# Patient Record
Sex: Female | Born: 1965 | Race: White | Hispanic: No | Marital: Married | Smoking: Former smoker
Health system: Southern US, Community
[De-identification: ages and names within clinical notes are randomized; demographics above are authoritative.]

## PROBLEM LIST (undated history)

## (undated) DIAGNOSIS — I499 Cardiac arrhythmia, unspecified: Secondary | ICD-10-CM

## (undated) DIAGNOSIS — Z87442 Personal history of urinary calculi: Secondary | ICD-10-CM

## (undated) DIAGNOSIS — K449 Diaphragmatic hernia without obstruction or gangrene: Secondary | ICD-10-CM

## (undated) DIAGNOSIS — F3289 Other specified depressive episodes: Secondary | ICD-10-CM

## (undated) DIAGNOSIS — F329 Major depressive disorder, single episode, unspecified: Secondary | ICD-10-CM

## (undated) DIAGNOSIS — K209 Esophagitis, unspecified: Secondary | ICD-10-CM

## (undated) DIAGNOSIS — I10 Essential (primary) hypertension: Secondary | ICD-10-CM

## (undated) DIAGNOSIS — E7800 Pure hypercholesterolemia, unspecified: Secondary | ICD-10-CM

## (undated) DIAGNOSIS — IMO0002 Reserved for concepts with insufficient information to code with codable children: Secondary | ICD-10-CM

## (undated) DIAGNOSIS — K5730 Diverticulosis of large intestine without perforation or abscess without bleeding: Secondary | ICD-10-CM

## (undated) DIAGNOSIS — K219 Gastro-esophageal reflux disease without esophagitis: Secondary | ICD-10-CM

## (undated) DIAGNOSIS — C50919 Malignant neoplasm of unspecified site of unspecified female breast: Secondary | ICD-10-CM

## (undated) DIAGNOSIS — L659 Nonscarring hair loss, unspecified: Secondary | ICD-10-CM

## (undated) DIAGNOSIS — D649 Anemia, unspecified: Secondary | ICD-10-CM

## (undated) DIAGNOSIS — F1111 Opioid abuse, in remission: Secondary | ICD-10-CM

## (undated) DIAGNOSIS — F411 Generalized anxiety disorder: Secondary | ICD-10-CM

## (undated) DIAGNOSIS — G43909 Migraine, unspecified, not intractable, without status migrainosus: Secondary | ICD-10-CM

## (undated) HISTORY — DX: Nonscarring hair loss, unspecified: L65.9

## (undated) HISTORY — DX: Diverticulosis of large intestine without perforation or abscess without bleeding: K57.30

## (undated) HISTORY — DX: Esophagitis, unspecified: K20.9

## (undated) HISTORY — DX: Anemia, unspecified: D64.9

## (undated) HISTORY — PX: TONSILLECTOMY: SUR1361

## (undated) HISTORY — DX: Diaphragmatic hernia without obstruction or gangrene: K44.9

## (undated) HISTORY — DX: Major depressive disorder, single episode, unspecified: F32.9

## (undated) HISTORY — DX: Generalized anxiety disorder: F41.1

## (undated) HISTORY — DX: Other specified depressive episodes: F32.89

## (undated) HISTORY — DX: Gastro-esophageal reflux disease without esophagitis: K21.9

## (undated) HISTORY — PX: KNEE SURGERY: SHX244

## (undated) HISTORY — DX: Opioid abuse, in remission: F11.11

## (undated) HISTORY — PX: ABLATION: SHX5711

## (undated) SURGERY — Surgical Case
Anesthesia: *Unknown

---
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
## 1996-04-10 DIAGNOSIS — K449 Diaphragmatic hernia without obstruction or gangrene: Secondary | ICD-10-CM

## 1996-04-10 DIAGNOSIS — K209 Esophagitis, unspecified without bleeding: Secondary | ICD-10-CM

## 1996-04-10 HISTORY — DX: Diaphragmatic hernia without obstruction or gangrene: K44.9

## 1996-04-10 HISTORY — DX: Esophagitis, unspecified without bleeding: K20.90

## 1999-07-04 ENCOUNTER — Encounter (INDEPENDENT_AMBULATORY_CARE_PROVIDER_SITE_OTHER): Payer: Self-pay

## 1999-07-04 ENCOUNTER — Other Ambulatory Visit: Admission: RE | Admit: 1999-07-04 | Discharge: 1999-07-04 | Payer: Self-pay | Admitting: Gastroenterology

## 2007-10-11 ENCOUNTER — Emergency Department (HOSPITAL_COMMUNITY): Admission: EM | Admit: 2007-10-11 | Discharge: 2007-10-11 | Payer: Self-pay | Admitting: Emergency Medicine

## 2008-12-07 ENCOUNTER — Ambulatory Visit (HOSPITAL_COMMUNITY): Admission: RE | Admit: 2008-12-07 | Discharge: 2008-12-07 | Payer: Self-pay | Admitting: Internal Medicine

## 2009-04-10 HISTORY — PX: CARDIAC CATHETERIZATION: SHX172

## 2009-05-13 ENCOUNTER — Encounter (INDEPENDENT_AMBULATORY_CARE_PROVIDER_SITE_OTHER): Payer: Self-pay | Admitting: *Deleted

## 2009-06-15 ENCOUNTER — Encounter (INDEPENDENT_AMBULATORY_CARE_PROVIDER_SITE_OTHER): Payer: Self-pay | Admitting: *Deleted

## 2009-06-15 ENCOUNTER — Ambulatory Visit: Payer: Self-pay | Admitting: Gastroenterology

## 2009-06-15 DIAGNOSIS — K589 Irritable bowel syndrome without diarrhea: Secondary | ICD-10-CM | POA: Insufficient documentation

## 2009-06-15 DIAGNOSIS — K219 Gastro-esophageal reflux disease without esophagitis: Secondary | ICD-10-CM | POA: Insufficient documentation

## 2009-06-15 DIAGNOSIS — F329 Major depressive disorder, single episode, unspecified: Secondary | ICD-10-CM | POA: Insufficient documentation

## 2009-06-15 DIAGNOSIS — K625 Hemorrhage of anus and rectum: Secondary | ICD-10-CM | POA: Insufficient documentation

## 2009-06-15 LAB — CONVERTED CEMR LAB: Tissue Transglutaminase Ab, IgA: 0.2 units (ref <7)

## 2009-06-16 LAB — CONVERTED CEMR LAB
ALT: 16 units/L (ref 0–35)
AST: 23 units/L (ref 0–37)
Albumin: 4.5 g/dL (ref 3.5–5.2)
Alkaline Phosphatase: 55 units/L (ref 39–117)
Basophils Absolute: 0 10*3/uL (ref 0.0–0.1)
Basophils Relative: 0 % (ref 0.0–3.0)
Bilirubin, Direct: 0.1 mg/dL (ref 0.0–0.3)
Eosinophils Absolute: 0.1 10*3/uL (ref 0.0–0.7)
Eosinophils Relative: 0.8 % (ref 0.0–5.0)
Ferritin: 21.1 ng/mL (ref 10.0–291.0)
Folate: 19.5 ng/mL
HCT: 43.6 % (ref 36.0–46.0)
Hemoglobin: 14.7 g/dL (ref 12.0–15.0)
IgA: 200 mg/dL (ref 68–378)
Iron: 55 ug/dL (ref 42–145)
Lymphocytes Relative: 22.4 % (ref 12.0–46.0)
Lymphs Abs: 2.6 10*3/uL (ref 0.7–4.0)
MCHC: 33.6 g/dL (ref 30.0–36.0)
MCV: 95.1 fL (ref 78.0–100.0)
Monocytes Absolute: 0.5 10*3/uL (ref 0.1–1.0)
Monocytes Relative: 4.7 % (ref 3.0–12.0)
Neutro Abs: 8.4 10*3/uL — ABNORMAL HIGH (ref 1.4–7.7)
Neutrophils Relative %: 72.1 % (ref 43.0–77.0)
Platelets: 260 10*3/uL (ref 150.0–400.0)
RBC: 4.59 M/uL (ref 3.87–5.11)
RDW: 12.3 % (ref 11.5–14.6)
Saturation Ratios: 12.2 % — ABNORMAL LOW (ref 20.0–50.0)
TSH: 1.6 microintl units/mL (ref 0.35–5.50)
Total Bilirubin: 0.4 mg/dL (ref 0.3–1.2)
Total Protein: 8 g/dL (ref 6.0–8.3)
Transferrin: 322.8 mg/dL (ref 212.0–360.0)
Vitamin B-12: 706 pg/mL (ref 211–911)
WBC: 11.6 10*3/uL — ABNORMAL HIGH (ref 4.5–10.5)

## 2009-06-24 ENCOUNTER — Telehealth: Payer: Self-pay | Admitting: Gastroenterology

## 2009-07-02 ENCOUNTER — Telehealth: Payer: Self-pay | Admitting: Gastroenterology

## 2009-07-02 ENCOUNTER — Ambulatory Visit: Payer: Self-pay | Admitting: Gastroenterology

## 2009-07-06 ENCOUNTER — Encounter: Payer: Self-pay | Admitting: Gastroenterology

## 2009-07-21 ENCOUNTER — Telehealth: Payer: Self-pay | Admitting: Gastroenterology

## 2009-08-02 ENCOUNTER — Encounter: Admission: RE | Admit: 2009-08-02 | Discharge: 2009-08-02 | Payer: Self-pay | Admitting: Obstetrics and Gynecology

## 2009-09-14 ENCOUNTER — Telehealth: Payer: Self-pay | Admitting: Gastroenterology

## 2009-09-22 ENCOUNTER — Emergency Department (HOSPITAL_COMMUNITY): Admission: EM | Admit: 2009-09-22 | Discharge: 2009-09-22 | Payer: Self-pay | Admitting: Emergency Medicine

## 2009-10-18 ENCOUNTER — Telehealth: Payer: Self-pay | Admitting: Gastroenterology

## 2009-10-20 DIAGNOSIS — D509 Iron deficiency anemia, unspecified: Secondary | ICD-10-CM | POA: Insufficient documentation

## 2009-10-22 ENCOUNTER — Encounter: Payer: Self-pay | Admitting: Gastroenterology

## 2009-10-22 ENCOUNTER — Ambulatory Visit (HOSPITAL_COMMUNITY): Admission: RE | Admit: 2009-10-22 | Discharge: 2009-10-22 | Payer: Self-pay | Admitting: Family Medicine

## 2009-10-29 ENCOUNTER — Telehealth: Payer: Self-pay | Admitting: Gastroenterology

## 2009-11-30 ENCOUNTER — Telehealth: Payer: Self-pay | Admitting: Gastroenterology

## 2009-12-01 ENCOUNTER — Telehealth: Payer: Self-pay | Admitting: Gastroenterology

## 2010-03-17 ENCOUNTER — Observation Stay (HOSPITAL_COMMUNITY): Admission: EM | Admit: 2010-03-17 | Discharge: 2010-02-17 | Payer: Self-pay | Admitting: Cardiovascular Disease

## 2010-04-07 ENCOUNTER — Telehealth: Payer: Self-pay | Admitting: Gastroenterology

## 2010-05-01 ENCOUNTER — Encounter: Payer: Self-pay | Admitting: Obstetrics and Gynecology

## 2010-05-10 NOTE — Procedures (Signed)
Summary: Colonoscopy  Patient: [name] Note: All result statuses are Final unless otherwise noted.  Tests: (1) Colonoscopy (COL)   COL Colonoscopy           DONE     [facility]     [address]     [place], [place]  [address]           COLONOSCOPY PROCEDURE REPORT           PATIENT:  Jasmine Allen, Jasmine Allen  MR#:  [id number]     BIRTHDATE:  April 10, 1966, 43 yrs. old  GENDER:  female     ENDOSCOPIST:  Vania Rea. Jarold Motto, MD, [facility]     REF. BY:     PROCEDURE DATE:  07/02/2009     PROCEDURE:  Colonoscopy with biopsy     ASA CLASS:  Class II     INDICATIONS:  unexplained diarrhea     MEDICATIONS:   Fentanyl 100 mcg IV, Versed 13 mg IV, Benadryl 25     mg IV           DESCRIPTION OF PROCEDURE:   After the risks benefits and     alternatives of the procedure were thoroughly explained, informed     consent was obtained.  Digital rectal exam was performed and     revealed no abnormalities.   The LB [id number] endoscope     was introduced through the anus and advanced to the terminal ileum     which was intubated for a short distance, IN FUTURE WOULD NEED     PROPOFOL FOR IV SEDATION.  The quality of the prep was excellent,     using MoviPrep.  The instrument was then slowly withdrawn as the     colon was fully examined.     <<PROCEDUREIMAGES>>           FINDINGS:  No polyps or cancers were seen.  This was otherwise a     normal examination of the colon. random biopsies done.     Retroflexed views in the rectum revealed no abnormalities.    The     scope was then withdrawn from the patient and the procedure     completed.           COMPLICATIONS:  None     ENDOSCOPIC IMPRESSION:     1) No polyps or cancers     2) Otherwise normal examination     PROBABLE IBS.     RECOMMENDATIONS:     1) Await biopsy results     LIBRAX 1 TID,AC.#100 REFILL X 6.     REPEAT EXAM:  No           ______________________________     Vania Rea. Jarold Motto, MD, [facility] Graham           CC:  Patrica Duel, MD           n.     Rosalie Doctor:   Vania Rea. Patterson at 07/02/2009 03:19 PM           Anitra Lauth, [id number]  Note: An exclamation mark (!) indicates a result that was not dispersed into the flowsheet. Document Creation Date: 07/02/2009 3:20 PM _______________________________________________________________________  (1) Order result status: Final Collection or observation date-time: 07/02/2009 15:12 Requested date-time:  Receipt date-time:  Reported date-time:  Referring Physician:   Ordering Physician: Sheryn Bison [phone]) Specimen Source:  Source: Launa Grill Order Number: [id number] Lab site:

## 2010-05-10 NOTE — Progress Notes (Signed)
Summary: ? re labs  Phone Note Call from Patient Call back at [phone] cell   Caller: Patient Call For: Jarold Motto Reason for Call: Talk to Nurse Summary of Call: Patient has questions on when she should have labs done Initial call taken by: Tawni Levy,  July 21, 2009 2:02 PM  Follow-up for Phone Call        Pt on tandem for 3 mo.  Started on 06/15/09.  See lab report.  Asking when she should repeat lab? Follow-up by: Ashok Cordia RN,  July 21, 2009 3:04 PM  Additional Follow-up for Phone Call Additional follow up Details #1::        CONTINUE 3 MORE MOS AND THEN REPEAT CBC AND IRON LEVELS... Additional Follow-up by: Mardella Layman MD FACG,  July 21, 2009 3:11 PM    Additional Follow-up for Phone Call Additional follow up Details #2::    LM for pt to call.  Lupita Leash Surface RN  July 21, 2009 3:14 PM  Pt notified.  Did not start iron until end of March.  will come in July for repeat level. APpt in IDX. Follow-up by: Ashok Cordia RN,  July 21, 2009 3:33 PM

## 2010-05-10 NOTE — Miscellaneous (Signed)
Summary: librax rx.  Clinical Lists Changes  Medications: Added new medication of LIBRAX 2.5-5 MG  CAPS (CLIDINIUM-CHLORDIAZEPOXIDE) take one tab three tmes a day, ac. - Signed Rx of LIBRAX 2.5-5 MG  CAPS (CLIDINIUM-CHLORDIAZEPOXIDE) take one tab three tmes a day, ac.;  #100 x 6;  Signed;  Entered by: Darlyn Read RN;  Authorized by: Mardella Layman MD [facility];  Method used: Electronically to [facility] [address], [place], [place], [place]  [address], Ph: [phone], Fax: [phone]    Prescriptions: LIBRAX 2.5-5 MG  CAPS (CLIDINIUM-CHLORDIAZEPOXIDE) take one tab three tmes a day, ac.  #100 x 6   Entered by:   Darlyn Read RN   Authorized by:   Mardella Layman MD [facility]   Signed by:   Darlyn Read RN on 07/02/2009   Method used:   Electronically to        [facility] [address] (retail)       [address]       [place], [place]  [address]       Ph: [phone]       Fax: [phone]   RxID:   [id number]

## 2010-05-10 NOTE — Progress Notes (Signed)
Summary: results request  Phone Note Call from Patient Call back at [phone]   Caller: Patient Call For: Dr. Jarold Motto Reason for Call: Talk to Nurse Summary of Call: would like labwork results regarding iron levels Initial call taken by: Vallarie Mare,  October 29, 2009 9:48 AM  Follow-up for Phone Call        Pt asking if we have recieved her lab results yet.  Labs were drawn last week at [facility] in [place]. Follow-up by: Ashok Cordia RN,  October 29, 2009 2:28 PM  Additional Follow-up for Phone Call Additional follow up Details #1::        See labs.  Pt asking if she needs to cont Iron? Additional Follow-up by: Ashok Cordia RN,  October 29, 2009 4:18 PM    Additional Follow-up for Phone Call Additional follow up Details #2::    STOP Follow-up by: Mardella Layman MD [facility] Graham,  October 29, 2009 4:20 PM  Additional Follow-up for Phone Call Additional follow up Details #3:: Details for Additional Follow-up Action Taken: Pt notified.   Additional Follow-up by: Ashok Cordia RN,  October 29, 2009 4:54 PM

## 2010-05-10 NOTE — Progress Notes (Signed)
Summary: ? re prep  Phone Note Call from Patient Call back at [phone]--- [phone]   Caller: Patient Call For: Jarold Motto Reason for Call: Talk to Nurse Summary of Call: Patient has questions regarding her prep before procedure Initial call taken by: Tawni Levy,  June 24, 2009 8:09 AM  Follow-up for Phone Call        Answered pt's questions re prep. Follow-up by: Ashok Cordia RN,  June 24, 2009 9:04 AM

## 2010-05-10 NOTE — Progress Notes (Signed)
Summary: speak to nurse  Phone Note Call from Patient Call back at Home Phone [phone] Call back at cell [phone]   Caller: Patient Call For: Jasmine Allen Reason for Call: Talk to Nurse Summary of Call: Patient wants to speak to nurse regarding esophageal spasms that she's having. Initial call taken by: Tawni Levy,  September 14, 2009 8:12 AM  Follow-up for Phone Call        Pt currently taking Doxycycline for cervical inflamation.  Since starting this she is having esophageal spasms, painful swallowing.  Once before pt had similiar symptoms while taking antiobiotics ans Dr. Jarold Allen gave Levsin which was helpful.  Pt asking for Rx for levsin or what ever Dr. Jarold Allen feels would be best.  She is to take Doxy for 10 days total. Follow-up by: Ashok Cordia RN,  September 14, 2009 8:31 AM  Additional Follow-up for Phone Call Additional follow up Details #1::        Pt notified. Additional Follow-up by: Ashok Cordia RN,  September 14, 2009 3:36 PM    Additional Follow-up for Phone Call Additional follow up Details #2::    levsin ok and Dukes mmw tid Follow-up by: Mardella Layman MD [facility],  September 14, 2009 1:13 PM  New/Updated Medications: LEVSIN/SL 0.125 MG  SUBL (HYOSCYAMINE SULFATE) 1 sl q 4-6 hrs as needed FIRST-BXN MOUTHWASH  SUSP (DIPHENHYD-LIDOCAINE-NYSTATIN) 1 tsp swish and swallow qid Prescriptions: FIRST-BXN MOUTHWASH  SUSP (DIPHENHYD-LIDOCAINE-NYSTATIN) 1 tsp swish and swallow qid  #14 oz x 1   Entered by:   Ashok Cordia RN   Authorized by:   Mardella Layman MD [facility]   Signed by:   Ashok Cordia RN on 09/14/2009   Method used:   Electronically to        [facility] [address] (retail)       [address]       [place], [place]  [address]       Ph: [phone]       Fax: [phone]   RxID:   [id number] LEVSIN/SL 0.125 MG  SUBL (HYOSCYAMINE SULFATE) 1 sl q 4-6 hrs as needed  #60 x 1   Entered by:   Ashok Cordia RN   Authorized by:    Mardella Layman MD [facility]   Signed by:   Ashok Cordia RN on 09/14/2009   Method used:   Electronically to        [facility] [address] (retail)       [address]       [place], [place]  [address]       Ph: [phone]       Fax: [phone]   RxID:   [id number]

## 2010-05-10 NOTE — Letter (Signed)
Summary: Patient Notice- Colon Biospy Results  [facility]  [address] [place], [place] [address]   Phone: [phone]  Fax: [phone]        July 06, 2009 MRN: [id number]    [facility] [address] [place], [place]  [address]    Dear Ms. BREUER,  I am pleased to inform you that the biopsies taken during your recent colonoscopy did not show any evidence of cancer upon pathologic examination.  Additional information/recommendations:  __No further action is needed at this time.  Please follow-up with      your primary care physician for your other healthcare needs.  __Please call [phone] to schedule a return visit to review      your condition.  xx__Continue with the treatment plan as outlined on the day of your      exam.  __You should have a repeat colonoscopy examination for this problem           in _ years.  Please call us if you are having persistent problems or have questions about your condition that have not been fully answered at this time.  Sincerely,  Mardella Layman MD [facility]   This letter has been electronically signed by your physician.  Appended Document: Patient Notice- Colon Biospy Results letter mailed 4.1.11

## 2010-05-10 NOTE — Letter (Signed)
Summary: [facility] Instructions  [facility]  [address] [place], [place] [address]   Phone: [phone]  Fax: [phone]       CLOVIS WARWICK    1966/01/08    MRN: [id number]        Procedure Day /Date: Friday, 07/02/09     Arrival Time: 2:00      Procedure Time: 3:00     Location of Procedure:                    [facility] (4th Floor)                          PREPARATION FOR COLONOSCOPY WITH MOVIPREP   Starting 5 days prior to your procedure 06/27/09 do not eat nuts, seeds, popcorn, corn, beans, peas,  salads, or any raw vegetables.  Do not take any fiber supplements (e.g. Metamucil, Citrucel, and Benefiber).  THE DAY BEFORE YOUR PROCEDURE         DATE: 07/01/09   DAY: Thursday  1.  Drink clear liquids the entire day-NO SOLID FOOD  2.  Do not drink anything colored red or purple.  Avoid juices with pulp.  No orange juice.  3.  Drink at least 64 oz. (8 glasses) of fluid/clear liquids during the day to prevent dehydration and help the prep work efficiently.  CLEAR LIQUIDS INCLUDE: Water Jello Ice Popsicles Tea (sugar ok, no milk/cream) Powdered fruit flavored drinks Coffee (sugar ok, no milk/cream) Gatorade Juice: apple, white grape, white cranberry  Lemonade Clear bullion, consomm, broth Carbonated beverages (any kind) Strained chicken noodle soup Hard Candy                             4.  In the morning, mix first dose of MoviPrep solution:    Empty 1 Pouch A and 1 Pouch B into the disposable container    Add lukewarm drinking water to the top line of the container. Mix to dissolve    Refrigerate (mixed solution should be used within 24 hrs)  5.  Begin drinking the prep at 5:00 p.m. The MoviPrep container is divided by 4 marks.   Every 15 minutes drink the solution down to the next mark (approximately 8 oz) until the full liter is complete.   6.  Follow completed prep with 16 oz of clear liquid of your choice (Nothing  red or purple).  Continue to drink clear liquids until bedtime.  7.  Before going to bed, mix second dose of MoviPrep solution:    Empty 1 Pouch A and 1 Pouch B into the disposable container    Add lukewarm drinking water to the top line of the container. Mix to dissolve    Refrigerate  THE DAY OF YOUR PROCEDURE      DATE: 07/02/09  DAY: Friday  Beginning at 10:00 a.m. (5 hours before procedure):         1. Every 15 minutes, drink the solution down to the next mark (approx 8 oz) until the full liter is complete.  2. Follow completed prep with 16 oz. of clear liquid of your choice.    3. You may drink clear liquids until 1:00  (2 HOURS BEFORE PROCEDURE).   MEDICATION INSTRUCTIONS  Unless otherwise instructed, you should take regular prescription medications with a small sip of water   as early as possible  the morning of your procedure.       .          OTHER INSTRUCTIONS  You will need a responsible adult at least 43 years of age to accompany you and drive you home.   This person must remain in the waiting room during your procedure.  Wear loose fitting clothing that is easily removed.  Leave jewelry and other valuables at home.  However, you may wish to bring a book to read or  an iPod/MP3 player to listen to music as you wait for your procedure to start.  Remove all body piercing jewelry and leave at home.  Total time from sign-in until discharge is approximately 2-3 hours.  You should go home directly after your procedure and rest.  You can resume normal activities the  day after your procedure.  The day of your procedure you should not:   Drive   Make legal decisions   Operate machinery   Drink alcohol   Return to work  You will receive specific instructions about eating, activities and medications before you leave.    The above instructions have been reviewed and explained to me by   _______________________    I fully understand and can  verbalize these instructions _____________________________ Date _________

## 2010-05-10 NOTE — Assessment & Plan Note (Signed)
Summary: rectal bleeding...as.   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 She now presents with irritable bowel type complaints with alternating diarrhea and constipation, gas, bloating, and intermittent rectal bleeding. She denies upper GI complaints on daily Nexium. She has long history of anxiety and depression is on Zoloft 75 mg a day and p.r.n. Klonopin. 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  She's had a symptomatic rectal bleeding unresponsive to Preparation H and Tucks pads. Family history noncontributory except for father who may have had colon polyps. She has not had previous colonoscopy. She denies any specific food intolerances. Her appetite is good and her weight is been stable.   GI Review of Systems    Reports acid reflux and  bloating.      Denies abdominal pain, belching, chest pain, dysphagia with liquids, dysphagia with solids, heartburn, loss of appetite, nausea, vomiting, vomiting blood, weight loss, and  weight gain.      Reports change in bowel habits, hemorrhoids, rectal bleeding, and  rectal pain.     Denies anal fissure, black tarry stools, constipation, diarrhea, diverticulosis, fecal incontinence, heme positive stool, irritable bowel syndrome, jaundice, light color stool, and  liver problems. Preventive Screening-Counseling & Management  Alcohol-Tobacco     Smoking Status: current      Drug Use:  no.      Current Medications (verified): 1)  Pravachol 40 Mg Tabs (Pravastatin Sodium) .... Once Daily 2)  Zoloft 50 Mg Tabs (Sertraline Hcl) .... Take 1 1/2 Tablet By Mouth Once Daily 3)  Nexium 40  Mg Cpdr (Esomeprazole Magnesium) .... Once Daily 4)  Zyrtec Allergy 10 Mg Caps (Cetirizine Hcl) .... Once Daily 5)  Hrt 0.2mg  .... Once Daily 6)  Klonopin 1 Mg Tabs (Clonazepam) .... As Needed 7)  Mobic 15 Mg Tabs (Meloxicam) .... Once Daily 8)  Anusol-Hc 25 Mg Supp (Hydrocortisone Acetate) .... As Needed  Allergies (verified): 1)  ! Reglan 2)  ! Augmentin 3)  ! Sulfa 4)  ! Erythromycin  Past History:  Past medical, surgical, family and social histories (including risk factors) reviewed for relevance to current acute and chronic problems.  Past Medical History: Anemia Anxiety Disorder Chronic Headaches Depression Esophageal Stricture GERD Hyperlipidemia Hypertension Kidney Stones Pneumonia  Past Surgical History: Tonsillectomy  Family History: Reviewed history and no changes required. Family History of Breast Cancer:Mother Family History of Heart Disease: Father  Social History: Reviewed history and no changes required. Occupation: CMA Patient currently smokes.  Alcohol Use - no Daily Caffeine Use Illicit Drug Use - no Smoking Status:  current Drug Use:  no  Review of Systems       The patient complains of allergy/sinus, anxiety-new, arthritis/joint pain, depression-new, headaches-new, night sweats, sore throat, and voice change.  The patient denies anemia, back pain, blood in urine, breast changes/lumps, change in vision, confusion, cough, coughing up blood, fainting, fatigue, fever, hearing problems, heart murmur, heart rhythm changes, itching, menstrual pain, muscle pains/cramps, nosebleeds, pregnancy symptoms, shortness of breath, skin rash, sleeping problems, swelling of feet/legs, swollen lymph glands, thirst - excessive , urination - excessive , urination changes/pain, urine leakage, and vision changes.    Vital Signs:  Patient profile:   45 year old female Height:      60 inches Weight:      114.13 pounds BMI:     22.37 Pulse rate:   88 / minute Pulse  rhythm:   regular BP sitting:   112 / 80  (right arm) Cuff size:   regular  Vitals Entered By: June McMurray CMA Duncan Dull) (June 15, 2009 2:10 PM)  Physical Exam  General:  Well developed, well nourished, no acute distress.healthy appearing.   Head:  Normocephalic and atraumatic. Eyes:  PERRLA, no icterus.exam deferred to patient's ophthalmologist.   Neck:  Supple; no masses or thyromegaly. Lungs:  Clear throughout to auscultation. Heart:  Regular rate and rhythm; no murmurs, rubs,  or bruits. Abdomen:  Soft, nontender and nondistended. No masses, hepatosplenomegaly or hernias noted. Normal bowel sounds. Rectal:  External noninflamed hemorrhoidal tissue noted without fissures or fistulae. No rectal masses or tenderness with solid stool in the rectal vault is guaiac negative. Msk:  Symmetrical with no gross deformities. Normal posture. Pulses:  Normal pulses noted. Extremities:  No clubbing, cyanosis, edema or deformities noted. Neurologic:  Alert and  oriented x4;  grossly normal neurologically. Skin:  Intact without significant lesions or rashes. Cervical Nodes:  No significant cervical adenopathy. Inguinal Nodes:  No significant inguinal adenopathy. Psych:  Alert and cooperative. Normal mood and affect.   Impression & Recommendations:  Problem # 1:  GERD (ICD-530.81) Assessment Improved Continue reflex seen a daily PPI therapy.  Problem # 2:  IRRITABLE BOWEL SYNDROME (ICD-564.1) Assessment: New Colonoscopy to exclude inflammatory bowel disease has been scheduled. I placed her on Canasa  1 g suppositories at bedtime with b.i.d.Analmantle cream and [facility] tabs b.i.d. Screening labs have also been ordered for review. This will include celiac antibodies.  Problem # 3:  DEPRESSION (ICD-311) Assessment: Improved continue current medications per Dr. Nobie Putnam her primary care physician.  Problem # 4:  RECTAL BLEEDING (ICD-569.3) Assessment: Improved  care for mixed  hemorrhoids as mentioned above.  Orders: TLB-CBC Platelet - w/Differential (85025-CBCD) TLB-Hepatic/Liver Function Pnl (80076-HEPATIC) TLB-TSH (Thyroid Stimulating Hormone) (84443-TSH) TLB-B12, Serum-Total ONLY ([address]-U04) TLB-Ferritin (82728-FER) TLB-Folic Acid (Folate) (82746-FOL) TLB-IBC Pnl (Iron/FE;Transferrin) (83550-IBC) TLB-IgA (Immunoglobulin A) (82784-IGA) T-Sprue Panel (Celiac Disease Aby Eval) (83516x3/[address])  Patient Instructions: 1)  Copy sent to : Dr. Patrica Duel 2)  Please continue current medications.  3)  Constipation and Hemorrhoids brochure given.  4)  Colonoscopy and Flexible Sigmoidoscopy brochure given.  5)  Conscious Sedation brochure given.  6)  Local hemorrhoid creams and suppositories 7)  Probiotic trial. 8)  The medication list was reviewed and reconciled.  All changed / newly prescribed medications were explained.  A complete medication list was provided to the patient / caregiver.  Appended Document: rectal bleeding...as.    Clinical Lists Changes  Medications: Added new medication of MOVIPREP 100 GM  SOLR (PEG-KCL-NACL-NASULF-NA ASC-C) As per prep instructions. - Signed Added new medication of CANASA 1000 MG  SUPP (MESALAMINE) 1 q hs - Signed Added new medication of ANAMANTLE HC 3-0.5 %  CREA (LIDOCAINE-HYDROCORTISONE ACE) Apply two times a day - Signed Added new medication of [facility]ON [facility]ALTH  CAPS (PROBIOTIC PRODUCT) two times a day Rx of MOVIPREP 100 GM  SOLR (PEG-KCL-NACL-NASULF-NA ASC-C) As per prep instructions.;  #1 x 0;  Signed;  Entered by: Ashok Cordia RN;  Authorized by: Mardella Layman MD [facility];  Method used: Electronically to [facility] [address] [place], [place],  [place]  [address], Ph: [phone], Fax: [phone] Rx of CANASA 1000 MG  SUPP (MESALAMINE) 1 q hs;  #30 x 1;  Signed;  Entered by: Ashok Cordia RN;  Authorized by: Mardella Layman MD [facility];  Method used: Electronically to [facility] [address] [address] [place], [place], [place]  [address], Ph: [phone], Fax: [phone] Rx of ANAMANTLE HC 3-0.5 %  CREA (LIDOCAINE-HYDROCORTISONE ACE) Apply two times a day;  #30 gm x 1;  Signed;  Entered by: Ashok Cordia RN;  Authorized by: Mardella Layman MD [facility];  Method used: Electronically to [facility] [address] [address] [place], [place], [place]  [address], Ph: [phone], Fax: [phone] Orders: Added new Test order of Colonoscopy (Colon) - Signed    Prescriptions: ANAMANTLE HC 3-0.5 %  CREA (LIDOCAINE-HYDROCORTISONE ACE) Apply two times a day  #30 gm x 1   Entered by:   Ashok Cordia RN   Authorized by:   Mardella Layman MD [facility]   Signed by:   Ashok Cordia RN on 06/15/2009   Method used:   Electronically to        [facility] [address] (retail)       [address]       [place], [place]  [address]       Ph: [phone]       Fax: [phone]   RxID:   [id number] CANASA 1000 MG  SUPP (MESALAMINE) 1 q hs  #30 x 1   Entered by:   Ashok Cordia RN   Authorized by:   Mardella Layman MD [facility]   Signed by:   Ashok Cordia RN on 06/15/2009   Method used:   Electronically to        [facility] [address] (retail)       [address]       [place], [place]  [address]       Ph: [phone]       Fax: [phone]   RxID:   [id number] MOVIPREP 100 GM  SOLR (PEG-KCL-NACL-NASULF-NA ASC-C) As per prep instructions.  #1 x 0   Entered by:   Ashok Cordia RN   Authorized by:   Mardella Layman MD [facility]   Signed by:   Ashok Cordia RN on 06/15/2009   Method used:   Electronically to        [facility] [address] (retail)       [address]       [place], [place]  [address]       Ph: [phone]       Fax: [phone]   RxID:   [id number]

## 2010-05-10 NOTE — Progress Notes (Signed)
Summary: Triage  Phone Note Call from Patient Call back at Home Phone [phone]   Caller: Patient Call For: Dr. Jarold Motto Reason for Call: Talk to Nurse Summary of Call: Pt. is still constantly having BM this morning without the morning prep and it is causing pain--wants to know if she should continue w/prep Initial call taken by: Karna Christmas,  July 02, 2009 8:05 AM  Follow-up for Phone Call        Patient told to complete prep as directed-- she states she has finished the second container and is in the bath room constantly and her bottom is sore.. she is concerned she will have to use the bathroom on the way here from [place]. told her it was wise to complete her prep and she may not have to go as much at the time she leaves for the clinic since she has 3 hours to go and once here we can get her up as much as possible. told to use vaseline and or baby wipes with each bathroom use to help with her discomfort.  Follow-up by: Joylene John RN,  July 02, 2009 11:07 AM

## 2010-05-10 NOTE — Letter (Signed)
Summary: New Patient letter  [facility]  [address] [place], [place] [address]   Phone: [phone]  Fax: [phone]       05/13/2009 MRN: [id number]  [name] [address] [place], [place]  [address]  Dear Ms. Jasmine Allen,  Welcome to the Gastroenterology Division at [facility].    You are scheduled to see Dr. Jarold Motto on 06/08/2009 at 2:00PM on the 3rd floor at [facility], [address]  We ask that you try to arrive at our office 15 minutes prior to your appointment time to allow for check-in.  We would like you to complete the enclosed self-administered evaluation form prior to your visit and bring it with you on the day of your appointment.  We will review it with you.  Also, please bring a complete list of all your medications or, if you prefer, bring the medication bottles and we will list them.  Please bring your insurance card so that we may make a copy of it.  If your insurance requires a referral to see a specialist, please bring your referral form from your primary care physician.  Co-payments are due at the time of your visit and may be paid by cash, check or credit card.     Your office visit will consist of a consult with your physician (includes a physical exam), any laboratory testing he/she may order, scheduling of any necessary diagnostic testing (e.g. x-ray, ultrasound, CT-scan), and scheduling of a procedure (e.g. Endoscopy, Colonoscopy) if required.  Please allow enough time on your schedule to allow for any/all of these possibilities.    If you cannot keep your appointment, please call [phone] to cancel or reschedule prior to your appointment date.  This allows [place] the opportunity to schedule an appointment for another patient in need of care.  If you do not cancel or reschedule by 5 p.m. the business day prior to your appointment date, you will be charged a $50.00 late cancellation/no-show fee.    Thank you for choosing  [facility] for your medical needs.  We appreciate the opportunity to care for you.  Please visit [place] at our website  to learn more about our practice.                     Sincerely,                                                             The Gastroenterology Division

## 2010-05-10 NOTE — Progress Notes (Signed)
Summary: Discuss Gi symptoms  Phone Note Call from Patient Call back at Home Phone [phone]   Call For: Dr Jarold Motto Reason for Call: Talk to Nurse Summary of Call: Wants to run some GI symptoms by you and see what you think. Initial call taken by: Leanor Kail [facility],  November 30, 2009 1:13 PM  Follow-up for Phone Call        Pt had spell of abd cramping, diarrhea,  Felt dizzy at one point during this spell.  Abd feel slightly tender.  Pt is using librax and feels better today.  Pt asks if this could be IBS.  Pt reasurred that this could be IBS and the libras is treatment.  Pt instructed to call back if worsens. Follow-up by: Ashok Cordia RN,  November 30, 2009 4:34 PM

## 2010-05-10 NOTE — Progress Notes (Signed)
Summary: orders mailed to her.  Phone Note Call from Patient Call back at Home Phone [phone] Call back at [phone]   Caller: Patient Call For: Jarold Motto Reason for Call: Talk to Nurse Summary of Call: Patient wants to know if we can mail her the rx of what labs she is to have done next week because she [place] the corner from [facility] and she would need the dx written on the rx. Initial call taken by: Tawni Levy,  October 18, 2009 4:43 PM  Follow-up for Phone Call        Order mailed to pt to have CBC, IBC and Ferritin level drawn.  LM for pt to call if any questions. Follow-up by: Ashok Cordia RN,  October 20, 2009 9:16 AM  New Problems: ANEMIA, IRON DEFICIENCY (ICD-280.9)   New Problems: ANEMIA, IRON DEFICIENCY (ICD-280.9)

## 2010-05-10 NOTE — Progress Notes (Signed)
Summary: Triage  Phone Note Call from Patient   Caller: Patient Summary of Call: Pt LM on voice mail   call back at [phone] or [phone] Initial call taken by: Ashok Cordia RN,  December 01, 2009 2:08 PM  Follow-up for Phone Call        Talked with pt.  She cont's to have abd cramping.  Taking librax three times a day before meals.  She has only been doing this for 2 days.  Pt states she is some better today.  Will cont Librax a few more days then report back.  Follow-up by: Ashok Cordia RN,  December 01, 2009 2:36 PM

## 2010-05-12 NOTE — Progress Notes (Signed)
Summary: Triage  Phone Note Call from Patient Call back at [phone]   Caller: Patient Call For: Dr. Jarold Motto Reason for Call: Talk to Nurse Summary of Call: Feels like she is anemic and wants to have bloodwork done closer to home. at [facility] [phone]) Initial call taken by: Karna Christmas,  April 07, 2010 10:46 AM  Follow-up for Phone Call        Lmom for patient to call back. Patient's last labs per our records were from 10/22/09. Asked patient to call with her s&s and to discuss her reasons for feeling "anemic". Graciella Freer RN  April 07, 2010 2:34 PM   Spoke with patient who stated she has fatigue, her hair has been falling out and she gets dizzy when she stands up from a sitting position. Patient denies bleeding and doesn't have a period any longer. Patient stated she took the Tandem tabs until she ran out. Patient would like labs drawn to see if she is anemic and she would like to have them drawn near her residence. I explained Dr Jarold Motto is off and I will ask him on 04/12/10 if we can order the labs. Patient stated understanding. Follow-up by: Graciella Freer RN,  April 07, 2010 3:21 PM  Additional Follow-up for Phone Call Additional follow up Details #1::        SHE NEEDS OV... Additional Follow-up by: Mardella Layman MD Clementeen Graham,  April 13, 2010 11:30 AM    Additional Follow-up for Phone Call Additional follow up Details #2::    Spoke with patient to inform her sje needs to make an office visit per Dr Jarold Motto. Patient will call back when she gets her schedule. Graciella Freer RN  April 13, 2010 2:48 PM   Lmom for patient to return my call to schedule her appointment with Dr Jarold Motto. Graciella Freer RN  April 15, 2010 4:29 PM  Spoke with patient who stated she has an appointment on 04/21/10 for a CBC. She will have the lab results faxed to our office for Dr Jarold Motto to view. Follow-up by: Graciella Freer RN,  April 19, 2010 11:39 AM

## 2010-05-16 ENCOUNTER — Other Ambulatory Visit: Payer: Self-pay | Admitting: Dermatology

## 2010-06-09 ENCOUNTER — Emergency Department (HOSPITAL_COMMUNITY): Payer: No Typology Code available for payment source

## 2010-06-09 ENCOUNTER — Emergency Department (HOSPITAL_COMMUNITY)
Admission: EM | Admit: 2010-06-09 | Discharge: 2010-06-09 | Disposition: A | Discharge status: Home / Self Care | Payer: No Typology Code available for payment source | Attending: Emergency Medicine | Admitting: Emergency Medicine

## 2010-06-09 DIAGNOSIS — M549 Dorsalgia, unspecified: Secondary | ICD-10-CM | POA: Insufficient documentation

## 2010-06-21 ENCOUNTER — Ambulatory Visit
Admission: RE | Admit: 2010-06-21 | Discharge: 2010-06-21 | Disposition: A | Discharge status: Home / Self Care | Payer: 59 | Source: Ambulatory Visit | Attending: Family Medicine | Admitting: Family Medicine

## 2010-06-21 ENCOUNTER — Other Ambulatory Visit: Payer: Self-pay | Admitting: Family Medicine

## 2010-06-21 DIAGNOSIS — M25562 Pain in left knee: Secondary | ICD-10-CM

## 2010-06-21 LAB — BASIC METABOLIC PANEL
BUN: 9 mg/dL (ref 6–23)
BUN: 7 mg/dL (ref 6–23)
CO2: 31 mEq/L (ref 19–32)
CO2: 29 mEq/L (ref 19–32)
Calcium: 9.2 mg/dL (ref 8.4–10.5)
Calcium: 8.1 mg/dL — ABNORMAL LOW (ref 8.4–10.5)
Chloride: 99 mEq/L (ref 96–112)
Chloride: 107 mEq/L (ref 96–112)
Creatinine, Ser: 0.6 mg/dL (ref 0.4–1.2)
Creatinine, Ser: 0.59 mg/dL (ref 0.4–1.2)
GFR calc Af Amer: >60 mL/min (ref >60)
GFR calc Af Amer: >60 mL/min (ref >60)
GFR calc non Af Amer: >60 mL/min (ref >60)
GFR calc non Af Amer: >60 mL/min (ref >60)
Glucose, Bld: 84 mg/dL (ref 70–99)
Glucose, Bld: 98 mg/dL (ref 70–99)
Potassium: 3.2 mEq/L — ABNORMAL LOW (ref 3.5–5.1)
Potassium: 3.7 mEq/L (ref 3.5–5.1)
Sodium: 138 mEq/L (ref 135–145)
Sodium: 141 mEq/L (ref 135–145)

## 2010-06-21 LAB — HEPATIC FUNCTION PANEL
ALT: 11 U/L (ref 0–35)
AST: 18 U/L (ref 0–37)
Albumin: 3.1 g/dL — ABNORMAL LOW (ref 3.5–5.2)
Alkaline Phosphatase: 37 U/L — ABNORMAL LOW (ref 39–117)
Bilirubin, Direct: <0.1 mg/dL (ref 0.0–0.3)
Total Bilirubin: 0.3 mg/dL (ref 0.3–1.2)
Total Protein: 5.5 g/dL — ABNORMAL LOW (ref 6.0–8.3)

## 2010-06-21 LAB — POCT CARDIAC MARKERS
CKMB, poc: <1 ng/mL — ABNORMAL LOW (ref 1.0–8.0)
Myoglobin, poc: 33.7 ng/mL (ref 12–200)
Troponin i, poc: <0.05 ng/mL (ref 0.00–0.09)

## 2010-06-21 LAB — CBC
HCT: 38 % (ref 36.0–46.0)
HCT: 34.9 % — ABNORMAL LOW (ref 36.0–46.0)
HCT: 36.2 % (ref 36.0–46.0)
Hemoglobin: 12.9 g/dL (ref 12.0–15.0)
Hemoglobin: 11.6 g/dL — ABNORMAL LOW (ref 12.0–15.0)
Hemoglobin: 12.1 g/dL (ref 12.0–15.0)
MCH: 31.8 pg (ref 26.0–34.0)
MCH: 30.9 pg (ref 26.0–34.0)
MCH: 31.4 pg (ref 26.0–34.0)
MCHC: 33.9 g/dL (ref 30.0–36.0)
MCHC: 33.2 g/dL (ref 30.0–36.0)
MCHC: 33.4 g/dL (ref 30.0–36.0)
MCV: 94 fL (ref 78.0–100.0)
MCV: 92.3 fL (ref 78.0–100.0)
MCV: 94.3 fL (ref 78.0–100.0)
Platelets: 259 10*3/uL (ref 150–400)
Platelets: 218 10*3/uL (ref 150–400)
Platelets: 231 10*3/uL (ref 150–400)
RBC: 4.05 MIL/uL (ref 3.87–5.11)
RBC: 3.7 MIL/uL — ABNORMAL LOW (ref 3.87–5.11)
RBC: 3.92 MIL/uL (ref 3.87–5.11)
RDW: 12.9 % (ref 11.5–15.5)
RDW: 13 % (ref 11.5–15.5)
RDW: 13.1 % (ref 11.5–15.5)
WBC: 8.4 10*3/uL (ref 4.0–10.5)
WBC: 5 10*3/uL (ref 4.0–10.5)
WBC: 7.7 10*3/uL (ref 4.0–10.5)

## 2010-06-21 LAB — DIFFERENTIAL
Basophils Absolute: 0 10*3/uL (ref 0.0–0.1)
Basophils Relative: 1 % (ref 0–1)
Eosinophils Absolute: 0.3 10*3/uL (ref 0.0–0.7)
Eosinophils Relative: 3 % (ref 0–5)
Lymphocytes Relative: 38 % (ref 12–46)
Lymphs Abs: 3.2 10*3/uL (ref 0.7–4.0)
Monocytes Absolute: 0.5 10*3/uL (ref 0.1–1.0)
Monocytes Relative: 6 % (ref 3–12)
Neutro Abs: 4.4 10*3/uL (ref 1.7–7.7)
Neutrophils Relative %: 52 % (ref 43–77)

## 2010-06-21 LAB — D-DIMER, QUANTITATIVE (NOT AT ARMC): D-Dimer, Quant: 0.22 ug/mL-FEU (ref 0.00–0.48)

## 2010-06-21 LAB — LIPID PANEL
Cholesterol: 162 mg/dL (ref 0–200)
Cholesterol: 178 mg/dL (ref 0–200)
HDL: 59 mg/dL (ref >39)
HDL: 64 mg/dL (ref >39)
LDL Cholesterol: 100 mg/dL — ABNORMAL HIGH (ref 0–99)
LDL Cholesterol: 86 mg/dL (ref 0–99)
Total CHOL/HDL Ratio: 2.7 RATIO
Total CHOL/HDL Ratio: 2.8 RATIO
Triglycerides: 72 mg/dL (ref <150)
Triglycerides: 86 mg/dL (ref <150)
VLDL: 14 mg/dL (ref 0–40)
VLDL: 17 mg/dL (ref 0–40)

## 2010-06-21 LAB — PREGNANCY, URINE: Preg Test, Ur: NEGATIVE

## 2010-06-21 LAB — APTT: aPTT: >200 seconds (ref 24–37)

## 2010-06-21 LAB — PROTIME-INR
INR: 1.06 (ref 0.00–1.49)
Prothrombin Time: 14 seconds (ref 11.6–15.2)

## 2010-06-21 LAB — CARDIAC PANEL(CRET KIN+CKTOT+MB+TROPI)
CK, MB: 1.1 ng/mL (ref 0.3–4.0)
Relative Index: INVALID (ref 0.0–2.5)
Total CK: 65 U/L (ref 7–177)
Troponin I: 0.01 ng/mL (ref 0.00–0.06)

## 2010-06-21 LAB — HEPARIN LEVEL (UNFRACTIONATED): Heparin Unfractionated: 0.92 IU/mL — ABNORMAL HIGH (ref 0.30–0.70)

## 2010-10-24 ENCOUNTER — Ambulatory Visit (HOSPITAL_COMMUNITY)
Admission: RE | Admit: 2010-10-24 | Discharge: 2010-10-24 | Disposition: A | Discharge status: Home / Self Care | Payer: 59 | Source: Ambulatory Visit | Attending: Family Medicine | Admitting: Family Medicine

## 2010-10-24 ENCOUNTER — Other Ambulatory Visit (HOSPITAL_COMMUNITY): Payer: Self-pay | Admitting: Family Medicine

## 2010-10-24 DIAGNOSIS — R05 Cough: Secondary | ICD-10-CM

## 2010-10-24 DIAGNOSIS — R0789 Other chest pain: Secondary | ICD-10-CM | POA: Insufficient documentation

## 2010-10-24 DIAGNOSIS — R059 Cough, unspecified: Secondary | ICD-10-CM | POA: Insufficient documentation

## 2010-10-24 DIAGNOSIS — R0602 Shortness of breath: Secondary | ICD-10-CM | POA: Insufficient documentation

## 2010-10-24 DIAGNOSIS — J069 Acute upper respiratory infection, unspecified: Secondary | ICD-10-CM

## 2010-12-09 ENCOUNTER — Ambulatory Visit (INDEPENDENT_AMBULATORY_CARE_PROVIDER_SITE_OTHER): Payer: 59 | Admitting: Urology

## 2010-12-09 DIAGNOSIS — N3941 Urge incontinence: Secondary | ICD-10-CM

## 2010-12-09 DIAGNOSIS — R300 Dysuria: Secondary | ICD-10-CM

## 2010-12-09 DIAGNOSIS — R3129 Other microscopic hematuria: Secondary | ICD-10-CM

## 2011-01-14 ENCOUNTER — Encounter (HOSPITAL_COMMUNITY): Payer: Self-pay

## 2011-01-14 ENCOUNTER — Emergency Department (HOSPITAL_COMMUNITY): Payer: 59

## 2011-01-14 ENCOUNTER — Emergency Department (HOSPITAL_COMMUNITY)
Admission: EM | Admit: 2011-01-14 | Discharge: 2011-01-14 | Disposition: A | Discharge status: Home / Self Care | Payer: 59 | Attending: Emergency Medicine | Admitting: Emergency Medicine

## 2011-01-14 DIAGNOSIS — S161XXA Strain of muscle, fascia and tendon at neck level, initial encounter: Secondary | ICD-10-CM

## 2011-01-14 DIAGNOSIS — M545 Low back pain, unspecified: Secondary | ICD-10-CM | POA: Insufficient documentation

## 2011-01-14 DIAGNOSIS — T2111XA Burn of first degree of chest wall, initial encounter: Secondary | ICD-10-CM | POA: Insufficient documentation

## 2011-01-14 DIAGNOSIS — M542 Cervicalgia: Secondary | ICD-10-CM | POA: Insufficient documentation

## 2011-01-14 DIAGNOSIS — W19XXXA Unspecified fall, initial encounter: Secondary | ICD-10-CM | POA: Insufficient documentation

## 2011-01-14 DIAGNOSIS — S8002XA Contusion of left knee, initial encounter: Secondary | ICD-10-CM

## 2011-01-14 DIAGNOSIS — M25569 Pain in unspecified knee: Secondary | ICD-10-CM | POA: Insufficient documentation

## 2011-01-14 DIAGNOSIS — S139XXA Sprain of joints and ligaments of unspecified parts of neck, initial encounter: Secondary | ICD-10-CM | POA: Insufficient documentation

## 2011-01-14 HISTORY — DX: Reserved for concepts with insufficient information to code with codable children: IMO0002

## 2011-01-14 MED ORDER — DIAZEPAM 5 MG PO TABS: ORAL_TABLET | ORAL | Status: DC

## 2011-01-14 MED ORDER — DIAZEPAM 5 MG PO TABS
5.0000 mg | ORAL_TABLET | Freq: Once | ORAL | Status: AC
  Administered 2011-01-14: 5 mg via ORAL
  Filled 2011-01-14: qty 1

## 2011-01-14 NOTE — ED Notes (Signed)
Slipped on curb, now having neck pain and wants left knee checked, h/o bulging disc in neck and had knee surgery in march

## 2011-01-14 NOTE — ED Notes (Signed)
Also wants low back checked, having mild pain.

## 2011-01-14 NOTE — ED Notes (Signed)
Pt taken to xray. Pt requesting to take ccollar off. I advised pt she needed to wait until her xrays came back.

## 2011-01-14 NOTE — ED Provider Notes (Signed)
History     CSN: [id number] Arrival date & time: 01/14/2011  6:57 PM  Chief Complaint  Patient presents with  . Neck Pain  . Knee Pain  . Back Pain    (Consider location/radiation/quality/duration/timing/severity/associated sxs/prior treatment) Patient is a 45 y.o. female presenting with fall. The history is provided by the patient.  Fall The accident occurred 3 to 5 hours ago. The fall occurred while walking. She fell from a height of 3 to 5 ft. She landed on concrete. There was no blood loss. The point of impact was the left knee. The pain is present in the left knee and neck (low back). The pain is at a severity of 6/10. The pain is mild. She was ambulatory at the scene. There was no entrapment after the fall. There was no drug use involved in the accident. There was no alcohol use involved in the accident. Pertinent negatives include no visual change, no fever, no numbness, no abdominal pain, no bowel incontinence, no nausea, no vomiting, no hematuria, no headaches, no hearing loss, no loss of consciousness and no tingling. The symptoms are aggravated by activity, extension, rotation and ambulation. Prehospitalization: c-collar applied at triage. She has tried nothing for the symptoms. The treatment provided no relief.    Past Medical History  Diagnosis Date  . Bulging disc     Past Surgical History  Procedure Date  . Knee surgery   . Tonsillectomy     History reviewed. No pertinent family history.  History  Substance Use Topics  . Smoking status: Former Games developer  . Smokeless tobacco: Not on file  . Alcohol Use: No    OB History    Grav Para Term Preterm Abortions TAB SAB Ect Mult Living                  Review of Systems  Constitutional: Negative for fever, chills, activity change, appetite change and fatigue.  HENT: Negative for sore throat, facial swelling, trouble swallowing, neck pain and neck stiffness.   Eyes: Negative for photophobia, pain and visual  disturbance.  Respiratory: Negative for cough, shortness of breath and wheezing.   Cardiovascular: Negative for chest pain and palpitations.  Gastrointestinal: Negative for nausea, vomiting, abdominal pain, blood in stool and bowel incontinence.  Genitourinary: Negative for dysuria, hematuria and flank pain.  Musculoskeletal: Positive for back pain and arthralgias. Negative for myalgias, joint swelling and gait problem.  Skin: Negative for rash and wound.  Neurological: Negative for dizziness, tingling, loss of consciousness, weakness, numbness and headaches.  Hematological: Does not bruise/bleed easily.  Psychiatric/Behavioral: Negative for confusion and decreased concentration.  All other systems reviewed and are negative.    Allergies  Erythromycin; VZD:GLOVFIEPPIR+JJOACZYSA+YTKZSWFUXN acid+aspartame; Metoclopramide hcl; and Sulfonamide derivatives  Home Medications  No current outpatient prescriptions on file.  BP 161/93  Pulse 86  Temp(Src) 98.5 F (36.9 C) (Oral)  Resp 20  Ht 5' (1.524 m)  Wt 130 lb (58.968 kg)  BMI 25.39 kg/m2  SpO2 100%  LMP 12/24/2010  Physical Exam  Nursing note and vitals reviewed. Constitutional: She is oriented to person, place, and time. She appears well-developed and well-nourished. No distress.  HENT:  Head: Normocephalic and atraumatic.  Mouth/Throat: Oropharynx is clear and moist.  Eyes: EOM are normal. Pupils are equal, round, and reactive to light.  Neck: Normal range of motion. Neck supple.  Cardiovascular: Normal rate, regular rhythm and normal heart sounds.   Pulmonary/Chest: Effort normal and breath sounds normal. No respiratory distress. She exhibits  no tenderness.  Abdominal: Soft. She exhibits no distension and no mass. There is no tenderness. There is no rebound and no guarding.  Musculoskeletal: She exhibits tenderness.       Left knee: She exhibits normal range of motion, no swelling, no effusion and normal patellar mobility.  tenderness found. No medial joint line, no lateral joint line, no MCL, no LCL and no patellar tendon tenderness noted.       Cervical back: She exhibits decreased range of motion, tenderness, bony tenderness and spasm. She exhibits no swelling, no edema, no pain and normal pulse.       Lumbar back: She exhibits tenderness. She exhibits normal range of motion, no bony tenderness, no edema and normal pulse.  Lymphadenopathy:    She has no cervical adenopathy.  Neurological: She is alert and oriented to person, place, and time. She has normal strength. She displays normal reflexes. No cranial nerve deficit or sensory deficit. She exhibits normal muscle tone. Coordination normal.  Reflex Scores:      Tricep reflexes are 2+ on the right side and 2+ on the left side.      Bicep reflexes are 2+ on the right side and 2+ on the left side.      Brachioradialis reflexes are 2+ on the right side and 2+ on the left side. Skin: Skin is warm and dry.  Psychiatric: She has a normal mood and affect.    ED Course  Procedures (including critical care time)    Dg Lumbar Spine Complete  01/14/2011  *RADIOLOGY REPORT*  Clinical Data: Fall, pain.  LUMBAR SPINE - COMPLETE 4+ VIEW  Comparison: CT 05/25/2010.  Findings: There is mild leftward scoliosis as seen on prior CT abdomen.  No fracture or subluxation.  Disc spaces are maintained. SI joints are symmetric and unremarkable.  IMPRESSION: Leftward scoliosis.  No acute findings.  Original Report Authenticated By: Cyndie Chime, M.D.   Ct Cervical Spine Wo Contrast  01/14/2011  *RADIOLOGY REPORT*  Clinical Data: Neck pain.  CT CERVICAL SPINE WITHOUT CONTRAST  Technique:  Multidetector CT imaging of the cervical spine was performed. Multiplanar CT image reconstructions were also generated.  Comparison: Plain films 06/09/2010.  Findings: Anatomic alignment.  No visible fracture or traumatic subluxation.  No prevertebral soft tissue swelling.  No intraspinal hematoma or  mass.  At C5-C6, there is a broad-based disc protrusion central and to the left.  It is not possible to say if this occurred as a result of the fall, but it is likely chronic.  There is advanced facet arthropathy at C7-T1, worse on the right.  No neck masses.  No abnormal calcifications.  Trachea midline. Lung apices clear.  IMPRESSION: No visible cervical spine fracture or subluxation.  Central and leftward protrusion at C5-6.  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  Original Report Authenticated By: Elsie Stain, M.D.   Dg Knee Complete 4 Views Left  01/14/2011  *RADIOLOGY REPORT*  Clinical Data: Larey Seat, pain  LEFT KNEE - COMPLETE 4+ VIEW  Comparison:  None.  Findings:  There is no evidence of fracture, dislocation, or joint effusion.  There is no evidence of arthropathy or other focal bone abnormality.  Soft tissues are unremarkable.  IMPRESSION: Negative.  Original Report Authenticated By: Elsie Stain, M.D.     MDM     9:00 PM patient feeling better,  Mild ttp of the paraspinal muscles of the lower back and neck.  No focal neuro deficits  or weakness.  Pt reports hx of previous "bulging disc" to her neck, with no neuro findings today, the disc protrusion is felt to likely be chronic.  Will give her soft c-collar.  She agrees to f/u with her orthopedic doctor for recheck if the pain is not improving     Hines Kloss L. Pedro [place], [place] 01/19/11 1439

## 2011-01-20 ENCOUNTER — Ambulatory Visit: Payer: No Typology Code available for payment source | Admitting: Urology

## 2011-01-25 NOTE — ED Provider Notes (Signed)
Medical screening examination/treatment/procedure(s) were performed by non-physician practitioner and as supervising physician I was immediately available for consultation/collaboration.   Nathen Balaban L Lesslie Mossa, MD 01/25/11 1739 

## 2011-02-01 ENCOUNTER — Other Ambulatory Visit: Payer: Self-pay | Admitting: Obstetrics and Gynecology

## 2011-05-06 ENCOUNTER — Emergency Department (HOSPITAL_COMMUNITY)
Admission: EM | Admit: 2011-05-06 | Discharge: 2011-05-06 | Disposition: A | Discharge status: Home / Self Care | Payer: 59 | Attending: Emergency Medicine | Admitting: Emergency Medicine

## 2011-05-06 ENCOUNTER — Encounter (HOSPITAL_COMMUNITY): Payer: Self-pay

## 2011-05-06 DIAGNOSIS — N390 Urinary tract infection, site not specified: Secondary | ICD-10-CM

## 2011-05-06 LAB — URINALYSIS, ROUTINE W REFLEX MICROSCOPIC
Glucose, UA: NEGATIVE mg/dL
Hgb urine dipstick: NEGATIVE
Leukocytes, UA: NEGATIVE
Nitrite: POSITIVE — AB
Protein, ur: 30 mg/dL — AB
Specific Gravity, Urine: 1.025 (ref 1.005–1.030)
Urobilinogen, UA: 0.2 mg/dL (ref 0.0–1.0)
pH: 7 (ref 5.0–8.0)

## 2011-05-06 LAB — URINE MICROSCOPIC-ADD ON

## 2011-05-06 MED ORDER — NITROFURANTOIN MACROCRYSTAL 100 MG PO CAPS
ORAL_CAPSULE | ORAL | Status: AC
  Administered 2011-05-06: 100 mg
  Filled 2011-05-06: qty 1

## 2011-05-06 MED ORDER — PHENAZOPYRIDINE HCL 200 MG PO TABS: 200.0000 mg | ORAL_TABLET | Freq: Three times a day (TID) | ORAL | Status: AC

## 2011-05-06 MED ORDER — NITROFURANTOIN MONOHYD MACRO 100 MG PO CAPS
100.0000 mg | ORAL_CAPSULE | Freq: Once | ORAL | Status: DC
  Filled 2011-05-06: qty 1

## 2011-05-06 MED ORDER — NITROFURANTOIN MONOHYD MACRO 100 MG PO CAPS: 100.0000 mg | ORAL_CAPSULE | Freq: Two times a day (BID) | ORAL | Status: AC

## 2011-05-06 MED ORDER — OXYCODONE-ACETAMINOPHEN 5-325 MG PO TABS
2.0000 | ORAL_TABLET | Freq: Once | ORAL | Status: AC
  Administered 2011-05-06: 2 via ORAL
  Filled 2011-05-06: qty 2

## 2011-05-06 MED ORDER — ONDANSETRON 4 MG PO TBDP
4.0000 mg | ORAL_TABLET | Freq: Once | ORAL | Status: AC
  Administered 2011-05-06: 4 mg via ORAL
  Filled 2011-05-06: qty 1

## 2011-05-06 NOTE — ED Notes (Addendum)
Pt presents with hematuria and pain with urination since Wednesday. Pt also c/o nausea and chills.

## 2011-05-06 NOTE — ED Notes (Signed)
MD at bedside. 

## 2011-05-06 NOTE — ED Notes (Signed)
Pt reports having pain on urination and blood in her urine since Wednesday. Pt states that she saw her urologist on Wednesday and had a urine analysis as well as a CT scan which showed that her stone in her right kidney had passed. Pt states that she was suppose to follow up with her urologist if symptoms did not get better but patient states her urologist has been out of the office. Pt reports pain before urination and afterwards. Pt also states that she will feel like she has "to pee but nothing will come out." pt reports pain 8/10 at this time.

## 2011-05-06 NOTE — ED Notes (Signed)
Pt alert & oriented x4, stable gait. Pt given discharge instructions, paperwork & prescription(s), pt verbalized understanding. Pt left department w/ no further questions.  

## 2011-05-06 NOTE — ED Provider Notes (Signed)
Scribed for Jasmine Bailiff, MD, the patient was seen in room APA17/APA17 . This chart was scribed by Jasmine Allen.   CSN: [id number]  Arrival date & time 05/06/11  Jasmine Allen   First MD Initiated Contact with Patient 05/06/11 1928      Chief Complaint  Patient presents with  . Hematuria  . Dysuria    (Consider location/radiation/quality/duration/timing/severity/associated sxs/prior treatment) HPI Pt seen at 7:44 PM Jasmine Allen is a 46 y.o. female who presents to the Emergency Department complaining of 3 days of dysuria and hematuria. Pt reports gross blood in urine. Pt has had associated nausea and chills. Denies vomiting.   Pt has taken Cipro until yesterday. Pt reports she also visited the urologist with and was told she did not have a UTI. There are no other associated symptoms and no other alleviating or aggravating factors.    Past Medical History  Diagnosis Date  . Bulging disc     Past Surgical History  Procedure Date  . Knee surgery   . Tonsillectomy     No family history on file.  History  Substance Use Topics  . Smoking status: Former Games developer  . Smokeless tobacco: Not on file  . Alcohol Use: No    Review of Systems  Constitutional: Positive for chills. Negative for fever.  HENT: Negative for congestion and sore throat.   Eyes: Negative for visual disturbance.  Respiratory: Negative for cough and shortness of breath.   Cardiovascular: Negative for chest pain and leg swelling.  Gastrointestinal: Positive for nausea. Negative for vomiting, abdominal pain and diarrhea.  Genitourinary: Positive for dysuria and hematuria.  Musculoskeletal: Negative for myalgias and back pain.  Skin: Negative for rash.  Neurological: Negative for dizziness, weakness, light-headedness, numbness and headaches.  Psychiatric/Behavioral: Negative for suicidal ideas and confusion.  All other systems reviewed and are negative.    Allergies  Erythromycin;  NWG:NFAOZHYQMVH+QIONGEXBM+WUXLKGMWNU acid+aspartame; Metoclopramide hcl; and Sulfonamide derivatives  Home Medications   Current Outpatient Rx  Name Route Sig Dispense Refill  . BUPRENORPHINE HCL-NALOXONE HCL 2-0.5 MG SL SUBL Sublingual Place 2 tablets under the tongue every morning.      Marland Kitchen BUPROPION HCL ER (XL) 150 MG PO TB24 Oral Take 150 mg by mouth daily.      Marland Kitchen DIAZEPAM 5 MG PO TABS  Take one tablet TID prn muscle spasms 15 tablet 0  . ESOMEPRAZOLE MAGNESIUM 40 MG PO CPDR Oral Take 40 mg by mouth daily before breakfast.      . ESTRADIOL 2 MG PO TABS Oral Take 2 mg by mouth 2 (two) times daily.      Marland Kitchen HYDROCHLOROTHIAZIDE 25 MG PO TABS Oral Take 25 mg by mouth daily.      . IBUPROFEN 800 MG PO TABS Oral Take 800 mg by mouth daily as needed. pain     . NITROFURANTOIN MONOHYD MACRO 100 MG PO CAPS Oral Take 1 capsule (100 mg total) by mouth 2 (two) times daily. 10 capsule 0  . PAROXETINE HCL 40 MG PO TABS Oral Take 40 mg by mouth daily.      Marland Kitchen PHENAZOPYRIDINE HCL 200 MG PO TABS Oral Take 1 tablet (200 mg total) by mouth 3 (three) times daily. 6 tablet 0  . PROGESTERONE MICRONIZED 200 MG PO CAPS Oral Take 200 mg by mouth at bedtime.      . TRAMADOL HCL 50 MG PO TABS Oral Take 50 mg by mouth every 6 (six) hours as needed. pain  BP 136/83  Pulse 86  Temp(Src) 98.4 F (36.9 C) (Oral)  Resp 20  Ht 5' (1.524 m)  Wt 130 lb (58.968 kg)  BMI 25.39 kg/m2  SpO2 100%  Physical Exam  Nursing note and vitals reviewed. Constitutional: She is oriented to person, place, and time. She appears well-developed and well-nourished. No distress.  HENT:  Head: Normocephalic and atraumatic.  Eyes: Conjunctivae and EOM are normal.  Neck: Normal range of motion. Neck supple.  Cardiovascular: Normal rate, regular rhythm and normal heart sounds.   Pulmonary/Chest: Effort normal and breath sounds normal. No respiratory distress.  Abdominal: Soft. There is tenderness (suprapublic ).  Musculoskeletal:  Normal range of motion. She exhibits no tenderness.  Neurological: She is alert and oriented to person, place, and time.  Skin: Skin is warm and dry.  Psychiatric: She has a normal mood and affect.    ED Course  Procedures (including critical care time) DIAGNOSTIC STUDIES: Oxygen Saturation is 100% on room air, normal by my interpretation.    COORDINATION OF CARE:  Labs Reviewed  URINALYSIS, ROUTINE W REFLEX MICROSCOPIC - Abnormal; Notable for the following:    Color, Urine GREEN (*) BIOCHEMICALS MAY BE AFFECTED BY COLOR   APPearance HAZY (*)    Bilirubin Urine MODERATE (*)    Ketones, ur TRACE (*)    Protein, ur 30 (*)    Nitrite POSITIVE (*)    All other components within normal limits  URINE MICROSCOPIC-ADD ON - Abnormal; Notable for the following:    Squamous Epithelial / LPF MANY (*)    Bacteria, UA MANY (*)    All other components within normal limits  URINE CULTURE   No results found.  ED MEDICATIONS  Medications  oxyCODONE-acetaminophen (PERCOCET) 5-325 MG per tablet 2 tablet   nitrofurantoin (macrocrystal-monohydrate) (MACROBID) capsule 100 mg     1. UTI (lower urinary tract infection)       MDM  Evidence of urinary tract infection on urinalysis. A urine culture was sent. She received her first dose of Macrobid in the emergency department. There no signs or symptoms to suggest pyelonephritis. I'll place her on Pyridium. While numerous department she requested a dose of pain medication. She received a dose of Percocet. Upon receiving the Percocet she asked nursing for an injection however this was not provided. She received a dose of Zofran was discharged home with a prescription for Pyridium and Macrobid. Should you to followup with her primary care physician  I personally performed the services described in this documentation, which was scribed in my presence. The recorded information has been reviewed and considered.        Jasmine Bailiff, MD 05/06/11  2003

## 2011-05-09 LAB — URINE CULTURE
Colony Count: >=100000
Culture  Setup Time: 201301272124

## 2011-05-10 NOTE — ED Notes (Signed)
Treated per protocol MD; Sensitive to same 

## 2011-05-11 ENCOUNTER — Emergency Department (HOSPITAL_COMMUNITY)
Admission: EM | Admit: 2011-05-11 | Discharge: 2011-05-11 | Disposition: A | Discharge status: Home / Self Care | Payer: 59 | Attending: Emergency Medicine | Admitting: Emergency Medicine

## 2011-05-11 ENCOUNTER — Encounter (HOSPITAL_COMMUNITY): Payer: Self-pay | Admitting: Emergency Medicine

## 2011-05-11 DIAGNOSIS — Z87891 Personal history of nicotine dependence: Secondary | ICD-10-CM | POA: Insufficient documentation

## 2011-05-11 DIAGNOSIS — R252 Cramp and spasm: Secondary | ICD-10-CM | POA: Insufficient documentation

## 2011-05-11 DIAGNOSIS — R110 Nausea: Secondary | ICD-10-CM | POA: Insufficient documentation

## 2011-05-11 DIAGNOSIS — R42 Dizziness and giddiness: Secondary | ICD-10-CM | POA: Insufficient documentation

## 2011-05-11 DIAGNOSIS — R5381 Other malaise: Secondary | ICD-10-CM | POA: Insufficient documentation

## 2011-05-11 DIAGNOSIS — R531 Weakness: Secondary | ICD-10-CM

## 2011-05-11 LAB — CBC
HCT: 45.4 % (ref 36.0–46.0)
Hemoglobin: 15.5 g/dL — ABNORMAL HIGH (ref 12.0–15.0)
MCH: 29.1 pg (ref 26.0–34.0)
MCHC: 34.1 g/dL (ref 30.0–36.0)
MCV: 85.3 fL (ref 78.0–100.0)
Platelets: 263 10*3/uL (ref 150–400)
RBC: 5.32 MIL/uL — ABNORMAL HIGH (ref 3.87–5.11)
RDW: 12.7 % (ref 11.5–15.5)
WBC: 7.1 10*3/uL (ref 4.0–10.5)

## 2011-05-11 LAB — URINALYSIS, ROUTINE W REFLEX MICROSCOPIC
Bilirubin Urine: NEGATIVE
Glucose, UA: NEGATIVE mg/dL
Hgb urine dipstick: NEGATIVE
Ketones, ur: NEGATIVE mg/dL
Leukocytes, UA: NEGATIVE
Nitrite: NEGATIVE
Protein, ur: NEGATIVE mg/dL
Specific Gravity, Urine: 1.01 (ref 1.005–1.030)
Urobilinogen, UA: 0.2 mg/dL (ref 0.0–1.0)
pH: 7 (ref 5.0–8.0)

## 2011-05-11 LAB — BASIC METABOLIC PANEL
BUN: 19 mg/dL (ref 6–23)
CO2: 31 mEq/L (ref 19–32)
Calcium: 10.9 mg/dL — ABNORMAL HIGH (ref 8.4–10.5)
Chloride: 97 mEq/L (ref 96–112)
Creatinine, Ser: 0.83 mg/dL (ref 0.50–1.10)
GFR calc Af Amer: >90 mL/min (ref >90)
GFR calc non Af Amer: 85 mL/min — ABNORMAL LOW (ref >90)
Glucose, Bld: 94 mg/dL (ref 70–99)
Potassium: 3.6 mEq/L (ref 3.5–5.1)
Sodium: 138 mEq/L (ref 135–145)

## 2011-05-11 MED ORDER — DIPHENHYDRAMINE HCL 50 MG/ML IJ SOLN
25.0000 mg | Freq: Once | INTRAMUSCULAR | Status: AC
  Administered 2011-05-11: 25 mg via INTRAVENOUS
  Filled 2011-05-11: qty 1

## 2011-05-11 MED ORDER — SODIUM CHLORIDE 0.9 % IV BOLUS (SEPSIS)
1000.0000 mL | Freq: Once | INTRAVENOUS | Status: AC
  Administered 2011-05-11: 1000 mL via INTRAVENOUS

## 2011-05-11 MED ORDER — KETOROLAC TROMETHAMINE 30 MG/ML IJ SOLN
30.0000 mg | Freq: Once | INTRAMUSCULAR | Status: AC
  Administered 2011-05-11: 30 mg via INTRAVENOUS
  Filled 2011-05-11: qty 1

## 2011-05-11 MED ORDER — ONDANSETRON HCL 4 MG/2ML IJ SOLN
4.0000 mg | Freq: Once | INTRAMUSCULAR | Status: AC
  Administered 2011-05-11: 4 mg via INTRAVENOUS
  Filled 2011-05-11: qty 2

## 2011-05-11 MED ORDER — POTASSIUM CHLORIDE CRYS ER 20 MEQ PO TBCR
60.0000 meq | EXTENDED_RELEASE_TABLET | Freq: Once | ORAL | Status: AC
  Administered 2011-05-11: 60 meq via ORAL
  Filled 2011-05-11: qty 3

## 2011-05-11 MED ORDER — HYDROMORPHONE HCL PF 1 MG/ML IJ SOLN
1.0000 mg | Freq: Once | INTRAMUSCULAR | Status: AC
  Administered 2011-05-11: 1 mg via INTRAVENOUS
  Filled 2011-05-11: qty 1

## 2011-05-11 NOTE — ED Notes (Signed)
Pt c/o severe headache

## 2011-05-11 NOTE — ED Provider Notes (Addendum)
History     CSN: [id number]  Arrival date & time 05/11/11  1225   First MD Initiated Contact with Patient 05/11/11 1302      Chief Complaint  Patient presents with  . Dehydration    (Consider location/radiation/quality/duration/timing/severity/associated sxs/prior treatment) HPI Jasmine Allen is a 46 y.o. female who presents to the Emergency Department complaining of weakness, dizziness with position changes and low potassium. Patient is under treatment for a UTI with macrobid since Saturday. She continued to have weakness and nausea. Was seen by her PCP yesterday and advised she had low potassium. Felt no better today and called their office. They advised her to come to the ER for fluids.  PCP [facility] Past Medical History  Diagnosis Date  . Bulging disc     Past Surgical History  Procedure Date  . Knee surgery   . Tonsillectomy     No family history on file.  History  Substance Use Topics  . Smoking status: Former Games developer  . Smokeless tobacco: Not on file  . Alcohol Use: No    OB History    Grav Para Term Preterm Abortions TAB SAB Ect Mult Living                  Review of Systems A 10 review of systems reviewed and are negative for acute change except as noted in the HPI. Allergies  WUJ:WJXBJYNWGNF+AOZHYQMVH+QIONGEXBMW acid+aspartame; Metoclopramide hcl; Erythromycin; and Sulfonamide derivatives  Home Medications   Current Outpatient Rx  Name Route Sig Dispense Refill  . BUPROPION HCL ER (XL) 150 MG PO TB24 Oral Take 150 mg by mouth daily.      Marland Kitchen CETIRIZINE HCL 10 MG PO TABS Oral Take 10 mg by mouth daily.    Marland Kitchen DOCUSATE SODIUM 100 MG PO CAPS Oral Take 100 mg by mouth daily.    . DULOXETINE HCL 60 MG PO CPEP Oral Take 60 mg by mouth daily.    Marland Kitchen ESOMEPRAZOLE MAGNESIUM 40 MG PO CPDR Oral Take 40 mg by mouth daily before breakfast.      . IRON 325 (65 FE) MG PO TABS Oral Take 1 tablet by mouth daily.    Marland Kitchen  HYDROCHLOROTHIAZIDE 25 MG PO TABS Oral Take 25 mg by mouth daily.      . IBUPROFEN 800 MG PO TABS Oral Take 800 mg by mouth daily as needed. pain     . ADULT MULTIVITAMIN W/MINERALS CH Oral Take 1 tablet by mouth daily.    Marland Kitchen NITROFURANTOIN MONOHYD MACRO 100 MG PO CAPS Oral Take 1 capsule (100 mg total) by mouth 2 (two) times daily. 10 capsule 0  . NORETHINDRONE-ETH ESTRADIOL 0.5-2.5 MG-MCG PO TABS Oral Take 1 tablet by mouth daily.    Marland Kitchen PAROXETINE HCL 40 MG PO TABS Oral Take 40 mg by mouth daily.        BP 112/94  Pulse 90  Temp(Src) 98.1 F (36.7 C) (Oral)  Resp 17  Ht 5' (1.524 m)  Wt 125 lb (56.7 kg)  BMI 24.41 kg/m2  SpO2 97%  Physical Exam  Nursing note and vitals reviewed. Constitutional: She is oriented to person, place, and time. She appears well-developed and well-nourished. No distress.  HENT:  Head: Normocephalic.  Right Ear: External ear normal.  Left Ear: External ear normal.  Mouth/Throat: Oropharynx is clear and moist.  Eyes: EOM are normal. Pupils are equal, round, and reactive to light.  Neck: Normal range of motion.  Cardiovascular: Normal  rate and normal heart sounds.   Pulmonary/Chest: Effort normal and breath sounds normal.  Abdominal: Soft. Bowel sounds are normal.  Genitourinary: Vagina normal and uterus normal.  Musculoskeletal: Normal range of motion.  Neurological: She is alert and oriented to person, place, and time. She has normal reflexes.  Skin: Skin is warm and dry.  Psychiatric: She has a normal mood and affect.    ED Course  Procedures (including critical care time) Results for orders placed during the hospital encounter of 05/11/11  BASIC METABOLIC PANEL      Component Value Range   Sodium 138  135 - 145 (mEq/L)   Potassium 3.6  3.5 - 5.1 (mEq/L)   Chloride 97  96 - 112 (mEq/L)   CO2 31  19 - 32 (mEq/L)   Glucose, Bld 94  70 - 99 (mg/dL)   BUN 19  6 - 23 (mg/dL)   Creatinine, Ser 0.27  0.50 - 1.10 (mg/dL)   Calcium 25.3 (*) 8.4 -  10.5 (mg/dL)   GFR calc non Af Amer 85 (*) >90 (mL/min)   GFR calc Af Amer >90  >90 (mL/min)  CBC      Component Value Range   WBC 7.1  4.0 - 10.5 (K/uL)   RBC 5.32 (*) 3.87 - 5.11 (MIL/uL)   Hemoglobin 15.5 (*) 12.0 - 15.0 (g/dL)   HCT 66.4  40.3 - 47.4 (%)   MCV 85.3  78.0 - 100.0 (fL)   MCH 29.1  26.0 - 34.0 (pg)   MCHC 34.1  30.0 - 36.0 (g/dL)   RDW 25.9  56.3 - 87.5 (%)   Platelets 263  150 - 400 (K/uL)  URINALYSIS, ROUTINE W REFLEX MICROSCOPIC      Component Value Range   Color, Urine YELLOW  YELLOW    APPearance CLEAR  CLEAR    Specific Gravity, Urine 1.010  1.005 - 1.030    pH 7.0  5.0 - 8.0    Glucose, UA NEGATIVE  NEGATIVE (mg/dL)   Hgb urine dipstick NEGATIVE  NEGATIVE    Bilirubin Urine NEGATIVE  NEGATIVE    Ketones, ur NEGATIVE  NEGATIVE (mg/dL)   Protein, ur NEGATIVE  NEGATIVE (mg/dL)   Urobilinogen, UA 0.2  0.0 - 1.0 (mg/dL)   Nitrite NEGATIVE  NEGATIVE    Leukocytes, UA NEGATIVE  NEGATIVE       MDM  Patient with generalized weakness, nausea, leg cramping advised to come to the ER for fluids. PE was unremarkable. Labs with normal potassium, orthostatics were negative. UA shows resolution of the UTI. She 5 days remaining on her antibiotic therapy. She has had IVF, PO fluids, a snack, with improvement. Pt feels improved after observation and/or treatment in ED.Pt stable in ED with no significant deterioration in condition.The patient appears reasonably screened and/or stabilized for discharge and I doubt any other medical condition or other [facility] requiring further screening, evaluation, or treatment in the ED at this time prior to discharge.  MDM Reviewed: nursing note and vitals Interpretation: labs           Nicoletta Dress. Colon Branch, MD 05/11/11 1518  Nicoletta Dress. Colon Branch, MD 05/11/11 [phone]

## 2011-05-11 NOTE — ED Notes (Signed)
Seen by PCP yesterday and was told her K was low and was told today to go to ER to get fluids. PCP at [facility].  Denies vomiting but is nauseated.

## 2011-05-11 NOTE — ED Notes (Signed)
Pt given sprite to drink, tolerated well.  nad noted.

## 2011-06-19 ENCOUNTER — Other Ambulatory Visit: Payer: Self-pay | Admitting: Family Medicine

## 2011-06-19 DIAGNOSIS — R251 Tremor, unspecified: Secondary | ICD-10-CM

## 2011-06-21 ENCOUNTER — Ambulatory Visit
Admission: RE | Admit: 2011-06-21 | Discharge: 2011-06-21 | Disposition: A | Discharge status: Home / Self Care | Payer: 59 | Source: Ambulatory Visit | Attending: Family Medicine | Admitting: Family Medicine

## 2011-06-21 DIAGNOSIS — R251 Tremor, unspecified: Secondary | ICD-10-CM

## 2011-07-21 ENCOUNTER — Emergency Department (HOSPITAL_COMMUNITY): Payer: No Typology Code available for payment source

## 2011-07-21 ENCOUNTER — Emergency Department (HOSPITAL_COMMUNITY)
Admission: EM | Admit: 2011-07-21 | Discharge: 2011-07-22 | Disposition: A | Discharge status: Home / Self Care | Payer: No Typology Code available for payment source | Attending: Emergency Medicine | Admitting: Emergency Medicine

## 2011-07-21 ENCOUNTER — Encounter (HOSPITAL_COMMUNITY): Payer: Self-pay | Admitting: Emergency Medicine

## 2011-07-21 DIAGNOSIS — M545 Low back pain, unspecified: Secondary | ICD-10-CM | POA: Insufficient documentation

## 2011-07-21 DIAGNOSIS — S161XXA Strain of muscle, fascia and tendon at neck level, initial encounter: Secondary | ICD-10-CM

## 2011-07-21 DIAGNOSIS — S301XXA Contusion of abdominal wall, initial encounter: Secondary | ICD-10-CM | POA: Insufficient documentation

## 2011-07-21 DIAGNOSIS — S335XXA Sprain of ligaments of lumbar spine, initial encounter: Secondary | ICD-10-CM | POA: Insufficient documentation

## 2011-07-21 DIAGNOSIS — S139XXA Sprain of joints and ligaments of unspecified parts of neck, initial encounter: Secondary | ICD-10-CM | POA: Insufficient documentation

## 2011-07-21 DIAGNOSIS — S39012A Strain of muscle, fascia and tendon of lower back, initial encounter: Secondary | ICD-10-CM

## 2011-07-21 DIAGNOSIS — M542 Cervicalgia: Secondary | ICD-10-CM | POA: Insufficient documentation

## 2011-07-21 DIAGNOSIS — Y92410 Unspecified street and highway as the place of occurrence of the external cause: Secondary | ICD-10-CM | POA: Insufficient documentation

## 2011-07-21 DIAGNOSIS — R079 Chest pain, unspecified: Secondary | ICD-10-CM | POA: Insufficient documentation

## 2011-07-21 LAB — RAPID URINE DRUG SCREEN, HOSP PERFORMED
Amphetamines: POSITIVE — AB
Barbiturates: POSITIVE — AB
Benzodiazepines: NOT DETECTED
Cocaine: NOT DETECTED
Opiates: NOT DETECTED
Tetrahydrocannabinol: NOT DETECTED

## 2011-07-21 LAB — URINALYSIS, ROUTINE W REFLEX MICROSCOPIC
Bilirubin Urine: NEGATIVE
Glucose, UA: NEGATIVE mg/dL
Hgb urine dipstick: NEGATIVE
Ketones, ur: 15 mg/dL — AB
Leukocytes, UA: NEGATIVE
Nitrite: NEGATIVE
Protein, ur: NEGATIVE mg/dL
Specific Gravity, Urine: 1.035 — ABNORMAL HIGH (ref 1.005–1.030)
Urobilinogen, UA: 0.2 mg/dL (ref 0.0–1.0)
pH: 6 (ref 5.0–8.0)

## 2011-07-21 LAB — POCT I-STAT, CHEM 8
BUN: 14 mg/dL (ref 6–23)
Calcium, Ion: 1.21 mmol/L (ref 1.12–1.32)
Chloride: 102 mEq/L (ref 96–112)
Creatinine, Ser: 0.8 mg/dL (ref 0.50–1.10)
Glucose, Bld: 125 mg/dL — ABNORMAL HIGH (ref 70–99)
HCT: 40 % (ref 36.0–46.0)
Hemoglobin: 13.6 g/dL (ref 12.0–15.0)
Potassium: 3.3 mEq/L — ABNORMAL LOW (ref 3.5–5.1)
Sodium: 143 mEq/L (ref 135–145)
TCO2: 28 mmol/L (ref 0–100)

## 2011-07-21 LAB — CBC
HCT: 38.1 % (ref 36.0–46.0)
Hemoglobin: 13.2 g/dL (ref 12.0–15.0)
MCH: 30 pg (ref 26.0–34.0)
MCHC: 34.6 g/dL (ref 30.0–36.0)
MCV: 86.6 fL (ref 78.0–100.0)
Platelets: 248 10*3/uL (ref 150–400)
RBC: 4.4 MIL/uL (ref 3.87–5.11)
RDW: 13.3 % (ref 11.5–15.5)
WBC: 10.1 10*3/uL (ref 4.0–10.5)

## 2011-07-21 LAB — DIFFERENTIAL
Basophils Absolute: 0 10*3/uL (ref 0.0–0.1)
Basophils Relative: 0 % (ref 0–1)
Eosinophils Absolute: 0.2 10*3/uL (ref 0.0–0.7)
Eosinophils Relative: 2 % (ref 0–5)
Lymphocytes Relative: 19 % (ref 12–46)
Lymphs Abs: 1.9 10*3/uL (ref 0.7–4.0)
Monocytes Absolute: 0.7 10*3/uL (ref 0.1–1.0)
Monocytes Relative: 7 % (ref 3–12)
Neutro Abs: 7.3 10*3/uL (ref 1.7–7.7)
Neutrophils Relative %: 72 % (ref 43–77)

## 2011-07-21 LAB — PREGNANCY, URINE: Preg Test, Ur: NEGATIVE

## 2011-07-21 LAB — POCT I-STAT TROPONIN I: Troponin i, poc: 0.02 ng/mL (ref 0.00–0.08)

## 2011-07-21 MED ORDER — TETANUS-DIPHTH-ACELL PERTUSSIS 5-2.5-18.5 LF-MCG/0.5 IM SUSP
0.5000 mL | Freq: Once | INTRAMUSCULAR | Status: AC
  Administered 2011-07-22: 0.5 mL via INTRAMUSCULAR
  Filled 2011-07-21: qty 0.5

## 2011-07-21 MED ORDER — ONDANSETRON HCL 4 MG/2ML IJ SOLN
4.0000 mg | Freq: Once | INTRAMUSCULAR | Status: AC
  Administered 2011-07-21: 4 mg via INTRAVENOUS
  Filled 2011-07-21: qty 2

## 2011-07-21 MED ORDER — HYDROMORPHONE HCL PF 1 MG/ML IJ SOLN
1.0000 mg | Freq: Once | INTRAMUSCULAR | Status: AC
  Administered 2011-07-22: 1 mg via INTRAVENOUS
  Filled 2011-07-21: qty 1

## 2011-07-21 NOTE — ED Notes (Signed)
Received bedside report from Selena Batten, Charity fundraiser.  Patient currently being taken to CT; will continue to monitor.

## 2011-07-21 NOTE — ED Notes (Signed)
Patient back from CT; updated patient on plan of care; informed patient that PA has ordered medications and RN is currently getting them out of the Pyx[facility]; patient has no other questions or concerns at this time; will continue to monitor.
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
## 2011-07-22 ENCOUNTER — Encounter (HOSPITAL_COMMUNITY): Payer: Self-pay | Admitting: Radiology

## 2011-07-22 MED ORDER — OXYCODONE-ACETAMINOPHEN 5-325 MG PO TABS
1.0000 | ORAL_TABLET | Freq: Once | ORAL | Status: AC
  Administered 2011-07-22: 1 via ORAL
  Filled 2011-07-22: qty 1

## 2011-07-22 MED ORDER — OXYCODONE-ACETAMINOPHEN 5-325 MG PO TABS: 1.0000 | ORAL_TABLET | ORAL | Status: AC | PRN

## 2011-07-22 MED ORDER — IOHEXOL 300 MG/ML  SOLN
80.0000 mL | Freq: Once | INTRAMUSCULAR | Status: AC | PRN
  Administered 2011-07-22: 80 mL via INTRAVENOUS

## 2011-07-22 NOTE — ED Notes (Signed)
Patient currently resting quietly in bed; no respiratory or acute distress noted.  Patient updated on plan of care; informed patient that we are currently waiting on further orders from PA.  Family present at bedside.  Patient has no other questions or concerns at this time; will continue to monitor.

## 2011-07-22 NOTE — ED Notes (Signed)
Patient currently resting quietly in bed; no respiratory or acute distress noted.  Patient updated on plan of care; informed patient that we are currently waiting on disposition from PA.  Patient has no other questions or concerns; will continue to monitor.

## 2011-07-22 NOTE — ED Notes (Signed)
Patient requesting family member (father) to come back to see her.  Requested family member is currently with pediatric patient up on floor; [phone] called and notified that patient is requesting family member.

## 2011-07-22 NOTE — ED Notes (Signed)
PA at bedside.

## 2011-07-22 NOTE — ED Notes (Signed)
Patient given discharge paperwork; went over discharge instructions with patient.  Patient instructed to take Norco as directed and to not drive while taking pain medication.  Instructed patient to follow up with primary care physician within one week if symptoms do not improve and to return to the ED for new, worsening, or concerning symptoms.

## 2011-07-22 NOTE — Discharge Instructions (Signed)
Back Pain, Adult Low back pain is very common. About 1 in 5 people have back pain.The cause of low back pain is rarely dangerous. The pain often gets better over time.About half of people with a sudden onset of back pain feel better in just 2 weeks. About 8 in 10 people feel better by 6 weeks.  CAUSES Some common causes of back pain include:  Strain of the muscles or ligaments supporting the spine.   Wear and tear (degeneration) of the spinal discs.   Arthritis.   Direct injury to the back.  DIAGNOSIS Most of the time, the direct cause of low back pain is not known.However, back pain can be treated effectively even when the exact cause of the pain is unknown.Answering your caregiver's questions about your overall health and symptoms is one of the most accurate ways to make sure the cause of your pain is not dangerous. If your caregiver needs more information, he or she may order lab work or imaging tests (X-rays or MRIs).However, even if imaging tests show changes in your back, this usually does not require surgery. HOME CARE INSTRUCTIONS For many people, back pain returns.Since low back pain is rarely dangerous, it is often a condition that people can learn to manageon their own.   Remain active. It is stressful on the back to sit or stand in one place. Do not sit, drive, or stand in one place for more than 30 minutes at a time. Take short walks on level surfaces as soon as pain allows.Try to increase the length of time you walk each day.   Do not stay in bed.Resting more than 1 or 2 days can delay your recovery.   Do not avoid exercise or work.Your body is made to move.It is not dangerous to be active, even though your back may hurt.Your back will likely heal faster if you return to being active before your pain is gone.   Pay attention to your body when you bend and lift. Many people have less discomfortwhen lifting if they bend their knees, keep the load close to their  bodies,and avoid twisting. Often, the most comfortable positions are those that put less stress on your recovering back.   Find a comfortable position to sleep. Use a firm mattress and lie on your side with your knees slightly bent. If you lie on your back, put a pillow under your knees.   Only take over-the-counter or prescription medicines as directed by your caregiver. Over-the-counter medicines to reduce pain and inflammation are often the most helpful.Your caregiver may prescribe muscle relaxant drugs.These medicines help dull your pain so you can more quickly return to your normal activities and healthy exercise.   Put ice on the injured area.   Put ice in a plastic bag.   Place a towel between your skin and the bag.   Leave the ice on for 15 to 20 minutes, 3 to 4 times a day for the first 2 to 3 days. After that, ice and heat may be alternated to reduce pain and spasms.   Ask your caregiver about trying back exercises and gentle massage. This may be of some benefit.   Avoid feeling anxious or stressed.Stress increases muscle tension and can worsen back pain.It is important to recognize when you are anxious or stressed and learn ways to manage it.Exercise is a great option.  SEEK MEDICAL CARE IF:  You have pain that is not relieved with rest or medicine.   You have   pain that does not improve in 1 week.   You have new symptoms.   You are generally not feeling well.  SEEK IMMEDIATE MEDICAL CARE IF:   You have pain that radiates from your back into your legs.   You develop new bowel or bladder control problems.   You have unusual weakness or numbness in your arms or legs.   You develop nausea or vomiting.   You develop abdominal pain.   You feel faint.  Document Released: 03/27/2005 Document Revised: 03/16/2011 Document Reviewed: 08/15/2010 [facility] Patient Information 2012 [place], [place].Cervical Sprain A cervical sprain is an injury in the neck in which the  ligaments are stretched or torn. The ligaments are the tissues that hold the bones of the neck (vertebrae) in place.Cervical sprains can range from very mild to very severe. Most cervical sprains get better in 1 to 3 weeks, but it depends on the cause and extent of the injury. Severe cervical sprains can cause the neck vertebrae to be unstable. This can lead to damage of the spinal cord and can result in serious nervous system problems. Your caregiver will determine whether your cervical sprain is mild or severe. CAUSES  Severe cervical sprains may be caused by:  Contact sport injuries (football, rugby, wrestling, hockey, auto racing, gymnastics, diving, martial arts, boxing).   Motor vehicle collisions.   Whiplash injuries. This means the neck is forcefully whipped backward and forward.   Falls.  Mild cervical sprains may be caused by:   Awkward positions, such as cradling a telephone between your ear and shoulder.   Sitting in a chair that does not offer proper support.   Working at a poorly Marketing executive station.   Activities that require looking up or down for long periods of time.  SYMPTOMS   Pain, soreness, stiffness, or a burning sensation in the front, back, or sides of the neck. This discomfort may develop immediately after injury or it may develop slowly and not begin for 24 hours or more after an injury.   Pain or tenderness directly in the middle of the back of the neck.   Shoulder or upper back pain.   Limited ability to move the neck.   Headache.   Dizziness.   Weakness, numbness, or tingling in the hands or arms.   Muscle spasms.   Difficulty swallowing or chewing.   Tenderness and swelling of the neck.  DIAGNOSIS  Most of the time, your caregiver can diagnose this problem by taking your history and doing a physical exam. Your caregiver will ask about any known problems, such as arthritis in the neck or a previous neck injury. X-rays may be taken to find  out if there are any other problems, such as problems with the bones of the neck. However, an X-ray often does not reveal the full extent of a cervical sprain. Other tests such as a computed tomography (CT) scan or magnetic resonance imaging (MRI) may be needed. TREATMENT  Treatment depends on the severity of the cervical sprain. Mild sprains can be treated with rest, keeping the neck in place (immobilization), and pain medicines. Severe cervical sprains need immediate immobilization and an appointment with an orthopedist or neurosurgeon. Several treatment options are available to help with pain, muscle spasms, and other symptoms. Your caregiver may prescribe:  Medicines, such as pain relievers, numbing medicines, or muscle relaxants.   Physical therapy. This can include stretching exercises, strengthening exercises, and posture training. Exercises and improved posture can help stabilize the  neck, strengthen muscles, and help stop symptoms from returning.   A neck collar to be worn for short periods of time. Often, these collars are worn for comfort. However, certain collars may be worn to protect the neck and prevent further worsening of a serious cervical sprain.  HOME CARE INSTRUCTIONS   Put ice on the injured area.   Put ice in a plastic bag.   Place a towel between your skin and the bag.   Leave the ice on for 15 to 20 minutes, 3 to 4 times a day.   Only take over-the-counter or prescription medicines for pain, discomfort, or fever as directed by your caregiver.   Keep all follow-up appointments as directed by your caregiver.   Keep all physical therapy appointments as directed by your caregiver.   If a neck collar is prescribed, wear it as directed by your caregiver.   Do not drive while wearing a neck collar.   Make any needed adjustments to your work station to promote good posture.   Avoid positions and activities that make your symptoms worse.   Warm up and stretch before  being active to help prevent problems.  SEEK MEDICAL CARE IF:   Your pain is not controlled with medicine.   You are unable to decrease your pain medicine over time as planned.   Your activity level is not improving as expected.  SEEK IMMEDIATE MEDICAL CARE IF:   You develop any bleeding, stomach upset, or signs of an allergic reaction to your medicine.   Your symptoms get worse.   You develop new, unexplained symptoms.   You have numbness, tingling, weakness, or paralysis in any part of your body.  MAKE SURE YOU:   Understand these instructions.   Will watch your condition.   Will get help right away if you are not doing well or get worse.  Document Released: 01/22/2007 Document Revised: 03/16/2011 Document Reviewed: 12/28/2010 [facility] Patient Information 2012 [place], [place].Blunt Abdominal Trauma A blunt injury to the abdomen can cause pain. The pain is most likely from bruising and stretching of your muscles. This pain is often made worse with movement. Most often these injuries are not serious and get better within 1 week with rest and mild pain medicine. However, internal organs (liver, spleen, kidneys) can be injured with blunt trauma. If you do not get better or if you get worse, further examination may be needed. Continue with your regular daily activities, but avoid any strenuous activities until your pain is improved. If your stomach is upset, stick to a clear liquid diet and slowly advance to solid food.  SEEK IMMEDIATE MEDICAL CARE IF:   You develop increasing pain, nausea, or repeated vomiting.   You develop chest pain or breathing difficulty.   You develop blood in the urine, vomit, or stool.   You develop weakness, fainting, fever, or other serious complaints.  Document Released: 05/04/2004 Document Revised: 03/16/2011 Document Reviewed: 08/20/2008 [facility] Patient Information 2012 [place], [place].Contusion A contusion is a deep bruise. Contusions are the  result of an injury that caused bleeding under the skin. The contusion may turn blue, purple, or yellow. Minor injuries will give you a painless contusion, but more severe contusions may stay painful and swollen for a few weeks.  CAUSES  A contusion is usually caused by a blow, trauma, or direct force to an area of the body. SYMPTOMS   Swelling and redness of the injured area.   Bruising of the injured area.  Tenderness and soreness of the injured area.   Pain.  DIAGNOSIS  The diagnosis can be made by taking a history and physical exam. An X-ray, CT scan, or MRI may be needed to determine if there were any associated injuries, such as fractures. TREATMENT  Specific treatment will depend on what area of the body was injured. In general, the best treatment for a contusion is resting, icing, elevating, and applying cold compresses to the injured area. Over-the-counter medicines may also be recommended for pain control. Ask your caregiver what the best treatment is for your contusion. HOME CARE INSTRUCTIONS   Put ice on the injured area.   Put ice in a plastic bag.   Place a towel between your skin and the bag.   Leave the ice on for 15 to 20 minutes, 3 to 4 times a day.   Only take over-the-counter or prescription medicines for pain, discomfort, or fever as directed by your caregiver. Your caregiver may recommend avoiding anti-inflammatory medicines (aspirin, ibuprofen, and naproxen) for 48 hours because these medicines may increase bruising.   Rest the injured area.   If possible, elevate the injured area to reduce swelling.  SEEK IMMEDIATE MEDICAL CARE IF:   You have increased bruising or swelling.   You have pain that is getting worse.   Your swelling or pain is not relieved with medicines.  MAKE SURE YOU:   Understand these instructions.   Will watch your condition.   Will get help right away if you are not doing well or get worse.  Document Released: 01/04/2005  Document Revised: 03/16/2011 Document Reviewed: 01/30/2011 [facility] Patient Information 2012 [place], [place].

## 2011-07-23 NOTE — ED Provider Notes (Signed)
Medical screening examination/treatment/procedure(s) were performed by non-physician practitioner and as supervising physician I was immediately available for consultation/collaboration.  Flint Melter, MD 07/23/11 [phone]

## 2011-08-10 ENCOUNTER — Emergency Department (HOSPITAL_COMMUNITY)
Admission: EM | Admit: 2011-08-10 | Discharge: 2011-08-10 | Disposition: A | Discharge status: Home / Self Care | Payer: 59 | Attending: Emergency Medicine | Admitting: Emergency Medicine

## 2011-08-10 ENCOUNTER — Emergency Department (HOSPITAL_COMMUNITY): Payer: 59

## 2011-08-10 ENCOUNTER — Encounter (HOSPITAL_COMMUNITY): Payer: Self-pay | Admitting: Emergency Medicine

## 2011-08-10 DIAGNOSIS — R141 Gas pain: Secondary | ICD-10-CM | POA: Insufficient documentation

## 2011-08-10 DIAGNOSIS — R10813 Right lower quadrant abdominal tenderness: Secondary | ICD-10-CM | POA: Insufficient documentation

## 2011-08-10 DIAGNOSIS — R142 Eructation: Secondary | ICD-10-CM | POA: Insufficient documentation

## 2011-08-10 DIAGNOSIS — R1031 Right lower quadrant pain: Secondary | ICD-10-CM | POA: Insufficient documentation

## 2011-08-10 DIAGNOSIS — R143 Flatulence: Secondary | ICD-10-CM | POA: Insufficient documentation

## 2011-08-10 LAB — COMPREHENSIVE METABOLIC PANEL
ALT: 11 U/L (ref 0–35)
AST: 16 U/L (ref 0–37)
Albumin: 4 g/dL (ref 3.5–5.2)
Alkaline Phosphatase: 100 U/L (ref 39–117)
BUN: 20 mg/dL (ref 6–23)
CO2: 31 mEq/L (ref 19–32)
Calcium: 9.8 mg/dL (ref 8.4–10.5)
Chloride: 100 mEq/L (ref 96–112)
Creatinine, Ser: 0.71 mg/dL (ref 0.50–1.10)
GFR calc Af Amer: >90 mL/min (ref >90)
GFR calc non Af Amer: >90 mL/min (ref >90)
Glucose, Bld: 94 mg/dL (ref 70–99)
Potassium: 3.8 mEq/L (ref 3.5–5.1)
Sodium: 139 mEq/L (ref 135–145)
Total Bilirubin: 0.2 mg/dL — ABNORMAL LOW (ref 0.3–1.2)
Total Protein: 7.3 g/dL (ref 6.0–8.3)

## 2011-08-10 LAB — CBC
HCT: 39.7 % (ref 36.0–46.0)
Hemoglobin: 13.7 g/dL (ref 12.0–15.0)
MCH: 30 pg (ref 26.0–34.0)
MCHC: 34.5 g/dL (ref 30.0–36.0)
MCV: 87.1 fL (ref 78.0–100.0)
Platelets: 307 10*3/uL (ref 150–400)
RBC: 4.56 MIL/uL (ref 3.87–5.11)
RDW: 13 % (ref 11.5–15.5)
WBC: 8.2 10*3/uL (ref 4.0–10.5)

## 2011-08-10 LAB — PREGNANCY, URINE: Preg Test, Ur: NEGATIVE

## 2011-08-10 LAB — LIPASE, BLOOD: Lipase: 40 U/L (ref 11–59)

## 2011-08-10 LAB — URINALYSIS, ROUTINE W REFLEX MICROSCOPIC
Bilirubin Urine: NEGATIVE
Glucose, UA: NEGATIVE mg/dL
Hgb urine dipstick: NEGATIVE
Ketones, ur: NEGATIVE mg/dL
Leukocytes, UA: NEGATIVE
Nitrite: NEGATIVE
Protein, ur: NEGATIVE mg/dL
Specific Gravity, Urine: 1.025 (ref 1.005–1.030)
Urobilinogen, UA: 0.2 mg/dL (ref 0.0–1.0)
pH: 6 (ref 5.0–8.0)

## 2011-08-10 LAB — DIFFERENTIAL
Basophils Absolute: 0 10*3/uL (ref 0.0–0.1)
Basophils Relative: 1 % (ref 0–1)
Eosinophils Absolute: 0.2 10*3/uL (ref 0.0–0.7)
Eosinophils Relative: 2 % (ref 0–5)
Lymphocytes Relative: 46 % (ref 12–46)
Lymphs Abs: 3.8 10*3/uL (ref 0.7–4.0)
Monocytes Absolute: 0.6 10*3/uL (ref 0.1–1.0)
Monocytes Relative: 8 % (ref 3–12)
Neutro Abs: 3.6 10*3/uL (ref 1.7–7.7)
Neutrophils Relative %: 44 % (ref 43–77)

## 2011-08-10 MED ORDER — ONDANSETRON HCL 8 MG PO TABS: 8.0000 mg | ORAL_TABLET | ORAL | Status: AC | PRN

## 2011-08-10 MED ORDER — OXYCODONE-ACETAMINOPHEN 5-325 MG PO TABS
1.0000 | ORAL_TABLET | Freq: Four times a day (QID) | ORAL | Status: AC | PRN
Start: 2011-08-10 — End: 2011-08-20

## 2011-08-10 MED ORDER — FENTANYL CITRATE 0.05 MG/ML IJ SOLN
50.0000 ug | Freq: Once | INTRAMUSCULAR | Status: AC
  Administered 2011-08-10: 50 ug via INTRAVENOUS
  Filled 2011-08-10: qty 2

## 2011-08-10 MED ORDER — IOHEXOL 300 MG/ML  SOLN
100.0000 mL | Freq: Once | INTRAMUSCULAR | Status: AC | PRN
  Administered 2011-08-10: 100 mL via INTRAVENOUS

## 2011-08-10 MED ORDER — ONDANSETRON HCL 4 MG/2ML IJ SOLN
4.0000 mg | Freq: Once | INTRAMUSCULAR | Status: AC
  Administered 2011-08-10: 4 mg via INTRAVENOUS
  Filled 2011-08-10: qty 2

## 2011-08-10 NOTE — Discharge Instructions (Signed)
Abdominal Pain Abdominal pain can be caused by many things. Your caregiver decides the seriousness of your pain by an examination and possibly blood tests and X-rays. Many cases can be observed and treated at home. Most abdominal pain is not caused by a disease and will probably improve without treatment. However, in many cases, more time must pass before a clear cause of the pain can be found. Before that point, it may not be known if you need more testing, or if hospitalization or surgery is needed. HOME CARE INSTRUCTIONS   Do not take laxatives unless directed by your caregiver.   Take pain medicine only as directed by your caregiver.   Only take over-the-counter or prescription medicines for pain, discomfort, or fever as directed by your caregiver.   Try a clear liquid diet (broth, tea, or water) for as long as directed by your caregiver. Slowly move to a bland diet as tolerated.  SEEK IMMEDIATE MEDICAL CARE IF:   The pain does not go away.   You have a fever.   You keep throwing up (vomiting).   The pain is felt only in portions of the abdomen. Pain in the right side could possibly be appendicitis. In an adult, pain in the left lower portion of the abdomen could be colitis or diverticulitis.   You pass bloody or black tarry stools.  MAKE SURE YOU:   Understand these instructions.   Will watch your condition.   Will get help right away if you are not doing well or get worse.  Document Released: 01/04/2005 Document Revised: 03/16/2011 Document Reviewed: 11/13/2007 [facility] Patient Information 2012 [place], [place].  CT scan shows no acute findings. Medication for pain and nausea. Followup your primary care Dr.
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
## 2011-08-10 NOTE — ED Notes (Signed)
Pt alert, oriented and cooperative.  Reporting abdominal pain has decreased, however has not completely resolved.  Pain 4/10.  No nausea reported at present time.  IV infusing with no difficulty, site benign.

## 2012-07-02 ENCOUNTER — Other Ambulatory Visit: Payer: Self-pay | Admitting: Family Medicine

## 2012-07-02 DIAGNOSIS — R1031 Right lower quadrant pain: Secondary | ICD-10-CM

## 2012-07-04 ENCOUNTER — Ambulatory Visit
Admission: RE | Admit: 2012-07-04 | Discharge: 2012-07-04 | Disposition: A | Discharge status: Home / Self Care | Payer: 59 | Source: Ambulatory Visit | Attending: Family Medicine | Admitting: Family Medicine

## 2012-07-04 DIAGNOSIS — R1031 Right lower quadrant pain: Secondary | ICD-10-CM

## 2012-07-04 MED ORDER — IOHEXOL 300 MG/ML  SOLN
100.0000 mL | Freq: Once | INTRAMUSCULAR | Status: AC | PRN
  Administered 2012-07-04: 100 mL via INTRAVENOUS

## 2012-08-26 ENCOUNTER — Emergency Department (HOSPITAL_COMMUNITY): Payer: 59

## 2012-08-26 ENCOUNTER — Encounter (HOSPITAL_COMMUNITY): Payer: Self-pay | Admitting: *Deleted

## 2012-08-26 ENCOUNTER — Emergency Department (HOSPITAL_COMMUNITY)
Admission: EM | Admit: 2012-08-26 | Discharge: 2012-08-26 | Disposition: A | Discharge status: Home / Self Care | Payer: 59 | Attending: Emergency Medicine | Admitting: Emergency Medicine

## 2012-08-26 DIAGNOSIS — E7800 Pure hypercholesterolemia, unspecified: Secondary | ICD-10-CM | POA: Insufficient documentation

## 2012-08-26 DIAGNOSIS — R5381 Other malaise: Secondary | ICD-10-CM | POA: Insufficient documentation

## 2012-08-26 DIAGNOSIS — R110 Nausea: Secondary | ICD-10-CM | POA: Insufficient documentation

## 2012-08-26 DIAGNOSIS — Z79899 Other long term (current) drug therapy: Secondary | ICD-10-CM | POA: Insufficient documentation

## 2012-08-26 DIAGNOSIS — H53149 Visual discomfort, unspecified: Secondary | ICD-10-CM | POA: Insufficient documentation

## 2012-08-26 DIAGNOSIS — G43909 Migraine, unspecified, not intractable, without status migrainosus: Secondary | ICD-10-CM | POA: Insufficient documentation

## 2012-08-26 DIAGNOSIS — Z87891 Personal history of nicotine dependence: Secondary | ICD-10-CM | POA: Insufficient documentation

## 2012-08-26 DIAGNOSIS — Z8739 Personal history of other diseases of the musculoskeletal system and connective tissue: Secondary | ICD-10-CM | POA: Insufficient documentation

## 2012-08-26 DIAGNOSIS — R5383 Other fatigue: Secondary | ICD-10-CM | POA: Insufficient documentation

## 2012-08-26 HISTORY — DX: Pure hypercholesterolemia, unspecified: E78.00

## 2012-08-26 HISTORY — DX: Migraine, unspecified, not intractable, without status migrainosus: G43.909

## 2012-08-26 MED ORDER — KETOROLAC TROMETHAMINE 30 MG/ML IJ SOLN
15.0000 mg | Freq: Once | INTRAMUSCULAR | Status: AC
  Administered 2012-08-26: 15 mg via INTRAVENOUS
  Filled 2012-08-26: qty 1

## 2012-08-26 MED ORDER — SODIUM CHLORIDE 0.9 % IV BOLUS (SEPSIS)
1000.0000 mL | Freq: Once | INTRAVENOUS | Status: AC
  Administered 2012-08-26: 1000 mL via INTRAVENOUS

## 2012-08-26 MED ORDER — HYDROCODONE-ACETAMINOPHEN 5-325 MG PO TABS: 1.0000 | ORAL_TABLET | ORAL | Status: DC | PRN

## 2012-08-26 MED ORDER — MORPHINE SULFATE 4 MG/ML IJ SOLN
3.0000 mg | Freq: Once | INTRAMUSCULAR | Status: AC
  Administered 2012-08-26: 3 mg via INTRAVENOUS
  Filled 2012-08-26: qty 1

## 2012-08-26 MED ORDER — DIPHENHYDRAMINE HCL 50 MG/ML IJ SOLN
50.0000 mg | Freq: Once | INTRAMUSCULAR | Status: AC
  Administered 2012-08-26: 50 mg via INTRAMUSCULAR
  Filled 2012-08-26: qty 1

## 2012-08-26 MED ORDER — PROMETHAZINE HCL 25 MG/ML IJ SOLN
12.5000 mg | Freq: Once | INTRAMUSCULAR | Status: AC
  Administered 2012-08-26: 12.5 mg via INTRAVENOUS
  Filled 2012-08-26: qty 1

## 2012-08-26 MED ORDER — DEXAMETHASONE SODIUM PHOSPHATE 4 MG/ML IJ SOLN
INTRAMUSCULAR | Status: AC
  Administered 2012-08-26: 10 mg via INTRAMUSCULAR
  Filled 2012-08-26: qty 3

## 2012-08-26 MED ORDER — MORPHINE SULFATE 4 MG/ML IJ SOLN
4.0000 mg | Freq: Once | INTRAMUSCULAR | Status: AC
  Administered 2012-08-26: 4 mg via INTRAVENOUS
  Filled 2012-08-26: qty 1

## 2012-08-26 MED ORDER — DEXAMETHASONE SODIUM PHOSPHATE 4 MG/ML IJ SOLN
10.0000 mg | Freq: Once | INTRAMUSCULAR | Status: AC
  Administered 2012-08-26: 10 mg via INTRAMUSCULAR

## 2012-08-26 MED ORDER — ONDANSETRON 8 MG PO TBDP
8.0000 mg | ORAL_TABLET | Freq: Once | ORAL | Status: AC
  Administered 2012-08-26: 8 mg via ORAL
  Filled 2012-08-26: qty 1

## 2012-08-26 NOTE — ED Notes (Signed)
Headache, nausea, with visual disturbance, "seeing squiggles at times".  Has had this happen before with headache,  Feels like migraine she has had in past, No HI,

## 2012-08-26 NOTE — ED Notes (Signed)
J. Idol, PA at bedside. 

## 2012-08-26 NOTE — ED Notes (Signed)
Ginger ale provided per pt's request. 

## 2012-08-27 NOTE — ED Provider Notes (Signed)
History     CSN: [id number]  Arrival date & time 08/26/12  1130   First MD Initiated Contact with Patient 08/26/12 1230      Chief Complaint  Patient presents with  . Headache    (Consider location/radiation/quality/duration/timing/severity/associated sxs/prior treatment) HPI Comments: Jasmine Allen is a 47 y.o. Female presenting with migraine headache which started several days ago and has not abated despite resting and use of esgic, which generally relieves her migraine headches.  She reports having increased headaches this past month and has just discovered her last refill of her progesterone hormone replacement which she has been taking for the past 2 weeks was double her usual dose.  Both she and her pcp suspect this may be causing the escalating headaches.  She returned to her regular dose yesterday.  The patient has a history of migraine and the current symptoms are similar to previous episodes of migraine headache.  The patients symptoms are preceded by scotoma.  The patient has bilateral headache radiating to her posterior scalp and has associated nausea without emesis, photophobia and reduced oral intake today.  There has been no fevers, chills, syncope, confusion or localized weakness.  The patient tried esgic without relief of symptoms.      The history is provided by the patient and the spouse.    Past Medical History  Diagnosis Date  . Bulging disc   . Hypercholesteremia   . Migraine     Past Surgical History  Procedure Laterality Date  . Knee surgery    . Tonsillectomy    . Uterine ablation     . Cardiac catheterization      History reviewed. No pertinent family history.  History  Substance Use Topics  . Smoking status: Former Games developer  . Smokeless tobacco: Not on file  . Alcohol Use: Yes     Comment: rare    OB History   Grav Para Term Preterm Abortions TAB SAB Ect Mult Living                  Review of Systems  Constitutional: Negative for  fever and chills.  HENT: Negative for congestion, sore throat, neck pain and neck stiffness.   Eyes: Negative.   Respiratory: Negative for chest tightness and shortness of breath.   Cardiovascular: Negative for chest pain.  Gastrointestinal: Negative for nausea and abdominal pain.  Genitourinary: Negative.   Musculoskeletal: Negative for joint swelling and arthralgias.  Skin: Negative.  Negative for rash and wound.  Neurological: Positive for weakness and headaches. Negative for dizziness, light-headedness and numbness.  Psychiatric/Behavioral: Negative.     Allergies  Amoxicillin-pot clavulanate; Metoclopramide hcl; Erythromycin; and Sulfonamide derivatives  Home Medications   Current Outpatient Rx  Name  Route  Sig  Dispense  Refill  . acetaminophen (TYLENOL) 500 MG tablet   Oral   Take 1,000 mg by mouth every 6 (six) hours as needed for pain.         Marland Kitchen atorvastatin (LIPITOR) 10 MG tablet   Oral   Take 10 mg by mouth at bedtime.         . Biotin 5000 MCG CAPS   Oral   Take 5,000 mcg by mouth daily.         . butalbital-acetaminophen-caffeine (ESGIC PLUS) 50-500-40 MG per tablet   Oral   Take 1 tablet by mouth every 4 (four) hours as needed for pain or headache.         . cetirizine (ZYRTEC) 10  MG tablet   Oral   Take 10 mg by mouth daily.         Marland Kitchen estradiol (ESTRACE) 2 MG tablet   Oral   Take 2 mg by mouth at bedtime.         . hydrochlorothiazide (HYDRODIURIL) 25 MG tablet   Oral   Take 25 mg by mouth daily.           . Multiple Vitamin (MULITIVITAMIN WITH MINERALS) TABS   Oral   Take 1 tablet by mouth daily.         . naproxen sodium (ALEVE) 220 MG tablet   Oral   Take 220 mg by mouth 2 (two) times daily as needed (Pain).         . progesterone (PROMETRIUM) 100 MG capsule   Oral   Take 100 mg by mouth at bedtime.         . Pseudoephedrine-Ibuprofen 30-200 MG TABS   Oral   Take 1 tablet by mouth every 6 (six) hours as needed  (Congestion, Pain).         Marland Kitchen HYDROcodone-acetaminophen (NORCO/VICODIN) 5-325 MG per tablet   Oral   Take 1 tablet by mouth every 4 (four) hours as needed for pain.   12 tablet   0     BP 123/66  Pulse 60  Temp(Src) 97.7 F (36.5 C) (Oral)  Resp 18  Ht 5' (1.524 m)  Wt 120 lb (54.432 kg)  BMI 23.44 kg/m2  SpO2 100%  Physical Exam  Nursing note and vitals reviewed. Constitutional: She is oriented to person, place, and time. She appears well-developed and well-nourished.  Uncomfortable appearing, lying in darkened room  HENT:  Head: Normocephalic and atraumatic.  Mouth/Throat: Oropharynx is clear and moist.  Eyes: EOM are normal. Pupils are equal, round, and reactive to light.  Neck: Normal range of motion. Neck supple.  Cardiovascular: Normal rate and normal heart sounds.   Pulmonary/Chest: Effort normal.  Abdominal: Soft. There is no tenderness.  Musculoskeletal: Normal range of motion.  Lymphadenopathy:    She has no cervical adenopathy.  Neurological: She is alert and oriented to person, place, and time. She has normal strength. No sensory deficit. Gait normal. GCS eye subscore is 4. GCS verbal subscore is 5. GCS motor subscore is 6.  Normal heel-shin, normal rapid alternating movements. Cranial nerves III-XII intact.  No pronator drift.  Skin: Skin is warm and dry. No rash noted.  Psychiatric: She has a normal mood and affect. Her speech is normal and behavior is normal. Thought content normal. Cognition and memory are normal.    ED Course  Procedures (including critical care time)  Labs Reviewed - No data to display Ct Head Wo Contrast  08/26/2012   *RADIOLOGY REPORT*  Clinical Data: Headache  CT HEAD WITHOUT CONTRAST  Technique:  Contiguous axial images were obtained from the base of the skull through the vertex without contrast.  Comparison: February 17, 2010.  Findings: Bony calvarium appears intact. No mass effect or midline shift is noted.  Ventricular size is  within normal limits.  There is no evidence of mass lesion, hemorrhage or acute infarction.  IMPRESSION: No acute intracranial abnormality seen.   Original Report Authenticated By: Lupita Raider.,  M.D.     1. Migraine      Medications  ondansetron (ZOFRAN-ODT) disintegrating tablet 8 mg (8 mg Oral Given 08/26/12 1249)  dexamethasone (DECADRON) injection 10 mg (10 mg Intramuscular Given 08/26/12 1251)  diphenhydrAMINE (BENADRYL)  injection 50 mg (50 mg Intramuscular Given 08/26/12 1250)  sodium chloride 0.9 % bolus 1,000 mL (0 mLs Intravenous Stopped 08/26/12 1520)  promethazine (PHENERGAN) injection 12.5 mg (12.5 mg Intravenous Given 08/26/12 1406)  ketorolac (TORADOL) 30 MG/ML injection 15 mg (15 mg Intravenous Given 08/26/12 1406)  morphine 4 MG/ML injection 4 mg (4 mg Intravenous Given 08/26/12 1520)  morphine 4 MG/ML injection 3 mg (3 mg Intravenous Given 08/26/12 1657)    Pt was given benadyl and decadron IM,  zofran odt with no relief of headache or nausea.  She was moved to the acute care side at which time she was given a 1 liter bolus of NS with phenergan 12.5 mg and toradol 30 mg IV with relief of nausea,  But persistent headache.  Morphine 4 mg IV given which pt states headache improved from 9/10 to 3/10.    Ct head completed as pt did not respond to tx as expected,  Normal Ct results.  Given morphine 3 mg IV prior to dc home.  She tolerated PO fluids prior to dc.  MDM  Acute migraine with normal Ct and normal exam without neurologic findings.  She was prescribed a small quantity of hydrocodone if headache persists.  Encouraged recheck by pcp in one week if her headaches persist once her progesterone levels normalize.        Burgess Amor, PA-C 08/27/12 [phone]

## 2012-08-29 NOTE — ED Provider Notes (Signed)
Medical screening examination/treatment/procedure(s) were performed by non-physician practitioner and as supervising physician I was immediately available for consultation/collaboration.   Shelda Jakes, MD 08/29/12 [phone]

## 2012-10-04 ENCOUNTER — Encounter: Payer: Self-pay | Admitting: Gastroenterology

## 2012-10-09 ENCOUNTER — Encounter: Payer: Self-pay | Admitting: *Deleted

## 2012-10-09 NOTE — Telephone Encounter (Signed)
error 

## 2012-10-17 ENCOUNTER — Ambulatory Visit: Payer: 59 | Admitting: Gastroenterology

## 2012-10-31 ENCOUNTER — Ambulatory Visit (INDEPENDENT_AMBULATORY_CARE_PROVIDER_SITE_OTHER): Payer: 59 | Admitting: Gastroenterology

## 2012-10-31 ENCOUNTER — Encounter: Payer: Self-pay | Admitting: Gastroenterology

## 2012-10-31 VITALS — BP 110/80 | HR 72 | Ht 60.0 in | Wt 124.2 lb

## 2012-10-31 DIAGNOSIS — R109 Unspecified abdominal pain: Secondary | ICD-10-CM

## 2012-10-31 DIAGNOSIS — Z8719 Personal history of other diseases of the digestive system: Secondary | ICD-10-CM

## 2012-10-31 DIAGNOSIS — R1011 Right upper quadrant pain: Secondary | ICD-10-CM

## 2012-10-31 DIAGNOSIS — G8929 Other chronic pain: Secondary | ICD-10-CM

## 2012-10-31 DIAGNOSIS — K5730 Diverticulosis of large intestine without perforation or abscess without bleeding: Secondary | ICD-10-CM

## 2012-10-31 DIAGNOSIS — Z87442 Personal history of urinary calculi: Secondary | ICD-10-CM

## 2012-10-31 DIAGNOSIS — K219 Gastro-esophageal reflux disease without esophagitis: Secondary | ICD-10-CM

## 2012-10-31 NOTE — Progress Notes (Signed)
History of Present Illness:  This is a 47 year old Caucasian female with chronic right quadrant discomfort for many years.  She has gone approximately one to 2 years without pain, but her problems ever since with crampy right upper quadrant pain one to 2 times a week lasting anywhere from a few hours to a day in duration with mild nausea but no emesis.  She's been evaluated by urology and does have kidney stones, but does not have abnormalities apparently to explain her right flank pain.  Recent CT scan reviewed and was unremarkable otherwise.  Gallbladder was not well visualized on CT scan.  Scan reviewed in also normal except for evidence of diverticulosis. I previously worked this patient up years ago when she had IBS negative endoscopy and colonoscopy.  She denies clay-colored stools, dark urine, icterus, fever, chills, anorexia weight loss, relationship of her pain to food.  She currently is on Macrodantin 100 mg a day for UT has and does real 100 mg a day.  She denies abuse of alcohol, NSAIDs or cigarettes.  Is been no associated weight loss or specific food intolerances.  She denies gynecologic problems.  Review of her labs shows normal liver function tests and no specific abnormalities otherwise.  I have reviewed this patient's present history, medical and surgical past history, allergies and medications.     ROS:   All systems were reviewed and are negative unless otherwise stated in the HPI.    Physical Exam: Blood pressure 110 or 80, pulse 72 and regular and weight 124 the BMI of 24.27. General well developed well nourished patient in no acute distress, appearing their stated age Eyes PERRLA, no icterus, fundoscopic exam per opthamologist Skin no lesions noted Neck supple, no adenopathy, no thyroid enlargement, no tenderness Chest clear to percussion and auscultation Heart no significant murmurs, gallops or rubs noted Abdomen no hepatosplenomegaly masses or tenderness, BS normal.   Extremities no acute joint lesions, edema, phlebitis or evidence of cellulitis. Neurologic patient oriented x 3, cranial nerves intact, no focal neurologic deficits noted. Psychological mental status normal and normal affect.  Assessment and plan: Her abdominal pain is very atypical, and I suspect she does have chronic cholecystitis.  I have set her up for standard upper abdominal ultrasound exam, if this is unremarkable we'll proceed with CCK-HIDA scan.  She is on daily Nexium for dyspepsia.  She also complains of hair loss, and has a dermatology appointment later this week.  Other medications include HCTZ, Estrace, Zyrtec, Valtrex, and multivitamins.  Encounter Diagnosis  Name Primary?  . Abdominal pain, unspecified site Yes

## 2012-10-31 NOTE — Patient Instructions (Addendum)
You have been scheduled for an abdominal ultrasound/HIDA at [facility] (1st floor of hospital) on Friday, 11/15/12 at 8:30 am. Please arrive 15 minutes prior to your appointment for registration. Make certain not to have anything to eat or drink 6 hours prior to your appointment. Should you need to reschedule your appointment, please contact radiology at [phone].  _____________________________________________________________________ hepatobiliary (HIDA) scan is an imaging procedure used to diagnose problems in the liver, gallbladder and bile ducts. In the HIDA scan, a radioactive chemical or tracer is injected into a vein in your arm. The tracer is handled by the liver like bile. Bile is a fluid produced and excreted by your liver that helps your digestive system break down fats in the foods you eat. Bile is stored in your gallbladder and the gallbladder releases the bile when you eat a meal. A special nuclear medicine scanner (gamma camera) tracks the flow of the tracer from your liver into your gallbladder and small intestine.  During your HIDA scan  You'll be asked to change into a hospital gown before your HIDA scan begins. Your health care team will position you on a table, usually on your back. The radioactive tracer is then injected into a vein in your arm.The tracer travels through your bloodstream to your liver, where it's taken up by the bile-producing cells. The radioactive tracer travels with the bile from your liver into your gallbladder and through your bile ducts to your small intestine.You may feel some pressure while the radioactive tracer is injected into your vein. As you lie on the table, a special gamma camera is positioned over your abdomen taking pictures of the tracer as it moves through your body. The gamma camera takes pictures continually for about an hour. You'll need to keep still during the HIDA scan. This can become uncomfortable, but you may find that you can lessen  the discomfort by taking deep breaths and thinking about other things. Tell your health care team if you're uncomfortable. The radiologist will watch on a computer the progress of the radioactive tracer through your body. The HIDA scan may be stopped when the radioactive tracer is seen in the gallbladder and enters your small intestine. This typically takes about an hour. In some cases extra imaging will be performed if original images aren't satisfactory, if morphine is given to help visualize the gallbladder or if the medication CCK is given to look at the contraction of the gallbladder. This test typically takes 2 hours to complete. ________________________________________________________________________   CC: Dr Mady Gemma

## 2012-11-06 ENCOUNTER — Telehealth: Payer: Self-pay | Admitting: Gastroenterology

## 2012-11-06 NOTE — Telephone Encounter (Signed)
lmom for pt to call back

## 2012-11-06 NOTE — Telephone Encounter (Signed)
Do celiac panel

## 2012-11-06 NOTE — Telephone Encounter (Signed)
Pt had questions about Celiac disease and I will mail her info on the disease. Pt is scheduled for an u/s and if normal, a HIDA scan. Pt wants to know if she could have the Celiac panel run? Thanks

## 2012-11-12 ENCOUNTER — Telehealth: Payer: Self-pay | Admitting: *Deleted

## 2012-11-12 DIAGNOSIS — R109 Unspecified abdominal pain: Secondary | ICD-10-CM

## 2012-11-14 ENCOUNTER — Telehealth: Payer: Self-pay | Admitting: Gastroenterology

## 2012-11-14 NOTE — Telephone Encounter (Signed)
Spoke with pt to inform her I was going to call her insurance company to ask about her HIDA SCAN denial, but it has been cancelled. She states she changed the U/S appt until her kids are back in school. She will wait until those results are in and try the Gluten Diet I sent her.

## 2012-11-15 ENCOUNTER — Encounter (HOSPITAL_COMMUNITY): Payer: 59

## 2012-11-15 ENCOUNTER — Ambulatory Visit (HOSPITAL_COMMUNITY): Payer: 59

## 2012-11-18 ENCOUNTER — Other Ambulatory Visit: Payer: 59

## 2012-11-18 DIAGNOSIS — R109 Unspecified abdominal pain: Secondary | ICD-10-CM

## 2012-11-19 LAB — CELIAC PANEL 10
Endomysial Screen: NEGATIVE
Gliadin IgA: 6 U/mL (ref <20)
Gliadin IgG: 8.2 U/mL (ref <20)
IgA: 161 mg/dL (ref 69–380)
Tissue Transglut Ab: 5.9 U/mL (ref <20)
Tissue Transglutaminase Ab, IgA: 3 U/mL (ref <20)

## 2012-11-20 ENCOUNTER — Telehealth: Payer: Self-pay | Admitting: *Deleted

## 2012-11-20 NOTE — Telephone Encounter (Signed)
Pt called and  asked for lab results and the celiac panel was negative. She asked about other tests for Celiac and states she took an on line quiz and she had 7 out of 10 symptoms including bloating and trouble sleeping. Informed her a bx can be taken during an EGD for another test. Pt had previous EGDs in 1998 and 2001 which I cannot pul up at this time; CORI down. Dr Jarold Motto, would you consider  An EGD on pt? Thanks.

## 2012-11-20 NOTE — Telephone Encounter (Signed)
See previous encounter

## 2012-11-20 NOTE — Telephone Encounter (Signed)
Sure OK

## 2012-11-20 NOTE — Telephone Encounter (Signed)
Scheduled pt for PV on 11/25/12 and her EGD on 11/27/12

## 2012-11-25 ENCOUNTER — Ambulatory Visit (AMBULATORY_SURGERY_CENTER): Payer: 59 | Admitting: *Deleted

## 2012-11-25 ENCOUNTER — Encounter: Payer: Self-pay | Admitting: Gastroenterology

## 2012-11-25 VITALS — Ht 60.5 in | Wt 119.6 lb

## 2012-11-25 DIAGNOSIS — R109 Unspecified abdominal pain: Secondary | ICD-10-CM

## 2012-11-25 NOTE — Progress Notes (Signed)
No egg or soy allergy. No anesthesia problems.  

## 2012-11-27 ENCOUNTER — Ambulatory Visit (AMBULATORY_SURGERY_CENTER): Payer: 59 | Admitting: Gastroenterology

## 2012-11-27 ENCOUNTER — Encounter: Payer: Self-pay | Admitting: Gastroenterology

## 2012-11-27 VITALS — BP 120/77 | HR 54 | Temp 97.7°F | Resp 33 | Ht 60.5 in | Wt 119.0 lb

## 2012-11-27 DIAGNOSIS — D130 Benign neoplasm of esophagus: Secondary | ICD-10-CM

## 2012-11-27 DIAGNOSIS — R109 Unspecified abdominal pain: Secondary | ICD-10-CM

## 2012-11-27 DIAGNOSIS — K2970 Gastritis, unspecified, without bleeding: Secondary | ICD-10-CM

## 2012-11-27 DIAGNOSIS — K219 Gastro-esophageal reflux disease without esophagitis: Secondary | ICD-10-CM

## 2012-11-27 DIAGNOSIS — D1330 Benign neoplasm of unspecified part of small intestine: Secondary | ICD-10-CM

## 2012-11-27 MED ORDER — SODIUM CHLORIDE 0.9 % IV SOLN: 500.0000 mL | INTRAVENOUS | Status: DC

## 2012-11-27 MED ORDER — ESOMEPRAZOLE MAGNESIUM 40 MG PO CPDR: 40.0000 mg | DELAYED_RELEASE_CAPSULE | Freq: Every day | ORAL | Status: DC

## 2012-11-27 NOTE — Progress Notes (Signed)
Patient denies any allergies to eggs or soy. Patient denies any problems with anesthesia.  

## 2012-11-27 NOTE — Progress Notes (Signed)
Called to room to assist during endoscopic procedure.  Patient ID and intended procedure confirmed with present staff. Received instructions for my participation in the procedure from the performing physician.  

## 2012-11-27 NOTE — Progress Notes (Signed)
Lidocaine-40mg  IV prior to Propofol InductionPropofol given over incremental dosagesDiscussed possibility of jaw displacement with patient during anesthetic to maintain patent airway. They verbally understand and consent. Aware of possible jaw discomfort immediately post-op or at home within the next 24 hour period. Pain and soreness will resolve on its own. Prolonged discomfort they are aware to notify clinic hotline for follow-up.

## 2012-11-27 NOTE — Progress Notes (Signed)
Patient did not experience any of the following events: a burn prior to discharge; a fall within the facility; wrong site/side/patient/procedure/implant event; or a hospital transfer or hospital admission upon discharge from the facility. (G8907) Patient did not have preoperative order for IV antibiotic SSI prophylaxis. (G8918)  

## 2012-11-27 NOTE — Op Note (Signed)
[facility] [address] [place], [address]   ENDOSCOPY PROCEDURE REPORT  PATIENT: [name], [name]  MR#: [id number] BIRTHDATE: Apr 04, 1966 , 47  yrs. old GENDER: Female ENDOSCOPIST:Lando Alcalde Hale Bogus, MD, [facility] REFERRED BY: PROCEDURE DATE:  11/27/2012 PROCEDURE:   EGD w/ biopsy and EGD w/ biopsy for H.pylori ASA CLASS:    Class II INDICATIONS: Chest pain and history of GERD. MEDICATION: propofol (Diprivan) 100mg  IV TOPICAL ANESTHETIC:  DESCRIPTION OF PROCEDURE:   After the risks and benefits of the procedure were explained, informed consent was obtained.  The LB [id number]  endoscope was introduced through the mouth  and advanced to the second portion of the duodenum .  The instrument was slowly withdrawn as the mucosa was fully examined.      DUODENUM: The duodenal mucosa showed no abnormalities in the bulb and second portion of the duodenum.  Cold forceps biopsies were taken in the bulb and second portion.  STOMACH: There was mild antral gastropathy noted with erosions notrd..  Cold forcep biopsies were taken at the antrum. for CLO biopsy.  ESOPHAGUS: The mucosa of the esophagus appeared normal. Retroflexed views revealed no abnormalities.    The scope was then withdrawn from the patient and the procedure completed.  COMPLICATIONS: There were no complications.   ENDOSCOPIC IMPRESSION: 1.   The duodenal mucosa showed no abnormalities in the bulb and second portion of the duodenum ..r/o sprue 2.   There was mild antral gastropathy noted [T2] ..r/o H.pylori 3.   The mucosa of the esophagus appeared normal..r/o eosinophilic esophagitis  RECOMMENDATIONS: 1.  Await pathology results 2.  Continue current medications 3,.Finish GB evaluations as scheduled...    _______________________________ eSignedMardella Layman, MD, [facility] 11/27/2012 8:48 AM      PATIENT NAME:  Jasmine, Allen MR#: [id number]

## 2012-11-27 NOTE — Patient Instructions (Addendum)

## 2012-11-28 ENCOUNTER — Telehealth: Payer: Self-pay | Admitting: *Deleted

## 2012-11-28 LAB — HELICOBACTER PYLORI SCREEN-BIOPSY: UREASE: NEGATIVE

## 2012-11-28 NOTE — Telephone Encounter (Signed)
No answer, left message to call if questions or concerns. 

## 2012-11-29 ENCOUNTER — Encounter: Payer: Self-pay | Admitting: Gastroenterology

## 2012-12-04 ENCOUNTER — Encounter: Payer: Self-pay | Admitting: Gastroenterology

## 2012-12-11 ENCOUNTER — Ambulatory Visit (HOSPITAL_COMMUNITY): Payer: 59

## 2013-03-26 ENCOUNTER — Telehealth: Payer: Self-pay | Admitting: *Deleted

## 2013-03-26 NOTE — Telephone Encounter (Signed)
Form faxed from [employer] that prior auth is needed for patients Nexium I called [phone] and spoke with Trula Ore  Per Christina patient's Nexium is approved for 24 months Per [facility] pharmacy was notified  Reference number::[id number]

## 2013-12-09 ENCOUNTER — Other Ambulatory Visit: Payer: Self-pay | Admitting: Gastroenterology

## 2013-12-16 ENCOUNTER — Encounter: Payer: Self-pay | Admitting: Podiatry

## 2013-12-16 ENCOUNTER — Ambulatory Visit (INDEPENDENT_AMBULATORY_CARE_PROVIDER_SITE_OTHER): Payer: 59 | Admitting: Podiatry

## 2013-12-16 VITALS — BP 125/79 | HR 75 | Resp 16 | Ht 61.0 in | Wt 105.0 lb

## 2013-12-16 DIAGNOSIS — B079 Viral wart, unspecified: Secondary | ICD-10-CM

## 2013-12-16 MED ORDER — HYDROCODONE-ACETAMINOPHEN 5-325 MG PO TABS: 1.0000 | ORAL_TABLET | Freq: Four times a day (QID) | ORAL | Status: DC | PRN

## 2013-12-16 NOTE — Progress Notes (Signed)
   Subjective:    Patient ID: Jasmine Allen, female    DOB: 12-19-1965, 48 y.o.   MRN: [id number]  HPI Comments: PLACE ON THE BOTTOM OF THE LEFT FOOT, LEFT PLANTAR 1ST MET. IT STARTED IN June AND HAS GOT WORSE. NO TREATMENT FOR IT, PAINFUL TO WALK ON IT      Review of Systems  All other systems reviewed and are negative.      Objective:   Physical Exam: I have reviewed her past medical history medications allergies surgeries social history and review systems pulses are strongly palpable bilateral. Neurologic sensorium is intact per Semmes-Weinstein monofilament. Deep tendon reflexes are intact bilateral muscle strength is 5 over 5 dorsiflexors plantar flexors inverters everters all intrinsic musculature is intact. Cutaneous evaluation demonstrates supple well hydrated cutis she has a verrucoid lesion wore a porokeratotic lesion sub-first metatarsophalangeal joint of the left foot. Skin lines do not circumvent the lesion however this doesn't probe deep much like a wart.          Assessment & Plan:  Assessment: Verruca plantaris versus porokeratosis sub-first metatarsophalangeal joint left foot.  Plan: Surgical curettage was performed today after 3 cc of 50-50 mixture Marcaine plain lidocaine with epinephrine were infiltrated sublesionally. The lesion was removed after sterile Betadine skin prep and sent for pathologic evaluation dressed a compressive dressing was applied. I will followup with her in one week.

## 2013-12-16 NOTE — Patient Instructions (Signed)

## 2013-12-17 ENCOUNTER — Telehealth: Payer: Self-pay | Admitting: *Deleted

## 2013-12-17 NOTE — Telephone Encounter (Signed)
Skin wedge plantar left foot sent to [facility] to rule out Verruca.  [facility] [place] [address] Ph:  [phone]

## 2013-12-28 ENCOUNTER — Emergency Department (HOSPITAL_COMMUNITY)
Admission: EM | Admit: 2013-12-28 | Discharge: 2013-12-28 | Disposition: A | Discharge status: Home / Self Care | Payer: 59 | Attending: Emergency Medicine | Admitting: Emergency Medicine

## 2013-12-28 DIAGNOSIS — R6883 Chills (without fever): Secondary | ICD-10-CM | POA: Insufficient documentation

## 2013-12-28 DIAGNOSIS — Z872 Personal history of diseases of the skin and subcutaneous tissue: Secondary | ICD-10-CM | POA: Insufficient documentation

## 2013-12-28 DIAGNOSIS — E7800 Pure hypercholesterolemia, unspecified: Secondary | ICD-10-CM | POA: Insufficient documentation

## 2013-12-28 DIAGNOSIS — Z880 Allergy status to penicillin: Secondary | ICD-10-CM | POA: Insufficient documentation

## 2013-12-28 DIAGNOSIS — R350 Frequency of micturition: Secondary | ICD-10-CM | POA: Insufficient documentation

## 2013-12-28 DIAGNOSIS — Z3202 Encounter for pregnancy test, result negative: Secondary | ICD-10-CM | POA: Insufficient documentation

## 2013-12-28 DIAGNOSIS — G43909 Migraine, unspecified, not intractable, without status migrainosus: Secondary | ICD-10-CM | POA: Insufficient documentation

## 2013-12-28 DIAGNOSIS — R110 Nausea: Secondary | ICD-10-CM | POA: Diagnosis not present

## 2013-12-28 DIAGNOSIS — K219 Gastro-esophageal reflux disease without esophagitis: Secondary | ICD-10-CM | POA: Diagnosis not present

## 2013-12-28 DIAGNOSIS — Z79899 Other long term (current) drug therapy: Secondary | ICD-10-CM | POA: Diagnosis not present

## 2013-12-28 DIAGNOSIS — F3289 Other specified depressive episodes: Secondary | ICD-10-CM | POA: Diagnosis not present

## 2013-12-28 DIAGNOSIS — R109 Unspecified abdominal pain: Secondary | ICD-10-CM | POA: Diagnosis not present

## 2013-12-28 DIAGNOSIS — Z87442 Personal history of urinary calculi: Secondary | ICD-10-CM | POA: Diagnosis not present

## 2013-12-28 DIAGNOSIS — IMO0002 Reserved for concepts with insufficient information to code with codable children: Secondary | ICD-10-CM | POA: Insufficient documentation

## 2013-12-28 DIAGNOSIS — F411 Generalized anxiety disorder: Secondary | ICD-10-CM | POA: Diagnosis not present

## 2013-12-28 DIAGNOSIS — F329 Major depressive disorder, single episode, unspecified: Secondary | ICD-10-CM | POA: Diagnosis not present

## 2013-12-28 DIAGNOSIS — Z862 Personal history of diseases of the blood and blood-forming organs and certain disorders involving the immune mechanism: Secondary | ICD-10-CM | POA: Diagnosis not present

## 2013-12-28 DIAGNOSIS — R002 Palpitations: Secondary | ICD-10-CM | POA: Diagnosis not present

## 2013-12-28 DIAGNOSIS — Z87891 Personal history of nicotine dependence: Secondary | ICD-10-CM | POA: Diagnosis not present

## 2013-12-28 DIAGNOSIS — Z8739 Personal history of other diseases of the musculoskeletal system and connective tissue: Secondary | ICD-10-CM | POA: Diagnosis not present

## 2013-12-28 DIAGNOSIS — R351 Nocturia: Secondary | ICD-10-CM | POA: Diagnosis not present

## 2013-12-28 LAB — URINALYSIS, ROUTINE W REFLEX MICROSCOPIC
Bilirubin Urine: NEGATIVE
Glucose, UA: NEGATIVE mg/dL
Hgb urine dipstick: NEGATIVE
Ketones, ur: NEGATIVE mg/dL
Leukocytes, UA: NEGATIVE
Nitrite: NEGATIVE
Protein, ur: NEGATIVE mg/dL
Specific Gravity, Urine: <1.005 — ABNORMAL LOW (ref 1.005–1.030)
Urobilinogen, UA: 0.2 mg/dL (ref 0.0–1.0)
pH: 7 (ref 5.0–8.0)

## 2013-12-28 LAB — I-STAT CHEM 8, ED
BUN: 10 mg/dL (ref 6–23)
Calcium, Ion: 1.23 mmol/L (ref 1.12–1.23)
Chloride: 103 mEq/L (ref 96–112)
Creatinine, Ser: 0.7 mg/dL (ref 0.50–1.10)
Glucose, Bld: 98 mg/dL (ref 70–99)
HCT: 45 % (ref 36.0–46.0)
Hemoglobin: 15.3 g/dL — ABNORMAL HIGH (ref 12.0–15.0)
Potassium: 4.4 mEq/L (ref 3.7–5.3)
Sodium: 139 mEq/L (ref 137–147)
TCO2: 27 mmol/L (ref 0–100)

## 2013-12-28 LAB — POC URINE PREG, ED: Preg Test, Ur: NEGATIVE

## 2013-12-28 MED ORDER — HYDROCODONE-ACETAMINOPHEN 5-325 MG PO TABS
1.0000 | ORAL_TABLET | Freq: Once | ORAL | Status: AC
  Administered 2013-12-28: 1 via ORAL
  Filled 2013-12-28: qty 1

## 2013-12-28 MED ORDER — HYDROCODONE-ACETAMINOPHEN 5-325 MG PO TABS: 2.0000 | ORAL_TABLET | ORAL | Status: DC | PRN

## 2013-12-28 MED ORDER — IBUPROFEN 400 MG PO TABS
600.0000 mg | ORAL_TABLET | Freq: Once | ORAL | Status: AC
  Administered 2013-12-28: 600 mg via ORAL
  Filled 2013-12-28: qty 2

## 2013-12-28 MED ORDER — NAPROXEN 375 MG PO TABS
375.0000 mg | ORAL_TABLET | Freq: Two times a day (BID) | ORAL | Status: DC
Start: 2013-12-28 — End: 2014-04-07

## 2013-12-28 NOTE — Discharge Instructions (Signed)
If you were given medicines take as directed.  If you are on coumadin or contraceptives realize their levels and effectiveness is altered by many different medicines.  If you have any reaction (rash, tongues swelling, other) to the medicines stop taking and see a physician.   Please follow up as directed and return to the ER or see a physician for new or worsening symptoms.  Thank you. Filed Vitals:   12/28/13 1031 12/28/13 1200 12/28/13 1230 12/28/13 1300  BP: 121/79 117/77 109/62 128/73  Pulse: 66 50 50 51  Temp: 98.3 F (36.8 C)     TempSrc: Oral     Resp: 17 12 11 13   Height: 5' (1.524 m)     Weight: 103 lb (46.72 kg)     SpO2: 100% 100% 100% 100%

## 2013-12-28 NOTE — ED Provider Notes (Signed)
CSN: [id number]     Arrival date & time 12/28/13  1012 History  This chart was scribed for Mariea Clonts, MD by Tula Nakayama, ED Scribe. This patient was seen in room APA03/APA03 and the patient's care was started at 10:44 AM.   Chief Complaint  Patient presents with  . Flank Pain   The history is provided by the patient. No language interpreter was used.   HPI Comments: EURETHA NAJARRO is a 48 y.o. female who presents to the Emergency Department complaining of mild right flank pain with associated bladder pressure that started 9 hours ago. Pt states she has urinary frequency, nocturia, polydypsia, nausea, chills and palpitations as associated symptoms. Pt has no associated fever. Pt has hx of dehydration and currently takes Lasix which she stopped taking this week. She denies PMHx of kidney infection and UTI. Pt has hx of kidney stones and hypokalema, but states symptoms are not the same. This past week, pt increased Clonidine treatment from 0.1-0.2, but has felt fine. She had normal cardiac catheterization 4 years ago, but no hx of heart problems.    Past Medical History  Diagnosis Date  . Bulging disc   . Hypercholesteremia   . Migraine   . Opioid abuse, in remission   . Depressive disorder, not elsewhere classified   . Esophageal reflux   . Anxiety state, unspecified   . Diverticulosis of colon (without mention of hemorrhage)     CT Scan  . Esophagitis 1998  . Hiatal hernia 1998  . Alopecia, unspecified   . Anemia, unspecified    Past Surgical History  Procedure Laterality Date  . Knee surgery    . Tonsillectomy    . Ablation    . Cardiac catheterization     Family History  Problem Relation Age of Onset  . Colon cancer Neg Hx   . Stomach cancer Neg Hx    History  Substance Use Topics  . Smoking status: Former Smoker    Quit date: 10/25/2009  . Smokeless tobacco: Never Used  . Alcohol Use: Yes     Comment: rare   OB History   Grav Para Term Preterm Abortions  TAB SAB Ect Mult Living                 Review of Systems  Constitutional: Negative for fever and chills.  Cardiovascular: Positive for palpitations.  Gastrointestinal: Positive for nausea. Negative for vomiting.  Genitourinary: Positive for frequency and flank pain.  All other systems reviewed and are negative.     Allergies  Amoxicillin-pot clavulanate; Metoclopramide hcl; Erythromycin; and Sulfonamide derivatives  Home Medications   Prior to Admission medications   Medication Sig Start Date End Date Taking? Authorizing Provider  ALPRAZolam Duanne Moron) 0.25 MG tablet  10/21/13   Historical Provider, MD  atorvastatin (LIPITOR) 10 MG tablet Take 10 mg by mouth at bedtime.    Historical Provider, MD  Biotin 5000 MCG CAPS Take 5,000 mcg by mouth daily.    Historical Provider, MD  butalbital-acetaminophen-caffeine (ESGIC PLUS) 50-500-40 MG per tablet Take 1 tablet by mouth every 4 (four) hours as needed for pain or headache.    Historical Provider, MD  cetirizine (ZYRTEC) 10 MG tablet Take 10 mg by mouth daily.    Historical Provider, MD  esomeprazole (NEXIUM) 40 MG capsule Take 40 mg by mouth daily before breakfast.    Historical Provider, MD  estradiol (ESTRACE) 2 MG tablet Take 2 mg by mouth at bedtime.  Historical Provider, MD  finasteride (PROSCAR) 5 MG tablet  11/20/13   Historical Provider, MD  furosemide (LASIX) 20 MG tablet  10/27/13   Historical Provider, MD  hydrocortisone valerate cream (WESTCORT) 0.2 %  08/21/12   Historical Provider, MD  meloxicam (MOBIC) 15 MG tablet  10/03/12   Historical Provider, MD  Multiple Vitamin (MULITIVITAMIN WITH MINERALS) TABS Take 1 tablet by mouth daily.    Historical Provider, MD  nitrofurantoin (MACRODANTIN) 100 MG capsule  09/26/12   Historical Provider, MD  progesterone (PROMETRIUM) 100 MG capsule Take 100 mg by mouth at bedtime.    Historical Provider, MD  Pseudoephedrine-Ibuprofen 30-200 MG TABS Take 1 tablet by mouth every 6 (six) hours as  needed (Congestion, Pain).    Historical Provider, MD  traZODone (DESYREL) 100 MG tablet  10/17/12   Historical Provider, MD  valACYclovir (VALTREX) 1000 MG tablet  10/28/12   Historical Provider, MD   BP 121/79  Pulse 66  Temp(Src) 98.3 F (36.8 C) (Oral)  Resp 17  Ht 5' (1.524 m)  Wt 103 lb (46.72 kg)  BMI 20.12 kg/m2  SpO2 100% Physical Exam  Constitutional: She appears well-developed and well-nourished. No distress.  HENT:  Head: Normocephalic and atraumatic.  Eyes: Conjunctivae and EOM are normal.  Neck: Neck supple. No tracheal deviation present.  Cardiovascular: Normal rate and regular rhythm.   No murmur heard. Pulmonary/Chest: Effort normal. No respiratory distress.  Abdominal: There is tenderness.  Mild discomfort suprapubic area  Genitourinary:  Mild right flank pain  Skin: Skin is warm and dry.  Psychiatric: She has a normal mood and affect. Her behavior is normal.    ED Course  Procedures (including critical care time) Emergency Focused Ultrasound Exam Limited retroperitoneal ultrasound of kidneys  Performed and interpreted by Dr. Reather Converse Indication: flank pain Focused abdominal ultrasound with both kidneys imaged in transverse and longitudinal planes in real-time. Interpretation: no hydronephrosis visualized.   Images archived electronically  EMERGENCY DEPARTMENT BILIARY ULTRASOUND INTERPRETATION "Study: Limited Abdominal Ultrasound of the gallbladder and common bile duct."  INDICATIONS: Nausea and Back pain Indication: Multiple views of the gallbladder and common bile duct were obtained in real-time with a Multi-frequency probe." PERFORMED BY:  Myself IMAGES ARCHIVED?: Yes FINDINGS: Gallstones absent, Gallbladder wall normal in thickness, Sonographic Murphy's sign absent and Common bile duct normal in size LIMITATIONS: Bowel Gas INTERPRETATION: Normal   DIAGNOSTIC STUDIES: Oxygen Saturation is 100% on RA, normal by my interpretation.    COORDINATION  OF CARE: 10:50 AM Will order UA, EKG, Chem-8. Will administer mild pain medication in the ED. Pt agreed to tx plan.   Labs Review  Labs Reviewed  URINALYSIS, ROUTINE W REFLEX MICROSCOPIC - Abnormal; Notable for the following:    Specific Gravity, Urine <1.005 (*)    All other components within normal limits  I-STAT CHEM 8, ED - Abnormal; Notable for the following:    Hemoglobin 15.3 (*)    All other components within normal limits  POC URINE PREG, ED    Imaging Review No results found.   EKG Interpretation None      MDM   Final diagnoses:  Right flank pain   I personally performed the services described in this documentation, which was scribed in my presence. The recorded information has been reviewed and is accurate.  Patient with right flank pain with frequent urination. Kidney stone history. Patient has urology followup. Pain controlled in ER and reassessment. Bedside ultrasound no hydronephrosis, patient concern for possibly gallbladder as well,  bedside ultrasound no signs of infection or stones. With radiation risk and history of kidney stone with this being similar patient comfortable holding on CT scan at this time will followup with primary Dr. and neurology.  Results and differential diagnosis were discussed with the patient/parent/guardian. Close follow up outpatient was discussed, comfortable with the plan.   Medications  ibuprofen (ADVIL,MOTRIN) tablet 600 mg (600 mg Oral Given 12/28/13 1131)  HYDROcodone-acetaminophen (NORCO/VICODIN) 5-325 MG per tablet 1 tablet (1 tablet Oral Given 12/28/13 1131)    Filed Vitals:   12/28/13 1031 12/28/13 1200 12/28/13 1230 12/28/13 1300  BP: 121/79 117/77 109/62 128/73  Pulse: 66 50 50 51  Temp: 98.3 F (36.8 C)     TempSrc: Oral     Resp: 17 12 11 13   Height: 5' (1.524 m)     Weight: 103 lb (46.72 kg)     SpO2: 100% 100% 100% 100%    Final diagnoses:  Right flank pain      Mariea Clonts, MD 12/28/13 1327

## 2013-12-28 NOTE — ED Notes (Signed)
Patient c/o right lower back/flank pain that started last night. C/o pressure and sensation of fullness in bladder and frequent urination.

## 2013-12-30 ENCOUNTER — Encounter: Payer: Self-pay | Admitting: Podiatry

## 2013-12-30 ENCOUNTER — Telehealth: Payer: Self-pay | Admitting: *Deleted

## 2013-12-30 ENCOUNTER — Ambulatory Visit (INDEPENDENT_AMBULATORY_CARE_PROVIDER_SITE_OTHER): Payer: 59 | Admitting: Podiatry

## 2013-12-30 VITALS — BP 134/74 | HR 64 | Resp 16

## 2013-12-30 DIAGNOSIS — B079 Viral wart, unspecified: Secondary | ICD-10-CM

## 2013-12-30 NOTE — Progress Notes (Signed)
She is 1 week status post surgical excision wart plantar aspect of the left first metatarsophalangeal joint. She has discontinued Betadine soaks and start with Epsom salts and water soaks. She states that he continues to her particularly while soaking. She discontinued salt water soaks as well.  Objective: Vital signs are stable she is alert and oriented x3. There is no erythema edema cellulitis drainage or odor the wound to the plantar aspect of the left forefoot demonstrates contracting margins and epithelialization. This appears to be healing normally at without complication. Her pathology report came back typical for a wart.  Assessment: Well-healing surgical foot left first metatarsophalangeal joint.  Plan: Discussed etiology pathology conservative versus surgical therapies. Encouraged her to continue to soak in Epsom salts in warm water soaks twice daily and apply Bactroban ointment during the day. She's and leave it open at night and will followup with me should it become painful or started demonstrate signs of infection. Should this recur she will notify us immediately.

## 2013-12-30 NOTE — Telephone Encounter (Signed)
I saw Dr. Milinda Pointer this morning.  He diagnosed me or the biopsy came back that it was a Plantar's Wart on my foot.  I meant to ask, do I throw away my flip flops I wore this summer and what's the best way to disinfect my sandals that I wore while I had that on the bottom of my foot?  I would hate to get another one.

## 2013-12-31 NOTE — Telephone Encounter (Signed)
Was the shoes you can and lysol spray the others.

## 2014-01-02 ENCOUNTER — Encounter: Payer: Self-pay | Admitting: Podiatry

## 2014-01-02 NOTE — Telephone Encounter (Signed)
I called and left her a message that Dr. Milinda Pointer said to wash the ones you can and lysol spray the others.  If you have any further questions please give me a call.

## 2014-01-03 ENCOUNTER — Encounter: Payer: Self-pay | Admitting: Gastroenterology

## 2014-03-23 ENCOUNTER — Telehealth: Payer: Self-pay | Admitting: Cardiovascular Disease

## 2014-03-23 NOTE — Telephone Encounter (Signed)
Incoming records received from [facility] on 12.14.15 for Nishan appt on 12.30.15 at 8:15 a.m.Given to the chart prep team on 12.14.15:djc

## 2014-04-05 ENCOUNTER — Emergency Department (HOSPITAL_COMMUNITY)
Admission: EM | Admit: 2014-04-05 | Discharge: 2014-04-05 | Disposition: A | Discharge status: Home / Self Care | Payer: 59 | Attending: Emergency Medicine | Admitting: Emergency Medicine

## 2014-04-05 ENCOUNTER — Encounter (HOSPITAL_COMMUNITY): Payer: Self-pay

## 2014-04-05 DIAGNOSIS — Z862 Personal history of diseases of the blood and blood-forming organs and certain disorders involving the immune mechanism: Secondary | ICD-10-CM | POA: Diagnosis not present

## 2014-04-05 DIAGNOSIS — Z872 Personal history of diseases of the skin and subcutaneous tissue: Secondary | ICD-10-CM | POA: Insufficient documentation

## 2014-04-05 DIAGNOSIS — K219 Gastro-esophageal reflux disease without esophagitis: Secondary | ICD-10-CM | POA: Diagnosis not present

## 2014-04-05 DIAGNOSIS — F419 Anxiety disorder, unspecified: Secondary | ICD-10-CM | POA: Insufficient documentation

## 2014-04-05 DIAGNOSIS — Z880 Allergy status to penicillin: Secondary | ICD-10-CM | POA: Insufficient documentation

## 2014-04-05 DIAGNOSIS — F329 Major depressive disorder, single episode, unspecified: Secondary | ICD-10-CM | POA: Insufficient documentation

## 2014-04-05 DIAGNOSIS — Z791 Long term (current) use of non-steroidal anti-inflammatories (NSAID): Secondary | ICD-10-CM | POA: Diagnosis not present

## 2014-04-05 DIAGNOSIS — R079 Chest pain, unspecified: Secondary | ICD-10-CM | POA: Insufficient documentation

## 2014-04-05 DIAGNOSIS — Z720 Tobacco use: Secondary | ICD-10-CM | POA: Insufficient documentation

## 2014-04-05 DIAGNOSIS — G43909 Migraine, unspecified, not intractable, without status migrainosus: Secondary | ICD-10-CM | POA: Diagnosis not present

## 2014-04-05 DIAGNOSIS — E780 Pure hypercholesterolemia: Secondary | ICD-10-CM | POA: Insufficient documentation

## 2014-04-05 DIAGNOSIS — Z79899 Other long term (current) drug therapy: Secondary | ICD-10-CM | POA: Insufficient documentation

## 2014-04-05 LAB — TROPONIN I: Troponin I: <0.03 ng/mL (ref <0.031)

## 2014-04-05 LAB — CBC WITH DIFFERENTIAL/PLATELET
Basophils Absolute: 0 10*3/uL (ref 0.0–0.1)
Basophils Relative: 0 % (ref 0–1)
Eosinophils Absolute: 0.2 10*3/uL (ref 0.0–0.7)
Eosinophils Relative: 2 % (ref 0–5)
HCT: 42.4 % (ref 36.0–46.0)
Hemoglobin: 14.4 g/dL (ref 12.0–15.0)
Lymphocytes Relative: 40 % (ref 12–46)
Lymphs Abs: 3.5 10*3/uL (ref 0.7–4.0)
MCH: 30.8 pg (ref 26.0–34.0)
MCHC: 34 g/dL (ref 30.0–36.0)
MCV: 90.8 fL (ref 78.0–100.0)
Monocytes Absolute: 0.5 10*3/uL (ref 0.1–1.0)
Monocytes Relative: 6 % (ref 3–12)
Neutro Abs: 4.5 10*3/uL (ref 1.7–7.7)
Neutrophils Relative %: 52 % (ref 43–77)
Platelets: 279 10*3/uL (ref 150–400)
RBC: 4.67 MIL/uL (ref 3.87–5.11)
RDW: 13.3 % (ref 11.5–15.5)
WBC: 8.7 10*3/uL (ref 4.0–10.5)

## 2014-04-05 LAB — COMPREHENSIVE METABOLIC PANEL
ALT: 18 U/L (ref 0–35)
AST: 23 U/L (ref 0–37)
Albumin: 4.3 g/dL (ref 3.5–5.2)
Alkaline Phosphatase: 59 U/L (ref 39–117)
Anion gap: 5 (ref 5–15)
BUN: 14 mg/dL (ref 6–23)
CO2: 28 mmol/L (ref 19–32)
Calcium: 9 mg/dL (ref 8.4–10.5)
Chloride: 108 mEq/L (ref 96–112)
Creatinine, Ser: 0.58 mg/dL (ref 0.50–1.10)
GFR calc Af Amer: >90 mL/min (ref >90)
GFR calc non Af Amer: >90 mL/min (ref >90)
Glucose, Bld: 90 mg/dL (ref 70–99)
Potassium: 3.9 mmol/L (ref 3.5–5.1)
Sodium: 141 mmol/L (ref 135–145)
Total Bilirubin: 0.3 mg/dL (ref 0.3–1.2)
Total Protein: 6.8 g/dL (ref 6.0–8.3)

## 2014-04-05 LAB — LIPASE, BLOOD: Lipase: 28 U/L (ref 11–59)

## 2014-04-05 MED ORDER — IBUPROFEN 800 MG PO TABS
ORAL_TABLET | ORAL | Status: AC
  Administered 2014-04-05: 800 mg via ORAL
  Filled 2014-04-05: qty 1

## 2014-04-05 MED ORDER — IBUPROFEN 800 MG PO TABS
800.0000 mg | ORAL_TABLET | Freq: Once | ORAL | Status: AC
  Administered 2014-04-05: 800 mg via ORAL

## 2014-04-05 MED ORDER — NITROGLYCERIN 0.4 MG SL SUBL
0.4000 mg | SUBLINGUAL_TABLET | Freq: Once | SUBLINGUAL | Status: AC
  Administered 2014-04-05: 0.4 mg via SUBLINGUAL
  Filled 2014-04-05: qty 1

## 2014-04-05 NOTE — ED Notes (Signed)
Intermittent midsternal and  L chest pain described as deep ache, sometimes burning.  Occasionally has some lightheadedness and burping.  Pain does not seem to be associated with anything and is not relieved by anything particular.  She has had a little SOB yesterday.  Hx of hyperlipidemia, smoking and father who had CABG.  She had recent visit to urgent care which resulted in a normal EKG and chest x-ray; however, they did no labs.

## 2014-04-05 NOTE — Discharge Instructions (Signed)
Follow-up with cardiology on Wednesday as scheduled, and return to the ER if your symptoms substantially worsen or change.   Chest Pain (Nonspecific) It is often hard to give a specific diagnosis for the cause of chest pain. There is always a chance that your pain could be related to something serious, such as a heart attack or a blood clot in the lungs. You need to follow up with your health care provider for further evaluation. CAUSES   Heartburn.  Pneumonia or bronchitis.  Anxiety or stress.  Inflammation around your heart (pericarditis) or lung (pleuritis or pleurisy).  A blood clot in the lung.  A collapsed lung (pneumothorax). It can develop suddenly on its own (spontaneous pneumothorax) or from trauma to the chest.  Shingles infection (herpes zoster virus). The chest wall is composed of bones, muscles, and cartilage. Any of these can be the source of the pain.  The bones can be bruised by injury.  The muscles or cartilage can be strained by coughing or overwork.  The cartilage can be affected by inflammation and become sore (costochondritis). DIAGNOSIS  Lab tests or other studies may be needed to find the cause of your pain. Your health care provider may have you take a test called an ambulatory electrocardiogram (ECG). An ECG records your heartbeat patterns over a 24-hour period. You may also have other tests, such as:  Transthoracic echocardiogram (TTE). During echocardiography, sound waves are used to evaluate how blood flows through your heart.  Transesophageal echocardiogram (TEE).  Cardiac monitoring. This allows your health care provider to monitor your heart rate and rhythm in real time.  Holter monitor. This is a portable device that records your heartbeat and can help diagnose heart arrhythmias. It allows your health care provider to track your heart activity for several days, if needed.  Stress tests by exercise or by giving medicine that makes the heart beat  faster. TREATMENT   Treatment depends on what may be causing your chest pain. Treatment may include:  Acid blockers for heartburn.  Anti-inflammatory medicine.  Pain medicine for inflammatory conditions.  Antibiotics if an infection is present.  You may be advised to change lifestyle habits. This includes stopping smoking and avoiding alcohol, caffeine, and chocolate.  You may be advised to keep your head raised (elevated) when sleeping. This reduces the chance of acid going backward from your stomach into your esophagus. Most of the time, nonspecific chest pain will improve within 2-3 days with rest and mild pain medicine.  HOME CARE INSTRUCTIONS   If antibiotics were prescribed, take them as directed. Finish them even if you start to feel better.  For the next few days, avoid physical activities that bring on chest pain. Continue physical activities as directed.  Do not use any tobacco products, including cigarettes, chewing tobacco, or electronic cigarettes.  Avoid drinking alcohol.  Only take medicine as directed by your health care provider.  Follow your health care provider's suggestions for further testing if your chest pain does not go away.  Keep any follow-up appointments you made. If you do not go to an appointment, you could develop lasting (chronic) problems with pain. If there is any problem keeping an appointment, call to reschedule. SEEK MEDICAL CARE IF:   Your chest pain does not go away, even after treatment.  You have a rash with blisters on your chest.  You have a fever. SEEK IMMEDIATE MEDICAL CARE IF:   You have increased chest pain or pain that spreads to your  arm, neck, jaw, back, or abdomen.  You have shortness of breath.  You have an increasing cough, or you cough up blood.  You have severe back or abdominal pain.  You feel nauseous or vomit.  You have severe weakness.  You faint.  You have chills. This is an emergency. Do not wait to  see if the pain will go away. Get medical help at once. Call your local emergency services (911 in [place] Do not drive yourself to the hospital. MAKE SURE YOU:   Understand these instructions.  Will watch your condition.  Will get help right away if you are not doing well or get worse. Document Released: 01/04/2005 Document Revised: 04/01/2013 Document Reviewed: 10/31/2007 [facility] Patient Information 2015 [place], [place]. This information is not intended to replace advice given to you by your health care provider. Make sure you discuss any questions you have with your health care provider.

## 2014-04-05 NOTE — ED Notes (Signed)
Pt c/o of chest pain intermittently last month, patient was seen at urgent care two weeks ago. Patient states episodes of chest pain are becoming more frequent and more severe.

## 2014-04-05 NOTE — ED Notes (Signed)
12 lead completed, given to Dr. Stark Jock

## 2014-04-05 NOTE — ED Provider Notes (Signed)
CSN: [id number]     Arrival date & time 04/05/14  1946 History  This chart was scribed for Veryl Speak, MD by Randa Evens, ED Scribe. This patient was seen in room APA10/APA10 and the patient's care was started at 10:14 PM.      Chief Complaint  Patient presents with  . Chest Pain   Patient is a 48 y.o. female presenting with chest pain. The history is provided by the patient. No language interpreter was used.  Chest Pain Pain quality: radiating   Pain radiates to:  L arm and neck Pain radiates to the back: no   Pain severity:  Mild Onset quality:  Gradual Duration:  5 weeks Timing:  Intermittent Progression:  Unchanged Chronicity:  Recurrent Context: at rest   Ineffective treatments:  Antacids Associated symptoms: dizziness and palpitations   Associated symptoms: no diaphoresis and no nausea   Risk factors: high cholesterol    HPI Comments: Jasmine Allen is a 48 y.o. female who presents to the Emergency Department complaining of intermittent CP onset 5 weeks prior. Pt states that the CP has recently worsened over the past 2 days. Pt states that today her symptoms began at 6:30 PM and has not improved. Pt states she has associated palpitations and dizziness. Pt rates the severity of her pain 4/10. Pt states that pain has been radiating into her left arm and neck. Pt states that pain begins at rest. Pt states that she doesn't live an active lifestyle. Pt states that sometimes she feels SOB with slight exertion. Pt states she took 1 Tums yesterday with no relief.  States she has a follow up with cardiologist 3 days from now. Pt states she has been seen at urgent care for similar symptoms and states that her EKG and CXR were normal. Pt states she has family HX of cardiac disease. Denies nausea or diaphoresis.   Past Medical History  Diagnosis Date  . Bulging disc   . Hypercholesteremia   . Migraine   . Opioid abuse, in remission   . Depressive disorder, not elsewhere classified    . Esophageal reflux   . Anxiety state, unspecified   . Diverticulosis of colon (without mention of hemorrhage)     CT Scan  . Esophagitis 1998  . Hiatal hernia 1998  . Alopecia, unspecified   . Anemia, unspecified    Past Surgical History  Procedure Laterality Date  . Knee surgery    . Tonsillectomy    . Ablation    . Cardiac catheterization     Family History  Problem Relation Age of Onset  . Colon cancer Neg Hx   . Stomach cancer Neg Hx    History  Substance Use Topics  . Smoking status: Current Every Day Smoker -- 1.00 packs/day    Last Attempt to Quit: 10/25/2009  . Smokeless tobacco: Never Used  . Alcohol Use: Yes     Comment: rare   OB History    No data available     Review of Systems  Constitutional: Negative for diaphoresis.  Cardiovascular: Positive for chest pain and palpitations.  Gastrointestinal: Negative for nausea.  Neurological: Positive for dizziness.      Allergies  Amoxicillin-pot clavulanate; Metoclopramide hcl; Erythromycin; and Sulfonamide derivatives  Home Medications   Prior to Admission medications   Medication Sig Start Date End Date Taking? Authorizing Provider  ALPRAZolam Duanne Moron) 0.25 MG tablet Take 0.25 mg by mouth at bedtime as needed for sleep.  10/21/13   Historical  Provider, MD  atorvastatin (LIPITOR) 10 MG tablet Take 10 mg by mouth every other day.     Historical Provider, MD  Biotin 5000 MCG CAPS Take 5,000 mcg by mouth daily.    Historical Provider, MD  butalbital-acetaminophen-caffeine (ESGIC PLUS) 50-500-40 MG per tablet Take 1 tablet by mouth every 4 (four) hours as needed for pain or headache.    Historical Provider, MD  cetirizine (ZYRTEC) 10 MG tablet Take 10 mg by mouth daily.    Historical Provider, MD  cloNIDine (CATAPRES) 0.2 MG tablet Take 0.2 mg by mouth daily.    Historical Provider, MD  esomeprazole (NEXIUM) 40 MG capsule Take 40 mg by mouth daily before breakfast.    Historical Provider, MD  estradiol  (ESTRACE) 2 MG tablet Take 2 mg by mouth at bedtime.    Historical Provider, MD  finasteride (PROSCAR) 5 MG tablet Take 2.5 mg by mouth daily.  11/20/13   Historical Provider, MD  furosemide (LASIX) 20 MG tablet Take 10-20 mg by mouth every other day.  10/27/13   Historical Provider, MD  HYDROcodone-acetaminophen (NORCO) 5-325 MG per tablet Take 2 tablets by mouth every 4 (four) hours as needed. 12/28/13   Mariea Clonts, MD  hydrocortisone valerate cream (WESTCORT) 0.2 % Apply 1 application topically daily as needed (eczema).  08/21/12   Historical Provider, MD  Multiple Vitamin (MULITIVITAMIN WITH MINERALS) TABS Take 1 tablet by mouth daily.    Historical Provider, MD  naproxen (NAPROSYN) 375 MG tablet Take 1 tablet (375 mg total) by mouth 2 (two) times daily. 12/28/13   Mariea Clonts, MD  progesterone (PROMETRIUM) 100 MG capsule Take 100 mg by mouth at bedtime.    Historical Provider, MD  traZODone (DESYREL) 100 MG tablet  10/17/12   Historical Provider, MD  valACYclovir (VALTREX) 1000 MG tablet Take 500 mg by mouth 2 (two) times daily as needed (fever blister).  10/28/12   Historical Provider, MD   Triage Vitals: BP 163/78 mmHg  Pulse 82  Temp(Src) 98.9 F (37.2 C) (Oral)  Resp 20  Ht 5' (1.524 m)  Wt 108 lb (48.988 kg)  BMI 21.09 kg/m2  SpO2 100%  Physical Exam  Constitutional: She is oriented to person, place, and time. She appears well-developed and well-nourished. No distress.  HENT:  Head: Normocephalic and atraumatic.  Eyes: Conjunctivae and EOM are normal.  Neck: Neck supple. No tracheal deviation present.  Cardiovascular: Normal rate.   Pulmonary/Chest: Effort normal. No respiratory distress.  Musculoskeletal: Normal range of motion.  Neurological: She is alert and oriented to person, place, and time.  Skin: Skin is warm and dry.  Psychiatric: She has a normal mood and affect. Her behavior is normal.  Nursing note and vitals reviewed.   ED Course  Procedures (including  critical care time) DIAGNOSTIC STUDIES: Oxygen Saturation is 100% on RA, normal by my interpretation.    COORDINATION OF CARE: 10:24 PM-Discussed treatment plan with pt at bedside and pt agreed to plan.     Labs Review Labs Reviewed  CBC WITH DIFFERENTIAL  COMPREHENSIVE METABOLIC PANEL  LIPASE, BLOOD  TROPONIN I    Imaging Review No results found.   EKG Interpretation   Date/Time:  Sunday April 05 2014 19:52:05 EST Ventricular Rate:  72 PR Interval:  134 QRS Duration: 68 QT Interval:  410 QTC Calculation: 448 R Axis:   48 Text Interpretation:  Normal sinus rhythm Possible Left atrial enlargement  Borderline ECG Confirmed by DELOS  MD, Ceceilia Cephus ([id number]) on  04/05/2014  8:30:54 PM      MDM   Final diagnoses:  None      Patient is a 48 year old female who presents with chest discomfort that has been occurring intermittently for several weeks. She was seen at urgent care several weeks ago which revealed no abnormality with her chest x-ray or EKG. They did make arrangements for her to follow-up with cardiology. She has an appointment with Dr. Frances Nickels in the near future. She experienced an additional episode of aching to the front of her chest this evening and presents for evaluation of this.  Her workup reveals an unchanged EKG and negative troponin. This is several hours into her discomfort. She was given nitroglycerin with no relief, only a headache resulted. She does have a family history and is a smoker, however I doubt a cardiac etiology and see no objective evidence of this. She has a follow-up appointment with cardiology 2 days from now and I believe she is appropriate to be discharged with the scheduled follow-up.   I personally performed the services described in this documentation, which was scribed in my presence. The recorded information has been reviewed and is accurate.       Veryl Speak, MD 04/05/14 [phone]

## 2014-04-07 ENCOUNTER — Ambulatory Visit (INDEPENDENT_AMBULATORY_CARE_PROVIDER_SITE_OTHER): Payer: 59 | Admitting: Cardiovascular Disease

## 2014-04-07 ENCOUNTER — Encounter: Payer: Self-pay | Admitting: *Deleted

## 2014-04-07 VITALS — BP 120/70 | HR 84 | Resp 11 | Ht 60.0 in | Wt 108.0 lb

## 2014-04-07 DIAGNOSIS — F17200 Nicotine dependence, unspecified, uncomplicated: Secondary | ICD-10-CM | POA: Insufficient documentation

## 2014-04-07 DIAGNOSIS — R072 Precordial pain: Secondary | ICD-10-CM

## 2014-04-07 DIAGNOSIS — Z720 Tobacco use: Secondary | ICD-10-CM

## 2014-04-07 DIAGNOSIS — IMO0001 Reserved for inherently not codable concepts without codable children: Secondary | ICD-10-CM | POA: Insufficient documentation

## 2014-04-07 DIAGNOSIS — R079 Chest pain, unspecified: Secondary | ICD-10-CM

## 2014-04-07 DIAGNOSIS — R071 Chest pain on breathing: Secondary | ICD-10-CM | POA: Insufficient documentation

## 2014-04-07 NOTE — Assessment & Plan Note (Signed)
Counseled for less than 10 mintues CXR this month at urgent care ok  Not motivated to quit at this time

## 2014-04-07 NOTE — Assessment & Plan Note (Signed)
Atypical normal ECG  F/U ETT

## 2014-04-07 NOTE — Progress Notes (Signed)
Patient ID: Jasmine Allen, female   DOB: [birth year], 48 y.o.   MRN: [id number]    Jasmine Allen is a 48 y.o. female seen in Emergency Department complaining of intermittent CP 12/28  onset 5 weeks prior. Pt states that the CP has recently worsened over the past 2 days. Pt states that today her symptoms began at 6:30 PM and has not improved. Pt states she has associated palpitations and dizziness. Pt rates the severity of her pain 4/10. Pt states that pain has been radiating into her left arm and neck. Pt states that pain begins at rest. Pt states that she doesn't live an active lifestyle. Pt states that sometimes she feels SOB with slight exertion. Pt states she took 1 Tums yesterday with no relief. States she has a follow up with cardiologist 3 days from now. Pt states she has been seen at urgent care for similar symptoms and states that her EKG and CXR were normal. Pt states she has family HX of cardiac disease. Denies nausea or diaphoresis.   I cathed her dad in past  Discussed her smoking again Husband travels abroad a lot and has two teenage children   ROS: Denies fever, malais, weight loss, blurry vision, decreased visual acuity, cough, sputum, SOB, hemoptysis, pleuritic pain, palpitaitons, heartburn, abdominal pain, melena, lower extremity edema, claudication, or rash.  All other systems reviewed and negative   General: Affect appropriate Healthy:  appears stated age 33: normal Neck supple with no adenopathy JVP normal no bruits no thyromegaly Lungs clear with no wheezing and good diaphragmatic motion Heart:  S1/S2 no murmur,rub, gallop or click PMI normal Abdomen: benighn, BS positve, no tenderness, no AAA no bruit.  No HSM or HJR Distal pulses intact with no bruits No edema Neuro non-focal Skin warm and dry No muscular weakness  Medications Current Outpatient Prescriptions  Medication Sig Dispense Refill  . ALPRAZolam (XANAX) 0.25 MG tablet Take 0.25 mg by mouth at  bedtime as needed for sleep.     Marland Kitchen aspirin EC 81 MG tablet Take 81 mg by mouth daily.    Marland Kitchen atorvastatin (LIPITOR) 10 MG tablet Take 10 mg by mouth every other day.     . Biotin 5000 MCG CAPS Take 5,000 mcg by mouth daily.    . butalbital-acetaminophen-caffeine (ESGIC PLUS) 50-500-40 MG per tablet Take 1 tablet by mouth every 4 (four) hours as needed for pain or headache.    . cetirizine (ZYRTEC) 10 MG tablet Take 10 mg by mouth every morning.     . cloNIDine (CATAPRES) 0.2 MG tablet Take 0.2 mg by mouth at bedtime.     Marland Kitchen esomeprazole (NEXIUM) 40 MG capsule Take 40 mg by mouth daily before breakfast.    . estradiol (ESTRACE) 2 MG tablet Take 2 mg by mouth at bedtime.    Marland Kitchen FERREX 150 150 MG capsule Take 150 mg by mouth daily.  0  . finasteride (PROSCAR) 5 MG tablet Take 2.5 mg by mouth daily.     . furosemide (LASIX) 20 MG tablet Take 10 mg by mouth every morning.     . hydrocortisone valerate cream (WESTCORT) 0.2 % Apply 1 application topically daily as needed (eczema).     . Multiple Vitamin (MULITIVITAMIN WITH MINERALS) TABS Take 1 tablet by mouth daily.    . naproxen (NAPROSYN) 375 MG tablet Take 1 tablet (375 mg total) by mouth 2 (two) times daily. (Patient not taking: Reported on 04/05/2014) 10 tablet 0  . progesterone (PROMETRIUM)  100 MG capsule Take 100 mg by mouth at bedtime.    . traZODone (DESYREL) 100 MG tablet Take 100 mg by mouth at bedtime.     . valACYclovir (VALTREX) 1000 MG tablet Take 500 mg by mouth 2 (two) times daily as needed (fever blister).      No current facility-administered medications for this visit.    Allergies Amoxicillin-pot clavulanate; Metoclopramide hcl; Erythromycin; and Sulfonamide derivatives  Family History: Family History  Problem Relation Age of Onset  . Colon cancer Neg Hx   . Stomach cancer Neg Hx   . Heart disease Father     CABG  . Breast cancer Mother     Social History: History   Social History  . Marital Status: Married    Spouse  Name: N/A    Number of Children: N/A  . Years of Education: N/A   Occupational History  . Not on file.   Social History Main Topics  . Smoking status: Current Every Day Smoker -- 1.00 packs/day    Last Attempt to Quit: 10/25/2009  . Smokeless tobacco: Never Used  . Alcohol Use: Yes     Comment: rare  . Drug Use: No  . Sexual Activity: Yes    Birth Control/ Protection: None, Post-menopausal   Other Topics Concern  . Not on file   Social History Narrative    Past Surgical History  Procedure Laterality Date  . Knee surgery    . Tonsillectomy    . Ablation    . Cardiac catheterization      Past Medical History  Diagnosis Date  . Bulging disc   . Hypercholesteremia   . Migraine   . Opioid abuse, in remission   . Depressive disorder, not elsewhere classified   . Esophageal reflux   . Anxiety state, unspecified   . Diverticulosis of colon (without mention of hemorrhage)     CT Scan  . Esophagitis 1998  . Hiatal hernia 1998  . Alopecia, unspecified   . Anemia, unspecified     Electrocardiogram:  SR rate 72 normal 12/28    Assessment and Plan

## 2014-04-07 NOTE — Patient Instructions (Signed)
Your physician recommends that you schedule a follow-up appointment in: AS NEEDED  Your physician recommends that you continue on your current medications as directed. Please refer to the Current Medication list given to you today. Your physician discussed the importance of regular exercise and recommended that you start or continue a regular exercise program for good health. [place]

## 2014-04-08 ENCOUNTER — Ambulatory Visit: Payer: 59 | Admitting: Cardiovascular Disease

## 2014-04-17 ENCOUNTER — Inpatient Hospital Stay (HOSPITAL_COMMUNITY): Admission: RE | Admit: 2014-04-17 | Payer: 59 | Source: Ambulatory Visit

## 2014-05-05 ENCOUNTER — Inpatient Hospital Stay (HOSPITAL_COMMUNITY): Admission: RE | Admit: 2014-05-05 | Payer: Self-pay | Source: Ambulatory Visit

## 2014-10-01 ENCOUNTER — Other Ambulatory Visit: Payer: Self-pay | Admitting: Orthopedic Surgery

## 2014-10-01 DIAGNOSIS — M25562 Pain in left knee: Secondary | ICD-10-CM

## 2014-10-05 ENCOUNTER — Ambulatory Visit
Admission: RE | Admit: 2014-10-05 | Discharge: 2014-10-05 | Disposition: A | Discharge status: Home / Self Care | Payer: 59 | Source: Ambulatory Visit | Attending: Orthopedic Surgery | Admitting: Orthopedic Surgery

## 2014-10-05 DIAGNOSIS — M25562 Pain in left knee: Secondary | ICD-10-CM

## 2014-11-05 ENCOUNTER — Other Ambulatory Visit: Payer: Self-pay | Admitting: Obstetrics and Gynecology

## 2014-11-06 LAB — CYTOLOGY - PAP

## 2015-10-22 ENCOUNTER — Emergency Department (HOSPITAL_COMMUNITY)
Admission: EM | Admit: 2015-10-22 | Discharge: 2015-10-22 | Disposition: A | Discharge status: Home / Self Care | Payer: 59 | Attending: Emergency Medicine | Admitting: Emergency Medicine

## 2015-10-22 ENCOUNTER — Encounter (HOSPITAL_COMMUNITY): Payer: Self-pay | Admitting: Emergency Medicine

## 2015-10-22 ENCOUNTER — Emergency Department (HOSPITAL_COMMUNITY): Payer: 59

## 2015-10-22 DIAGNOSIS — F329 Major depressive disorder, single episode, unspecified: Secondary | ICD-10-CM | POA: Insufficient documentation

## 2015-10-22 DIAGNOSIS — F17200 Nicotine dependence, unspecified, uncomplicated: Secondary | ICD-10-CM | POA: Insufficient documentation

## 2015-10-22 DIAGNOSIS — Z79899 Other long term (current) drug therapy: Secondary | ICD-10-CM | POA: Diagnosis not present

## 2015-10-22 DIAGNOSIS — R1013 Epigastric pain: Secondary | ICD-10-CM | POA: Diagnosis not present

## 2015-10-22 LAB — CBC
HCT: 45.4 % (ref 36.0–46.0)
Hemoglobin: 16 g/dL — ABNORMAL HIGH (ref 12.0–15.0)
MCH: 31.9 pg (ref 26.0–34.0)
MCHC: 35.2 g/dL (ref 30.0–36.0)
MCV: 90.4 fL (ref 78.0–100.0)
Platelets: 248 10*3/uL (ref 150–400)
RBC: 5.02 MIL/uL (ref 3.87–5.11)
RDW: 13.3 % (ref 11.5–15.5)
WBC: 7.9 10*3/uL (ref 4.0–10.5)

## 2015-10-22 LAB — URINALYSIS, ROUTINE W REFLEX MICROSCOPIC
Bilirubin Urine: NEGATIVE
Glucose, UA: NEGATIVE mg/dL
Hgb urine dipstick: NEGATIVE
Ketones, ur: NEGATIVE mg/dL
Leukocytes, UA: NEGATIVE
Nitrite: NEGATIVE
Protein, ur: NEGATIVE mg/dL
Specific Gravity, Urine: 1.015 (ref 1.005–1.030)
pH: 6.5 (ref 5.0–8.0)

## 2015-10-22 LAB — COMPREHENSIVE METABOLIC PANEL
ALT: 15 U/L (ref 14–54)
AST: 19 U/L (ref 15–41)
Albumin: 4.4 g/dL (ref 3.5–5.0)
Alkaline Phosphatase: 47 U/L (ref 38–126)
Anion gap: 3 — ABNORMAL LOW (ref 5–15)
BUN: 11 mg/dL (ref 6–20)
CO2: 29 mmol/L (ref 22–32)
Calcium: 9.3 mg/dL (ref 8.9–10.3)
Chloride: 105 mmol/L (ref 101–111)
Creatinine, Ser: 0.77 mg/dL (ref 0.44–1.00)
GFR calc Af Amer: >60 mL/min (ref >60)
GFR calc non Af Amer: >60 mL/min (ref >60)
Glucose, Bld: 98 mg/dL (ref 65–99)
Potassium: 4.1 mmol/L (ref 3.5–5.1)
Sodium: 137 mmol/L (ref 135–145)
Total Bilirubin: 0.7 mg/dL (ref 0.3–1.2)
Total Protein: 7.2 g/dL (ref 6.5–8.1)

## 2015-10-22 LAB — LIPASE, BLOOD: Lipase: 27 U/L (ref 11–51)

## 2015-10-22 MED ORDER — MORPHINE SULFATE (PF) 4 MG/ML IV SOLN
4.0000 mg | Freq: Once | INTRAVENOUS | Status: AC
  Administered 2015-10-22: 4 mg via INTRAVENOUS
  Filled 2015-10-22: qty 1

## 2015-10-22 MED ORDER — ONDANSETRON HCL 4 MG/2ML IJ SOLN
4.0000 mg | Freq: Once | INTRAMUSCULAR | Status: AC
  Administered 2015-10-22: 4 mg via INTRAVENOUS
  Filled 2015-10-22: qty 2

## 2015-10-22 MED ORDER — SODIUM CHLORIDE 0.9 % IV BOLUS (SEPSIS)
1000.0000 mL | Freq: Once | INTRAVENOUS | Status: AC
  Administered 2015-10-22: 1000 mL via INTRAVENOUS

## 2015-10-22 MED ORDER — OXYCODONE-ACETAMINOPHEN 5-325 MG PO TABS: 1.0000 | ORAL_TABLET | ORAL | Status: DC | PRN

## 2015-10-22 MED ORDER — ONDANSETRON HCL 8 MG PO TABS: 8.0000 mg | ORAL_TABLET | ORAL | Status: DC | PRN

## 2015-10-22 MED ORDER — MORPHINE SULFATE (PF) 4 MG/ML IV SOLN
INTRAVENOUS | Status: AC
  Filled 2015-10-22: qty 1

## 2015-10-22 MED ORDER — ONDANSETRON HCL 4 MG/2ML IJ SOLN
INTRAMUSCULAR | Status: AC
  Filled 2015-10-22: qty 2

## 2015-10-22 NOTE — ED Provider Notes (Signed)
CSN: [id number]     Arrival date & time 10/22/15  1553 History   First MD Initiated Contact with Patient 10/22/15 1645     Chief Complaint  Patient presents with  . Abdominal Pain     (Consider location/radiation/quality/duration/timing/severity/associated sxs/prior Treatment) HPI...Marland KitchenMarland KitchenEpigastric pain radiating to the left upper quadrant since early this morning, worse with eating. Patient tried Gas-X and Tums with minimal relief.  She is nauseated, but no vomiting or diarrhea. No fever, sweats, chills, dysuria. This is not a typical pain. Severity is moderate.  Past Medical History  Diagnosis Date  . Bulging disc   . Hypercholesteremia   . Migraine   . Opioid abuse, in remission   . Depressive disorder, not elsewhere classified   . Esophageal reflux   . Anxiety state, unspecified   . Diverticulosis of colon (without mention of hemorrhage)     CT Scan  . Esophagitis 1998  . Hiatal hernia 1998  . Alopecia, unspecified   . Anemia, unspecified    Past Surgical History  Procedure Laterality Date  . Knee surgery    . Tonsillectomy    . Ablation    . Cardiac catheterization     Family History  Problem Relation Age of Onset  . Colon cancer Neg Hx   . Stomach cancer Neg Hx   . Heart disease Father     CABG  . Breast cancer Mother    Social History  Substance Use Topics  . Smoking status: Current Every Day Smoker -- 1.00 packs/day    Last Attempt to Quit: 10/25/2009  . Smokeless tobacco: Never Used  . Alcohol Use: Yes     Comment: "once a week"   OB History    No data available     Review of Systems  All other systems reviewed and are negative.     Allergies  Amoxicillin-pot clavulanate; Metoclopramide hcl; Erythromycin; and Sulfonamide derivatives  Home Medications   Prior to Admission medications   Medication Sig Start Date End Date Taking? Authorizing Provider  ALPRAZolam (XANAX) 0.25 MG tablet Take 0.25-0.5 mg by mouth at bedtime as needed for sleep.   10/21/13  Yes Historical Provider, MD  Biotin 5000 MCG CAPS Take 10,000 mcg by mouth daily.    Yes Historical Provider, MD  butalbital-acetaminophen-caffeine (ESGIC PLUS) 50-500-40 MG per tablet Take 1 tablet by mouth every 4 (four) hours as needed for pain or headache.   Yes Historical Provider, MD  cetirizine (ZYRTEC) 10 MG tablet Take 10 mg by mouth every morning.    Yes Historical Provider, MD  cloNIDine (CATAPRES) 0.2 MG tablet Take 0.2 mg by mouth at bedtime.    Yes Historical Provider, MD  esomeprazole (NEXIUM) 40 MG capsule Take 40 mg by mouth daily before breakfast.   Yes Historical Provider, MD  estradiol (ESTRACE) 2 MG tablet Take 2 mg by mouth at bedtime.   Yes Historical Provider, MD  finasteride (PROSCAR) 5 MG tablet Take 2.5 mg by mouth daily.  11/20/13  Yes Historical Provider, MD  hydrocortisone valerate cream (WESTCORT) 0.2 % Apply 1 application topically daily as needed (eczema).  08/21/12  Yes Historical Provider, MD  hydrOXYzine (ATARAX/VISTARIL) 10 MG tablet Take 1 tablet by mouth daily as needed for itching.  10/07/15  Yes Historical Provider, MD  Melatonin 10 MG CAPS Take 1 capsule by mouth at bedtime.   Yes Historical Provider, MD  Multiple Vitamin (MULITIVITAMIN WITH MINERALS) TABS Take 1 tablet by mouth daily.   Yes Historical Provider, MD  progesterone (PROMETRIUM) 100 MG capsule Take 100 mg by mouth at bedtime.   Yes Historical Provider, MD  spironolactone (ALDACTONE) 25 MG tablet Take 25 mg by mouth daily.   Yes Historical Provider, MD  traZODone (DESYREL) 100 MG tablet Take 100 mg by mouth at bedtime as needed for sleep.  10/17/12  Yes Historical Provider, MD  valACYclovir (VALTREX) 1000 MG tablet Take 500 mg by mouth 2 (two) times daily as needed (fever blister).  10/28/12  Yes Historical Provider, MD  ondansetron (ZOFRAN) 8 MG tablet Take 1 tablet (8 mg total) by mouth every 4 (four) hours as needed. 10/22/15   Nat Christen, MD  oxyCODONE-acetaminophen (PERCOCET/ROXICET)  5-325 MG tablet Take 1-2 tablets by mouth every 4 (four) hours as needed for severe pain. 10/22/15   Nat Christen, MD   BP 113/65 mmHg  Pulse 44  Temp(Src) 98.5 F (36.9 C) (Oral)  Resp 15  Ht 5' (1.524 m)  Wt 107 lb (48.535 kg)  BMI 20.90 kg/m2  SpO2 98% Physical Exam  Constitutional: She is oriented to person, place, and time. She appears well-developed and well-nourished.  HENT:  Head: Normocephalic and atraumatic.  Eyes: Conjunctivae are normal.  Neck: Neck supple.  Cardiovascular: Normal rate and regular rhythm.   Pulmonary/Chest: Effort normal and breath sounds normal.  Abdominal: Soft. Bowel sounds are normal.  Minimal epigastric and left upper quadrant tenderness  Musculoskeletal: Normal range of motion.  Neurological: She is alert and oriented to person, place, and time.  Skin: Skin is warm and dry.  Psychiatric: She has a normal mood and affect. Her behavior is normal.  Nursing note and vitals reviewed.   ED Course  Procedures (including critical care time) Labs Review Labs Reviewed  COMPREHENSIVE METABOLIC PANEL - Abnormal; Notable for the following:    Anion gap 3 (*)    All other components within normal limits  CBC - Abnormal; Notable for the following:    Hemoglobin 16.0 (*)    All other components within normal limits  LIPASE, BLOOD  URINALYSIS, ROUTINE W REFLEX MICROSCOPIC (NOT AT [facility])    Imaging Review US Abdomen Limited  10/22/2015  CLINICAL DATA:  Acute right upper quadrant abdominal pain. EXAM: US ABDOMEN LIMITED - RIGHT UPPER QUADRANT COMPARISON:  None. FINDINGS: Gallbladder: No gallstones or wall thickening visualized. No sonographic Murphy sign noted by sonographer. Common bile duct: Diameter: 3 mm which is within normal limits. Liver: No focal lesion identified. Within normal limits in parenchymal echogenicity. IMPRESSION: No significant abnormality seen in the right upper quadrant of the abdomen. Electronically Signed   By: Marijo Conception, M.D.    On: 10/22/2015 18:04   I have personally reviewed and evaluated these images and lab results as part of my medical decision-making.   EKG Interpretation   Date/Time:  Friday October 22 2015 16:02:11 EDT Ventricular Rate:  58 PR Interval:  134 QRS Duration: 70 QT Interval:  448 QTC Calculation: 439 R Axis:   80 Text Interpretation:  Sinus bradycardia Biatrial enlargement Abnormal ECG  No significant change was found Confirmed by CAMPOS  MD, KEVIN ([id number]) on  10/22/2015 4:06:59 PM      MDM   Final diagnoses:  Epigastric pain    No acute abdomen. Ultrasound shows no significant abnormalities in the right upper quadrant. White count, liver functions, lipase all normal. Discharge medications Percocet and Zofran 8 mg    Nat Christen, MD 10/22/15 1955

## 2015-10-22 NOTE — Discharge Instructions (Signed)
Ultrasound showed no acute findings. Blood work was good. Medication for pain and nausea. Return if worse or follow-up your primary care doctor.

## 2015-10-22 NOTE — ED Notes (Signed)
Patient complaining of upper abdominal pain starting this morning. States pain is worse after eating.

## 2015-10-28 ENCOUNTER — Telehealth: Payer: Self-pay | Admitting: Gastroenterology

## 2015-10-28 NOTE — Telephone Encounter (Signed)
Patient states she continues to have the epigastric pain over right rib cage and GERD. She would like to see a GI for this. Scheduled with Earnie Larsson on 11/02/15 at 1:30 PM.

## 2015-10-29 ENCOUNTER — Emergency Department (HOSPITAL_COMMUNITY): Payer: 59

## 2015-10-29 ENCOUNTER — Emergency Department (HOSPITAL_COMMUNITY)
Admission: EM | Admit: 2015-10-29 | Discharge: 2015-10-30 | Disposition: A | Discharge status: Home / Self Care | Payer: 59 | Attending: Emergency Medicine | Admitting: Emergency Medicine

## 2015-10-29 ENCOUNTER — Encounter (HOSPITAL_COMMUNITY): Payer: Self-pay | Admitting: Emergency Medicine

## 2015-10-29 DIAGNOSIS — K2970 Gastritis, unspecified, without bleeding: Secondary | ICD-10-CM | POA: Insufficient documentation

## 2015-10-29 DIAGNOSIS — F17200 Nicotine dependence, unspecified, uncomplicated: Secondary | ICD-10-CM | POA: Insufficient documentation

## 2015-10-29 DIAGNOSIS — R1013 Epigastric pain: Secondary | ICD-10-CM | POA: Diagnosis present

## 2015-10-29 LAB — URINALYSIS, ROUTINE W REFLEX MICROSCOPIC
Bilirubin Urine: NEGATIVE
Glucose, UA: NEGATIVE mg/dL
Hgb urine dipstick: NEGATIVE
Ketones, ur: NEGATIVE mg/dL
Leukocytes, UA: NEGATIVE
Nitrite: NEGATIVE
Protein, ur: NEGATIVE mg/dL
Specific Gravity, Urine: 1.02 (ref 1.005–1.030)
pH: 7 (ref 5.0–8.0)

## 2015-10-29 LAB — COMPREHENSIVE METABOLIC PANEL
ALT: 16 U/L (ref 14–54)
AST: 26 U/L (ref 15–41)
Albumin: 4.1 g/dL (ref 3.5–5.0)
Alkaline Phosphatase: 53 U/L (ref 38–126)
Anion gap: 4 — ABNORMAL LOW (ref 5–15)
BUN: 11 mg/dL (ref 6–20)
CO2: 28 mmol/L (ref 22–32)
Calcium: 9.5 mg/dL (ref 8.9–10.3)
Chloride: 107 mmol/L (ref 101–111)
Creatinine, Ser: 0.79 mg/dL (ref 0.44–1.00)
GFR calc Af Amer: >60 mL/min (ref >60)
GFR calc non Af Amer: >60 mL/min (ref >60)
Glucose, Bld: 95 mg/dL (ref 65–99)
Potassium: 4.6 mmol/L (ref 3.5–5.1)
Sodium: 139 mmol/L (ref 135–145)
Total Bilirubin: 0.2 mg/dL — ABNORMAL LOW (ref 0.3–1.2)
Total Protein: 6.6 g/dL (ref 6.5–8.1)

## 2015-10-29 LAB — LIPASE, BLOOD: Lipase: 28 U/L (ref 11–51)

## 2015-10-29 LAB — CBC
HCT: 43.7 % (ref 36.0–46.0)
Hemoglobin: 14.6 g/dL (ref 12.0–15.0)
MCH: 30.7 pg (ref 26.0–34.0)
MCHC: 33.4 g/dL (ref 30.0–36.0)
MCV: 91.8 fL (ref 78.0–100.0)
Platelets: 250 10*3/uL (ref 150–400)
RBC: 4.76 MIL/uL (ref 3.87–5.11)
RDW: 13.2 % (ref 11.5–15.5)
WBC: 8.7 10*3/uL (ref 4.0–10.5)

## 2015-10-29 MED ORDER — ONDANSETRON HCL 4 MG/2ML IJ SOLN
4.0000 mg | Freq: Once | INTRAMUSCULAR | Status: AC
  Administered 2015-10-29: 4 mg via INTRAVENOUS
  Filled 2015-10-29: qty 2

## 2015-10-29 MED ORDER — MORPHINE SULFATE (PF) 4 MG/ML IV SOLN
4.0000 mg | Freq: Once | INTRAVENOUS | Status: AC
  Administered 2015-10-29: 4 mg via INTRAVENOUS
  Filled 2015-10-29: qty 1

## 2015-10-29 MED ORDER — OXYCODONE-ACETAMINOPHEN 5-325 MG PO TABS
1.0000 | ORAL_TABLET | Freq: Once | ORAL | Status: AC
  Administered 2015-10-29: 1 via ORAL
  Filled 2015-10-29: qty 1

## 2015-10-29 MED ORDER — GI COCKTAIL ~~LOC~~
30.0000 mL | Freq: Once | ORAL | Status: AC
  Administered 2015-10-29: 30 mL via ORAL
  Filled 2015-10-29: qty 30

## 2015-10-29 MED ORDER — IOPAMIDOL (ISOVUE-300) INJECTION 61%
INTRAVENOUS | Status: AC
  Administered 2015-10-30: 100 mL
  Filled 2015-10-29: qty 100

## 2015-10-29 MED ORDER — DIATRIZOATE MEGLUMINE & SODIUM 66-10 % PO SOLN
ORAL | Status: AC
  Filled 2015-10-29: qty 30

## 2015-10-29 NOTE — ED Notes (Signed)
Pt state "i woke up last Friday with a severe pain in my stomach" pt pointing to LUQ. Pt as appt with GI on Tuesday but cant wait due to the pain. Pt states she had a full workup in the ER already on Friday, was told to follow up with GI. Pt is very tender to palpation, recoiled with light touch. Pt ambualtory, AAOX4, in nad, resp e/u. Pt given prescription for percocet which helps the pain for a few hours.

## 2015-10-29 NOTE — ED Notes (Signed)
Patient transported to CT 

## 2015-10-29 NOTE — ED Notes (Signed)
MD at bedside. 

## 2015-10-29 NOTE — ED Provider Notes (Signed)
CSN: [id number]     Arrival date & time 10/29/15  1757 History   First MD Initiated Contact with Patient 10/29/15 2031     Chief Complaint  Patient presents with  . Abdominal Pain     (Consider location/radiation/quality/duration/timing/severity/associated sxs/prior Treatment) Patient is a 50 y.o. female presenting with abdominal pain. The history is provided by the patient.  Abdominal Pain Pain location:  Epigastric Pain quality: aching and sharp   Pain radiates to:  Back Pain severity:  Severe Onset quality:  Gradual Duration:  2 weeks Timing:  Constant Chronicity:  New Relieved by: percocet. Ineffective treatments:  Antacids Associated symptoms: anorexia, chills and nausea   Associated symptoms: no belching, no chest pain, no constipation, no cough, no diarrhea, no dysuria, no fatigue, no fever, no flatus, no hematemesis, no hematochezia, no hematuria, no melena, no shortness of breath, no sore throat, no vaginal bleeding, no vaginal discharge and no vomiting     Past Medical History  Diagnosis Date  . Bulging disc   . Hypercholesteremia   . Migraine   . Opioid abuse, in remission   . Depressive disorder, not elsewhere classified   . Esophageal reflux   . Anxiety state, unspecified   . Diverticulosis of colon (without mention of hemorrhage)     CT Scan  . Esophagitis 1998  . Hiatal hernia 1998  . Alopecia, unspecified   . Anemia, unspecified    Past Surgical History  Procedure Laterality Date  . Knee surgery    . Tonsillectomy    . Ablation    . Cardiac catheterization     Family History  Problem Relation Age of Onset  . Colon cancer Neg Hx   . Stomach cancer Neg Hx   . Heart disease Father     CABG  . Breast cancer Mother    Social History  Substance Use Topics  . Smoking status: Current Every Day Smoker -- 1.00 packs/day    Last Attempt to Quit: 10/25/2009  . Smokeless tobacco: Never Used  . Alcohol Use: Yes     Comment: "once a week"   OB History     No data available     Review of Systems  Constitutional: Positive for chills. Negative for fever and fatigue.  HENT: Negative for congestion and sore throat.   Eyes: Negative for visual disturbance.  Respiratory: Negative for cough and shortness of breath.   Cardiovascular: Negative for chest pain.  Gastrointestinal: Positive for nausea, abdominal pain and anorexia. Negative for vomiting, diarrhea, constipation, melena, hematochezia, flatus and hematemesis.  Genitourinary: Negative for dysuria, hematuria, vaginal bleeding and vaginal discharge.  Skin: Negative for rash.  Neurological: Negative for light-headedness and headaches.  Psychiatric/Behavioral: Negative for confusion and agitation.      Allergies  Amoxicillin-pot clavulanate; Metoclopramide hcl; Erythromycin; and Sulfonamide derivatives  Home Medications   Prior to Admission medications   Medication Sig Start Date End Date Taking? Authorizing Provider  ALPRAZolam (XANAX) 0.25 MG tablet Take 0.25-0.5 mg by mouth at bedtime as needed for sleep.  10/21/13  Yes Historical Provider, MD  Biotin 5000 MCG CAPS Take 10,000 mcg by mouth daily.    Yes Historical Provider, MD  butalbital-acetaminophen-caffeine (ESGIC PLUS) 50-500-40 MG per tablet Take 1 tablet by mouth every 4 (four) hours as needed for pain or headache.   Yes Historical Provider, MD  cetirizine (ZYRTEC) 10 MG tablet Take 10 mg by mouth every morning.    Yes Historical Provider, MD  cloNIDine (CATAPRES) 0.2 MG tablet  Take 0.2 mg by mouth at bedtime.    Yes Historical Provider, MD  estradiol (ESTRACE) 2 MG tablet Take 2 mg by mouth at bedtime.   Yes Historical Provider, MD  finasteride (PROSCAR) 5 MG tablet Take 2.5 mg by mouth daily.  11/20/13  Yes Historical Provider, MD  hydrocortisone valerate cream (WESTCORT) 0.2 % Apply 1 application topically daily as needed (eczema).  08/21/12  Yes Historical Provider, MD  hydrOXYzine (ATARAX/VISTARIL) 10 MG tablet Take 1 tablet  by mouth daily as needed for itching.  10/07/15  Yes Historical Provider, MD  Melatonin 10 MG CAPS Take 1 capsule by mouth at bedtime.   Yes Historical Provider, MD  Multiple Vitamin (MULITIVITAMIN WITH MINERALS) TABS Take 1 tablet by mouth daily.   Yes Historical Provider, MD  ondansetron (ZOFRAN) 8 MG tablet Take 1 tablet (8 mg total) by mouth every 4 (four) hours as needed. 10/22/15  Yes Nat Christen, MD  oxyCODONE-acetaminophen (PERCOCET/ROXICET) 5-325 MG tablet Take 1-2 tablets by mouth every 4 (four) hours as needed for severe pain. 10/22/15  Yes Nat Christen, MD  progesterone (PROMETRIUM) 100 MG capsule Take 100 mg by mouth at bedtime.   Yes Historical Provider, MD  spironolactone (ALDACTONE) 25 MG tablet Take 25 mg by mouth daily.   Yes Historical Provider, MD  traZODone (DESYREL) 100 MG tablet Take 100 mg by mouth at bedtime as needed for sleep.  10/17/12  Yes Historical Provider, MD  valACYclovir (VALTREX) 1000 MG tablet Take 500 mg by mouth 2 (two) times daily as needed (fever blister).  10/28/12  Yes Historical Provider, MD  esomeprazole (NEXIUM) 40 MG capsule Take 1 capsule (40 mg total) by mouth 2 (two) times daily before a meal. 10/30/15   Allie Bossier, MD  oxyCODONE-acetaminophen (PERCOCET/ROXICET) 5-325 MG tablet Take 1 tablet by mouth once. 10/30/15   Allie Bossier, MD  ranitidine (ZANTAC) 150 MG capsule Take 1 capsule (150 mg total) by mouth every evening. 10/30/15   Allie Bossier, MD   BP 109/70 mmHg  Pulse 50  Temp(Src) 98.4 F (36.9 C) (Oral)  Resp 14  Ht 5' (1.524 m)  Wt 49.624 kg  BMI 21.37 kg/m2  SpO2 97% Physical Exam  Constitutional: She is oriented to person, place, and time. She appears well-developed and well-nourished. No distress.  HENT:  Head: Normocephalic and atraumatic.  Eyes: Conjunctivae are normal.  Cardiovascular: Normal rate and normal heart sounds.   No murmur heard. Pulmonary/Chest: Effort normal and breath sounds normal. She has no wheezes. She has no  rales.  Abdominal: Soft. Bowel sounds are normal. She exhibits no distension and no mass. There is tenderness (mild epigastric). There is no rebound and no guarding.  Musculoskeletal: She exhibits no edema.  Neurological: She is alert and oriented to person, place, and time.  Skin: Skin is warm. She is not diaphoretic.  Psychiatric: She has a normal mood and affect. Her behavior is normal.  Nursing note and vitals reviewed.   ED Course  Procedures (including critical care time) Labs Review Labs Reviewed  COMPREHENSIVE METABOLIC PANEL - Abnormal; Notable for the following:    Total Bilirubin 0.2 (*)    Anion gap 4 (*)    All other components within normal limits  LIPASE, BLOOD  CBC  URINALYSIS, ROUTINE W REFLEX MICROSCOPIC (NOT AT [facility])  H. PYLORI ANTIBODY, IGG    Imaging Review Ct Abdomen Pelvis W Contrast  10/30/2015  CLINICAL DATA:  Epigastric pain. EXAM: CT ABDOMEN AND PELVIS WITH CONTRAST TECHNIQUE: Multidetector  CT imaging of the abdomen and pelvis was performed using the standard protocol following bolus administration of intravenous contrast. CONTRAST:  172mL ISOVUE-300 IOPAMIDOL (ISOVUE-300) INJECTION 61% COMPARISON:  Right upper quadrant ultrasound 10/22/2015. CT abdomen and pelvis 10/03/2012. FINDINGS: Dependent subsegmental atelectasis is present in the lung bases. The liver, gallbladder, spleen, adrenal glands, right kidney, and pancreas are unremarkable. There is no biliary dilatation. A 2 mm nonobstructing stone is present in the left kidney. The stomach is moderately distended by oral contrast and gas. Oral contrast is also present in multiple nondilated loops of small bowel. There is no evidence of bowel obstruction. The appendix is unremarkable. The bladder, uterus, and ovaries are unremarkable. Mild abdominal aortic atherosclerosis is again seen. No free fluid or enlarged lymph nodes are identified. Mild lumbar levoscoliosis is noted. IMPRESSION: 1. No acute abnormality  identified in the abdomen or pelvis. 2. Punctate nonobstructing left renal calculus. Electronically Signed   By: Logan Bores M.D.   On: 10/30/2015 00:44   I have personally reviewed and evaluated these images and lab results as part of my medical decision-making.   EKG Interpretation None      MDM   Final diagnoses:  Epigastric abdominal pain  Gastritis   50 year old female with history of acid reflux present in today with 1.5 weeks of waxing and waning abdominal pain. Sometimes it is severe and worsened by eating. She has family history of early aneurysmal disease. She is a smoker. CT was obtained but did not show any evidence of acute abdominal pathology. He did show 2 mm left renal stone that was nonobstructive. She is given GI cocktail with partial resolution of symptoms. Symptoms improved further with Percocet. She is given a prescription for Nexium twice a day with ranitidine daily at bedtime and Percocet only to be used if pain persists despite use of Tums and/or Carafate. H. pylori was drawn and still in process when the patient was discharged home. She was given instructions to have her GI specialist without the test result when she is seen on Tuesday. She was discharged home in good condition.    Allie Bossier, MD 10/30/15 [id number]  Pattricia Boss, MD 10/30/15 [phone]

## 2015-10-30 ENCOUNTER — Encounter (HOSPITAL_COMMUNITY): Payer: Self-pay | Admitting: Radiology

## 2015-10-30 ENCOUNTER — Emergency Department (HOSPITAL_COMMUNITY): Payer: 59

## 2015-10-30 MED ORDER — OXYCODONE-ACETAMINOPHEN 5-325 MG PO TABS: 1.0000 | ORAL_TABLET | Freq: Once | ORAL | Status: DC

## 2015-10-30 MED ORDER — RANITIDINE HCL 150 MG PO CAPS: 150.0000 mg | ORAL_CAPSULE | Freq: Every evening | ORAL | Status: DC

## 2015-10-30 MED ORDER — ESOMEPRAZOLE MAGNESIUM 40 MG PO CPDR: 40.0000 mg | DELAYED_RELEASE_CAPSULE | Freq: Two times a day (BID) | ORAL | Status: DC

## 2015-10-30 NOTE — ED Notes (Signed)
Pt returned from CT °

## 2015-11-01 LAB — H. PYLORI ANTIBODY, IGG: H Pylori IgG: <0.9 U/mL (ref 0.0–0.8)

## 2015-11-02 ENCOUNTER — Ambulatory Visit (INDEPENDENT_AMBULATORY_CARE_PROVIDER_SITE_OTHER): Payer: 59 | Admitting: Physician Assistant

## 2015-11-02 ENCOUNTER — Encounter: Payer: Self-pay | Admitting: Physician Assistant

## 2015-11-02 VITALS — BP 110/62 | HR 76 | Ht 60.0 in | Wt 109.8 lb

## 2015-11-02 DIAGNOSIS — R110 Nausea: Secondary | ICD-10-CM

## 2015-11-02 DIAGNOSIS — K219 Gastro-esophageal reflux disease without esophagitis: Secondary | ICD-10-CM

## 2015-11-02 DIAGNOSIS — R1013 Epigastric pain: Secondary | ICD-10-CM | POA: Diagnosis not present

## 2015-11-02 MED ORDER — SUCRALFATE 1 GM/10ML PO SUSP: ORAL | 0 refills | Status: DC

## 2015-11-02 MED ORDER — GI COCKTAIL ~~LOC~~: 30.0000 mL | Freq: Two times a day (BID) | ORAL | 0 refills | Status: DC

## 2015-11-02 MED ORDER — HYOSCYAMINE SULFATE 0.125 MG SL SUBL: 0.1250 mg | SUBLINGUAL_TABLET | SUBLINGUAL | 0 refills | Status: DC | PRN

## 2015-11-02 NOTE — Patient Instructions (Addendum)
We sent prescriptions to [facility]  1. Gi Cocktail 2. Carafate 1 gram 3 times daily. 3. Levsin 01.25mg  q4-6h  You have been scheduled for an endoscopy. Please follow written instructions given to you at your visit today. If you use inhalers (even only as needed), please bring them with you on the day of your procedure. Your physician has requested that you go to www.[email or web address].com and enter the access code given to you at your visit today. This web site gives a general overview about your procedure. However, you should still follow specific instructions given to you by our office regarding your preparation for the procedure.

## 2015-11-02 NOTE — Progress Notes (Signed)
Chief Complaint: Epigastric abdominal pain  HPI: [name] is a  50 year old Caucasian female with a past medical history of esophagitis and reflux, who was referred to me by Aletha Halim., PA-C for a complaint of epigastric abdominal pain . She is a former patient of Dr. Buel Ream.   Independent review of patient's last EGD report and images from 11/27/12 shows the duodenal mucosa had no abnormalities in the bulb and second portion of the duodenum, mild antral gastropathy and the mucosa of the esophagus appeared normal. Pathology revealed benign small bowel mucosa in the duodenum and benign squamous mucosa in the esophagus.  Independent review of patient's last colonoscopy report and images from 07/02/09 show no polyps or cancers and otherwise normal exam. Pathology revealed no significant inflammation or other abnormalities in the colon mucosa.  Per chart review patient was recently seen in the ER on 10/29/15 for epigastric pain, a CT of the abdomen and pelvis with contrast was completed and showed no acute abnormality in the abdomen or pelvis and punctate nonobstructing left renal calculus. The patient was started on Nexium twice a day with ranitidine daily at bedtime and told to use Percocet only if pain persisted despite use of Tums and/or Carafate. H. pylori antibody was negative on 10/29/15. Other labs including CMP and CBC were within normal limits.  Today, the patient explains that 3 weeks ago she had an increase in reflux symptoms for about a week where she experienced an acidic tasting material in her mouth as well as esophageal spasms. The patient explains it took about a week with and increase in her Nexium to 40 mg twice daily instead of qd to "calm this down", but as soon as her reflux went away she started with a severe epigastric pain which was constant. She went to the ER initially on Friday, 10/22/15, but tells me that they did a right upper quadrant ultrasound which was normal  and "didn't really help her". She continued with an epigastric pain which sometimes radiated through to her back and was a 10 /10 prohibiting her from her daily activities over the next week. She then went to the ER at [facility] as above. She was given Percocet 5 mg which she has been taking every 2-3 hours, typically half a tab in order to control the pain. This is on top of her Nexium 40 mg twice daily and Zantac 150 mg which she added on Saturday night. Associated symptoms include a feeling of chills and a low-grade fever as well as some nausea.  The patient is very concerned about "what could be causing this" and requests to have an EGD for further evaluation. She does tell me she believes her increase in reflux symptoms is related to her use of meloxicam approximately 3 months ago for an meniscus injury in her left knee.  Patient denies vomiting, dysphagia, change in bowel habits, change in diet, weight loss, dizziness or syncope.     Past Medical History:  Diagnosis Date  . Alopecia, unspecified   . Anemia, unspecified   . Anxiety state, unspecified   . Bulging disc   . Depressive disorder, not elsewhere classified   . Diverticulosis of colon (without mention of hemorrhage)    CT Scan  . Esophageal reflux   . Esophagitis 1998  . Hiatal hernia 1998  . Hypercholesteremia   . Migraine   . Opioid abuse, in remission     Past Surgical History:  Procedure Laterality Date  . ABLATION    .  CARDIAC CATHETERIZATION    . KNEE SURGERY    . TONSILLECTOMY      Current Outpatient Prescriptions  Medication Sig Dispense Refill  . ALPRAZolam (XANAX) 0.25 MG tablet Take 0.25-0.5 mg by mouth at bedtime as needed for sleep.     . Biotin 5000 MCG CAPS Take 10,000 mcg by mouth daily.     . butalbital-acetaminophen-caffeine (ESGIC PLUS) 50-500-40 MG per tablet Take 1 tablet by mouth every 4 (four) hours as needed for pain or headache.    . cetirizine (ZYRTEC) 10 MG tablet Take 10 mg by mouth every  morning.     . cloNIDine (CATAPRES) 0.2 MG tablet Take 0.2 mg by mouth at bedtime.     Marland Kitchen esomeprazole (NEXIUM) 40 MG capsule Take 1 capsule (40 mg total) by mouth 2 (two) times daily before a meal. 30 capsule 1  . estradiol (ESTRACE) 2 MG tablet Take 2 mg by mouth at bedtime.    . finasteride (PROSCAR) 5 MG tablet Take 2.5 mg by mouth daily.     . hydrocortisone valerate cream (WESTCORT) 0.2 % Apply 1 application topically daily as needed (eczema).     . hydrOXYzine (ATARAX/VISTARIL) 10 MG tablet Take 1 tablet by mouth daily as needed for itching.   0  . Melatonin 10 MG CAPS Take 1 capsule by mouth at bedtime.    . Multiple Vitamin (MULITIVITAMIN WITH MINERALS) TABS Take 1 tablet by mouth daily.    . ondansetron (ZOFRAN) 8 MG tablet Take 1 tablet (8 mg total) by mouth every 4 (four) hours as needed. 8 tablet 0  . oxyCODONE-acetaminophen (PERCOCET/ROXICET) 5-325 MG tablet Take 1-2 tablets by mouth every 4 (four) hours as needed for severe pain. 15 tablet 0  . oxyCODONE-acetaminophen (PERCOCET/ROXICET) 5-325 MG tablet Take 1 tablet by mouth once. 5 tablet 0  . progesterone (PROMETRIUM) 100 MG capsule Take 100 mg by mouth at bedtime.    . ranitidine (ZANTAC) 150 MG capsule Take 1 capsule (150 mg total) by mouth every evening. 30 capsule 0  . spironolactone (ALDACTONE) 25 MG tablet Take 25 mg by mouth daily.    . traZODone (DESYREL) 100 MG tablet Take 100 mg by mouth at bedtime as needed for sleep.     . valACYclovir (VALTREX) 1000 MG tablet Take 500 mg by mouth 2 (two) times daily as needed (fever blister).      No current facility-administered medications for this visit.     Allergies as of 11/02/2015 - Review Complete 10/30/2015  Allergen Reaction Noted  . Amoxicillin-pot clavulanate Other (See Comments) 06/15/2009  . Metoclopramide hcl Other (See Comments) 06/15/2009  . Erythromycin Rash   . Sulfonamide derivatives Rash     Family History  Problem Relation Age of Onset  . Colon cancer  Neg Hx   . Stomach cancer Neg Hx   . Heart disease Father     CABG  . Breast cancer Mother     Social History   Social History  . Marital status: Married    Spouse name: N/A  . Number of children: N/A  . Years of education: N/A   Occupational History  . Not on file.   Social History Main Topics  . Smoking status: Current Every Day Smoker    Packs/day: 1.00    Last attempt to quit: 10/25/2009  . Smokeless tobacco: Never Used  . Alcohol use Yes     Comment: "once a week"  . Drug use: No  . Sexual activity: Yes  Birth control/ protection: None, Post-menopausal   Other Topics Concern  . Not on file   Social History Narrative  . No narrative on file    Review of Systems:    Constitutional: Positive for low-grade fever and a feeling of chillsNo weight loss HEENT: Eyes: Nchange in vision               Ears, Nose, Throat:  No change in hearing Skin: No rash or itching Cardiovascular: No chest pain, chest pressure  or palpitations    Respiratory: No SOB or cough Gastrointestinal: See HPI and otherwise negative Genitourinary: No dysuria or change in urinary frequency Neurological: No headache, dizziness or syncope Musculoskeletal: No muscle or back pain Hematologic: No anemia or bleeding  Psychiatric: No history of depression or anxiety   Physical Exam:  Vital signs: BP 110/62 (BP Location: Left Arm, Patient Position: Sitting, Cuff Size: Large)   Pulse 76   Ht 5' (1.524 m)   Wt 109 lb 12.8 oz (49.8 kg)   SpO2 98%   BMI 21.44 kg/m  General:   Pleasant thin-appearing Caucasian female whors to be in NAD, Well developed, Well nourished, alert and cooperative Head:  Normocephalic and atraumatic. Eyes:   PEERL, EOMI. No icterus. Conjunctiva pink. Ears:  Normal auditory acuity. Neck:  Supple Throat: Oral cavity and pharynx without inflammation, swelling or lesion.  Lungs: Respirations even and unlabored. Lungs clear to auscultation bilaterally.   No wheezes, crackles,  or rhonchi.  Heart: Normal S1, S2. No MRG. Regular rate and rhythm. No peripheral edema, cyanosis or pallor.  Abdomen:  Soft, non-distended, moderate TTP in the epigastrium and left upper quadrant with some guarding . No rebound.Normal bowel sounds. No appreciable masses or hepatomegaly. Rectal:  Not performed.  Msk:  Symmetrical without gross deformities. Peripheral pulses intact.  Extremities:  Without edema, no deformity or joint abnormality. Neurologic:  Alert and  oriented x4;  grossly normal neurologically.  Skin:   Dry and intact without significant lesions or rashes. Psychiatric: Oriented to person, place and time. Demonstrates good judgement and reason without abnormal affect or behaviors.  RELEVANT LABS AND IMAGING: CBC    Component Value Date/Time   WBC 8.7 10/29/2015 1812   RBC 4.76 10/29/2015 1812   HGB 14.6 10/29/2015 1812   HCT 43.7 10/29/2015 1812   PLT 250 10/29/2015 1812   MCV 91.8 10/29/2015 1812   MCH 30.7 10/29/2015 1812   MCHC 33.4 10/29/2015 1812   RDW 13.2 10/29/2015 1812   LYMPHSABS 3.5 04/05/2014 2203   MONOABS 0.5 04/05/2014 2203   EOSABS 0.2 04/05/2014 2203   BASOSABS 0.0 04/05/2014 2203    CMP     Component Value Date/Time   NA 139 10/29/2015 1812   K 4.6 10/29/2015 1812   CL 107 10/29/2015 1812   CO2 28 10/29/2015 1812   GLUCOSE 95 10/29/2015 1812   BUN 11 10/29/2015 1812   CREATININE 0.79 10/29/2015 1812   CALCIUM 9.5 10/29/2015 1812   PROT 6.6 10/29/2015 1812   ALBUMIN 4.1 10/29/2015 1812   AST 26 10/29/2015 1812   ALT 16 10/29/2015 1812   ALKPHOS 53 10/29/2015 1812   BILITOT 0.2 (L) 10/29/2015 1812   GFRNONAA >60 10/29/2015 1812   GFRAA >60 10/29/2015 1812    Assessment: 1. GERD: Increase in symptoms over the past 3 months with dramatic increase over the past 3 weeks, no longer controlled on Nexium 40 mg twice a day and Zantac at night, associated with a large amount  of epigastric abdominal pain which is constant as well as nausea  and some esophageal spasm; consider PUD versus gastritis versus esophagitis versus H. pylori versus other 2. Nausea:See above 3. Epigastric abdominal pain: See above  Plan: 1. Recommend patient proceed with an EGD for further evaluation of severe epigastric pain and increase in reflux symptoms, discussed risks, benefits and limitations of this procedure with the patient and she agrees to proceed. She will be scheduled with Dr. Silverio Decamp tomorrow. 2. Patient should continue her Nexium 40 mg by mouth twice a day, 30-60 minutes before eating and Zantac 150mg  qhs 3. Added Carafate 1 g 3 times a day, 1 hour before or 2 hours after eating and other medications. Dosing of the medication was discussed in detail with the patient and she verbalized understanding. 4. Will provide the patient with GI cocktail, 5-10 mL's every 4-6 hours as needed for abdominal pain for the next 15 days 5. Patient requested some Levsin 0.125 mg sublingual tabs every 4-6 hours for esophageal spasm 6. Patient does ask me about further pain medications, explained that I hope that the Carafate and GI cocktail would be enough. She can discuss this with Dr. Silverio Decamp tomorrow if pain persists. 7. Patient should follow in clinic according to Dr. Woodward Ku recommendations after procedure tomorrow.     Ellouise Newer, PA-C [facility] 11/02/2015, 1:10 PM  Cc: Aletha Halim., PA-C

## 2015-11-03 ENCOUNTER — Encounter: Payer: Self-pay | Admitting: Gastroenterology

## 2015-11-03 ENCOUNTER — Ambulatory Visit (AMBULATORY_SURGERY_CENTER): Payer: 59 | Admitting: Gastroenterology

## 2015-11-03 VITALS — BP 125/65 | HR 47 | Temp 98.7°F | Resp 16 | Ht 60.0 in | Wt 109.0 lb

## 2015-11-03 DIAGNOSIS — R1013 Epigastric pain: Secondary | ICD-10-CM | POA: Diagnosis not present

## 2015-11-03 NOTE — Progress Notes (Signed)
Dental advisory given to patient 

## 2015-11-03 NOTE — Progress Notes (Signed)
Called to room to assist during endoscopic procedure.  Patient ID and intended procedure confirmed with present staff. Received instructions for my participation in the procedure from the performing physician.  

## 2015-11-03 NOTE — Op Note (Signed)
[facility] Patient Name: Jasmine Allen Procedure Date: 11/03/2015 3:44 PM MRN: [id number] Endoscopist: Mauri Pole , MD Age: 50 Referring MD:  Date of Birth: 1965-08-08 Gender: Female Account #: [id number] Procedure:                Upper GI endoscopy Indications:              Upper abdominal symptoms that persist despite an                            appropriate trial of therapy Medicines:                Monitored Anesthesia Care Procedure:                Pre-Anesthesia Assessment:                           - Prior to the procedure, a History and Physical                            was performed, and patient medications and                            allergies were reviewed. The patient's tolerance of                            previous anesthesia was also reviewed. The risks                            and benefits of the procedure and the sedation                            options and risks were discussed with the patient.                            All questions were answered, and informed consent                            was obtained. Prior Anticoagulants: The patient has                            taken no previous anticoagulant or antiplatelet                            agents. ASA Grade Assessment: II - A patient with                            mild systemic disease. After reviewing the risks                            and benefits, the patient was deemed in                            satisfactory condition to undergo the procedure.  After obtaining informed consent, the endoscope was                            passed under direct vision. Throughout the                            procedure, the patient's blood pressure, pulse, and                            oxygen saturations were monitored continuously. The                            Model GIF-HQ190 [id number]) scope was introduced                            through the mouth, and  advanced to the second part                            of duodenum. The upper GI endoscopy was                            accomplished without difficulty. The patient                            tolerated the procedure well. Scope In: Scope Out: Findings:                 The esophagus was normal.                           The entire examined stomach was normal. Biopsies                            were taken with a cold forceps for Helicobacter                            pylori testing.                           The examined duodenum was normal. Complications:            No immediate complications. Estimated Blood Loss:     Estimated blood loss was minimal. Impression:               - Normal esophagus.                           - Normal stomach. Biopsied.                           - Normal examined duodenum. Recommendation:           - Patient has a contact number available for                            emergencies. The signs and symptoms of potential  delayed complications were discussed with the                            patient. Return to normal activities tomorrow.                            Written discharge instructions were provided to the                            patient.                           - Resume previous diet.                           - Continue present medications.                           - Await pathology results.                           - No aspirin, ibuprofen, naproxen, or other                            non-steroidal anti-inflammatory drugs. Mauri Pole, MD 11/03/2015 4:07:02 PM This report has been signed electronically.

## 2015-11-03 NOTE — Progress Notes (Signed)
A/ox3 pleased with MAC, report to Robbin RN 

## 2015-11-03 NOTE — Patient Instructions (Addendum)
Biopsies taken today.  NO aspirin, ibuprofen, naproxen, or any other non-steriodal anti-inflammatory medications.  Try using Phazyme OTC 1-2 capsules 3 times a day as needed.  Call us with any questions or concerns. Thank you!  YOU HAD AN ENDOSCOPIC PROCEDURE TODAY AT [facility]:   Refer to the procedure report that was given to you for any specific questions about what was found during the examination.  If the procedure report does not answer your questions, please call your gastroenterologist to clarify.  If you requested that your care partner not be given the details of your procedure findings, then the procedure report has been included in a sealed envelope for you to review at your convenience later.  YOU SHOULD EXPECT: Some feelings of bloating in the abdomen. Passage of more gas than usual.  Walking can help get rid of the air that was put into your GI tract during the procedure and reduce the bloating. If you had a lower endoscopy (such as a colonoscopy or flexible sigmoidoscopy) you may notice spotting of blood in your stool or on the toilet paper. If you underwent a bowel prep for your procedure, you may not have a normal bowel movement for a few days.  Please Note:  You might notice some irritation and congestion in your nose or some drainage.  This is from the oxygen used during your procedure.  There is no need for concern and it should clear up in a day or so.  SYMPTOMS TO REPORT IMMEDIATELY:    Following upper endoscopy (EGD)  Vomiting of blood or coffee ground material  New chest pain or pain under the shoulder blades  Painful or persistently difficult swallowing  New shortness of breath  Fever of 100F or higher  Black, tarry-looking stools  For urgent or emergent issues, a gastroenterologist can be reached at any hour by calling [phone].   DIET: Your first meal following the procedure should be a small meal and then it is ok to progress to your  normal diet. Heavy or fried foods are harder to digest and may make you feel nauseous or bloated.  Likewise, meals heavy in dairy and vegetables can increase bloating.  Drink plenty of fluids but you should avoid alcoholic beverages for 24 hours.  ACTIVITY:  You should plan to take it easy for the rest of today and you should NOT DRIVE or use heavy machinery until tomorrow (because of the sedation medicines used during the test).    FOLLOW UP: Our staff will call the number listed on your records the next business day following your procedure to check on you and address any questions or concerns that you may have regarding the information given to you following your procedure. If we do not reach you, we will leave a message.  However, if you are feeling well and you are not experiencing any problems, there is no need to return our call.  We will assume that you have returned to your regular daily activities without incident.  If any biopsies were taken you will be contacted by phone or by letter within the next 1-3 weeks.  Please call us at [phone] if you have not heard about the biopsies in 3 weeks.    SIGNATURES/CONFIDENTIALITY: You and/or your care partner have signed paperwork which will be entered into your electronic medical record.  These signatures attest to the fact that that the information above on your After Visit Summary has been reviewed and  is understood.  Full responsibility of the confidentiality of this discharge information lies with you and/or your care-partner.

## 2015-11-03 NOTE — Progress Notes (Signed)
Reviewed and agree with documentation and assessment and plan. K. Veena Nandigam , MD   

## 2015-11-04 ENCOUNTER — Telehealth: Payer: Self-pay

## 2015-11-04 NOTE — Telephone Encounter (Signed)
  Follow up Call-  Call back number 11/03/2015  Post procedure Call Back phone  # [phone]  Permission to leave phone message Yes  Some recent data might be hidden     Patient questions:  Do you have a fever, pain , or abdominal swelling? No. Pain Score  0 *  Have you tolerated food without any problems? Yes.    Have you been able to return to your normal activities? Yes.    Do you have any questions about your discharge instructions: Diet   No. Medications  No. Follow up visit  No.  Do you have questions or concerns about your Care? No.  Actions: * If pain score is 4 or above: No action needed, pain <4.

## 2015-11-10 ENCOUNTER — Encounter: Payer: Self-pay | Admitting: Gastroenterology

## 2015-11-24 ENCOUNTER — Telehealth: Payer: Self-pay | Admitting: Gastroenterology

## 2015-11-25 MED ORDER — ESOMEPRAZOLE MAGNESIUM 40 MG PO CPDR: 40.0000 mg | DELAYED_RELEASE_CAPSULE | Freq: Two times a day (BID) | ORAL | 2 refills | Status: DC

## 2015-11-25 NOTE — Telephone Encounter (Signed)
Continue Nexium and Take Zantac only as needed at bedtime. Thanks

## 2015-11-25 NOTE — Telephone Encounter (Signed)
Has no more Nexium to take for today. Is completely out.

## 2015-11-25 NOTE — Telephone Encounter (Signed)
Dr Silverio Decamp, Does patient need to continue the Zantac?

## 2015-11-25 NOTE — Telephone Encounter (Signed)
Called patient to inform to take Zantac as needed , she said she was doing so much better and didn't think she was going to take the Zantac anymore

## 2016-03-29 ENCOUNTER — Other Ambulatory Visit: Payer: Self-pay | Admitting: Gastroenterology

## 2016-04-13 ENCOUNTER — Emergency Department (HOSPITAL_COMMUNITY)
Admission: EM | Admit: 2016-04-13 | Discharge: 2016-04-13 | Disposition: A | Discharge status: Home / Self Care | Payer: 59 | Attending: Emergency Medicine | Admitting: Emergency Medicine

## 2016-04-13 ENCOUNTER — Encounter (HOSPITAL_COMMUNITY): Payer: Self-pay | Admitting: Emergency Medicine

## 2016-04-13 DIAGNOSIS — Z87891 Personal history of nicotine dependence: Secondary | ICD-10-CM | POA: Insufficient documentation

## 2016-04-13 DIAGNOSIS — J029 Acute pharyngitis, unspecified: Secondary | ICD-10-CM | POA: Insufficient documentation

## 2016-04-13 DIAGNOSIS — R110 Nausea: Secondary | ICD-10-CM | POA: Diagnosis not present

## 2016-04-13 DIAGNOSIS — J3489 Other specified disorders of nose and nasal sinuses: Secondary | ICD-10-CM | POA: Insufficient documentation

## 2016-04-13 DIAGNOSIS — M549 Dorsalgia, unspecified: Secondary | ICD-10-CM | POA: Insufficient documentation

## 2016-04-13 DIAGNOSIS — R0981 Nasal congestion: Secondary | ICD-10-CM | POA: Diagnosis present

## 2016-04-13 DIAGNOSIS — R51 Headache: Secondary | ICD-10-CM | POA: Diagnosis not present

## 2016-04-13 DIAGNOSIS — R6889 Other general symptoms and signs: Secondary | ICD-10-CM

## 2016-04-13 DIAGNOSIS — R05 Cough: Secondary | ICD-10-CM | POA: Insufficient documentation

## 2016-04-13 DIAGNOSIS — Z79899 Other long term (current) drug therapy: Secondary | ICD-10-CM | POA: Diagnosis not present

## 2016-04-13 MED ORDER — OSELTAMIVIR PHOSPHATE 75 MG PO CAPS: 75.0000 mg | ORAL_CAPSULE | Freq: Two times a day (BID) | ORAL | 0 refills | Status: DC

## 2016-04-13 NOTE — Discharge Instructions (Signed)
Your examination suggest flulike symptoms. Your oxygen level is 99% which is within normal limits. Please use Tamiflu 2 times daily until all taken. Please use ibuprofen every 6 hours around-the-clock over the next 2 or 3 days for aching and fever. Use the decongesting medication of your choice. Please increase water, juices, Gatorade, popsicles, etc. Hydration is extremely important. Please wash hands frequently, and usually mask until symptoms have resolved. Please see Jasmine Allen, or return to the emergency department if not improving.

## 2016-04-13 NOTE — ED Triage Notes (Signed)
Patient complaining of generalized body aches and nasal congestion starting this morning.

## 2016-04-13 NOTE — ED Provider Notes (Signed)
[facility] DEPT Provider Note   CSN: [id number] Arrival date & time: 04/13/16  1703     History   Chief Complaint Chief Complaint  Patient presents with  . Generalized Body Aches  . Nasal Congestion    HPI Jasmine Allen is a 51 y.o. female.  The history is provided by the patient.  URI   This is a new problem. The current episode started yesterday. The problem has been gradually worsening. The maximum temperature recorded prior to her arrival was 100 to 100.9 F. Associated symptoms include nausea, congestion, headaches, rhinorrhea, sore throat and cough. Pertinent negatives include no diarrhea and no vomiting. Treatments tried: tylenol. The treatment provided mild relief.    Past Medical History:  Diagnosis Date  . Alopecia, unspecified   . Anemia, unspecified   . Anxiety state, unspecified   . Bulging disc   . Depressive disorder, not elsewhere classified   . Diverticulosis of colon (without mention of hemorrhage)    CT Scan  . Esophageal reflux   . Esophagitis 1998  . Hiatal hernia 1998  . Hypercholesteremia   . Migraine   . Opioid abuse, in remission     Patient Active Problem List   Diagnosis Date Noted  . Chest pain 04/07/2014  . Smoking 04/07/2014  . ANEMIA, IRON DEFICIENCY 10/20/2009  . DEPRESSION 06/15/2009  . GERD 06/15/2009  . IRRITABLE BOWEL SYNDROME 06/15/2009  . RECTAL BLEEDING 06/15/2009    Past Surgical History:  Procedure Laterality Date  . ABLATION    . CARDIAC CATHETERIZATION    . KNEE SURGERY    . TONSILLECTOMY      OB History    No data available       Home Medications    Prior to Admission medications   Medication Sig Start Date End Date Taking? Authorizing Provider  ALPRAZolam (XANAX) 0.25 MG tablet Take 0.25-0.5 mg by mouth at bedtime as needed for sleep.  10/21/13  Yes Historical Provider, MD  Biotin 5000 MCG CAPS Take 10,000 mcg by mouth daily.    Yes Historical Provider, MD  cetirizine (ZYRTEC) 10 MG tablet Take 10  mg by mouth every morning.    Yes Historical Provider, MD  cloNIDine (CATAPRES) 0.2 MG tablet Take 0.3 mg by mouth at bedtime.    Yes Historical Provider, MD  esomeprazole (NEXIUM) 40 MG capsule take 1 capsule by mouth twice a day before meals 03/29/16  Yes Mauri Pole, MD  estradiol (ESTRACE) 2 MG tablet Take 2 mg by mouth at bedtime.   Yes Historical Provider, MD  fluorometholone (FML) 0.1 % ophthalmic suspension Place 1 drop into the left eye 2 (two) times daily. 04/05/16  Yes Historical Provider, MD  hydrocortisone valerate cream (WESTCORT) 0.2 % Apply 1 application topically daily as needed (eczema).  08/21/12  Yes Historical Provider, MD  hydrOXYzine (ATARAX/VISTARIL) 10 MG tablet Take 1 tablet by mouth daily as needed for itching.  10/07/15  Yes Historical Provider, MD  Melatonin 10 MG CAPS Take 1 capsule by mouth at bedtime.   Yes Historical Provider, MD  Multiple Vitamin (MULITIVITAMIN WITH MINERALS) TABS Take 1 tablet by mouth daily.   Yes Historical Provider, MD  progesterone (PROMETRIUM) 100 MG capsule Take 100 mg by mouth at bedtime.   Yes Historical Provider, MD  spironolactone (ALDACTONE) 25 MG tablet Take 25 mg by mouth daily.   Yes Historical Provider, MD  traZODone (DESYREL) 100 MG tablet Take 100 mg by mouth at bedtime as needed for sleep.  10/17/12  Yes Historical Provider, MD  ranitidine (ZANTAC) 150 MG capsule Take 1 capsule (150 mg total) by mouth every evening. 10/30/15   Allie Bossier, MD  sucralfate (CARAFATE) 1 GM/10ML suspension Take 1 gram, 1 hours before or two hours after eating and other medications. 11/02/15   Levin Erp, PA    Family History Family History  Problem Relation Age of Onset  . Heart disease Father     CABG  . Breast cancer Mother   . Colon cancer Neg Hx   . Stomach cancer Neg Hx     Social History Social History  Substance Use Topics  . Smoking status: Former Smoker    Packs/day: 0.25    Quit date: 01/25/2010  . Smokeless  tobacco: Never Used  . Alcohol use Yes     Comment: "once a week"     Allergies   Amoxicillin-pot clavulanate; Metoclopramide hcl; Erythromycin; and Sulfonamide derivatives   Review of Systems Review of Systems  HENT: Positive for congestion, rhinorrhea and sore throat.   Respiratory: Positive for cough.   Gastrointestinal: Positive for nausea. Negative for diarrhea and vomiting.  Musculoskeletal: Positive for back pain.  Neurological: Positive for headaches.  Psychiatric/Behavioral: The patient is nervous/anxious.   All other systems reviewed and are negative.    Physical Exam Updated Vital Signs BP 119/78 (BP Location: Left Arm)   Pulse 72   Temp 98.4 F (36.9 C) (Oral)   Resp 17   Ht 5' (1.524 m)   Wt 48.5 kg   SpO2 99%   BMI 20.90 kg/m   Physical Exam  Constitutional: She is oriented to person, place, and time. She appears well-developed and well-nourished.  Non-toxic appearance.  HENT:  Head: Normocephalic.  Right Ear: Tympanic membrane and external ear normal.  Left Ear: Tympanic membrane and external ear normal.  Nasal congestion.  Eyes: EOM and lids are normal. Pupils are equal, round, and reactive to light.  Neck: Normal range of motion. Neck supple. Carotid bruit is not present.  Cardiovascular: Normal rate, regular rhythm, normal heart sounds, intact distal pulses and normal pulses.   Pulmonary/Chest: No respiratory distress. She has no wheezes.  Course breath sounds. No wheezes.  Abdominal: Soft. Bowel sounds are normal. There is no tenderness. There is no guarding.  Musculoskeletal: Normal range of motion.  Lymphadenopathy:       Head (right side): No submandibular adenopathy present.       Head (left side): No submandibular adenopathy present.    She has no cervical adenopathy.  Neurological: She is alert and oriented to person, place, and time. She has normal strength. No cranial nerve deficit or sensory deficit.  Skin: Skin is warm and dry.    Psychiatric: She has a normal mood and affect. Her speech is normal.  Nursing note and vitals reviewed.    ED Treatments / Results  Labs (all labs ordered are listed, but only abnormal results are displayed) Labs Reviewed - No data to display  EKG  EKG Interpretation None       Radiology No results found.  Procedures Procedures (including critical care time)  Medications Ordered in ED Medications - No data to display   Initial Impression / Assessment and Plan / ED Course  I have reviewed the triage vital signs and the nursing notes.  Pertinent labs & imaging results that were available during my care of the patient were reviewed by me and considered in my medical decision making (see chart for details).  Clinical Course     *I have reviewed nursing notes, vital signs, and all appropriate lab and imaging results for this patient.**  Final Clinical Impressions(s) / ED Diagnoses  Pulse oximetry is 100% on room air. Vital signs within normal limits. The examination favors flulike symptoms. The plan at this time is for the patient be placed on Tamiflu, increase fluids, wash hands frequently, and use Tylenol and/or ibuprofen for fever and aching. Patient will also use a decongesting medication of her choice. I've asked patient to see her primary physician or return to the emergency department if not improving.    Final diagnoses:  None    New Prescriptions Discharge Medication List as of 04/13/2016  7:08 PM    START taking these medications   Details  oseltamivir (TAMIFLU) 75 MG capsule Take 1 capsule (75 mg total) by mouth 2 (two) times daily., Starting Thu 04/13/2016, Print         Lily Kocher, PA-C 04/14/16 [id number]    Pattricia Boss, MD 04/17/16 [phone]

## 2016-04-13 NOTE — ED Notes (Signed)
Pt reports bodyache, h/a, nasal congestion since 3am this morning.  Pt alert and oriented at this time.

## 2016-06-21 ENCOUNTER — Telehealth: Payer: Self-pay

## 2016-06-21 MED ORDER — ESOMEPRAZOLE MAGNESIUM 40 MG PO CPDR: DELAYED_RELEASE_CAPSULE | ORAL | 4 refills | Status: DC

## 2016-06-21 NOTE — Telephone Encounter (Signed)
Called pt to inform we need rejection letter from pharmacy  Sent in new script to [facility] today

## 2016-06-28 ENCOUNTER — Telehealth: Payer: Self-pay | Admitting: Gastroenterology

## 2016-06-28 MED ORDER — PANTOPRAZOLE SODIUM 40 MG PO TBEC: 40.0000 mg | DELAYED_RELEASE_TABLET | Freq: Two times a day (BID) | ORAL | 6 refills | Status: AC

## 2016-06-28 NOTE — Telephone Encounter (Signed)
Nexium no longer covered at all on patients insurance, she is willing to try protonix 40 mg twice a day  Sent to pharmacy  FYI Dr Silverio Decamp

## 2016-06-28 NOTE — Telephone Encounter (Signed)
Ok to switch to [facility] [employer]

## 2016-10-02 ENCOUNTER — Other Ambulatory Visit: Payer: Self-pay | Admitting: Physician Assistant

## 2016-10-02 NOTE — Telephone Encounter (Signed)
May we refill her Levsin?  Thank you for your time.

## 2016-11-19 ENCOUNTER — Emergency Department (HOSPITAL_COMMUNITY): Payer: 59

## 2016-11-19 ENCOUNTER — Emergency Department (HOSPITAL_COMMUNITY)
Admission: EM | Admit: 2016-11-19 | Discharge: 2016-11-19 | Disposition: A | Discharge status: Home / Self Care | Payer: 59 | Attending: Emergency Medicine | Admitting: Emergency Medicine

## 2016-11-19 ENCOUNTER — Encounter (HOSPITAL_COMMUNITY): Payer: Self-pay | Admitting: Emergency Medicine

## 2016-11-19 DIAGNOSIS — R1084 Generalized abdominal pain: Secondary | ICD-10-CM | POA: Diagnosis present

## 2016-11-19 DIAGNOSIS — R1011 Right upper quadrant pain: Secondary | ICD-10-CM | POA: Diagnosis not present

## 2016-11-19 DIAGNOSIS — Z87891 Personal history of nicotine dependence: Secondary | ICD-10-CM | POA: Diagnosis not present

## 2016-11-19 DIAGNOSIS — N200 Calculus of kidney: Secondary | ICD-10-CM | POA: Diagnosis not present

## 2016-11-19 DIAGNOSIS — Z79899 Other long term (current) drug therapy: Secondary | ICD-10-CM | POA: Diagnosis not present

## 2016-11-19 LAB — URINALYSIS, ROUTINE W REFLEX MICROSCOPIC
Bilirubin Urine: NEGATIVE
Glucose, UA: NEGATIVE mg/dL
Hgb urine dipstick: NEGATIVE
Ketones, ur: NEGATIVE mg/dL
Leukocytes, UA: NEGATIVE
Nitrite: NEGATIVE
Protein, ur: NEGATIVE mg/dL
Specific Gravity, Urine: 1.018 (ref 1.005–1.030)
pH: 6 (ref 5.0–8.0)

## 2016-11-19 LAB — BASIC METABOLIC PANEL
Anion gap: 9 (ref 5–15)
BUN: 15 mg/dL (ref 6–20)
CO2: 23 mmol/L (ref 22–32)
Calcium: 9.2 mg/dL (ref 8.9–10.3)
Chloride: 106 mmol/L (ref 101–111)
Creatinine, Ser: 0.87 mg/dL (ref 0.44–1.00)
GFR calc Af Amer: >60 mL/min (ref >60)
GFR calc non Af Amer: >60 mL/min (ref >60)
Glucose, Bld: 97 mg/dL (ref 65–99)
Potassium: 3.7 mmol/L (ref 3.5–5.1)
Sodium: 138 mmol/L (ref 135–145)

## 2016-11-19 MED ORDER — OXYCODONE-ACETAMINOPHEN 5-325 MG PO TABS: 1.0000 | ORAL_TABLET | ORAL | 0 refills | Status: DC | PRN

## 2016-11-19 MED ORDER — HYDROMORPHONE HCL 1 MG/ML IJ SOLN
1.0000 mg | Freq: Once | INTRAMUSCULAR | Status: AC
  Administered 2016-11-19: 1 mg via INTRAVENOUS

## 2016-11-19 MED ORDER — NAPROXEN 500 MG PO TABS: 500.0000 mg | ORAL_TABLET | Freq: Two times a day (BID) | ORAL | 0 refills | Status: DC

## 2016-11-19 MED ORDER — KETOROLAC TROMETHAMINE 30 MG/ML IJ SOLN
INTRAMUSCULAR | Status: AC
  Filled 2016-11-19: qty 1

## 2016-11-19 MED ORDER — HYDROMORPHONE HCL 1 MG/ML IJ SOLN
INTRAMUSCULAR | Status: AC
  Filled 2016-11-19: qty 1

## 2016-11-19 MED ORDER — TAMSULOSIN HCL 0.4 MG PO CAPS: 0.4000 mg | ORAL_CAPSULE | Freq: Two times a day (BID) | ORAL | 0 refills | Status: DC

## 2016-11-19 MED ORDER — ONDANSETRON 4 MG PO TBDP: 4.0000 mg | ORAL_TABLET | Freq: Three times a day (TID) | ORAL | 0 refills | Status: DC | PRN

## 2016-11-19 MED ORDER — KETOROLAC TROMETHAMINE 30 MG/ML IJ SOLN
30.0000 mg | Freq: Once | INTRAMUSCULAR | Status: AC
  Administered 2016-11-19: 30 mg via INTRAVENOUS

## 2016-11-19 MED ORDER — ONDANSETRON HCL 4 MG/2ML IJ SOLN
INTRAMUSCULAR | Status: AC
  Filled 2016-11-19: qty 2

## 2016-11-19 MED ORDER — ONDANSETRON HCL 4 MG/2ML IJ SOLN
4.0000 mg | Freq: Once | INTRAMUSCULAR | Status: AC
  Administered 2016-11-19: 4 mg via INTRAVENOUS

## 2016-11-19 NOTE — ED Notes (Signed)
ED Provider at bedside. 

## 2016-11-19 NOTE — ED Notes (Signed)
Patient transported to CT 

## 2016-11-19 NOTE — Discharge Instructions (Signed)
You may always return to the emergency department for any increasing pain or pain that is not controlled with the medications that we have provided for you.  Please take Flomax twice a day for 5 days Zofran as needed for nausea Percocet or Naprosyn for pain  Please obtain all of your results from medical records or have your doctors office obtain the results - share them with your doctor - you should be seen at your doctors office in the next 2 days. Call today to arrange your follow up. Take the medications as prescribed. Please review all of the medicines and only take them if you do not have an allergy to them. Please be aware that if you are taking birth control pills, taking other prescriptions, ESPECIALLY ANTIBIOTICS may make the birth control ineffective - if this is the case, either do not engage in sexual activity or use alternative methods of birth control such as condoms until you have finished the medicine and your family doctor says it is OK to restart them. If you are on a blood thinner such as COUMADIN, be aware that any other medicine that you take may cause the coumadin to either work too much, or not enough - you should have your coumadin level rechecked in next 7 days if this is the case.  ?  It is also a possibility that you have an allergic reaction to any of the medicines that you have been prescribed - Everybody reacts differently to medications and while MOST people have no trouble with most medicines, you may have a reaction such as nausea, vomiting, rash, swelling, shortness of breath. If this is the case, please stop taking the medicine immediately and contact your physician.  ?  You should return to the ER if you develop severe or worsening symptoms.

## 2016-11-19 NOTE — ED Provider Notes (Signed)
[facility] DEPT Provider Note   CSN: [id number] Arrival date & time: 11/19/16  1557     History   Chief Complaint Chief Complaint  Patient presents with  . Flank Pain    HPI Jasmine Allen is a 51 y.o. female.  HPI  50 year old female who presents with a complaint of right-sided flank pain, this was acute in onset last night, seemed to resolve spontaneously and this morning she woke up pain free. Within the last couple of hours the patient had recurrent sharp and stabbing pain in the right flank radiating to the right groin which was severe, associated with some nausea, seems to be worse with palpation and movement but is there constantly. It does wax and wane in intensity. It is not associated with hematuria, vomiting, diarrhea, fevers or chills. She has had a history of kidney stones in the past and passed a kidney stone spontaneously during childbirth as well.  Past Medical History:  Diagnosis Date  . Alopecia, unspecified   . Anemia, unspecified   . Anxiety state, unspecified   . Bulging disc   . Depressive disorder, not elsewhere classified   . Diverticulosis of colon (without mention of hemorrhage)    CT Scan  . Esophageal reflux   . Esophagitis 1998  . Hiatal hernia 1998  . Hypercholesteremia   . Migraine   . Opioid abuse, in remission   . Renal disorder     Patient Active Problem List   Diagnosis Date Noted  . Chest pain 04/07/2014  . Smoking 04/07/2014  . ANEMIA, IRON DEFICIENCY 10/20/2009  . DEPRESSION 06/15/2009  . GERD 06/15/2009  . IRRITABLE BOWEL SYNDROME 06/15/2009  . RECTAL BLEEDING 06/15/2009    Past Surgical History:  Procedure Laterality Date  . ABLATION    . CARDIAC CATHETERIZATION    . KNEE SURGERY    . TONSILLECTOMY      OB History    No data available       Home Medications    Prior to Admission medications   Medication Sig Start Date End Date Taking? Authorizing Provider  ACETAMINOPHEN-BUTALBITAL 50-325 MG TABS Take 1  capsule by mouth daily as needed. 10/27/16  Yes [provider]  ALPRAZolam (XANAX) 0.25 MG tablet Take 0.25-0.5 mg by mouth at bedtime as needed for sleep.  10/21/13  Yes [provider]  atorvastatin (LIPITOR) 10 MG tablet Take 10 mg by mouth every other day. 08/02/16  Yes [provider]  Biotin 5000 MCG CAPS Take 10,000 mcg by mouth daily.    Yes [provider]  cetirizine (ZYRTEC) 10 MG tablet Take 10 mg by mouth every morning.    Yes [provider]  cloNIDine (CATAPRES) 0.2 MG tablet Take 0.3 mg by mouth at bedtime.    Yes [provider]  estradiol (ESTRACE) 2 MG tablet Take 2 mg by mouth at bedtime.   Yes [provider]  hydrocortisone valerate cream (WESTCORT) 0.2 % Apply 1 application topically daily as needed (eczema).  08/21/12  Yes [provider]  hydrOXYzine (ATARAX/VISTARIL) 10 MG tablet Take 1 tablet by mouth daily as needed for itching.  10/07/15  Yes [provider]  hyoscyamine (LEVSIN SL) 0.125 MG SL tablet dissolve 1 tablet under the tongue every 4 hours if needed 10/02/16  Yes Nandigam, Venia Minks, MD  ibuprofen (ADVIL,MOTRIN) 200 MG tablet Take 400-800 mg by mouth every 6 (six) hours as needed for fever, headache or moderate pain.   Yes [provider]  Melatonin 10 MG CAPS Take 1 capsule by mouth at bedtime.   Yes [provider]  Multiple Vitamin (MULITIVITAMIN WITH MINERALS) TABS Take 1 tablet by mouth daily.   Yes [provider]  pantoprazole (PROTONIX) 40 MG tablet Take 1 tablet (40 mg total) by mouth 2 (two) times daily before a meal. 06/28/16  Yes Nandigam, Venia Minks, MD  progesterone (PROMETRIUM) 100 MG capsule Take 100 mg by mouth at bedtime.   Yes [provider]  spironolactone (ALDACTONE) 25 MG tablet Take 25 mg by mouth daily.   Yes [provider]  traZODone (DESYREL) 100 MG tablet Take 100 mg by mouth at bedtime as needed for sleep.  10/17/12   Yes [provider]  valACYclovir (VALTREX) 1000 MG tablet Take 1,000 mg by mouth 2 (two) times daily as needed.   Yes [provider]  naproxen (NAPROSYN) 500 MG tablet Take 1 tablet (500 mg total) by mouth 2 (two) times daily with a meal. 11/19/16   Noemi Chapel, MD  ondansetron (ZOFRAN ODT) 4 MG disintegrating tablet Take 1 tablet (4 mg total) by mouth every 8 (eight) hours as needed for nausea. 11/19/16   Noemi Chapel, MD  oxyCODONE-acetaminophen (PERCOCET) 5-325 MG tablet Take 1 tablet by mouth every 4 (four) hours as needed. 11/19/16   Noemi Chapel, MD  tamsulosin (FLOMAX) 0.4 MG CAPS capsule Take 1 capsule (0.4 mg total) by mouth 2 (two) times daily. 11/19/16   Noemi Chapel, MD    Family History Family History  Problem Relation Age of Onset  . Heart disease Father        CABG  . Breast cancer Mother   . Colon cancer Neg Hx   . Stomach cancer Neg Hx     Social History Social History  Substance Use Topics  . Smoking status: Former Smoker    Packs/day: 0.25    Quit date: 01/25/2010  . Smokeless tobacco: Never Used  . Alcohol use Yes     Comment: "once a week"     Allergies   Amoxicillin-pot clavulanate; Metoclopramide hcl; Erythromycin; and Sulfonamide derivatives   Review of Systems Review of Systems  All other systems reviewed and are negative.    Physical Exam Updated Vital Signs BP (!) 153/89 (BP Location: Left Arm)   Pulse 71   Temp (!) 97.5 F (36.4 C) (Oral)   Resp 20   Ht 5' (1.524 m)   Wt 48.5 kg (107 lb)   SpO2 100%   BMI 20.90 kg/m   Physical Exam  Constitutional: She appears well-developed and well-nourished. She appears distressed.  Uncomfortable appearing, pacing the room  HENT:  Head: Normocephalic and atraumatic.  Mouth/Throat: Oropharynx is clear and moist. No oropharyngeal exudate.  Eyes: Pupils are equal, round, and reactive to light. Conjunctivae and EOM are normal. Right eye exhibits no discharge. Left eye exhibits  no discharge. No scleral icterus.  Neck: Normal range of motion. Neck supple. No JVD present. No thyromegaly present.  Cardiovascular: Normal rate, regular rhythm, normal heart sounds and intact distal pulses.  Exam reveals no gallop and no friction rub.   No murmur heard. Pulmonary/Chest: Effort normal and breath sounds normal. No respiratory distress. She has no wheezes. She has no rales.  Abdominal: Soft. Bowel sounds are normal. She exhibits no distension and no mass. There is tenderness ( some right-sided tenderness around the right upper quadrant and the right flank, very soft, no guarding).  Musculoskeletal: Normal range of motion. She exhibits no  edema or tenderness.  Lymphadenopathy:    She has no cervical adenopathy.  Neurological: She is alert. Coordination normal.  Skin: Skin is warm and dry. No rash noted. No erythema.  Psychiatric: She has a normal mood and affect. Her behavior is normal.  Nursing note and vitals reviewed.    ED Treatments / Results  Labs (all labs ordered are listed, but only abnormal results are displayed) Labs Reviewed  URINALYSIS, [place] PANEL     Radiology Ct Renal Stone Study  Result Date: 11/19/2016 CLINICAL DATA:  Right flank pain since yesterday. EXAM: CT ABDOMEN AND PELVIS WITHOUT CONTRAST TECHNIQUE: Multidetector CT imaging of the abdomen and pelvis was performed following the standard protocol without IV contrast. COMPARISON:  10/30/2015. FINDINGS: Lower chest: Unremarkable. Hepatobiliary: No focal liver abnormality is seen. No gallstones, gallbladder wall thickening, or biliary dilatation. Pancreas: Unremarkable. No pancreatic ductal dilatation or surrounding inflammatory changes. Spleen: Stable normal appearing spleen and accessory splenules. Adrenals/Urinary Tract: Normal appearing adrenal glands. Interval 3 mm calculus at the left ureterovesical junction. 3 mm and 2 mm calculi in the mid left kidney. No  right renal or ureteral calculi. No dilatation of either renal collecting system or ureter. Stomach/Bowel: Stomach is within normal limits. Appendix appears normal. No evidence of bowel wall thickening, distention, or inflammatory changes. Vascular/Lymphatic: Mild atheromatous arterial calcifications without aneurysm. No enlarged lymph nodes. Reproductive: Uterus and bilateral adnexa are unremarkable. Other: Tiny umbilical hernia containing fat. Musculoskeletal: Minimal lumbar spine degenerative changes. Mild thoracolumbar scoliosis. IMPRESSION: 1. 3 mm nonobstructing calculus at the left ureterovesical junction. 2. 2 small, nonobstructing left renal calculi. 3. No right renal or right ureteral calculi. Electronically Signed   By: Claudie Revering M.D.   On: 11/19/2016 18:17    Procedures Procedures (including critical care time)  Angiocath insertion Performed by: Johnna Acosta  Consent: Verbal consent obtained. Risks and benefits: risks, benefits and alternatives were discussed Time out: Immediately prior to procedure a "time out" was called to verify the correct patient, procedure, equipment, support staff and site/side marked as required.  Preparation: Patient was prepped and draped in the usual sterile fashion.  Vein Location: L AC  Not Ultrasound Guided  Gauge: 20  Normal blood return and flush without difficulty Patient tolerance: Patient tolerated the procedure well with no immediate complications.    Medications Ordered in ED Medications  HYDROmorphone (DILAUDID) injection 1 mg (0 mg Intravenous Hold 11/19/16 1724)  ketorolac (TORADOL) 30 MG/ML injection 30 mg (0 mg Intravenous Hold 11/19/16 1724)  ondansetron (ZOFRAN) injection 4 mg (0 mg Intravenous Hold 11/19/16 1724)     Initial Impression / Assessment and Plan / ED Course  I have reviewed the triage vital signs and the nursing notes.  Pertinent labs & imaging results that were available during my care of the patient were  reviewed by me and considered in my medical decision making (see chart for details).     Acute onset of colicky flank pain consistent with possible ureterolitsis.CT scan will be obtained, pain medications will be given, I personally started a peripheral IV to enable accelerated access to pain medications as the patient was in severe discomfort. This has improved her symptoms.  CT shows L sided distal stone Labs unreamarkable Pt given results and expressed understanding Feels MUCH better  Final Clinical Impressions(s) / ED Diagnoses   Final diagnoses:  Kidney stone    New Prescriptions New Prescriptions   NAPROXEN (NAPROSYN) 500 MG TABLET  Take 1 tablet (500 mg total) by mouth 2 (two) times daily with a meal.   ONDANSETRON (ZOFRAN ODT) 4 MG DISINTEGRATING TABLET    Take 1 tablet (4 mg total) by mouth every 8 (eight) hours as needed for nausea.   OXYCODONE-ACETAMINOPHEN (PERCOCET) 5-325 MG TABLET    Take 1 tablet by mouth every 4 (four) hours as needed.   TAMSULOSIN (FLOMAX) 0.4 MG CAPS CAPSULE    Take 1 capsule (0.4 mg total) by mouth 2 (two) times daily.     Noemi Chapel, MD 11/19/16 [phone]

## 2016-11-19 NOTE — ED Triage Notes (Addendum)
Pt reports right flank pain radiating to RLQ of abdomen. Pt reports " I think its a kidney stone." pt reports history of same. Pt tearful and reports "this pain is worse than when I had my child."

## 2016-11-29 ENCOUNTER — Other Ambulatory Visit: Payer: Self-pay | Admitting: Obstetrics and Gynecology

## 2016-11-29 DIAGNOSIS — R928 Other abnormal and inconclusive findings on diagnostic imaging of breast: Secondary | ICD-10-CM

## 2016-12-07 ENCOUNTER — Ambulatory Visit
Admission: RE | Admit: 2016-12-07 | Discharge: 2016-12-07 | Disposition: A | Discharge status: Home / Self Care | Payer: 59 | Source: Ambulatory Visit | Attending: Obstetrics and Gynecology | Admitting: Obstetrics and Gynecology

## 2016-12-07 ENCOUNTER — Ambulatory Visit: Admission: RE | Admit: 2016-12-07 | Payer: 59 | Source: Ambulatory Visit

## 2016-12-07 DIAGNOSIS — R928 Other abnormal and inconclusive findings on diagnostic imaging of breast: Secondary | ICD-10-CM

## 2017-04-10 DIAGNOSIS — C50919 Malignant neoplasm of unspecified site of unspecified female breast: Secondary | ICD-10-CM

## 2017-04-10 HISTORY — DX: Malignant neoplasm of unspecified site of unspecified female breast: C50.919

## 2017-04-12 ENCOUNTER — Other Ambulatory Visit: Payer: Self-pay | Admitting: Physician Assistant

## 2017-04-12 ENCOUNTER — Ambulatory Visit
Admission: RE | Admit: 2017-04-12 | Discharge: 2017-04-12 | Disposition: A | Discharge status: Home / Self Care | Payer: 59 | Source: Ambulatory Visit | Attending: Physician Assistant | Admitting: Physician Assistant

## 2017-04-12 DIAGNOSIS — M542 Cervicalgia: Secondary | ICD-10-CM

## 2017-04-12 DIAGNOSIS — M549 Dorsalgia, unspecified: Secondary | ICD-10-CM

## 2017-05-09 ENCOUNTER — Other Ambulatory Visit: Payer: Self-pay | Admitting: Physician Assistant

## 2017-05-09 DIAGNOSIS — M5412 Radiculopathy, cervical region: Secondary | ICD-10-CM

## 2017-05-16 ENCOUNTER — Ambulatory Visit
Admission: RE | Admit: 2017-05-16 | Discharge: 2017-05-16 | Disposition: A | Discharge status: Home / Self Care | Payer: 59 | Source: Ambulatory Visit | Attending: Physician Assistant | Admitting: Physician Assistant

## 2017-05-16 DIAGNOSIS — M5412 Radiculopathy, cervical region: Secondary | ICD-10-CM

## 2017-06-22 ENCOUNTER — Telehealth (HOSPITAL_COMMUNITY): Payer: Self-pay | Admitting: Physical Therapy

## 2017-06-22 NOTE — Telephone Encounter (Signed)
Pt is doing better and doing exercises at home, she will call the MD and get a new order if this flares up again. Please close this referral per patient.NF 06/22/17

## 2017-07-16 ENCOUNTER — Other Ambulatory Visit: Payer: Self-pay | Admitting: Obstetrics and Gynecology

## 2017-07-16 DIAGNOSIS — N63 Unspecified lump in unspecified breast: Secondary | ICD-10-CM

## 2017-07-19 ENCOUNTER — Other Ambulatory Visit: Payer: Self-pay | Admitting: Obstetrics and Gynecology

## 2017-07-19 ENCOUNTER — Ambulatory Visit
Admission: RE | Admit: 2017-07-19 | Discharge: 2017-07-19 | Disposition: A | Discharge status: Home / Self Care | Payer: 59 | Source: Ambulatory Visit | Attending: Obstetrics and Gynecology | Admitting: Obstetrics and Gynecology

## 2017-07-19 DIAGNOSIS — N63 Unspecified lump in unspecified breast: Secondary | ICD-10-CM

## 2017-07-20 ENCOUNTER — Ambulatory Visit
Admission: RE | Admit: 2017-07-20 | Discharge: 2017-07-20 | Disposition: A | Discharge status: Home / Self Care | Payer: 59 | Source: Ambulatory Visit | Attending: Obstetrics and Gynecology | Admitting: Obstetrics and Gynecology

## 2017-07-20 ENCOUNTER — Other Ambulatory Visit: Payer: Self-pay | Admitting: Obstetrics and Gynecology

## 2017-07-20 DIAGNOSIS — N63 Unspecified lump in unspecified breast: Secondary | ICD-10-CM

## 2017-08-07 ENCOUNTER — Other Ambulatory Visit: Payer: Self-pay | Admitting: General Surgery

## 2017-08-07 DIAGNOSIS — Z170 Estrogen receptor positive status [ER+]: Principal | ICD-10-CM

## 2017-08-07 DIAGNOSIS — C50311 Malignant neoplasm of lower-inner quadrant of right female breast: Secondary | ICD-10-CM

## 2017-08-13 NOTE — Progress Notes (Addendum)
PCP: Bing Matter PA-C  Cardiologist: pt denies  EKG: pt denies past year, obtained today  Stress test: pt denies past 5+ years  ECHO: pt is unsure   Cardiac Cath: 02/18/2010 under [facility] tab  Chest x-ray: pt denies past year, no recent

## 2017-08-13 NOTE — Progress Notes (Signed)
[name]            08/13/2017                          [facility], [place] - [place] AT [address] [place] [address] Phone: [phone] Fax: [phone]              Your procedure is scheduled on May 9            Report to [facility] at 0730 A.M.            Call this number if you have problems the morning of surgery:            [phone]             Remember:  NOTHING TO EAT AFTER MIDNIGHT, may only drink CLEAR LIQUIDS until [place] CAN HAVE CLEAR LIQUIDS UNTIL 0630AM THE MORNING OF SURGERY            DRINK PRE-SURGERY ENSURE BEFORE 0630 THE MORNING OF SURGERY               Continue all medications as directed by your physician except follow these medication instructions before surgery below             Take these medicines the morning of surgery with A SIP OF WATER  ALPRAZolam (XANAX) cetirizine (ZYRTEC) cyclobenzaprine (FLEXERIL) pantoprazole (PROTONIX) traZODone (DESYREL)  Please complete your PRE-SURGERY ENSURE that was given to by 0630 AM the morning of surgery.  Please, if able, drink it in one setting. DO NOT SIP.  7 days prior to surgery STOP taking any Aspirin(unless otherwise instructed by your surgeon), Aleve, Naproxen, Ibuprofen, Motrin, Advil, Goody's, BC's, all herbal medications, fish oil, and all vitamins              Do not wear jewelry, make-up or nail polish.            Do not wear lotions, powders, or perfumes, or deodorant.            Do not shave 48 hours prior to surgery.              Do not bring valuables to the hospital.            [facility] is not responsible for any belongings or valuables.  Contacts, dentures or bridgework may not be worn into surgery.  Leave your suitcase in the car.  After surgery it may be brought to your room.  For patients admitted to the hospital, discharge  time will be determined by your treatment team.  Patients discharged the day of surgery will not be allowed to drive home.    Special instructions:   [facility]- Preparing For Surgery  Before surgery, you can play an important role. Because skin is not sterile, your skin needs to be as free of germs as possible. You can reduce the number of germs on your skin by washing with CHG (chlorahexidine gluconate) Soap before surgery.  CHG is an antiseptic cleaner which kills germs and bonds with the skin to continue killing germs even after washing.  Please do not use if you have an allergy to CHG or antibacterial soaps. If your skin becomes reddened/irritated stop using the CHG.  Do not shave (including legs and underarms) for at least 48 hours prior to first CHG shower. It is OK to shave your face.  Please follow these instructions carefully.                                                                                                                     1. Shower the NIGHT BEFORE SURGERY and the MORNING OF SURGERY with CHG.   2. If you chose to wash your hair, wash your hair first as usual with your normal shampoo.  3. After you shampoo, rinse your hair and body thoroughly to remove the shampoo.  4. Use CHG as you would any other liquid soap. You can apply CHG directly to the skin and wash gently with a scrungie or a clean washcloth.   5. Apply the CHG Soap to your body ONLY FROM THE NECK DOWN.  Do not use on open wounds or open sores. Avoid contact with your eyes, ears, mouth and genitals (private parts). Wash Face and genitals (private parts)  with your normal soap.  6. Wash thoroughly, paying special attention to the area where your surgery will be performed.  7. Thoroughly rinse your body with warm water from the neck down.  8. DO NOT shower/wash with your normal soap after using and rinsing off the CHG Soap.  9. Pat yourself dry with a CLEAN TOWEL.  10. Wear CLEAN  PAJAMAS to bed the night before surgery, wear comfortable clothes the morning of surgery  11. Place CLEAN SHEETS on your bed the night of your first shower and DO NOT SLEEP WITH PETS.   Day of Surgery: Do not apply any deodorants/lotions. Please wear clean clothes to the hospital/surgery center.     Please read over the following fact sheets that you were given.

## 2017-08-13 NOTE — Pre-Procedure Instructions (Signed)
Jasmine Allen  08/13/2017      [facility], [place] - [place] [address] [place] [address] Phone: [phone] Fax: [phone]    Your procedure is scheduled on May 9  Report to [facility] at 0730 A.M.  Call this number if you have problems the morning of surgery:  [phone]   Remember:  YOU CAN HAVE [place] PRE-SURGERY ENSURE BEFORE 0630 THE MORNING OF SURGERY   NOTHING TO EAT OR DRINK AFTER 0630AM THE MORNING OF SURGERY  Continue all medications as directed by your physician except follow these medication instructions before surgery below   Take these medicines the morning of surgery with A SIP OF WATER  ALPRAZolam (XANAX) cetirizine (ZYRTEC) cyclobenzaprine (FLEXERIL) pantoprazole (PROTONIX) traZODone (DESYREL)  7 days prior to surgery STOP taking any Aspirin(unless otherwise instructed by your surgeon), Aleve, Naproxen, Ibuprofen, Motrin, Advil, Goody's, BC's, all herbal medications, fish oil, and all vitamins    Do not wear jewelry, make-up or nail polish.  Do not wear lotions, powders, or perfumes, or deodorant.  Do not shave 48 hours prior to surgery.    Do not bring valuables to the hospital.  [facility] is not responsible for any belongings or valuables.  Contacts, dentures or bridgework may not be worn into surgery.  Leave your suitcase in the car.  After surgery it may be brought to your room.  For patients admitted to the hospital, discharge time will be determined by your treatment team.  Patients discharged the day of surgery will not be allowed to drive home.    Special instructions:   [place]- Preparing For Surgery  Before surgery, you can play an important role. Because skin is not sterile, your skin needs to be as free of germs as possible. You can reduce the number of germs on your  skin by washing with CHG (chlorahexidine gluconate) Soap before surgery.  CHG is an antiseptic cleaner which kills germs and bonds with the skin to continue killing germs even after washing.  Please do not use if you have an allergy to CHG or antibacterial soaps. If your skin becomes reddened/irritated stop using the CHG.  Do not shave (including legs and underarms) for at least 48 hours prior to first CHG shower. It is OK to shave your face.  Please follow these instructions carefully.   1. Shower the NIGHT BEFORE SURGERY and the MORNING OF SURGERY with CHG.   2. If you chose to wash your hair, wash your hair first as usual with your normal shampoo.  3. After you shampoo, rinse your hair and body thoroughly to remove the shampoo.  4. Use CHG as you would any other liquid soap. You can apply CHG directly to the skin and wash gently with a scrungie or a clean washcloth.   5. Apply the CHG Soap to your body ONLY FROM THE NECK DOWN.  Do not use on open wounds or open sores. Avoid contact with your eyes, ears, mouth and genitals (private parts). Wash Face and genitals (private parts)  with your normal soap.  6. Wash thoroughly, paying special attention to the area where your surgery will be performed.  7. Thoroughly rinse your body with warm water from the neck down.  8. DO NOT shower/wash with your normal soap after using and rinsing off the CHG Soap.  9. Pat yourself dry with a CLEAN TOWEL.  10. Wear CLEAN PAJAMAS to bed the night before surgery, wear comfortable clothes the morning of surgery  11. Place CLEAN SHEETS on your bed the night of your first shower and DO NOT SLEEP WITH PETS.    Day of Surgery: Do not apply any deodorants/lotions. Please wear clean clothes to the hospital/surgery center.      Please read over the following fact sheets that you were given.

## 2017-08-14 ENCOUNTER — Encounter (HOSPITAL_COMMUNITY)
Admission: RE | Admit: 2017-08-14 | Discharge: 2017-08-14 | Disposition: A | Discharge status: Home / Self Care | Payer: 59 | Source: Ambulatory Visit | Attending: General Surgery | Admitting: General Surgery

## 2017-08-14 ENCOUNTER — Other Ambulatory Visit: Payer: Self-pay

## 2017-08-14 ENCOUNTER — Encounter (HOSPITAL_COMMUNITY): Payer: Self-pay

## 2017-08-14 DIAGNOSIS — F419 Anxiety disorder, unspecified: Secondary | ICD-10-CM | POA: Diagnosis not present

## 2017-08-14 DIAGNOSIS — F329 Major depressive disorder, single episode, unspecified: Secondary | ICD-10-CM | POA: Diagnosis not present

## 2017-08-14 DIAGNOSIS — I10 Essential (primary) hypertension: Secondary | ICD-10-CM | POA: Diagnosis not present

## 2017-08-14 DIAGNOSIS — C50311 Malignant neoplasm of lower-inner quadrant of right female breast: Secondary | ICD-10-CM | POA: Diagnosis not present

## 2017-08-14 DIAGNOSIS — Z87891 Personal history of nicotine dependence: Secondary | ICD-10-CM | POA: Diagnosis not present

## 2017-08-14 DIAGNOSIS — Z79890 Hormone replacement therapy (postmenopausal): Secondary | ICD-10-CM | POA: Diagnosis not present

## 2017-08-14 DIAGNOSIS — Z170 Estrogen receptor positive status [ER+]: Secondary | ICD-10-CM | POA: Diagnosis not present

## 2017-08-14 DIAGNOSIS — Z79899 Other long term (current) drug therapy: Secondary | ICD-10-CM | POA: Diagnosis not present

## 2017-08-14 DIAGNOSIS — K219 Gastro-esophageal reflux disease without esophagitis: Secondary | ICD-10-CM | POA: Diagnosis not present

## 2017-08-14 DIAGNOSIS — Z791 Long term (current) use of non-steroidal anti-inflammatories (NSAID): Secondary | ICD-10-CM | POA: Diagnosis not present

## 2017-08-14 HISTORY — DX: Personal history of urinary calculi: Z87.442

## 2017-08-14 HISTORY — DX: Essential (primary) hypertension: I10

## 2017-08-14 HISTORY — DX: Cardiac arrhythmia, unspecified: I49.9

## 2017-08-14 LAB — BASIC METABOLIC PANEL
Anion gap: 7 (ref 5–15)
BUN: 16 mg/dL (ref 6–20)
CO2: 28 mmol/L (ref 22–32)
Calcium: 9.5 mg/dL (ref 8.9–10.3)
Chloride: 104 mmol/L (ref 101–111)
Creatinine, Ser: 0.7 mg/dL (ref 0.44–1.00)
GFR calc Af Amer: >60 mL/min (ref >60)
GFR calc non Af Amer: >60 mL/min (ref >60)
Glucose, Bld: 96 mg/dL (ref 65–99)
Potassium: 4.3 mmol/L (ref 3.5–5.1)
Sodium: 139 mmol/L (ref 135–145)

## 2017-08-14 LAB — CBC
HCT: 43.3 % (ref 36.0–46.0)
Hemoglobin: 14.9 g/dL (ref 12.0–15.0)
MCH: 31 pg (ref 26.0–34.0)
MCHC: 34.4 g/dL (ref 30.0–36.0)
MCV: 90.2 fL (ref 78.0–100.0)
Platelets: 249 10*3/uL (ref 150–400)
RBC: 4.8 MIL/uL (ref 3.87–5.11)
RDW: 12.6 % (ref 11.5–15.5)
WBC: 7.2 10*3/uL (ref 4.0–10.5)

## 2017-08-14 MED ORDER — CHLORHEXIDINE GLUCONATE CLOTH 2 % EX PADS: 6.0000 | MEDICATED_PAD | Freq: Once | CUTANEOUS | Status: DC

## 2017-08-15 ENCOUNTER — Encounter (HOSPITAL_COMMUNITY): Payer: Self-pay

## 2017-08-16 ENCOUNTER — Encounter (HOSPITAL_COMMUNITY)
Admission: RE | Disposition: A | Discharge status: Home / Self Care | Payer: Self-pay | Source: Ambulatory Visit | Attending: General Surgery

## 2017-08-16 ENCOUNTER — Ambulatory Visit (HOSPITAL_COMMUNITY): Payer: 59 | Admitting: Emergency Medicine

## 2017-08-16 ENCOUNTER — Ambulatory Visit (HOSPITAL_COMMUNITY)
Admission: RE | Admit: 2017-08-16 | Discharge: 2017-08-16 | Disposition: A | Discharge status: Home / Self Care | Payer: 59 | Source: Ambulatory Visit | Attending: General Surgery | Admitting: General Surgery

## 2017-08-16 ENCOUNTER — Encounter (HOSPITAL_COMMUNITY): Payer: Self-pay | Admitting: *Deleted

## 2017-08-16 ENCOUNTER — Ambulatory Visit (HOSPITAL_COMMUNITY): Payer: 59 | Admitting: Certified Registered"

## 2017-08-16 DIAGNOSIS — Z170 Estrogen receptor positive status [ER+]: Principal | ICD-10-CM

## 2017-08-16 DIAGNOSIS — Z79890 Hormone replacement therapy (postmenopausal): Secondary | ICD-10-CM | POA: Insufficient documentation

## 2017-08-16 DIAGNOSIS — Z87891 Personal history of nicotine dependence: Secondary | ICD-10-CM | POA: Insufficient documentation

## 2017-08-16 DIAGNOSIS — C50311 Malignant neoplasm of lower-inner quadrant of right female breast: Secondary | ICD-10-CM

## 2017-08-16 DIAGNOSIS — K219 Gastro-esophageal reflux disease without esophagitis: Secondary | ICD-10-CM | POA: Insufficient documentation

## 2017-08-16 DIAGNOSIS — F419 Anxiety disorder, unspecified: Secondary | ICD-10-CM | POA: Insufficient documentation

## 2017-08-16 DIAGNOSIS — F329 Major depressive disorder, single episode, unspecified: Secondary | ICD-10-CM | POA: Insufficient documentation

## 2017-08-16 DIAGNOSIS — Z79899 Other long term (current) drug therapy: Secondary | ICD-10-CM | POA: Insufficient documentation

## 2017-08-16 DIAGNOSIS — I10 Essential (primary) hypertension: Secondary | ICD-10-CM | POA: Insufficient documentation

## 2017-08-16 DIAGNOSIS — Z791 Long term (current) use of non-steroidal anti-inflammatories (NSAID): Secondary | ICD-10-CM | POA: Insufficient documentation

## 2017-08-16 HISTORY — PX: BREAST LUMPECTOMY: SHX2

## 2017-08-16 HISTORY — PX: BREAST LUMPECTOMY WITH AXILLARY LYMPH NODE BIOPSY: SHX5593

## 2017-08-16 SURGERY — BREAST LUMPECTOMY WITH AXILLARY LYMPH NODE BIOPSY
Anesthesia: General | Site: Breast | Laterality: Right

## 2017-08-16 MED ORDER — LACTATED RINGERS IV SOLN
INTRAVENOUS | Status: DC
  Administered 2017-08-16: 08:00:00 via INTRAVENOUS

## 2017-08-16 MED ORDER — CIPROFLOXACIN IN D5W 400 MG/200ML IV SOLN
400.0000 mg | INTRAVENOUS | Status: AC
  Administered 2017-08-16: 400 mg via INTRAVENOUS

## 2017-08-16 MED ORDER — 0.9 % SODIUM CHLORIDE (POUR BTL) OPTIME
TOPICAL | Status: DC | PRN
  Administered 2017-08-16: 1000 mL

## 2017-08-16 MED ORDER — ACETAMINOPHEN 160 MG/5ML PO SOLN: 325.0000 mg | ORAL | Status: DC | PRN

## 2017-08-16 MED ORDER — ACETAMINOPHEN 500 MG PO TABS
ORAL_TABLET | ORAL | Status: AC
  Filled 2017-08-16: qty 2

## 2017-08-16 MED ORDER — FENTANYL CITRATE (PF) 100 MCG/2ML IJ SOLN
INTRAMUSCULAR | Status: AC
  Administered 2017-08-16: 100 ug
  Filled 2017-08-16: qty 2

## 2017-08-16 MED ORDER — OXYCODONE HCL 5 MG PO TABS: 5.0000 mg | ORAL_TABLET | Freq: Four times a day (QID) | ORAL | 0 refills | Status: DC | PRN

## 2017-08-16 MED ORDER — ONDANSETRON HCL 4 MG/2ML IJ SOLN: 4.0000 mg | Freq: Once | INTRAMUSCULAR | Status: DC | PRN

## 2017-08-16 MED ORDER — LIDOCAINE 2% (20 MG/ML) 5 ML SYRINGE
INTRAMUSCULAR | Status: DC | PRN
  Administered 2017-08-16: 100 mg via INTRAVENOUS

## 2017-08-16 MED ORDER — OXYCODONE HCL 5 MG PO TABS: 5.0000 mg | ORAL_TABLET | ORAL | Status: DC | PRN

## 2017-08-16 MED ORDER — BUPIVACAINE-EPINEPHRINE (PF) 0.25% -1:200000 IJ SOLN
INTRAMUSCULAR | Status: AC
  Filled 2017-08-16: qty 30

## 2017-08-16 MED ORDER — CIPROFLOXACIN IN D5W 400 MG/200ML IV SOLN
INTRAVENOUS | Status: AC
  Filled 2017-08-16: qty 200

## 2017-08-16 MED ORDER — HEMOSTATIC AGENTS (NO CHARGE) OPTIME
TOPICAL | Status: DC | PRN
  Administered 2017-08-16: 1 via TOPICAL

## 2017-08-16 MED ORDER — DEXMEDETOMIDINE HCL 200 MCG/2ML IV SOLN
INTRAVENOUS | Status: DC | PRN
  Administered 2017-08-16 (×5): 4 ug via INTRAVENOUS

## 2017-08-16 MED ORDER — ACETAMINOPHEN 500 MG PO TABS
1000.0000 mg | ORAL_TABLET | ORAL | Status: AC
  Administered 2017-08-16: 1000 mg via ORAL

## 2017-08-16 MED ORDER — MEPERIDINE HCL 50 MG/ML IJ SOLN: 6.2500 mg | INTRAMUSCULAR | Status: DC | PRN

## 2017-08-16 MED ORDER — EPHEDRINE SULFATE 50 MG/ML IJ SOLN
INTRAMUSCULAR | Status: AC
  Filled 2017-08-16: qty 1

## 2017-08-16 MED ORDER — KETOROLAC TROMETHAMINE 15 MG/ML IJ SOLN
15.0000 mg | INTRAMUSCULAR | Status: AC
  Administered 2017-08-16: 15 mg via INTRAVENOUS

## 2017-08-16 MED ORDER — FENTANYL CITRATE (PF) 100 MCG/2ML IJ SOLN: 25.0000 ug | INTRAMUSCULAR | Status: DC | PRN

## 2017-08-16 MED ORDER — DEXAMETHASONE SODIUM PHOSPHATE 10 MG/ML IJ SOLN
INTRAMUSCULAR | Status: DC | PRN
  Administered 2017-08-16: 10 mg via INTRAVENOUS

## 2017-08-16 MED ORDER — ONDANSETRON HCL 4 MG/2ML IJ SOLN
INTRAMUSCULAR | Status: DC | PRN
  Administered 2017-08-16 (×2): 4 mg via INTRAVENOUS

## 2017-08-16 MED ORDER — BUPIVACAINE-EPINEPHRINE 0.25% -1:200000 IJ SOLN
INTRAMUSCULAR | Status: DC | PRN
  Administered 2017-08-16: 11 mL

## 2017-08-16 MED ORDER — LIDOCAINE 2% (20 MG/ML) 5 ML SYRINGE
INTRAMUSCULAR | Status: AC
  Filled 2017-08-16: qty 5

## 2017-08-16 MED ORDER — ENSURE PRE-SURGERY PO LIQD
296.0000 mL | Freq: Once | ORAL | Status: DC
  Filled 2017-08-16: qty 296

## 2017-08-16 MED ORDER — OXYCODONE HCL 5 MG PO TABS
ORAL_TABLET | ORAL | Status: AC
  Filled 2017-08-16: qty 1

## 2017-08-16 MED ORDER — ACETAMINOPHEN 325 MG PO TABS: 325.0000 mg | ORAL_TABLET | ORAL | Status: DC | PRN

## 2017-08-16 MED ORDER — CELECOXIB 200 MG PO CAPS
ORAL_CAPSULE | ORAL | Status: AC
  Filled 2017-08-16: qty 1

## 2017-08-16 MED ORDER — GABAPENTIN 100 MG PO CAPS
100.0000 mg | ORAL_CAPSULE | ORAL | Status: AC
  Administered 2017-08-16: 100 mg via ORAL
  Filled 2017-08-16: qty 1

## 2017-08-16 MED ORDER — FENTANYL CITRATE (PF) 100 MCG/2ML IJ SOLN: 100.0000 ug | Freq: Once | INTRAMUSCULAR | Status: DC

## 2017-08-16 MED ORDER — MIDAZOLAM HCL 2 MG/2ML IJ SOLN: 2.0000 mg | Freq: Once | INTRAMUSCULAR | Status: DC

## 2017-08-16 MED ORDER — ONDANSETRON HCL 4 MG/2ML IJ SOLN
INTRAMUSCULAR | Status: AC
  Filled 2017-08-16: qty 4

## 2017-08-16 MED ORDER — OXYCODONE HCL 5 MG PO TABS
5.0000 mg | ORAL_TABLET | Freq: Once | ORAL | Status: AC | PRN
  Administered 2017-08-16: 5 mg via ORAL

## 2017-08-16 MED ORDER — OXYCODONE HCL 5 MG/5ML PO SOLN: 5.0000 mg | Freq: Once | ORAL | Status: AC | PRN

## 2017-08-16 MED ORDER — MIDAZOLAM HCL 2 MG/2ML IJ SOLN
INTRAMUSCULAR | Status: AC
  Administered 2017-08-16: 2 mg
  Filled 2017-08-16: qty 2

## 2017-08-16 MED ORDER — SODIUM CHLORIDE 0.9 % IJ SOLN
INTRAMUSCULAR | Status: AC
  Filled 2017-08-16: qty 10

## 2017-08-16 MED ORDER — GABAPENTIN 300 MG PO CAPS
ORAL_CAPSULE | ORAL | Status: AC
  Filled 2017-08-16: qty 1

## 2017-08-16 MED ORDER — DEXAMETHASONE SODIUM PHOSPHATE 10 MG/ML IJ SOLN
INTRAMUSCULAR | Status: AC
  Filled 2017-08-16: qty 1

## 2017-08-16 MED ORDER — EPHEDRINE SULFATE-NACL 50-0.9 MG/10ML-% IV SOSY
PREFILLED_SYRINGE | INTRAVENOUS | Status: DC | PRN
  Administered 2017-08-16 (×3): 10 mg via INTRAVENOUS

## 2017-08-16 MED ORDER — KETOROLAC TROMETHAMINE 15 MG/ML IJ SOLN
INTRAMUSCULAR | Status: AC
  Filled 2017-08-16: qty 1

## 2017-08-16 MED ORDER — PROPOFOL 10 MG/ML IV BOLUS
INTRAVENOUS | Status: AC
  Filled 2017-08-16: qty 20

## 2017-08-16 MED ORDER — PROPOFOL 10 MG/ML IV BOLUS
INTRAVENOUS | Status: DC | PRN
  Administered 2017-08-16: 160 mg via INTRAVENOUS

## 2017-08-16 MED ORDER — ROPIVACAINE HCL 7.5 MG/ML IJ SOLN
INTRAMUSCULAR | Status: DC | PRN
  Administered 2017-08-16: 25 mL via PERINEURAL

## 2017-08-16 MED ORDER — TECHNETIUM TC 99M SULFUR COLLOID FILTERED
1.0000 | Freq: Once | INTRAVENOUS | Status: AC | PRN
  Administered 2017-08-16: 1 via INTRADERMAL

## 2017-08-16 SURGICAL SUPPLY — 53 items
APPLIER CLIP 9.375 MED OPEN (MISCELLANEOUS) ×3
APPLIER CLIP 9.375 SM OPEN (CLIP) ×3
BENZOIN TINCTURE PRP APPL 2/3 (GAUZE/BANDAGES/DRESSINGS) IMPLANT
BINDER BREAST LRG (GAUZE/BANDAGES/DRESSINGS) ×3 IMPLANT
BINDER BREAST XLRG (GAUZE/BANDAGES/DRESSINGS) IMPLANT
CANISTER SUCT 3000ML PPV (MISCELLANEOUS) ×3 IMPLANT
CHLORAPREP W/TINT 26ML (MISCELLANEOUS) ×3 IMPLANT
CLIP APPLIE 9.375 MED OPEN (MISCELLANEOUS) ×1 IMPLANT
CLIP APPLIE 9.375 SM OPEN (CLIP) ×1 IMPLANT
CLOSURE STERI-STRIP 1/2X4 (GAUZE/BANDAGES/DRESSINGS) ×1
CLOSURE WOUND 1/2 X4 (GAUZE/BANDAGES/DRESSINGS)
CLSR STERI-STRIP ANTIMIC 1/2X4 (GAUZE/BANDAGES/DRESSINGS) ×2 IMPLANT
COVER SURGICAL LIGHT HANDLE (MISCELLANEOUS) ×3 IMPLANT
DECANTER SPIKE VIAL GLASS SM (MISCELLANEOUS) ×3 IMPLANT
DERMABOND ADVANCED (GAUZE/BANDAGES/DRESSINGS) ×2
DERMABOND ADVANCED .7 DNX12 (GAUZE/BANDAGES/DRESSINGS) ×1 IMPLANT
DEVICE DUBIN SPECIMEN MAMMOGRA (MISCELLANEOUS) ×3 IMPLANT
DRAPE CHEST BREAST 15X10 FENES (DRAPES) ×3 IMPLANT
ELECT CAUTERY BLADE 6.4 (BLADE) ×3 IMPLANT
ELECT REM PT RETURN 9FT ADLT (ELECTROSURGICAL) ×3
ELECTRODE REM PT RTRN 9FT ADLT (ELECTROSURGICAL) ×1 IMPLANT
GAUZE SPONGE 4X4 12PLY STRL (GAUZE/BANDAGES/DRESSINGS) IMPLANT
GLOVE BIO SURGEON STRL SZ7 (GLOVE) ×3 IMPLANT
GLOVE BIOGEL PI IND STRL 7.5 (GLOVE) ×1 IMPLANT
GLOVE BIOGEL PI INDICATOR 7.5 (GLOVE) ×2
GOWN STRL REUS W/ TWL LRG LVL3 (GOWN DISPOSABLE) ×2 IMPLANT
GOWN STRL REUS W/TWL LRG LVL3 (GOWN DISPOSABLE) ×4
HEMOSTAT ARISTA ABSORB 3G PWDR (MISCELLANEOUS) ×3 IMPLANT
KIT BASIN OR (CUSTOM PROCEDURE TRAY) ×3 IMPLANT
KIT MARKER MARGIN INK (KITS) ×3 IMPLANT
KIT TURNOVER KIT B (KITS) ×3 IMPLANT
NEEDLE HYPO 25GX1X1/2 BEV (NEEDLE) ×3 IMPLANT
NS IRRIG 1000ML POUR BTL (IV SOLUTION) ×3 IMPLANT
PACK SURGICAL SETUP 50X90 (CUSTOM PROCEDURE TRAY) ×3 IMPLANT
PAD ARMBOARD 7.5X6 YLW CONV (MISCELLANEOUS) ×3 IMPLANT
PENCIL BUTTON HOLSTER BLD 10FT (ELECTRODE) ×3 IMPLANT
SPECIMEN JAR MEDIUM (MISCELLANEOUS) ×3 IMPLANT
SPONGE LAP 18X18 X RAY DECT (DISPOSABLE) ×3 IMPLANT
STAPLER VISISTAT 35W (STAPLE) ×3 IMPLANT
STOCKINETTE IMPERVIOUS 9X36 MD (GAUZE/BANDAGES/DRESSINGS) ×3 IMPLANT
STRIP CLOSURE SKIN 1/2X4 (GAUZE/BANDAGES/DRESSINGS) IMPLANT
SUT MNCRL AB 4-0 PS2 18 (SUTURE) ×6 IMPLANT
SUT SILK 2 0 SH (SUTURE) IMPLANT
SUT VIC AB 2-0 SH 27 (SUTURE) ×6
SUT VIC AB 2-0 SH 27XBRD (SUTURE) ×3 IMPLANT
SUT VIC AB 3-0 SH 27 (SUTURE) ×4
SUT VIC AB 3-0 SH 27XBRD (SUTURE) ×2 IMPLANT
SYR CONTROL 10ML LL (SYRINGE) ×3 IMPLANT
TOWEL OR 17X24 6PK STRL BLUE (TOWEL DISPOSABLE) ×3 IMPLANT
TOWEL OR 17X26 10 PK STRL BLUE (TOWEL DISPOSABLE) ×3 IMPLANT
TUBE CONNECTING 12'X1/4 (SUCTIONS) ×1
TUBE CONNECTING 12X1/4 (SUCTIONS) ×2 IMPLANT
YANKAUER SUCT BULB TIP NO VENT (SUCTIONS) ×3 IMPLANT

## 2017-08-16 NOTE — Discharge Instructions (Signed)
[facility] Office Phone Number [phone]  POST OP INSTRUCTIONS  Always review your discharge instruction sheet given to you by the facility where your surgery was performed.  IF YOU HAVE DISABILITY OR FAMILY LEAVE FORMS, YOU MUST BRING THEM TO THE OFFICE FOR PROCESSING.  DO NOT GIVE THEM TO YOUR DOCTOR.  Take either 1000 mg of tylenol every six hours or 400 mg of ibuprofen every 8 hours for the next 3 days. Use oxycodone as backup. Use ice tonight and daily for next 3 days.   1. A prescription for pain medication may be given to you upon discharge.  Take your pain medication as prescribed, if needed.  If narcotic pain medicine is not needed, then you may take acetaminophen (Tylenol), naprosyn (Alleve) or ibuprofen (Advil) as needed. 2. Take your usually prescribed medications unless otherwise directed 3. If you need a refill on your pain medication, please contact your pharmacy.  They will contact our office to request authorization.  Prescriptions will not be filled after 5pm or on week-ends. 4. You should eat very light the first 24 hours after surgery, such as soup, crackers, pudding, etc.  Resume your normal diet the day after surgery. 5. Most patients will experience some swelling and bruising in the breast.  Ice packs and a good support bra will help.  Wear the breast binder provided or a sports bra for 72 hours day and night.  After that wear a sports bra during the day until you return to the office. Swelling and bruising can take several days to resolve.  6. It is common to experience some constipation if taking pain medication after surgery.  Increasing fluid intake and taking a stool softener will usually help or prevent this problem from occurring.  A mild laxative (Milk of Magnesia or Miralax) should be taken according to package directions if there are no bowel movements after 48 hours. 7. Unless discharge instructions indicate otherwise, you may remove your  bandages 48 hours after surgery and you may shower at that time.  You may have steri-strips (small skin tapes) in place directly over the incision.  These strips should be left on the skin for 7-10 days and will come off on their own.  If your surgeon used skin glue on the incision, you may shower in 24 hours.  The glue will flake off over the next 2-3 weeks.  Any sutures or staples will be removed at the office during your follow-up visit. 8. ACTIVITIES:  You may resume regular daily activities (gradually increasing) beginning the next day.  Wearing a good support bra or sports bra minimizes pain and swelling.  You may have sexual intercourse when it is comfortable. a. You may drive when you no longer are taking prescription pain medication, you can comfortably wear a seatbelt, and you can safely maneuver your car and apply brakes. b. RETURN TO WORK:  ______________________________________________________________________________________ 9. You should see your doctor in the office for a follow-up appointment approximately two weeks after your surgery.  Your doctors nurse will typically make your follow-up appointment when she calls you with your pathology report.  Expect your pathology report 3-4 business days after your surgery.  You may call to check if you do not hear from [place] after three days. 10. OTHER INSTRUCTIONS: _______________________________________________________________________________________________ _____________________________________________________________________________________________________________________________________ _____________________________________________________________________________________________________________________________________ _____________________________________________________________________________________________________________________________________  WHEN TO CALL DR Lucillie Kiesel: 1. Fever over 101.0 2. Nausea and/or vomiting. 3. Extreme swelling  or bruising. 4. Continued bleeding from incision. 5. Increased pain, redness, or drainage  from the incision.  The clinic staff is available to answer your questions during regular business hours.  Please dont hesitate to call and ask to speak to one of the nurses for clinical concerns.  If you have a medical emergency, go to the nearest emergency room or call 911.  A surgeon from [facility] is always on call at the hospital.  For further questions, please visit [email or web address]

## 2017-08-16 NOTE — Anesthesia Postprocedure Evaluation (Signed)
Anesthesia Post Note  Patient: Jasmine Allen  Procedure(s) Performed: RIGHT BREAST LUMPECTOMY WITH AXILLARY SENTINEL LYMPH NODE BIOPSY (Right Breast)     Patient location during evaluation: PACU Anesthesia Type: General Level of consciousness: awake and alert Pain management: pain level controlled Vital Signs Assessment: post-procedure vital signs reviewed and stable Respiratory status: spontaneous breathing, nonlabored ventilation, respiratory function stable and patient connected to nasal cannula oxygen Cardiovascular status: blood pressure returned to baseline and stable Postop Assessment: no apparent nausea or vomiting Anesthetic complications: no    Last Vitals:  Vitals:   08/16/17 1100 08/16/17 1206  BP: 127/69 (!) 142/73  Pulse: 71   Resp: 10 12  Temp:  36.7 C  SpO2: 100% 100%    Last Pain:  Vitals:   08/16/17 1206  TempSrc: Oral  PainSc: 0-No pain                 Jabori Henegar

## 2017-08-16 NOTE — Anesthesia Preprocedure Evaluation (Addendum)
Anesthesia Evaluation  Patient identified by MRN, date of birth, ID band Patient awake    Reviewed: Allergy & Precautions, H&P , NPO status , Patient's Chart, lab work & pertinent test results, reviewed documented beta blocker date and time   Airway Mallampati: II  TM Distance: >3 FB Neck ROM: full    Dental no notable dental hx.    Pulmonary neg pulmonary ROS, former smoker,    Pulmonary exam normal breath sounds clear to auscultation       Cardiovascular Exercise Tolerance: Good hypertension, Pt. on medications  Rhythm:regular Rate:Normal     Neuro/Psych  Headaches, PSYCHIATRIC DISORDERS Anxiety Depression  Neuromuscular disease    GI/Hepatic Neg liver ROS, GERD  Medicated,  Endo/Other  negative endocrine ROS  Renal/GU negative Renal ROS  negative genitourinary   Musculoskeletal   Abdominal   Peds  Hematology negative hematology ROS (+)   Anesthesia Other Findings   Reproductive/Obstetrics negative OB ROS                             Anesthesia Physical Anesthesia Plan  ASA: II  Anesthesia Plan: General   Post-op Pain Management:  Regional for Post-op pain   Induction: Intravenous  PONV Risk Score and Plan: 3 and Ondansetron, Dexamethasone and Treatment may vary due to age or medical condition  Airway Management Planned: Oral ETT and LMA  Additional Equipment:   Intra-op Plan:   Post-operative Plan: Extubation in OR  Informed Consent: I have reviewed the patients History and Physical, chart, labs and discussed the procedure including the risks, benefits and alternatives for the proposed anesthesia with the patient or authorized representative who has indicated his/her understanding and acceptance.   Dental Advisory Given  Plan Discussed with: CRNA, Anesthesiologist and Surgeon  Anesthesia Plan Comments: (  )        Anesthesia Quick Evaluation

## 2017-08-16 NOTE — Anesthesia Procedure Notes (Signed)
Procedure Name: LMA Insertion Date/Time: 08/16/2017 9:26 AM Performed by: Gwyndolyn Saxon, CRNA Pre-anesthesia Checklist: Patient identified, Emergency Drugs available, Suction available, Patient being monitored and Timeout performed Patient Re-evaluated:Patient Re-evaluated prior to induction Oxygen Delivery Method: Circle system utilized Preoxygenation: Pre-oxygenation with 100% oxygen Induction Type: IV induction Ventilation: Mask ventilation without difficulty LMA: LMA inserted LMA Size: 4.0 Number of attempts: 1 Placement Confirmation: positive ETCO2,  CO2 detector and breath sounds checked- equal and bilateral Tube secured with: Tape Dental Injury: Teeth and Oropharynx as per pre-operative assessment

## 2017-08-16 NOTE — Interval H&P Note (Signed)
History and Physical Interval Note:  08/16/2017 9:04 AM  Jasmine Allen  has presented today for surgery, with the diagnosis of RIGHT BREAST CANCER  The various methods of treatment have been discussed with the patient and family. After consideration of risks, benefits and other options for treatment, the patient has consented to  Procedure(s): RIGHT BREAST LUMPECTOMY WITH AXILLARY SENTINEL LYMPH NODE BIOPSY (Right) as a surgical intervention .  The patient's history has been reviewed, patient examined, no change in status, stable for surgery.  I have reviewed the patient's chart and labs.  Questions were answered to the patient's satisfaction.     Rolm Bookbinder

## 2017-08-16 NOTE — Transfer of Care (Signed)
Immediate Anesthesia Transfer of Care Note  Patient: Jasmine Allen  Procedure(s) Performed: RIGHT BREAST LUMPECTOMY WITH AXILLARY SENTINEL LYMPH NODE BIOPSY (Right Breast)  Patient Location: PACU  Anesthesia Type:General  Level of Consciousness: awake and patient cooperative  Airway & Oxygen Therapy: Patient Spontanous Breathing and Patient connected to face mask oxygen  Post-op Assessment: Report given to RN and Post -op Vital signs reviewed and stable  Post vital signs: Reviewed and stable  Last Vitals:  Vitals Value Taken Time  BP 118/71 08/16/2017 10:24 AM  Temp    Pulse 73 08/16/2017 10:28 AM  Resp 14 08/16/2017 10:28 AM  SpO2 100 % 08/16/2017 10:28 AM  Vitals shown include unvalidated device data.  Last Pain:  Vitals:   08/16/17 0807  TempSrc: Oral  PainSc:       Patients Stated Pain Goal: 3 (67/12/45 8099)  Complications: No apparent anesthesia complications

## 2017-08-16 NOTE — H&P (Signed)
52 yof referred by Bing Matter for new right breast cancer. she has fh in mom at age 50, mgm, paternal cousin and a paternal aunt. apparently the cousin told everyone years ago they should get genetic testing. she had testing drawn a few weeks ago at gyn office that I have results from now and this is negative. she had nl mm last August which was normal on the left side. the right breaset had abnormality and this was further evalauted and negative. then noted a number of weeks ago a right breast mass. no dc. no prior breast history in patient. 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 he is now 52 and doing well. she has c density breasts. she has irregular mass near im fold of left breast. on [place] there is a 1.4x1.3x1.1 cm mass with posterior margin abutting muscle. there is no axillary adenopathy. core biopsy shows idc with dcis, has lvi, grade I, er/pr pos with ki of 40% and her 2 is negative. she is here with her husband to discuss options today   Past Surgical History Illene Regulus, CMA; 08/06/2017 4:20 PM) Breast Biopsy  Right. Knee Surgery  Left. Tonsillectomy   Diagnostic Studies History Illene Regulus, CMA; 08/06/2017 4:20 PM) Colonoscopy  1-5 years ago Mammogram  within last year  Allergies Sabino Gasser; 08/06/2017 10:20 AM) Sulfa Drugs  Erythromycin Derivatives  Reglan *GASTROINTESTINAL AGENTS - MISC.*  Allergies Reconciled   Medication History Sabino Gasser; 08/06/2017 10:21 AM) Hyoscyamine Sulfate (0.125MG  Tab Sublingual, Sublingual) Active. Estradiol (2MG  Tablet, Oral) Active. Progesterone Micronized (100MG  Capsule, Oral) Active. CloNIDine HCl (0.3MG  Tablet, Oral) Active. TraZODone HCl (100MG  Tablet, Oral) Active. Spironolactone (25MG  Tablet, Oral) Active. Cyclobenzaprine HCl (10MG  Tablet, Oral) Active. Pantoprazole Sodium (40MG  Tablet DR, Oral) Active. Medications Reconciled  Social History Illene Regulus, CMA; 08/06/2017 4:20 PM) Alcohol use  Occasional alcohol use. Caffeine use  Coffee. No drug use  Tobacco use  Former smoker.  Family History Illene Regulus, [place]; 08/06/2017 4:20 PM) Arthritis  Father. Breast Cancer  Mother. Diabetes Mellitus  Family Members In General. Heart disease in female family member before age 90  Hypertension  Father. Respiratory Condition  Father.  Pregnancy / Birth History Illene Regulus, CMA; 08/06/2017 4:20 PM) Age at menarche  53 years. Age of menopause  <45 Gravida  2 Length (months) of breastfeeding  7-12 Maternal age  58-35 Para  2  Other Problems Illene Regulus, CMA; 08/06/2017 4:20 PM) Anxiety Disorder  Bladder Problems  Breast Cancer  Gastroesophageal Reflux Disease  High blood pressure  Hypercholesterolemia  Kidney Stone  Lump In Breast  Migraine Headache    Review of Systems (Alisha Spillers CMA; 08/06/2017 4:20 PM) General Present- Chills and Fatigue. Not Present- Appetite Loss, Fever, Night Sweats, Weight Gain and Weight Loss. HEENT Present- Seasonal Allergies, Sinus Pain and Wears glasses/contact lenses. Not Present- Earache, Hearing Loss, Hoarseness, Nose Bleed, Oral Ulcers, Ringing in the Ears, Sore Throat, Visual Disturbances and Yellow Eyes. Breast Present- Breast Mass and Breast Pain. Not Present- Nipple Discharge and Skin Changes. Cardiovascular Present- Palpitations, Shortness of Breath and Swelling of Extremities. Not Present- Chest Pain, Difficulty Breathing Lying Down, Leg Cramps and Rapid Heart Rate. Gastrointestinal Present- Nausea. Not Present- Abdominal Pain, Bloating, Bloody Stool, Change in Bowel Habits, Chronic diarrhea, Constipation, Difficulty Swallowing, Excessive gas, Gets full quickly at meals, Hemorrhoids, Indigestion, Rectal Pain and Vomiting. Female Genitourinary Not Present- Frequency, Nocturia, Painful Urination, Pelvic Pain and Urgency. Musculoskeletal Present- Muscle  Pain.  Not Present- Back Pain, Joint Pain, Joint Stiffness, Muscle Weakness and Swelling of Extremities. Neurological Present- Headaches, Numbness and Tingling. Not Present- Decreased Memory, Fainting, Seizures, Tremor, Trouble walking and Weakness. Endocrine Present- Cold Intolerance. Not Present- Excessive Hunger, Hair Changes, Heat Intolerance, Hot flashes and New Diabetes. Hematology Not Present- Blood Thinners, Easy Bruising, Excessive bleeding, Gland problems, HIV and Persistent Infections.  Vitals Sabino Gasser; 08/06/2017 10:22 AM) 08/06/2017 10:22 AM Weight: 112.25 lb Height: 60in Body Surface Area: 1.46 m Body Mass Index: 21.92 kg/m  Temp.: 97.13F(Oral)  Pulse: 63 (Regular)  BP: 132/82 (Sitting, Left Arm, Standard) Physical Exam Rolm Bookbinder MD; 08/06/2017 1:36 PM) General Mental Status-Alert. Head and Neck Trachea-midline. Thyroid Gland Characteristics - normal size and consistency. Eye Sclera/Conjunctiva - Bilateral-No scleral icterus. Chest and Lung Exam Chest and lung exam reveals -quiet, even and easy respiratory effort with no use of accessory muscles and on auscultation, normal breath sounds, no adventitious sounds and normal vocal resonance. Breast Nipples-No Discharge. Note: 1.5 cm im fold mass at 6 oclock, ? fixed very superficial and not much breast tissue there Cardiovascular Cardiovascular examination reveals -normal heart sounds, regular rate and rhythm with no murmurs. Abdomen Note: soft nt Neurologic Neurologic evaluation reveals -alert and oriented x 3 with no impairment of recent or remote memory. Lymphatic Head & Neck General Head & Neck Lymphatics: Bilateral - Description - Normal. Axillary General Axillary Region: Bilateral - Description - Normal. Note: no [place] adenopathy   Assessment & Plan Rolm Bookbinder MD; 08/06/2017 8:10 PM) BREAST CANCER OF LOWER-INNER QUADRANT OF RIGHT FEMALE BREAST (C50.311) right breast  seed guided lumpectomy/sn biopsy followed by mammaprint per oncology We discussed the staging and pathophysiology of breast cancer. We discussed all of the different options for treatment for breast cancer including surgery, chemotherapy, radiation therapy, Herceptin, and antiestrogen therapy. We discussed a sentinel lymph node biopsy as she does not appear to having lymph node involvement right now. We discussed the performance of that with injection of radioactive tracer. . We discussed that there is a chance of having a positive node with a sentinel lymph node biopsy and we will await the permanent pathology to make any other first further decisions in terms of her treatment. One of these options might be to return to the operating room to perform an axillary lymph node dissection. We discussed up to a 5% risk lifetime of chronic shoulder pain as well as lymphedema associated with a sentinel lymph node biopsy. We discussed the options for treatment of the breast cancer which included lumpectomy versus a mastectomy. We discussed the performance of the lumpectomy with radioactive seed placement. We discussed a 5-10% chance of a positive margin requiring reexcision in the operating room. We also discussed that she will need radiation therapy if she undergoes lumpectomy. We discussed the mastectomy and the postoperative care for that as well. Mastectomy can be followed by reconstruction. This is a more extensive surgery and requires more recovery. The decision for lumpectomy vs mastectomy has no impact on decision for chemotherapy. Most mastectomy patients will not need radiation therapy. We discussed that there is no difference in her survival whether she undergoes lumpectomy with radiation therapy or antiestrogen therapy versus a mastectomy. There is also no real difference between her recurrence in the breast.

## 2017-08-16 NOTE — Progress Notes (Signed)
Allergy to sulfa noted Celebrex ordered Dr Donne Hazel called and D/C celebrex

## 2017-08-16 NOTE — Anesthesia Procedure Notes (Addendum)
Anesthesia Regional Block: Pectoralis block   Pre-Anesthetic Checklist: ,, timeout performed, Correct Patient, Correct Site, Correct Laterality, Correct Procedure, Correct Position, site marked, Risks and benefits discussed,  Surgical consent,  Pre-op evaluation,  At surgeon's request and post-op pain management  Laterality: Right  Prep: chloraprep       Needles:  Injection technique: Single-shot  Needle Type: Echogenic Stimulator Needle     Needle Length: 5cm  Needle Gauge: 22     Additional Needles:   Procedures:, nerve stimulator,,, ultrasound used (permanent image in chart),,,,  Narrative:  Start time: 08/16/2017 8:50 AM End time: 08/16/2017 8:55 AM Injection made incrementally with aspirations every 5 mL.  Performed by: Personally  Anesthesiologist: Janeece Riggers, MD  Additional Notes: Functioning IV was confirmed and monitors were applied.  A 85mm 22ga Arrow echogenic stimulator needle was used. Sterile prep and drape,hand hygiene and sterile gloves were used. Ultrasound guidance: relevant anatomy identified, needle position confirmed, local anesthetic spread visualized around nerve(s)., vascular puncture avoided.  Image printed for medical record. Negative aspiration and negative test dose prior to incremental administration of local anesthetic. The patient tolerated the procedure well.

## 2017-08-16 NOTE — H&P (View-Only) (Signed)
52 yof referred by Bing Matter for new right breast cancer. she has fh in mom at age 50, mgm, paternal cousin and a paternal aunt. apparently the cousin told everyone years ago they should get genetic testing. she had testing drawn a few weeks ago at gyn office that I have results from now and this is negative. she had nl mm last August which was normal on the left side. the right breaset had abnormality and this was further evalauted and negative. then noted a number of weeks ago a right breast mass. no dc. no prior breast history in patient. 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 he is now 61 and doing well. she has c density breasts. she has irregular mass near im fold of left breast. on [place] there is a 1.4x1.3x1.1 cm mass with posterior margin abutting muscle. there is no axillary adenopathy. core biopsy shows idc with dcis, has lvi, grade I, er/pr pos with ki of 40% and her 2 is negative. she is here with her husband to discuss options today   Past Surgical History Jasmine Allen, CMA; 08/06/2017 4:20 PM) Breast Biopsy  Right. Knee Surgery  Left. Tonsillectomy   Diagnostic Studies History Jasmine Allen, CMA; 08/06/2017 4:20 PM) Colonoscopy  1-5 years ago Mammogram  within last year  Allergies Sabino Gasser; 08/06/2017 10:20 AM) Sulfa Drugs  Erythromycin Derivatives  Reglan *GASTROINTESTINAL AGENTS - MISC.*  Allergies Reconciled   Medication History Sabino Gasser; 08/06/2017 10:21 AM) Hyoscyamine Sulfate (0.125MG  Tab Sublingual, Sublingual) Active. Estradiol (2MG  Tablet, Oral) Active. Progesterone Micronized (100MG  Capsule, Oral) Active. CloNIDine HCl (0.3MG  Tablet, Oral) Active. TraZODone HCl (100MG  Tablet, Oral) Active. Spironolactone (25MG  Tablet, Oral) Active. Cyclobenzaprine HCl (10MG  Tablet, Oral) Active. Pantoprazole Sodium (40MG  Tablet DR, Oral) Active. Medications Reconciled  Social History Jasmine Allen, CMA; 08/06/2017 4:20 PM) Alcohol use  Occasional alcohol use. Caffeine use  Coffee. No drug use  Tobacco use  Former smoker.  Family History Jasmine Allen, [place]; 08/06/2017 4:20 PM) Arthritis  Father. Breast Cancer  Mother. Diabetes Mellitus  Family Members In General. Heart disease in female family member before age 19  Hypertension  Father. Respiratory Condition  Father.  Pregnancy / Birth History Jasmine Allen, CMA; 08/06/2017 4:20 PM) Age at menarche  59 years. Age of menopause  <45 Gravida  2 Length (months) of breastfeeding  7-12 Maternal age  79-35 Para  2  Other Problems Jasmine Allen, CMA; 08/06/2017 4:20 PM) Anxiety Disorder  Bladder Problems  Breast Cancer  Gastroesophageal Reflux Disease  High blood pressure  Hypercholesterolemia  Kidney Stone  Lump In Breast  Migraine Headache    Review of Systems (Alisha Spillers CMA; 08/06/2017 4:20 PM) General Present- Chills and Fatigue. Not Present- Appetite Loss, Fever, Night Sweats, Weight Gain and Weight Loss. HEENT Present- Seasonal Allergies, Sinus Pain and Wears glasses/contact lenses. Not Present- Earache, Hearing Loss, Hoarseness, Nose Bleed, Oral Ulcers, Ringing in the Ears, Sore Throat, Visual Disturbances and Yellow Eyes. Breast Present- Breast Mass and Breast Pain. Not Present- Nipple Discharge and Skin Changes. Cardiovascular Present- Palpitations, Shortness of Breath and Swelling of Extremities. Not Present- Chest Pain, Difficulty Breathing Lying Down, Leg Cramps and Rapid Heart Rate. Gastrointestinal Present- Nausea. Not Present- Abdominal Pain, Bloating, Bloody Stool, Change in Bowel Habits, Chronic diarrhea, Constipation, Difficulty Swallowing, Excessive gas, Gets full quickly at meals, Hemorrhoids, Indigestion, Rectal Pain and Vomiting. Female Genitourinary Not Present- Frequency, Nocturia, Painful Urination, Pelvic Pain and Urgency. Musculoskeletal Present- Muscle  Pain.  Not Present- Back Pain, Joint Pain, Joint Stiffness, Muscle Weakness and Swelling of Extremities. Neurological Present- Headaches, Numbness and Tingling. Not Present- Decreased Memory, Fainting, Seizures, Tremor, Trouble walking and Weakness. Endocrine Present- Cold Intolerance. Not Present- Excessive Hunger, Hair Changes, Heat Intolerance, Hot flashes and New Diabetes. Hematology Not Present- Blood Thinners, Easy Bruising, Excessive bleeding, Gland problems, HIV and Persistent Infections.  Vitals Sabino Gasser; 08/06/2017 10:22 AM) 08/06/2017 10:22 AM Weight: 112.25 lb Height: 60in Body Surface Area: 1.46 m Body Mass Index: 21.92 kg/m  Temp.: 97.21F(Oral)  Pulse: 63 (Regular)  BP: 132/82 (Sitting, Left Arm, Standard) Physical Exam Rolm Bookbinder MD; 08/06/2017 1:36 PM) General Mental Status-Alert. Head and Neck Trachea-midline. Thyroid Gland Characteristics - normal size and consistency. Eye Sclera/Conjunctiva - Bilateral-No scleral icterus. Chest and Lung Exam Chest and lung exam reveals -quiet, even and easy respiratory effort with no use of accessory muscles and on auscultation, normal breath sounds, no adventitious sounds and normal vocal resonance. Breast Nipples-No Discharge. Note: 1.5 cm im fold mass at 6 oclock, ? fixed very superficial and not much breast tissue there Cardiovascular Cardiovascular examination reveals -normal heart sounds, regular rate and rhythm with no murmurs. Abdomen Note: soft nt Neurologic Neurologic evaluation reveals -alert and oriented x 3 with no impairment of recent or remote memory. Lymphatic Head & Neck General Head & Neck Lymphatics: Bilateral - Description - Normal. Axillary General Axillary Region: Bilateral - Description - Normal. Note: no [place] adenopathy   Assessment & Plan Rolm Bookbinder MD; 08/06/2017 8:10 PM) BREAST CANCER OF LOWER-INNER QUADRANT OF RIGHT FEMALE BREAST (C50.311) right breast  seed guided lumpectomy/sn biopsy followed by mammaprint per oncology We discussed the staging and pathophysiology of breast cancer. We discussed all of the different options for treatment for breast cancer including surgery, chemotherapy, radiation therapy, Herceptin, and antiestrogen therapy. We discussed a sentinel lymph node biopsy as she does not appear to having lymph node involvement right now. We discussed the performance of that with injection of radioactive tracer. . We discussed that there is a chance of having a positive node with a sentinel lymph node biopsy and we will await the permanent pathology to make any other first further decisions in terms of her treatment. One of these options might be to return to the operating room to perform an axillary lymph node dissection. We discussed up to a 5% risk lifetime of chronic shoulder pain as well as lymphedema associated with a sentinel lymph node biopsy. We discussed the options for treatment of the breast cancer which included lumpectomy versus a mastectomy. We discussed the performance of the lumpectomy with radioactive seed placement. We discussed a 5-10% chance of a positive margin requiring reexcision in the operating room. We also discussed that she will need radiation therapy if she undergoes lumpectomy. We discussed the mastectomy and the postoperative care for that as well. Mastectomy can be followed by reconstruction. This is a more extensive surgery and requires more recovery. The decision for lumpectomy vs mastectomy has no impact on decision for chemotherapy. Most mastectomy patients will not need radiation therapy. We discussed that there is no difference in her survival whether she undergoes lumpectomy with radiation therapy or antiestrogen therapy versus a mastectomy. There is also no real difference between her recurrence in the breast.

## 2017-08-16 NOTE — Op Note (Signed)
Preoperative diagnosis: Clinical stage I right breast cancer Postoperative diagnosis: Same as above Procedure: Right breast lumpectomy Right deep axillary sentinel lymph node biopsy Surgeon: Dr. Serita Grammes Anesthesia: General with pectoral block Estimated blood loss: Minimal Specimens: 1.  Right breast lumpectomy marked with paint 2.  Right axillary sentinel lymph nodes with highest count of 654 Complications: None Drains: None Sponge and count was correct at completion Deposition recovery stable condition  Indications: This a 52 year old female with negative genetic testing who presents with a palpable right breast mass.  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  We discussed all of the options and elected to proceed with lumpectomy and sentinel lymph node biopsy.  Procedure: After informed consent was obtained the patient first underwent a pectoral block.  She had technetium administered in the standard periareolar fashion.  She had SCDs in place.  Antibiotics were given.  She was then placed under general anesthesia without complication.  She was prepped and draped in the standard sterile surgical fashion.  A surgical timeout was then performed.  I used the ultrasound to identify the inframammary mass.  I then made an inframammary incision after infiltrating with Marc[place].  I then proceeded to remove the mass as well as an attempt to get a clear margin around it.  The posterior margin is the pectoralis muscle as I excised the fascia with this.  This was passed off the table and a mammogram was completed.  The clip was then the specimen.  I then had pathology look at this grossly and it appears all my margins were clear grossly.  I placed some clips in the cavity.  I obtained hemostasis.  I then closed the breast tissue with 2-0 Vicryl.  I did release some of the anterior tissue to get this to pull down to the inframammary fold.  I then closed with 3-0  Vicryl, 4-0 Monocryl, and glue.  I made a incision below the axillary hairline.  I carried this to the axillary fascia.  I used the neoprobe to identify what appeared to be several small sentinel lymph nodes.  These were excised and passed off the table.  The highest count is as listed above.  There was really no background radioactivity and no palpable nodes.  I then obtained hemostasis.  I closed this with 2-0 Vicryl, 3-0 Vicryl, 4-0 Monocryl, and glue.  She was extubated and transferred to recovery stable.

## 2017-08-17 ENCOUNTER — Encounter (HOSPITAL_COMMUNITY): Payer: Self-pay | Admitting: General Surgery

## 2017-08-20 ENCOUNTER — Other Ambulatory Visit: Payer: Self-pay | Admitting: General Surgery

## 2017-08-23 ENCOUNTER — Inpatient Hospital Stay: Payer: 59 | Attending: Hematology and Oncology | Admitting: Hematology and Oncology

## 2017-08-23 ENCOUNTER — Encounter: Payer: Self-pay | Admitting: *Deleted

## 2017-08-23 ENCOUNTER — Other Ambulatory Visit: Payer: Self-pay | Admitting: General Surgery

## 2017-08-23 DIAGNOSIS — C50311 Malignant neoplasm of lower-inner quadrant of right female breast: Secondary | ICD-10-CM | POA: Diagnosis not present

## 2017-08-23 DIAGNOSIS — Z87891 Personal history of nicotine dependence: Secondary | ICD-10-CM | POA: Diagnosis not present

## 2017-08-23 DIAGNOSIS — Z170 Estrogen receptor positive status [ER+]: Secondary | ICD-10-CM | POA: Diagnosis not present

## 2017-08-23 DIAGNOSIS — Z803 Family history of malignant neoplasm of breast: Secondary | ICD-10-CM | POA: Insufficient documentation

## 2017-08-23 NOTE — Progress Notes (Signed)
[name] NOTE  Patient Care Team: Aletha Halim., PA-C as PCP - General (Family Medicine)  CHIEF COMPLAINTS/PURPOSE OF CONSULTATION:  Newly diagnosed breast cancer  HISTORY OF PRESENTING ILLNESS:  Jasmine Allen 52 y.o. female is here because of recent diagnosis of right breast cancer.  Patient had a mammogram in the right breast that revealed a 1.4 cm lesion at 5 o'clock position.  The posterior margin of the mass appeared to abut the pectoralis muscle.  Ultrasound of the axilla was negative.  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  She was seen by surgery and she underwent left lumpectomy on 08/16/2017.  Final pathology revealed 1.5 cm grade 2 invasive ductal carcinoma 3- lymph nodes and she was referred to [place] for discussion regarding adjuvant treatment options.  I reviewed her records extensively and collaborated the history with the patient.  SUMMARY OF ONCOLOGIC HISTORY:   Malignant neoplasm of lower-inner quadrant of right breast of female, estrogen receptor positive ([place])   08/16/2017 Initial Diagnosis    Right lumpectomy: Grade 2 IDC 1.5 cm, with DCIS and necrosis, 0/3 lymph nodes negative, ER 95%, PR 30%, HER-2 negative ratio 1.14, Ki-67 40%, T1CN0 stage Ia      MEDICAL HISTORY:  Past Medical History:  Diagnosis Date  . Alopecia, unspecified   . Anemia, unspecified    during her pregnancy  . Anxiety state, unspecified   . Bulging disc   . Depressive disorder, not elsewhere classified   . Diverticulosis of colon (without mention of hemorrhage)    CT Scan  . Dysrhythmia    PVCs in the past  . Esophageal reflux   . Esophagitis 1998  . Hiatal hernia 1998  . History of kidney stones   . Hypercholesteremia   . Hypertension   . Migraine   . Opioid abuse, in remission [facility])     SURGICAL HISTORY: Past Surgical History:   Procedure Laterality Date  . ABLATION    . BREAST LUMPECTOMY WITH AXILLARY LYMPH NODE BIOPSY Right 08/16/2017   Procedure: RIGHT BREAST LUMPECTOMY WITH AXILLARY SENTINEL LYMPH NODE BIOPSY;  Surgeon: Rolm Bookbinder, MD;  Location: [facility];  Service: General;  Laterality: Right;  . CARDIAC CATHETERIZATION  2011   normal coronaries  . KNEE SURGERY    . TONSILLECTOMY      SOCIAL HISTORY: Social History   Socioeconomic History  . Marital status: Married    Spouse name: Not on file  . Number of children: Not on file  . Years of education: Not on file  . Highest education level: Not on file  Occupational History  . Not on file  Social Needs  . Financial resource strain: Not on file  . Food insecurity:    Worry: Not on file    Inability: Not on file  . Transportation needs:    Medical: Not on file    Non-medical: Not on file  Tobacco Use  . Smoking status: Former Smoker    Packs/day: 0.25    Last attempt to quit: 08/25/2016    Years since quitting: 0.9  . Smokeless tobacco: Never Used  Substance and Sexual Activity  . Alcohol use: Yes    Comment: "once a week"  . Drug use: No  . Sexual activity: Yes    Birth control/protection: None, Post-menopausal  Lifestyle  . Physical activity:    Days per week:  Not on file    Minutes per session: Not on file  . Stress: Not on file  Relationships  . Social connections:    Talks on phone: Not on file    Gets together: Not on file    Attends religious service: Not on file    Active member of club or organization: Not on file    Attends meetings of clubs or organizations: Not on file    Relationship status: Not on file  . Intimate partner violence:    Fear of current or ex partner: Not on file    Emotionally abused: Not on file    Physically abused: Not on file    Forced sexual activity: Not on file  Other Topics Concern  . Not on file  Social History Narrative  . Not on file    FAMILY HISTORY: Family History  Problem  Relation Age of Onset  . Heart disease Father        CABG  . Breast cancer Mother   . Breast cancer Maternal Grandmother   . Breast cancer Cousin   . Colon cancer Neg Hx   . Stomach cancer Neg Hx     ALLERGIES:  is allergic to amoxicillin-pot clavulanate; erythromycin; metoclopramide hcl; and sulfonamide derivatives.  MEDICATIONS:  Current Outpatient Medications  Medication Sig Dispense Refill  . ACETAMINOPHEN-BUTALBITAL 50-325 MG TABS Take 1 tablet by mouth daily as needed (for headache).   0  . ALPRAZolam (XANAX) 0.5 MG tablet Take 0.5 mg by mouth at bedtime as needed for anxiety or sleep.     . Biotin 10000 MCG TABS Take 10,000 mcg by mouth daily.     . cetirizine (ZYRTEC) 10 MG tablet Take 10 mg by mouth every morning.     . cloNIDine (CATAPRES) 0.3 MG tablet Take 0.3 mg by mouth at bedtime.     . cyclobenzaprine (FLEXERIL) 10 MG tablet Take 10 mg by mouth every 8 (eight) hours as needed for muscle spasms.  1  . furosemide (LASIX) 20 MG tablet Take 20 mg by mouth daily as needed for fluid.  5  . hydrocortisone valerate cream (WESTCORT) 0.2 % Apply 1 application topically daily as needed (eczema).     . hydrOXYzine (ATARAX/VISTARIL) 10 MG tablet Take 10-30 mg by mouth at bedtime as needed for itching.   0  . hyoscyamine (LEVSIN SL) 0.125 MG SL tablet dissolve 1 tablet under the tongue every 4 hours if needed (Patient taking differently: dissolve 1 tablet under the tongue every 4 hours if needed for stomach) 30 tablet 0  . ibuprofen (ADVIL,MOTRIN) 200 MG tablet Take 400-800 mg by mouth every 6 (six) hours as needed for fever, headache or moderate pain.    . Melatonin 5 MG CAPS Take 5 mg by mouth at bedtime as needed (for sleep).     . Multiple Vitamin (MULITIVITAMIN WITH MINERALS) TABS Take 1 tablet by mouth daily.    . naproxen (NAPROSYN) 500 MG tablet Take 1 tablet (500 mg total) by mouth 2 (two) times daily with a meal. (Patient not taking: Reported on 08/10/2017) 30 tablet 0  .  ondansetron (ZOFRAN ODT) 4 MG disintegrating tablet Take 1 tablet (4 mg total) by mouth every 8 (eight) hours as needed for nausea. (Patient not taking: Reported on 08/10/2017) 10 tablet 0  . oxyCODONE (OXY IR/ROXICODONE) 5 MG immediate release tablet Take 1 tablet (5 mg total) by mouth every 6 (six) hours as needed for moderate pain, severe pain or breakthrough  pain. 10 tablet 0  . oxyCODONE-acetaminophen (PERCOCET) 5-325 MG tablet Take 1 tablet by mouth every 4 (four) hours as needed. (Patient not taking: Reported on 08/10/2017) 20 tablet 0  . pantoprazole (PROTONIX) 40 MG tablet Take 1 tablet (40 mg total) by mouth 2 (two) times daily before a meal. (Patient taking differently: Take 40 mg by mouth daily. ) 60 tablet 6  . spironolactone (ALDACTONE) 25 MG tablet Take 25 mg by mouth daily.    . tamsulosin (FLOMAX) 0.4 MG CAPS capsule Take 1 capsule (0.4 mg total) by mouth 2 (two) times daily. (Patient not taking: Reported on 08/10/2017) 10 capsule 0  . traZODone (DESYREL) 100 MG tablet Take 100 mg by mouth at bedtime.     . valACYclovir (VALTREX) 1000 MG tablet Take 1,000 mg by mouth 2 (two) times daily as needed (for out breaks).      No current facility-administered medications for this visit.     REVIEW OF SYSTEMS:   Constitutional: Denies fevers, chills or abnormal night sweats Eyes: Denies blurriness of vision, double vision or watery eyes Ears, nose, mouth, throat, and face: Denies mucositis or sore throat Respiratory: Denies cough, dyspnea or wheezes Cardiovascular: Denies palpitation, chest discomfort or lower extremity swelling Gastrointestinal:  Denies nausea, heartburn or change in bowel habits Skin: Denies abnormal skin rashes Lymphatics: Denies new lymphadenopathy or easy bruising Neurological:Denies numbness, tingling or new weaknesses Behavioral/Psych: Mood is stable, no new changes  Breast: Recent left lumpectomy All other systems were reviewed with the patient and are  negative.  PHYSICAL EXAMINATION: ECOG PERFORMANCE STATUS: 1 - Symptomatic but completely ambulatory  Vitals:   08/23/17 1542  BP: 133/75  Pulse: 68  Resp: 17  Temp: 98 F (36.7 C)  SpO2: 100%   Filed Weights   08/23/17 1542  Weight: 113 lb 3.2 oz (51.3 kg)    GENERAL:alert, no distress and comfortable SKIN: skin color, texture, turgor are normal, no rashes or significant lesions EYES: normal, conjunctiva are pink and non-injected, sclera clear OROPHARYNX:no exudate, no erythema and lips, buccal mucosa, and tongue normal  NECK: supple, thyroid normal size, non-tender, without nodularity LYMPH:  no palpable lymphadenopathy in the cervical, axillary or inguinal LUNGS: clear to auscultation and percussion with normal breathing effort HEART: regular rate & rhythm and no murmurs and no lower extremity edema ABDOMEN:abdomen soft, non-tender and normal bowel sounds Musculoskeletal:no cyanosis of digits and no clubbing  PSYCH: alert & oriented x 3 with fluent speech NEURO: no focal motor/sensory deficits   LABORATORY DATA:  I have reviewed the data as listed Lab Results  Component Value Date   WBC 7.2 08/14/2017   HGB 14.9 08/14/2017   HCT 43.3 08/14/2017   MCV 90.2 08/14/2017   PLT 249 08/14/2017   Lab Results  Component Value Date   NA 139 08/14/2017   K 4.3 08/14/2017   CL 104 08/14/2017   CO2 28 08/14/2017    RADIOGRAPHIC STUDIES: I have personally reviewed the radiological reports and agreed with the findings in the report.  ASSESSMENT AND PLAN:  Malignant neoplasm of lower-inner quadrant of right breast of female, estrogen receptor positive ([place]) 08/16/2017: Right lumpectomy: Grade 2 IDC 1.5 cm, with DCIS and necrosis, 0/3 lymph nodes negative, ER 95%, PR 30%, HER-2 negative ratio 1.14, Ki-67 40%, T1CN0 stage Ia  Pathology counseling: I discussed the final pathology report of the patient provided  a copy of this report. I discussed the margins as well as lymph node  surgeries. We also  discussed the final staging along with previously performed ER/PR and HER-2/neu testing.  Recommendation: Re-resection of the margins 1.  Oncotype DX testing to determine if she would benefit from systemic chemotherapy 2. adjuvant radiation therapy 3 followed by adjuvant antiestrogen therapy.   Oncotype counseling: I discussed Oncotype DX test. I explained to the patient that this is a 21 gene panel to evaluate patient tumors DNA to calculate recurrence score. This would help determine whether patient has high risk or intermediate risk or low risk breast cancer. She understands that if her tumor was found to be high risk, she would benefit from systemic chemotherapy. If low risk, no need of chemotherapy. If she was found to be intermediate risk, we would need to evaluate the score as well as other risk factors and determine if an abbreviated chemotherapy may be of benefit.  Return to clinic based upon Oncotype DX score    All questions were answered. The patient knows to call the clinic with any problems, questions or concerns.    Harriette Ohara, MD 08/23/17

## 2017-08-23 NOTE — Assessment & Plan Note (Signed)
08/16/2017: Right lumpectomy: Grade 2 IDC 1.5 cm, with DCIS and necrosis, 0/3 lymph nodes negative, ER 95%, PR 30%, HER-2 negative ratio 1.14, Ki-67 40%, T1CN0 stage Ia  Pathology counseling: I discussed the final pathology report of the patient provided  a copy of this report. I discussed the margins as well as lymph node surgeries. We also discussed the final staging along with previously performed ER/PR and HER-2/neu testing.  Recommendation: 1.  Oncotype DX testing to determine if she would benefit from systemic chemotherapy 2. adjuvant radiation therapy 3 followed by adjuvant antiestrogen therapy.   Return to clinic based upon Oncotype DX score

## 2017-08-24 ENCOUNTER — Encounter: Payer: Self-pay | Admitting: *Deleted

## 2017-08-24 ENCOUNTER — Telehealth: Payer: Self-pay | Admitting: *Deleted

## 2017-08-24 NOTE — Telephone Encounter (Signed)
Received order for oncotype testing. Requisition faxed to pathology. Received by Keisha 

## 2017-09-04 ENCOUNTER — Telehealth: Payer: Self-pay | Admitting: *Deleted

## 2017-09-04 NOTE — Telephone Encounter (Signed)
Received oncotype score of 31/19%. Physician team notified. Called pt with results. Scheduled and confirmed appt with Dr. Lindi Adie on 09/05/17 at 1pm to discuss the results and further recommendations/treatment options.

## 2017-09-05 ENCOUNTER — Other Ambulatory Visit: Payer: Self-pay

## 2017-09-05 ENCOUNTER — Inpatient Hospital Stay (HOSPITAL_BASED_OUTPATIENT_CLINIC_OR_DEPARTMENT_OTHER): Payer: 59 | Admitting: Hematology and Oncology

## 2017-09-05 ENCOUNTER — Telehealth: Payer: Self-pay | Admitting: Hematology and Oncology

## 2017-09-05 ENCOUNTER — Ambulatory Visit: Payer: 59

## 2017-09-05 ENCOUNTER — Encounter: Payer: Self-pay | Admitting: *Deleted

## 2017-09-05 ENCOUNTER — Encounter (HOSPITAL_BASED_OUTPATIENT_CLINIC_OR_DEPARTMENT_OTHER): Payer: Self-pay | Admitting: *Deleted

## 2017-09-05 ENCOUNTER — Ambulatory Visit
Admission: RE | Admit: 2017-09-05 | Discharge: 2017-09-05 | Disposition: A | Discharge status: Home / Self Care | Payer: 59 | Source: Ambulatory Visit | Attending: Radiation Oncology | Admitting: Radiation Oncology

## 2017-09-05 DIAGNOSIS — C50311 Malignant neoplasm of lower-inner quadrant of right female breast: Secondary | ICD-10-CM

## 2017-09-05 DIAGNOSIS — Z170 Estrogen receptor positive status [ER+]: Secondary | ICD-10-CM

## 2017-09-05 NOTE — Telephone Encounter (Signed)
Gave patient AVs and calendar of upcoming June through august appointments.

## 2017-09-05 NOTE — Progress Notes (Signed)
Patient Care Team: [name] as PCP - General (Family Medicine)  DIAGNOSIS:  Encounter Diagnosis  Name Primary?  . Malignant neoplasm of lower-inner quadrant of right breast of female, estrogen receptor positive ([place])     SUMMARY OF ONCOLOGIC HISTORY:   Malignant neoplasm of lower-inner quadrant of right breast of female, estrogen receptor positive ([place])   08/16/2017 Initial Diagnosis    Right lumpectomy: Grade 2 IDC 1.5 cm, with DCIS and necrosis, 0/3 lymph nodes negative, ER 95%, PR 30%, HER-2 negative ratio 1.14, Ki-67 40%, T1CN0 stage Ia      08/16/2017 Oncotype testing    Oncotype DX recurrence score 31: 19% risk of recurrence at 9 years.  High risk       CHIEF COMPLIANT: Follow-up to discuss Oncotype test results  INTERVAL HISTORY: Jasmine Allen is a 52 year old with above-mentioned history of right breast cancer treated with lumpectomy and had Oncotype DX testing and is here today to discuss the results of the test.  She has healed very well from the previous lumpectomy.  She is currently to be scheduled for resection of the margins.  She will meet with Dr. Donne Hazel.  She will also need a port placement at the same time for chemo.  Oncotype DX score came back as high risk at 31 which translates into the 19% risk of distant recurrence at 9 years.  She is here to discuss chemotherapy options.  REVIEW OF SYSTEMS:   Constitutional: Denies fevers, chills or abnormal weight loss Eyes: Denies blurriness of vision Ears, nose, mouth, throat, and face: Denies mucositis or sore throat Respiratory: Denies cough, dyspnea or wheezes Cardiovascular: Denies palpitation, chest discomfort Gastrointestinal:  Denies nausea, heartburn or change in bowel habits Skin: Denies abnormal skin rashes Lymphatics: Denies new lymphadenopathy or easy bruising Neurological:Denies numbness, tingling or new weaknesses Behavioral/Psych: Mood is stable, no new changes  Extremities: No lower  extremity edema  All other systems were reviewed with the patient and are negative.  I have reviewed the past medical history, past surgical history, social history and family history with the patient and they are unchanged from previous note.  ALLERGIES:  is allergic to amoxicillin-pot clavulanate; erythromycin; metoclopramide hcl; and sulfonamide derivatives.  MEDICATIONS:  Current Outpatient Medications  Medication Sig Dispense Refill  . ACETAMINOPHEN-BUTALBITAL 50-325 MG TABS Take 1 tablet by mouth daily as needed (for headache).   0  . ALPRAZolam (XANAX) 0.5 MG tablet Take 0.5 mg by mouth at bedtime as needed for anxiety or sleep.     . Biotin 10000 MCG TABS Take 10,000 mcg by mouth daily.     . cetirizine (ZYRTEC) 10 MG tablet Take 10 mg by mouth every morning.     . cloNIDine (CATAPRES) 0.3 MG tablet Take 0.3 mg by mouth at bedtime.     . cyclobenzaprine (FLEXERIL) 10 MG tablet Take 10 mg by mouth every 8 (eight) hours as needed for muscle spasms.  1  . furosemide (LASIX) 20 MG tablet Take 20 mg by mouth daily as needed for fluid.  5  . hydrocortisone valerate cream (WESTCORT) 0.2 % Apply 1 application topically daily as needed (eczema).     . hydrOXYzine (ATARAX/VISTARIL) 10 MG tablet Take 10-30 mg by mouth at bedtime as needed for itching.   0  . hyoscyamine (LEVSIN SL) 0.125 MG SL tablet dissolve 1 tablet under the tongue every 4 hours if needed (Patient taking differently: dissolve 1 tablet under the tongue every 4 hours if needed  for stomach) 30 tablet 0  . ibuprofen (ADVIL,MOTRIN) 200 MG tablet Take 400-800 mg by mouth every 6 (six) hours as needed for fever, headache or moderate pain.    . Melatonin 5 MG CAPS Take 5 mg by mouth at bedtime as needed (for sleep).     . Multiple Vitamin (MULITIVITAMIN WITH MINERALS) TABS Take 1 tablet by mouth daily.    Marland Kitchen oxyCODONE (OXY IR/ROXICODONE) 5 MG immediate release tablet Take 1 tablet (5 mg total) by mouth every 6 (six) hours as needed  for moderate pain, severe pain or breakthrough pain. 10 tablet 0  . pantoprazole (PROTONIX) 40 MG tablet Take 1 tablet (40 mg total) by mouth 2 (two) times daily before a meal. (Patient taking differently: Take 40 mg by mouth daily. ) 60 tablet 6  . spironolactone (ALDACTONE) 25 MG tablet Take 25 mg by mouth daily.    . traZODone (DESYREL) 100 MG tablet Take 100 mg by mouth at bedtime.     . valACYclovir (VALTREX) 1000 MG tablet Take 1,000 mg by mouth 2 (two) times daily as needed (for out breaks).      No current facility-administered medications for this visit.     PHYSICAL EXAMINATION: ECOG PERFORMANCE STATUS: 1 - Symptomatic but completely ambulatory  Vitals:   09/05/17 1314  BP: 135/72  Pulse: 72  Resp: 18  Temp: 97.9 F (36.6 C)  SpO2: 100%   Filed Weights   09/05/17 1314  Weight: 113 lb 9.3 oz (51.5 kg)    GENERAL:alert, no distress and comfortable SKIN: skin color, texture, turgor are normal, no rashes or significant lesions EYES: normal, Conjunctiva are pink and non-injected, sclera clear OROPHARYNX:no exudate, no erythema and lips, buccal mucosa, and tongue normal  NECK: supple, thyroid normal size, non-tender, without nodularity LYMPH:  no palpable lymphadenopathy in the cervical, axillary or inguinal LUNGS: clear to auscultation and percussion with normal breathing effort HEART: regular rate & rhythm and no murmurs and no lower extremity edema ABDOMEN:abdomen soft, non-tender and normal bowel sounds MUSCULOSKELETAL:no cyanosis of digits and no clubbing  NEURO: alert & oriented x 3 with fluent speech, no focal motor/sensory deficits EXTREMITIES: No lower extremity edema   LABORATORY DATA:  I have reviewed the data as listed CMP Latest Ref Rng & Units 08/14/2017 11/19/2016 10/29/2015  Glucose 65 - 99 mg/dL 96 97 95  BUN 6 - 20 mg/dL _0 Creatinine 0.44 - 1.00 mg/dL 0.70 0.87 0.79  Sodium 135 - 145 mmol/L 139 138 139  Potassium 3.5 - 5.1 mmol/L 4.3 3.7 4.6    Chloride 101 - 111 mmol/L 104 106 107  CO2 22 - 32 mmol/L _1 Calcium 8.9 - 10.3 mg/dL 9.5 9.2 9.5  Total Protein 6.5 - 8.1 g/dL - - 6.6  Total Bilirubin 0.3 - 1.2 mg/dL - - 0.2(L)  Alkaline Phos 38 - 126 U/L - - 53  AST 15 - 41 U/L - - 26  ALT 14 - 54 U/L - - 16    Lab Results  Component Value Date   WBC 7.2 08/14/2017   HGB 14.9 08/14/2017   HCT 43.3 08/14/2017   MCV 90.2 08/14/2017   PLT 249 08/14/2017   NEUTROABS 4.5 04/05/2014    ASSESSMENT & PLAN:  Malignant neoplasm of lower-inner quadrant of right breast of female, estrogen receptor positive ([place]) 08/16/2017: Right lumpectomy: Grade 2 IDC 1.5 cm, with DCIS and necrosis, 0/3 lymph nodes negative, ER 95%, PR 30%, HER-2 negative ratio  1.14, Ki-67 40%, T1CN0 stage Ia  Pathology counseling: I discussed the final pathology report of the patient provided  a copy of this report. I discussed the margins as well as lymph node surgeries. We also discussed the final staging along with previously performed ER/PR and HER-2/neu testing. Oncotype DX recurrence score 31: 19% risk of distant recurrence of 9 years  Recommendation: 1.  Systemic chemotherapy with dose dense Adriamycin and Cytoxan x4 followed by Taxol weekly x12 2. adjuvant radiation therapy 3 followed by adjuvant antiestrogen therapy.   Patient will need port placement, the resection of the margins, echocardiogram, chemo class. She was also offered to participate in upbeat clinical trial.   UPBEAT clinical trial ([place]): Newly diagnosed stage I to III breast cancer patients receiving either adjuvant or neoadjuvant chemotherapy undergo cardiac MRI before treatment and at 24 months along with neurocognitive testing, exercise and disability measures at baseline 3, 12 and 24 months.  Return to clinic in 2 weeks to start chemotherapy No orders of the defined types were placed in this encounter.  The patient has a good understanding of the overall plan. she agrees with  it. she will call with any problems that may develop before the next visit here.   Harriette Ohara, MD 09/05/17

## 2017-09-05 NOTE — Assessment & Plan Note (Signed)
08/16/2017: Right lumpectomy: Grade 2 IDC 1.5 cm, with DCIS and necrosis, 0/3 lymph nodes negative, ER 95%, PR 30%, HER-2 negative ratio 1.14, Ki-67 40%, T1CN0 stage Ia  Pathology counseling: I discussed the final pathology report of the patient provided  a copy of this report. I discussed the margins as well as lymph node surgeries. We also discussed the final staging along with previously performed ER/PR and HER-2/neu testing. Oncotype DX recurrence score 31: 19% risk of distant recurrence of 9 years  Recommendation: 1.  Systemic chemotherapy with dose dense Adriamycin and Cytoxan x4 followed by Taxol weekly x12 2. adjuvant radiation therapy 3 followed by adjuvant antiestrogen therapy.   Patient will need port placement, the resection of the margins, echocardiogram, chemo class. She was also offered to participate in upbeat clinical trial. Return to clinic in 2 weeks to start chemotherapy

## 2017-09-06 ENCOUNTER — Other Ambulatory Visit: Payer: Self-pay | Admitting: *Deleted

## 2017-09-06 ENCOUNTER — Other Ambulatory Visit: Payer: Self-pay | Admitting: Hematology and Oncology

## 2017-09-06 DIAGNOSIS — Z170 Estrogen receptor positive status [ER+]: Principal | ICD-10-CM

## 2017-09-06 DIAGNOSIS — C50311 Malignant neoplasm of lower-inner quadrant of right female breast: Secondary | ICD-10-CM

## 2017-09-06 MED ORDER — ONDANSETRON HCL 8 MG PO TABS: 8.0000 mg | ORAL_TABLET | Freq: Two times a day (BID) | ORAL | 1 refills | Status: DC | PRN

## 2017-09-06 MED ORDER — LIDOCAINE-PRILOCAINE 2.5-2.5 % EX CREA: TOPICAL_CREAM | CUTANEOUS | 3 refills | Status: DC

## 2017-09-06 MED ORDER — LORAZEPAM 0.5 MG PO TABS: 0.5000 mg | ORAL_TABLET | Freq: Every evening | ORAL | 0 refills | Status: DC | PRN

## 2017-09-06 MED ORDER — PROCHLORPERAZINE MALEATE 10 MG PO TABS: 10.0000 mg | ORAL_TABLET | Freq: Four times a day (QID) | ORAL | 1 refills | Status: DC | PRN

## 2017-09-06 MED ORDER — DEXAMETHASONE 4 MG PO TABS: 4.0000 mg | ORAL_TABLET | Freq: Every day | ORAL | 0 refills | Status: DC

## 2017-09-06 NOTE — Progress Notes (Signed)
Ensure pre surgery drink given with instructions to complete by 0800 dos, pt verbalized understanding. 

## 2017-09-06 NOTE — Progress Notes (Signed)
START ON PATHWAY REGIMEN - Breast   Dose-Dense AC q14 days:   A cycle is every 14 days:     Doxorubicin      Cyclophosphamide      [id number]   **Always confirm dose/schedule in your pharmacy ordering system**    Paclitaxel 80 mg/m2 Weekly:   Administer weekly:     Paclitaxel   **Always confirm dose/schedule in your pharmacy ordering system**    Patient Characteristics: Postoperative without Neoadjuvant Therapy (Pathologic Staging), Invasive Disease, Adjuvant Therapy, HER2 Negative/Unknown/Equivocal, ER Positive, Node Negative, pT1a-c, pN0/N76m or pT2 or Higher, pN0, Oncotype High Risk (? 26) Therapeutic Status: Postoperative without Neoadjuvant Therapy (Pathologic Staging) AJCC Grade: G2 AJCC N Category: pN0 AJCC M Category: cM0 ER Status: Positive (+) AJCC 8 Stage Grouping: IA HER2 Status: Negative (-) Oncotype Dx Recurrence Score: 31 AJCC T Category: pT1c PR Status: Positive (+) Has this patient completed genomic testing<= Yes - Oncotype DX(R) Intent of Therapy: Curative Intent, Discussed with Patient

## 2017-09-07 ENCOUNTER — Other Ambulatory Visit (HOSPITAL_COMMUNITY): Payer: 59

## 2017-09-07 ENCOUNTER — Other Ambulatory Visit: Payer: 59

## 2017-09-08 ENCOUNTER — Telehealth: Payer: Self-pay | Admitting: Surgery

## 2017-09-08 NOTE — Telephone Encounter (Signed)
Jasmine Allen  08/09/1965 [id number]  Patient Care Team: Aletha Halim PA-C as PCP - General (Family Medicine)  This patient is a 52 y.o.female who calls today for surgical evaluation.   Date of procedure/visit: 08/16/2017  Preoperative diagnosis: Clinical stage I right breast cancer Postoperative diagnosis: Same as above Procedure: Right breast lumpectomy Right deep axillary sentinel lymph node biopsy Surgeon: Dr. Serita Grammes    Reason for call: Swelling back in the armpit.  Wondering if recurrent seroma.  52 year old female who underwent wide local excision and sentinel lymph node dissection for a presumed early breast cancer 3 weeks ago.  I believe she is due for reexcision next Tuesday for better margins.  Patient had some swelling and had a seroma aspirated in the office once already.  Patient notes that she is been having gradual swelling for the past few days.  She called Saturday morning wanting to know what to do.  Apparently has been concerned because it looks a little bruised and may be swelling is going on the shoulder.  Patient denies any severe pain.  Just a little discomfort.  She can still use her arm just fine.  It is not rapidly increasing.  There is no redness.  It is not hot.  It is not draining.  She denies any numbness.  She denies any fevers or chills.  I noted that intervention over the weekend will require come in the emergency room with possible needle aspiration.  Another option is to wait on until surgery on Tuesday.  Avoid use of the arm too much.  Ice pack.  Elta Guadeloupe out the size of it with a sharpie pain to follow the size.  More reasonable compromise is to call at least Monday morning to give feedback and see.  If much larger and uncomfortable, may benefit from aspiration in the office on Monday and regroup.  She is guardedly hopeful she can wait till Tuesday but we will try and keep in touch to let us know how things are going.  She expressed understanding  and appreciation.  Patient Active Problem List   Diagnosis Date Noted  . Malignant neoplasm of lower-inner quadrant of right breast of female, estrogen receptor positive ([place]) 08/23/2017  . Chest pain 04/07/2014  . Smoking 04/07/2014  . ANEMIA, IRON DEFICIENCY 10/20/2009  . DEPRESSION 06/15/2009  . GERD 06/15/2009  . IRRITABLE BOWEL SYNDROME 06/15/2009  . RECTAL BLEEDING 06/15/2009    Past Medical History:  Diagnosis Date  . Alopecia, unspecified   . Anemia, unspecified    during her pregnancy  . Anxiety state, unspecified   . Bulging disc   . Depressive disorder, not elsewhere classified   . Diverticulosis of colon (without mention of hemorrhage)    CT Scan  . Dysrhythmia    PVCs in the past  . Esophageal reflux   . Esophagitis 1998  . Hiatal hernia 1998  . History of kidney stones   . Hypercholesteremia   . Hypertension   . Migraine   . Opioid abuse, in remission [facility])     Past Surgical History:  Procedure Laterality Date  . ABLATION    . BREAST LUMPECTOMY WITH AXILLARY LYMPH NODE BIOPSY Right 08/16/2017   Procedure: RIGHT BREAST LUMPECTOMY WITH AXILLARY SENTINEL LYMPH NODE BIOPSY;  Surgeon: Rolm Bookbinder, MD;  Location: [facility];  Service: General;  Laterality: Right;  . CARDIAC CATHETERIZATION  2011   normal coronaries  . KNEE SURGERY    . TONSILLECTOMY  Social History   Socioeconomic History  . Marital status: Married    Spouse name: Not on file  . Number of children: Not on file  . Years of education: Not on file  . Highest education level: Not on file  Occupational History  . Not on file  Social Needs  . Financial resource strain: Not on file  . Food insecurity:    Worry: Not on file    Inability: Not on file  . Transportation needs:    Medical: Not on file    Non-medical: Not on file  Tobacco Use  . Smoking status: Former Smoker    Packs/day: 0.25    Last attempt to quit: 08/25/2016    Years since quitting: 1.0  . Smokeless tobacco:  Never Used  Substance and Sexual Activity  . Alcohol use: Yes    Comment: "once a week"  . Drug use: No  . Sexual activity: Yes    Birth control/protection: None, Post-menopausal  Lifestyle  . Physical activity:    Days per week: Not on file    Minutes per session: Not on file  . Stress: Not on file  Relationships  . Social connections:    Talks on phone: Not on file    Gets together: Not on file    Attends religious service: Not on file    Active member of club or organization: Not on file    Attends meetings of clubs or organizations: Not on file    Relationship status: Not on file  . Intimate partner violence:    Fear of current or ex partner: Not on file    Emotionally abused: Not on file    Physically abused: Not on file    Forced sexual activity: Not on file  Other Topics Concern  . Not on file  Social History Narrative  . Not on file    Family History  Problem Relation Age of Onset  . Heart disease Father        CABG  . Breast cancer Mother   . Breast cancer Maternal Grandmother   . Breast cancer Cousin   . Colon cancer Neg Hx   . Stomach cancer Neg Hx     Current Outpatient Medications  Medication Sig Dispense Refill  . ACETAMINOPHEN-BUTALBITAL 50-325 MG TABS Take 1 tablet by mouth daily as needed (for headache).   0  . ALPRAZolam (XANAX) 0.5 MG tablet Take 0.5 mg by mouth at bedtime as needed for anxiety or sleep.     . Biotin 10000 MCG TABS Take 10,000 mcg by mouth daily.     . cetirizine (ZYRTEC) 10 MG tablet Take 10 mg by mouth every morning.     . cloNIDine (CATAPRES) 0.3 MG tablet Take 0.3 mg by mouth at bedtime.     . cyclobenzaprine (FLEXERIL) 10 MG tablet Take 10 mg by mouth every 8 (eight) hours as needed for muscle spasms.  1  . dexamethasone (DECADRON) 4 MG tablet Take 1 tablet (4 mg total) by mouth daily. Take 1 tablet day after chemo and 1 tablet 2 days after chemo with food 8 tablet 0  . furosemide (LASIX) 20 MG tablet Take 20 mg by mouth  daily as needed for fluid.  5  . hydrocortisone valerate cream (WESTCORT) 0.2 % Apply 1 application topically daily as needed (eczema).     . hydrOXYzine (ATARAX/VISTARIL) 10 MG tablet Take 10-30 mg by mouth at bedtime as needed for itching.   0  .  hyoscyamine (LEVSIN SL) 0.125 MG SL tablet dissolve 1 tablet under the tongue every 4 hours if needed (Patient taking differently: dissolve 1 tablet under the tongue every 4 hours if needed for stomach) 30 tablet 0  . ibuprofen (ADVIL,MOTRIN) 200 MG tablet Take 400-800 mg by mouth every 6 (six) hours as needed for fever, headache or moderate pain.    Marland Kitchen lidocaine-prilocaine (EMLA) cream Apply to affected area once 30 g 3  . LORazepam (ATIVAN) 0.5 MG tablet Take 1 tablet (0.5 mg total) by mouth at bedtime as needed (Nausea or vomiting). 30 tablet 0  . Melatonin 5 MG CAPS Take 5 mg by mouth at bedtime as needed (for sleep).     . Multiple Vitamin (MULITIVITAMIN WITH MINERALS) TABS Take 1 tablet by mouth daily.    . ondansetron (ZOFRAN) 8 MG tablet Take 1 tablet (8 mg total) by mouth 2 (two) times daily as needed. Start on the third day after chemotherapy. 30 tablet 1  . oxyCODONE (OXY IR/ROXICODONE) 5 MG immediate release tablet Take 1 tablet (5 mg total) by mouth every 6 (six) hours as needed for moderate pain, severe pain or breakthrough pain. 10 tablet 0  . pantoprazole (PROTONIX) 40 MG tablet Take 1 tablet (40 mg total) by mouth 2 (two) times daily before a meal. (Patient taking differently: Take 40 mg by mouth daily. ) 60 tablet 6  . prochlorperazine (COMPAZINE) 10 MG tablet Take 1 tablet (10 mg total) by mouth every 6 (six) hours as needed (Nausea or vomiting). 30 tablet 1  . spironolactone (ALDACTONE) 25 MG tablet Take 25 mg by mouth daily.    . traZODone (DESYREL) 100 MG tablet Take 100 mg by mouth at bedtime.     . valACYclovir (VALTREX) 1000 MG tablet Take 1,000 mg by mouth 2 (two) times daily as needed (for out breaks).      No current  facility-administered medications for this visit.      Allergies  Allergen Reactions  . Amoxicillin-Pot Clavulanate Other (See Comments)    Big doses cause diarrhea   . Erythromycin Rash  . Metoclopramide Hcl Other (See Comments)    Restless legs  . Sulfonamide Derivatives Rash    @VS @  Nm Sentinel Node Inj-no Rpt (breast)  Result Date: 08/16/2017 Sulfur colloid was injected by the nuclear medicine technologist for melanoma sentinel node.    Note: This dictation was prepared with Dragon/digital dictation along with [employer]. Any transcriptional errors that result from this process are unintentional.   .Adin Hector, M.D., F.A.C.S. Gastrointestinal and Minimally Invasive Surgery [facility] [address] [place], [place] [address] [phone] Main / Paging  09/08/2017 9:43 AM

## 2017-09-11 ENCOUNTER — Ambulatory Visit (HOSPITAL_BASED_OUTPATIENT_CLINIC_OR_DEPARTMENT_OTHER): Payer: 59 | Admitting: Anesthesiology

## 2017-09-11 ENCOUNTER — Ambulatory Visit (HOSPITAL_BASED_OUTPATIENT_CLINIC_OR_DEPARTMENT_OTHER)
Admission: RE | Admit: 2017-09-11 | Discharge: 2017-09-11 | Disposition: A | Discharge status: Home / Self Care | Payer: 59 | Source: Ambulatory Visit | Attending: General Surgery | Admitting: General Surgery

## 2017-09-11 ENCOUNTER — Ambulatory Visit (HOSPITAL_COMMUNITY): Payer: 59

## 2017-09-11 ENCOUNTER — Encounter (HOSPITAL_BASED_OUTPATIENT_CLINIC_OR_DEPARTMENT_OTHER): Payer: Self-pay

## 2017-09-11 ENCOUNTER — Other Ambulatory Visit (HOSPITAL_COMMUNITY): Payer: 59

## 2017-09-11 ENCOUNTER — Encounter (HOSPITAL_BASED_OUTPATIENT_CLINIC_OR_DEPARTMENT_OTHER)
Admission: RE | Disposition: A | Discharge status: Home / Self Care | Payer: Self-pay | Source: Ambulatory Visit | Attending: General Surgery

## 2017-09-11 ENCOUNTER — Other Ambulatory Visit: Payer: Self-pay

## 2017-09-11 DIAGNOSIS — Z881 Allergy status to other antibiotic agents status: Secondary | ICD-10-CM | POA: Insufficient documentation

## 2017-09-11 DIAGNOSIS — Z87891 Personal history of nicotine dependence: Secondary | ICD-10-CM | POA: Diagnosis not present

## 2017-09-11 DIAGNOSIS — C50311 Malignant neoplasm of lower-inner quadrant of right female breast: Secondary | ICD-10-CM | POA: Diagnosis not present

## 2017-09-11 DIAGNOSIS — K219 Gastro-esophageal reflux disease without esophagitis: Secondary | ICD-10-CM | POA: Diagnosis not present

## 2017-09-11 DIAGNOSIS — F419 Anxiety disorder, unspecified: Secondary | ICD-10-CM | POA: Diagnosis not present

## 2017-09-11 DIAGNOSIS — Z95828 Presence of other vascular implants and grafts: Secondary | ICD-10-CM

## 2017-09-11 DIAGNOSIS — Z888 Allergy status to other drugs, medicaments and biological substances status: Secondary | ICD-10-CM | POA: Insufficient documentation

## 2017-09-11 DIAGNOSIS — Z882 Allergy status to sulfonamides status: Secondary | ICD-10-CM | POA: Insufficient documentation

## 2017-09-11 DIAGNOSIS — I10 Essential (primary) hypertension: Secondary | ICD-10-CM | POA: Diagnosis not present

## 2017-09-11 DIAGNOSIS — Z79890 Hormone replacement therapy (postmenopausal): Secondary | ICD-10-CM | POA: Diagnosis not present

## 2017-09-11 DIAGNOSIS — C50911 Malignant neoplasm of unspecified site of right female breast: Secondary | ICD-10-CM | POA: Diagnosis present

## 2017-09-11 DIAGNOSIS — Z803 Family history of malignant neoplasm of breast: Secondary | ICD-10-CM | POA: Diagnosis not present

## 2017-09-11 DIAGNOSIS — C50919 Malignant neoplasm of unspecified site of unspecified female breast: Secondary | ICD-10-CM

## 2017-09-11 HISTORY — PX: RE-EXCISION OF BREAST LUMPECTOMY: SHX6048

## 2017-09-11 HISTORY — PX: PORTACATH PLACEMENT: SHX2246

## 2017-09-11 SURGERY — INSERTION, TUNNELED CENTRAL VENOUS DEVICE, WITH PORT
Anesthesia: General | Site: Chest | Laterality: Right

## 2017-09-11 MED ORDER — LIDOCAINE HCL (CARDIAC) PF 100 MG/5ML IV SOSY
PREFILLED_SYRINGE | INTRAVENOUS | Status: DC | PRN
  Administered 2017-09-11: 100 mg via INTRAVENOUS

## 2017-09-11 MED ORDER — LACTATED RINGERS IV SOLN
INTRAVENOUS | Status: DC
  Administered 2017-09-11: 12:00:00 via INTRAVENOUS

## 2017-09-11 MED ORDER — ONDANSETRON HCL 4 MG/2ML IJ SOLN
INTRAMUSCULAR | Status: AC
  Filled 2017-09-11: qty 2

## 2017-09-11 MED ORDER — EPHEDRINE SULFATE 50 MG/ML IJ SOLN
INTRAMUSCULAR | Status: AC
  Filled 2017-09-11: qty 1

## 2017-09-11 MED ORDER — MIDAZOLAM HCL 5 MG/5ML IJ SOLN
INTRAMUSCULAR | Status: DC | PRN
  Administered 2017-09-11: 2 mg via INTRAVENOUS

## 2017-09-11 MED ORDER — ENSURE PRE-SURGERY PO LIQD: 296.0000 mL | Freq: Once | ORAL | Status: DC

## 2017-09-11 MED ORDER — KETOROLAC TROMETHAMINE 15 MG/ML IJ SOLN: 15.0000 mg | INTRAMUSCULAR | Status: DC

## 2017-09-11 MED ORDER — HYDROMORPHONE HCL 1 MG/ML IJ SOLN
INTRAMUSCULAR | Status: AC
  Filled 2017-09-11: qty 0.5

## 2017-09-11 MED ORDER — OXYCODONE HCL 5 MG/5ML PO SOLN: 5.0000 mg | Freq: Once | ORAL | Status: AC | PRN

## 2017-09-11 MED ORDER — KETOROLAC TROMETHAMINE 15 MG/ML IJ SOLN
15.0000 mg | INTRAMUSCULAR | Status: AC
  Administered 2017-09-11: 15 mg via INTRAVENOUS

## 2017-09-11 MED ORDER — PROPOFOL 10 MG/ML IV BOLUS
INTRAVENOUS | Status: AC
  Filled 2017-09-11: qty 20

## 2017-09-11 MED ORDER — ACETAMINOPHEN 500 MG PO TABS
1000.0000 mg | ORAL_TABLET | ORAL | Status: AC
  Administered 2017-09-11: 1000 mg via ORAL

## 2017-09-11 MED ORDER — GABAPENTIN 100 MG PO CAPS
ORAL_CAPSULE | ORAL | Status: AC
  Filled 2017-09-11: qty 1

## 2017-09-11 MED ORDER — MIDAZOLAM HCL 2 MG/2ML IJ SOLN: 1.0000 mg | INTRAMUSCULAR | Status: DC | PRN

## 2017-09-11 MED ORDER — MEPERIDINE HCL 25 MG/ML IJ SOLN: 6.2500 mg | INTRAMUSCULAR | Status: DC | PRN

## 2017-09-11 MED ORDER — LIDOCAINE HCL (CARDIAC) PF 100 MG/5ML IV SOSY
PREFILLED_SYRINGE | INTRAVENOUS | Status: AC
  Filled 2017-09-11: qty 5

## 2017-09-11 MED ORDER — GABAPENTIN 100 MG PO CAPS
100.0000 mg | ORAL_CAPSULE | ORAL | Status: AC
  Administered 2017-09-11: 100 mg via ORAL

## 2017-09-11 MED ORDER — FENTANYL CITRATE (PF) 100 MCG/2ML IJ SOLN
INTRAMUSCULAR | Status: AC
  Filled 2017-09-11: qty 2

## 2017-09-11 MED ORDER — GABAPENTIN 300 MG PO CAPS: 300.0000 mg | ORAL_CAPSULE | ORAL | Status: DC

## 2017-09-11 MED ORDER — CIPROFLOXACIN IN D5W 400 MG/200ML IV SOLN: 400.0000 mg | INTRAVENOUS | Status: DC

## 2017-09-11 MED ORDER — PROPOFOL 10 MG/ML IV BOLUS
INTRAVENOUS | Status: DC | PRN
  Administered 2017-09-11: 150 mg via INTRAVENOUS

## 2017-09-11 MED ORDER — BUPIVACAINE HCL (PF) 0.25 % IJ SOLN
INTRAMUSCULAR | Status: DC | PRN
  Administered 2017-09-11: 7 mL

## 2017-09-11 MED ORDER — PROMETHAZINE HCL 25 MG/ML IJ SOLN: 6.2500 mg | INTRAMUSCULAR | Status: DC | PRN

## 2017-09-11 MED ORDER — CIPROFLOXACIN IN D5W 400 MG/200ML IV SOLN
INTRAVENOUS | Status: AC
  Filled 2017-09-11: qty 200

## 2017-09-11 MED ORDER — OXYCODONE HCL 5 MG PO TABS
5.0000 mg | ORAL_TABLET | Freq: Once | ORAL | Status: AC | PRN
Start: 2017-09-11 — End: 2017-09-11
  Administered 2017-09-11: 5 mg via ORAL

## 2017-09-11 MED ORDER — SCOPOLAMINE 1 MG/3DAYS TD PT72
1.0000 | MEDICATED_PATCH | Freq: Once | TRANSDERMAL | Status: DC | PRN
Start: 2017-09-11 — End: 2017-09-11

## 2017-09-11 MED ORDER — KETOROLAC TROMETHAMINE 15 MG/ML IJ SOLN
INTRAMUSCULAR | Status: AC
  Filled 2017-09-11: qty 1

## 2017-09-11 MED ORDER — DEXAMETHASONE SODIUM PHOSPHATE 10 MG/ML IJ SOLN
INTRAMUSCULAR | Status: AC
  Filled 2017-09-11: qty 1

## 2017-09-11 MED ORDER — DEXAMETHASONE SODIUM PHOSPHATE 10 MG/ML IJ SOLN
INTRAMUSCULAR | Status: DC | PRN
  Administered 2017-09-11: 5 mg via INTRAVENOUS

## 2017-09-11 MED ORDER — ONDANSETRON HCL 4 MG/2ML IJ SOLN
INTRAMUSCULAR | Status: DC | PRN
  Administered 2017-09-11: 4 mg via INTRAVENOUS

## 2017-09-11 MED ORDER — HEPARIN SOD (PORK) LOCK FLUSH 100 UNIT/ML IV SOLN
INTRAVENOUS | Status: DC | PRN
  Administered 2017-09-11: 400 [IU] via INTRAVENOUS

## 2017-09-11 MED ORDER — FENTANYL CITRATE (PF) 100 MCG/2ML IJ SOLN
INTRAMUSCULAR | Status: DC | PRN
  Administered 2017-09-11 (×2): 100 ug via INTRAVENOUS

## 2017-09-11 MED ORDER — HEPARIN (PORCINE) IN NACL 2-0.9 UNITS/ML
INTRAMUSCULAR | Status: AC | PRN
  Administered 2017-09-11: 1 via INTRAVENOUS

## 2017-09-11 MED ORDER — HYDROMORPHONE HCL 1 MG/ML IJ SOLN
0.2500 mg | INTRAMUSCULAR | Status: DC | PRN
  Administered 2017-09-11: 0.5 mg via INTRAVENOUS
  Administered 2017-09-11: 0.25 mg via INTRAVENOUS

## 2017-09-11 MED ORDER — MIDAZOLAM HCL 2 MG/2ML IJ SOLN
INTRAMUSCULAR | Status: AC
  Filled 2017-09-11: qty 2

## 2017-09-11 MED ORDER — ACETAMINOPHEN 500 MG PO TABS: 1000.0000 mg | ORAL_TABLET | ORAL | Status: DC

## 2017-09-11 MED ORDER — OXYCODONE HCL 5 MG PO TABS
5.0000 mg | ORAL_TABLET | Freq: Four times a day (QID) | ORAL | 0 refills | Status: DC | PRN
Start: 2017-09-11 — End: 2017-09-25

## 2017-09-11 MED ORDER — FENTANYL CITRATE (PF) 100 MCG/2ML IJ SOLN: 50.0000 ug | INTRAMUSCULAR | Status: DC | PRN

## 2017-09-11 MED ORDER — OXYCODONE HCL 5 MG PO TABS
ORAL_TABLET | ORAL | Status: AC
  Filled 2017-09-11: qty 1

## 2017-09-11 MED ORDER — CIPROFLOXACIN IN D5W 400 MG/200ML IV SOLN
400.0000 mg | INTRAVENOUS | Status: AC
  Administered 2017-09-11: 400 mg via INTRAVENOUS

## 2017-09-11 MED ORDER — ACETAMINOPHEN 500 MG PO TABS
ORAL_TABLET | ORAL | Status: AC
  Filled 2017-09-11: qty 2

## 2017-09-11 SURGICAL SUPPLY — 75 items
BAG DECANTER FOR FLEXI CONT (MISCELLANEOUS) ×4 IMPLANT
BENZOIN TINCTURE PRP APPL 2/3 (GAUZE/BANDAGES/DRESSINGS) ×4 IMPLANT
BINDER BREAST LRG (GAUZE/BANDAGES/DRESSINGS) IMPLANT
BINDER BREAST MEDIUM (GAUZE/BANDAGES/DRESSINGS) ×2 IMPLANT
BINDER BREAST XLRG (GAUZE/BANDAGES/DRESSINGS) IMPLANT
BINDER BREAST XXLRG (GAUZE/BANDAGES/DRESSINGS) IMPLANT
BLADE SURG 11 STRL SS (BLADE) ×4 IMPLANT
BLADE SURG 15 STRL LF DISP TIS (BLADE) ×2 IMPLANT
BLADE SURG 15 STRL SS (BLADE) ×2
CANISTER SUCT 1200ML W/VALVE (MISCELLANEOUS) ×4 IMPLANT
CHLORAPREP W/TINT 26ML (MISCELLANEOUS) ×4 IMPLANT
CLIP VESOCCLUDE SM WIDE 6/CT (CLIP) IMPLANT
CLOSURE WOUND 1/2 X4 (GAUZE/BANDAGES/DRESSINGS) ×1
COVER BACK TABLE 60X90IN (DRAPES) ×4 IMPLANT
COVER MAYO STAND STRL (DRAPES) ×4 IMPLANT
COVER PROBE 5X48 (MISCELLANEOUS) ×2
DECANTER SPIKE VIAL GLASS SM (MISCELLANEOUS) ×2 IMPLANT
DERMABOND ADVANCED (GAUZE/BANDAGES/DRESSINGS) ×4
DERMABOND ADVANCED .7 DNX12 (GAUZE/BANDAGES/DRESSINGS) ×2 IMPLANT
DEVICE DUBIN W/COMP PLATE 8390 (MISCELLANEOUS) IMPLANT
DRAPE C-ARM 42X72 X-RAY (DRAPES) ×4 IMPLANT
DRAPE LAPAROSCOPIC ABDOMINAL (DRAPES) ×4 IMPLANT
DRAPE UTILITY XL STRL (DRAPES) ×4 IMPLANT
DRSG TEGADERM 4X4.75 (GAUZE/BANDAGES/DRESSINGS) IMPLANT
ELECT COATED BLADE 2.86 ST (ELECTRODE) ×4 IMPLANT
ELECT REM PT RETURN 9FT ADLT (ELECTROSURGICAL) ×4
ELECTRODE REM PT RTRN 9FT ADLT (ELECTROSURGICAL) ×2 IMPLANT
GAUZE SPONGE 4X4 12PLY STRL LF (GAUZE/BANDAGES/DRESSINGS) ×4 IMPLANT
GLOVE BIO SURGEON STRL SZ 6.5 (GLOVE) ×1 IMPLANT
GLOVE BIO SURGEON STRL SZ7 (GLOVE) ×6 IMPLANT
GLOVE BIO SURGEONS STRL SZ 6.5 (GLOVE) ×1
GLOVE BIOGEL PI IND STRL 6.5 (GLOVE) IMPLANT
GLOVE BIOGEL PI IND STRL 7.5 (GLOVE) ×2 IMPLANT
GLOVE BIOGEL PI INDICATOR 6.5 (GLOVE) ×2
GLOVE BIOGEL PI INDICATOR 7.5 (GLOVE) ×2
GOWN STRL REUS W/ TWL LRG LVL3 (GOWN DISPOSABLE) ×4 IMPLANT
GOWN STRL REUS W/ TWL XL LVL3 (GOWN DISPOSABLE) IMPLANT
GOWN STRL REUS W/TWL LRG LVL3 (GOWN DISPOSABLE) ×4
GOWN STRL REUS W/TWL XL LVL3 (GOWN DISPOSABLE) ×2
ILLUMINATOR WAVEGUIDE N/F (MISCELLANEOUS) IMPLANT
IV KIT MINILOC 20X1 SAFETY (NEEDLE) IMPLANT
KIT CVR 48X5XPRB PLUP LF (MISCELLANEOUS) IMPLANT
KIT PORT POWER 8FR ISP CVUE (Port) ×2 IMPLANT
LIGHT WAVEGUIDE WIDE FLAT (MISCELLANEOUS) IMPLANT
NDL HYPO 25X1 1.5 SAFETY (NEEDLE) ×2 IMPLANT
NDL SAFETY ECLIPSE 18X1.5 (NEEDLE) IMPLANT
NEEDLE HYPO 18GX1.5 SHARP (NEEDLE)
NEEDLE HYPO 25X1 1.5 SAFETY (NEEDLE) ×4 IMPLANT
NS IRRIG 1000ML POUR BTL (IV SOLUTION) IMPLANT
PACK BASIN DAY SURGERY FS (CUSTOM PROCEDURE TRAY) ×4 IMPLANT
PENCIL BUTTON HOLSTER BLD 10FT (ELECTRODE) ×4 IMPLANT
SLEEVE SCD COMPRESS KNEE MED (MISCELLANEOUS) ×4 IMPLANT
SPONGE LAP 4X18 RFD (DISPOSABLE) ×4 IMPLANT
STRIP CLOSURE SKIN 1/2X4 (GAUZE/BANDAGES/DRESSINGS) ×3 IMPLANT
SUT MNCRL AB 3-0 PS2 18 (SUTURE) IMPLANT
SUT MNCRL AB 4-0 PS2 18 (SUTURE) ×4 IMPLANT
SUT MON AB 4-0 PC3 18 (SUTURE) ×2 IMPLANT
SUT MON AB 5-0 PS2 18 (SUTURE) IMPLANT
SUT PROLENE 2 0 SH DA (SUTURE) ×4 IMPLANT
SUT SILK 2 0 SH (SUTURE) ×4 IMPLANT
SUT SILK 2 0 TIES 17X18 (SUTURE)
SUT SILK 2-0 18XBRD TIE BLK (SUTURE) IMPLANT
SUT VIC AB 2-0 SH 27 (SUTURE) ×2
SUT VIC AB 2-0 SH 27XBRD (SUTURE) ×2 IMPLANT
SUT VIC AB 3-0 SH 27 (SUTURE) ×4
SUT VIC AB 3-0 SH 27X BRD (SUTURE) ×2 IMPLANT
SUT VIC AB 5-0 PS2 18 (SUTURE) IMPLANT
SUT VICRYL AB 3 0 TIES (SUTURE) IMPLANT
SYR 5ML LUER SLIP (SYRINGE) ×4 IMPLANT
SYR CONTROL 10ML LL (SYRINGE) ×4 IMPLANT
TOWEL GREEN STERILE FF (TOWEL DISPOSABLE) ×4 IMPLANT
TOWEL OR NON WOVEN STRL DISP B (DISPOSABLE) ×4 IMPLANT
TUBE CONNECTING 20'X1/4 (TUBING)
TUBE CONNECTING 20X1/4 (TUBING) ×2 IMPLANT
YANKAUER SUCT BULB TIP NO VENT (SUCTIONS) ×2 IMPLANT

## 2017-09-11 NOTE — Interval H&P Note (Signed)
History and Physical Interval Note:  09/11/2017 10:06 AM  Jasmine Allen  has presented today for surgery, with the diagnosis of breast cancer  The various methods of treatment have been discussed with the patient and family. After consideration of risks, benefits and other options for treatment, the patient has consented to  Procedure(s): RE-EXCISION OF RIGHT BREAST CANCER MARGINS (Right) INSERTION PORT-A-CATH WITH [place] (N/A) as a surgical intervention .  The patient's history has been reviewed, patient examined, no change in status, stable for surgery.  I have reviewed the patient's chart and labs.  Questions were answered to the patient's satisfaction.     Rolm Bookbinder

## 2017-09-11 NOTE — Op Note (Signed)
Preoperative diagnosis: Right breast cancer with positive anterior margin on lumpectomy, Oncotype of 31 in need of venous access for chemotherapy Postoperative diagnosis: Same as above Procedure: 1.  Right internal jugular port placement with ultrasound guidance 2.  Excision of right breast anterior margin Surgeon: Dr. Serita Grammes Anesthesia: General Estimated blood loss: Minimal Complications: None Drains: None Specimens: Right breast anterior margin including skin with short superior, long lateral, double stitch deep Sponge needle count was correct at completion Disposition to recovery stable condition  Indications: This is a 52 year old female who presented with a clinical stage I she underwent lumpectomy and sentinel lymph node biopsy.  Her cancer was focally positive at the anterior margin and her Oncotype was 31.  She is node negative.  We discussed a port placement as she will now get chemotherapy as well as excision of this anterior margin.  Procedure: After informed consent was obtained the patient was taken to the operating room.  She received antibiotics.  SCDs were in place.  She was then placed under general anesthesia without complication.  Right breast cancer.  Her right breast and neck were then prepped and draped in the standard sterile surgical fashion.  A surgical timeout was then performed.  I used the ultrasound to identify the internal jugular vein.  I then made a small skin nick with the 11 blade.  I access the jugular vein with the needle.  I then placed the wire.  The wire was confirmed to be in the jugular vein with ultrasound as well as fluoroscopy.  I then made an incision below her right clavicle after infiltrating Marcaine.  I created a pocket overlying the pectoralis fascia.  I then tunneled the line between the 2 sites.  I then placed the dilator and sheath assembly over the wire.  I watched this under fluoroscopy soon as it went into position.  I then remove the  wire assembly.  I placed the line into the peel-away sheath.  The sheath was then removed.  The line was then pulled back to be in the distal cava.  I then hooked the line to the port.  This was placed in the pocket and sutured into position with 2-0 Prolene in 2 places.  This flushed easily and aspirated blood.  I packed this with concentrated heparin.  I closed this with 3-0 Vicryl 4-0 Monocryl.  Glue were placed on these incisions.  I then infiltrated Marcaine in her prior right breast inframammary incision.  I made a elliptical incision to include the old incision as well as a skin paddle as her anterior margin was positive.  I then used cautery to remove the remainder of the anterior margin of the cavity.  This was marked as above.  This was passed off the table as a specimen.  Hemostasis was obtained.  I closed this with 2-0 Vicryl, 3-0 Vicryl, and 4-0 Monocryl.  Dermabond was placed.  She tolerated this well was extubated and transferred to the recovery room in stable condition.

## 2017-09-11 NOTE — Transfer of Care (Signed)
Immediate Anesthesia Transfer of Care Note  Patient: Jasmine Allen  Procedure(s) Performed: INSERTION PORT-A-CATH WITH [place] (Right Chest) RE-EXCISION OF BREAST LUMPECTOMY (Right Breast)  Patient Location: PACU  Anesthesia Type:General  Level of Consciousness: awake, alert  and oriented  Airway & Oxygen Therapy: Patient Spontanous Breathing and Patient connected to face mask oxygen  Post-op Assessment: Report given to RN and Post -op Vital signs reviewed and stable  Post vital signs: Reviewed and stable  Last Vitals:  Vitals Value Taken Time  BP    Temp    Pulse    Resp 17 09/11/2017 12:53 PM  SpO2    Vitals shown include unvalidated device data.  Last Pain:  Vitals:   09/11/17 1025  TempSrc: Oral  PainSc: 5          Complications: No apparent anesthesia complications

## 2017-09-11 NOTE — Anesthesia Preprocedure Evaluation (Signed)
Anesthesia Evaluation  Patient identified by MRN, date of birth, ID band Patient awake    Reviewed: Allergy & Precautions, H&P , NPO status , Patient's Chart, lab work & pertinent test results, reviewed documented beta blocker date and time   Airway Mallampati: II  TM Distance: >3 FB Neck ROM: full    Dental no notable dental hx.    Pulmonary neg pulmonary ROS, former smoker,    Pulmonary exam normal breath sounds clear to auscultation       Cardiovascular Exercise Tolerance: Good hypertension, Pt. on medications + dysrhythmias  Rhythm:regular Rate:Normal     Neuro/Psych  Headaches, PSYCHIATRIC DISORDERS Anxiety Depression  Neuromuscular disease    GI/Hepatic Neg liver ROS, hiatal hernia, GERD  Medicated,  Endo/Other  negative endocrine ROS  Renal/GU negative Renal ROS  negative genitourinary   Musculoskeletal   Abdominal   Peds  Hematology  (+) anemia ,   Anesthesia Other Findings   Reproductive/Obstetrics negative OB ROS                            Anesthesia Physical  Anesthesia Plan  ASA: III  Anesthesia Plan: General   Post-op Pain Management:    Induction: Intravenous  PONV Risk Score and Plan: 3 and Ondansetron, Dexamethasone and Treatment may vary due to age or medical condition  Airway Management Planned: LMA  Additional Equipment:   Intra-op Plan:   Post-operative Plan: Extubation in OR  Informed Consent: I have reviewed the patients History and Physical, chart, labs and discussed the procedure including the risks, benefits and alternatives for the proposed anesthesia with the patient or authorized representative who has indicated his/her understanding and acceptance.   Dental Advisory Given  Plan Discussed with: CRNA, Anesthesiologist and Surgeon  Anesthesia Plan Comments: (  )        Anesthesia Quick Evaluation

## 2017-09-11 NOTE — Anesthesia Postprocedure Evaluation (Signed)
Anesthesia Post Note  Patient: Jasmine Allen  Procedure(s) Performed: INSERTION PORT-A-CATH WITH ULTRASOUND (Right Chest) RE-EXCISION OF BREAST LUMPECTOMY (Right Breast)     Patient location during evaluation: PACU Anesthesia Type: General Level of consciousness: sedated and patient cooperative Pain management: pain level controlled Vital Signs Assessment: post-procedure vital signs reviewed and stable Respiratory status: spontaneous breathing Cardiovascular status: stable Anesthetic complications: no Comments: Pt complaining of blood blister under her eye. It is small (73mm in diameteer) under medial canthus of her right eye. I told her I was not made aware of any issues in the OR that would explain this injury, and I don't believe that eye tape was over that area. However, the CRNA and Dr. Donne Hazel both are no longer present to ask. I told her to call Dr. Cristal Generous office if she still had concerns and to call [facility] tomorrow if she had more questions or concerns.     Last Vitals:  Vitals:   09/11/17 1347 09/11/17 1420  BP: 125/70 (!) 160/74  Pulse: 66 66  Resp: 15 20  Temp:  36.8 C  SpO2: 98% 99%    Last Pain:  Vitals:   09/11/17 1420  TempSrc:   PainSc: 2                  Nolon Nations

## 2017-09-11 NOTE — Anesthesia Procedure Notes (Signed)
Procedure Name: LMA Insertion Date/Time: 09/11/2017 11:54 AM Performed by: Jonna Munro, CRNA Pre-anesthesia Checklist: Patient identified, Emergency Drugs available, Suction available, Patient being monitored and Timeout performed Patient Re-evaluated:Patient Re-evaluated prior to induction Oxygen Delivery Method: Circle system utilized Preoxygenation: Pre-oxygenation with 100% oxygen Induction Type: IV induction LMA: LMA inserted LMA Size: 4.0 Number of attempts: 1 Placement Confirmation: positive ETCO2 and breath sounds checked- equal and bilateral Tube secured with: Tape Dental Injury: Teeth and Oropharynx as per pre-operative assessment

## 2017-09-11 NOTE — Discharge Instructions (Signed)
PORT-A-CATH: POST OP INSTRUCTIONS  Always review your discharge instruction sheet given to you by the facility where your surgery was performed.   1. A prescription for pain medication may be given to you upon discharge. Take your pain medication as prescribed, if needed. If narcotic pain medicine is not needed, then you make take acetaminophen (Tylenol) or ibuprofen (Advil) as needed.  2. Take your usually prescribed medications unless otherwise directed. 3. If you need a refill on your pain medication, please contact our office. All narcotic pain medicine now requires a paper prescription.  Phoned in and fax refills are no longer allowed by law.  Prescriptions will not be filled after 5 pm or on weekends.  4. You should follow a light diet for the remainder of the day after your procedure. 5. Most patients will experience some mild swelling and/or bruising in the area of the incision. It may take several days to resolve. 6. It is common to experience some constipation if taking pain medication after surgery. Increasing fluid intake and taking a stool softener (such as Colace) will usually help or prevent this problem from occurring. A mild laxative (Milk of Magnesia or Miralax) should be taken according to package directions if there are no bowel movements after 48 hours.  7. Unless discharge instructions indicate otherwise, you may remove your bandages 48 hours after surgery, and you may shower at that time. You may have steri-strips (small white skin tapes) in place directly over the incision.  These strips should be left on the skin for 7-10 days.  If your surgeon used Dermabond (skin glue) on the incision, you may shower in 24 hours.  The glue will flake off over the next 2-3 weeks.  8. If your port is left accessed at the end of surgery (needle left in port), the dressing cannot get wet and should only by changed by a healthcare professional. When the port is no longer accessed (when the  needle has been removed), follow step 7.   9. ACTIVITIES:  Limit activity involving your arms for the next 72 hours. Do no strenuous exercise or activity for 1 week. You may drive when you are no longer taking prescription pain medication, you can comfortably wear a seatbelt, and you can maneuver your car. 10.You may need to see your doctor in the office for a follow-up appointment.  Please       check with your doctor.  11.When you receive a new Port-a-Cath, you will get a product guide and        ID card.  Please keep them in case you need them.  WHEN TO CALL YOUR DOCTOR [phone]): 1. Fever over 101.0 2. Chills 3. Continued bleeding from incision 4. Increased redness and tenderness at the site 5. Shortness of breath, difficulty breathing   The clinic staff is available to answer your questions during regular business hours. Please dont hesitate to call and ask to speak to one of the nurses or medical assistants for clinical concerns. If you have a medical emergency, go to the nearest emergency room or call 911.  A surgeon from [facility] is always on call at the hospital.     For further information, please visit [email or web address] PT TOOK OXY 5mg  @ 2:30 pm/   Post Anesthesia Home Care Instructions  Activity: Get plenty of rest for the remainder of the day. A responsible individual must stay with you for 24 hours following the procedure.  For the next  24 hours, DO NOT: -Drive a car -Paediatric nurse -Drink alcoholic beverages -Take any medication unless instructed by your physician -Make any legal decisions or sign important papers.  Meals: Start with liquid foods such as gelatin or soup. Progress to regular foods as tolerated. Avoid greasy, spicy, heavy foods. If nausea and/or vomiting occur, drink only clear liquids until the nausea and/or vomiting subsides. Call your physician if vomiting continues.  Special Instructions/Symptoms: Your throat  may feel dry or sore from the anesthesia or the breathing tube placed in your throat during surgery. If this causes discomfort, gargle with warm salt water. The discomfort should disappear within 24 hours.  If you had a scopolamine patch placed behind your ear for the management of post- operative nausea and/or vomiting:  1. The medication in the patch is effective for 72 hours, after which it should be removed.  Wrap patch in a tissue and discard in the trash. Wash hands thoroughly with soap and water. 2. You may remove the patch earlier than 72 hours if you experience unpleasant side effects which may include dry mouth, dizziness or visual disturbances. 3. Avoid touching the patch. Wash your hands with soap and water after contact with the patch.

## 2017-09-11 NOTE — Interval H&P Note (Signed)
History and Physical Interval Note:  09/11/2017 11:33 AM  Jasmine Allen  has presented today for surgery, with the diagnosis of breast cancer  The various methods of treatment have been discussed with the patient and family. After consideration of risks, benefits and other options for treatment, the patient has consented to  Procedure(s): RE-EXCISION OF RIGHT BREAST CANCER MARGINS (Right) INSERTION PORT-A-CATH WITH [place] (N/A) as a surgical intervention .  The patient's history has been reviewed, patient examined, no change in status, stable for surgery.  I have reviewed the patient's chart and labs.  Questions were answered to the patient's satisfaction.     Rolm Bookbinder
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
## 2017-09-12 ENCOUNTER — Encounter (HOSPITAL_BASED_OUTPATIENT_CLINIC_OR_DEPARTMENT_OTHER): Payer: Self-pay | Admitting: General Surgery

## 2017-09-12 NOTE — Addendum Note (Signed)
Addendum  created 09/12/17 1338 by Naz Denunzio, Ernesta Amble, CRNA   Charge Capture section accepted

## 2017-09-19 ENCOUNTER — Inpatient Hospital Stay: Payer: 59 | Attending: Hematology and Oncology

## 2017-09-19 ENCOUNTER — Ambulatory Visit (HOSPITAL_COMMUNITY)
Admission: RE | Admit: 2017-09-19 | Discharge: 2017-09-19 | Disposition: A | Discharge status: Home / Self Care | Payer: 59 | Source: Ambulatory Visit | Attending: Hematology and Oncology | Admitting: Hematology and Oncology

## 2017-09-19 ENCOUNTER — Encounter: Payer: Self-pay | Admitting: *Deleted

## 2017-09-19 DIAGNOSIS — C50311 Malignant neoplasm of lower-inner quadrant of right female breast: Secondary | ICD-10-CM | POA: Insufficient documentation

## 2017-09-19 DIAGNOSIS — I340 Nonrheumatic mitral (valve) insufficiency: Secondary | ICD-10-CM | POA: Diagnosis not present

## 2017-09-19 DIAGNOSIS — R079 Chest pain, unspecified: Secondary | ICD-10-CM | POA: Insufficient documentation

## 2017-09-19 DIAGNOSIS — Z720 Tobacco use: Secondary | ICD-10-CM | POA: Diagnosis not present

## 2017-09-19 DIAGNOSIS — Z5111 Encounter for antineoplastic chemotherapy: Secondary | ICD-10-CM | POA: Insufficient documentation

## 2017-09-19 DIAGNOSIS — Z170 Estrogen receptor positive status [ER+]: Secondary | ICD-10-CM | POA: Insufficient documentation

## 2017-09-19 DIAGNOSIS — Z5189 Encounter for other specified aftercare: Secondary | ICD-10-CM | POA: Insufficient documentation

## 2017-09-19 NOTE — Progress Notes (Signed)
  Echocardiogram 2D Echocardiogram has been performed.  Jasmine Allen 09/19/2017, 12:42 PM

## 2017-09-20 ENCOUNTER — Encounter: Payer: Self-pay | Admitting: Hematology and Oncology

## 2017-09-20 NOTE — Progress Notes (Signed)
Called pt to introduce myself as her Arboriculturist and to discuss copay assistance.  Pt stated she thinks she has met her out of pocket so her ins should pay for her treatment at 100% but she's going to call them to make sure.  If she hasn't she will meet me on 09/25/17 to apply for copay assistance if the grant(s) are still available.  I informed her of the [employer], went over what it covers and gave her the income requirement.  She informed me her husband exceeds the requirement so she doesn't qualify for the grant.  I also informed her of the [place] and she would like to apply so I will give her an application on 01/07/56 also.

## 2017-09-25 ENCOUNTER — Inpatient Hospital Stay: Payer: 59

## 2017-09-25 ENCOUNTER — Encounter: Payer: Self-pay | Admitting: *Deleted

## 2017-09-25 ENCOUNTER — Inpatient Hospital Stay (HOSPITAL_BASED_OUTPATIENT_CLINIC_OR_DEPARTMENT_OTHER): Payer: 59 | Admitting: Hematology and Oncology

## 2017-09-25 DIAGNOSIS — Z5111 Encounter for antineoplastic chemotherapy: Secondary | ICD-10-CM | POA: Diagnosis not present

## 2017-09-25 DIAGNOSIS — Z170 Estrogen receptor positive status [ER+]: Secondary | ICD-10-CM

## 2017-09-25 DIAGNOSIS — C50311 Malignant neoplasm of lower-inner quadrant of right female breast: Secondary | ICD-10-CM

## 2017-09-25 DIAGNOSIS — Z95828 Presence of other vascular implants and grafts: Secondary | ICD-10-CM

## 2017-09-25 DIAGNOSIS — Z5189 Encounter for other specified aftercare: Secondary | ICD-10-CM | POA: Diagnosis not present

## 2017-09-25 LAB — CMP (CANCER CENTER ONLY)
ALT: 12 U/L (ref 0–55)
AST: 18 U/L (ref 5–34)
Albumin: 4.2 g/dL (ref 3.5–5.0)
Alkaline Phosphatase: 64 U/L (ref 40–150)
Anion gap: 8 (ref 3–11)
BUN: 17 mg/dL (ref 7–26)
CO2: 29 mmol/L (ref 22–29)
Calcium: 9.8 mg/dL (ref 8.4–10.4)
Chloride: 102 mmol/L (ref 98–109)
Creatinine: 0.77 mg/dL (ref 0.60–1.10)
GFR, Est AFR Am: >60 mL/min (ref >60)
GFR, Estimated: >60 mL/min (ref >60)
Glucose, Bld: 101 mg/dL (ref 70–140)
Potassium: 4.4 mmol/L (ref 3.5–5.1)
Sodium: 139 mmol/L (ref 136–145)
Total Bilirubin: 0.3 mg/dL (ref 0.2–1.2)
Total Protein: 7 g/dL (ref 6.4–8.3)

## 2017-09-25 LAB — CBC WITH DIFFERENTIAL (CANCER CENTER ONLY)
Basophils Absolute: 0 10*3/uL (ref 0.0–0.1)
Basophils Relative: 0 %
Eosinophils Absolute: 0.1 10*3/uL (ref 0.0–0.5)
Eosinophils Relative: 2 %
HCT: 40.6 % (ref 34.8–46.6)
Hemoglobin: 14 g/dL (ref 11.6–15.9)
Lymphocytes Relative: 26 %
Lymphs Abs: 2.1 10*3/uL (ref 0.9–3.3)
MCH: 30.7 pg (ref 25.1–34.0)
MCHC: 34.5 g/dL (ref 31.5–36.0)
MCV: 89 fL (ref 79.5–101.0)
Monocytes Absolute: 0.6 10*3/uL (ref 0.1–0.9)
Monocytes Relative: 8 %
Neutro Abs: 5.2 10*3/uL (ref 1.5–6.5)
Neutrophils Relative %: 64 %
Platelet Count: 257 10*3/uL (ref 145–400)
RBC: 4.56 MIL/uL (ref 3.70–5.45)
RDW: 13.6 % (ref 11.2–14.5)
WBC Count: 8.1 10*3/uL (ref 3.9–10.3)

## 2017-09-25 MED ORDER — HEPARIN SOD (PORK) LOCK FLUSH 100 UNIT/ML IV SOLN
500.0000 [IU] | Freq: Once | INTRAVENOUS | Status: AC | PRN
  Administered 2017-09-25: 500 [IU]
  Filled 2017-09-25: qty 5

## 2017-09-25 MED ORDER — DOXORUBICIN HCL CHEMO IV INJECTION 2 MG/ML
60.0000 mg/m2 | Freq: Once | INTRAVENOUS | Status: AC
  Administered 2017-09-25: 88 mg via INTRAVENOUS
  Filled 2017-09-25: qty 44

## 2017-09-25 MED ORDER — PEGFILGRASTIM 6 MG/0.6ML ~~LOC~~ PSKT
PREFILLED_SYRINGE | SUBCUTANEOUS | Status: AC
  Filled 2017-09-25: qty 0.6

## 2017-09-25 MED ORDER — PALONOSETRON HCL INJECTION 0.25 MG/5ML
INTRAVENOUS | Status: AC
  Filled 2017-09-25: qty 5

## 2017-09-25 MED ORDER — SODIUM CHLORIDE 0.9% FLUSH
10.0000 mL | Freq: Once | INTRAVENOUS | Status: AC
  Administered 2017-09-25: 10 mL via INTRAVENOUS
  Filled 2017-09-25: qty 10

## 2017-09-25 MED ORDER — SODIUM CHLORIDE 0.9 % IV SOLN
Freq: Once | INTRAVENOUS | Status: AC
  Administered 2017-09-25: 11:00:00 via INTRAVENOUS
  Filled 2017-09-25: qty 5

## 2017-09-25 MED ORDER — SODIUM CHLORIDE 0.9% FLUSH
10.0000 mL | INTRAVENOUS | Status: DC | PRN
  Administered 2017-09-25: 10 mL
  Filled 2017-09-25: qty 10

## 2017-09-25 MED ORDER — SODIUM CHLORIDE 0.9 % IV SOLN
600.0000 mg/m2 | Freq: Once | INTRAVENOUS | Status: AC
  Administered 2017-09-25: 880 mg via INTRAVENOUS
  Filled 2017-09-25: qty 44

## 2017-09-25 MED ORDER — PALONOSETRON HCL INJECTION 0.25 MG/5ML
0.2500 mg | Freq: Once | INTRAVENOUS | Status: AC
  Administered 2017-09-25: 0.25 mg via INTRAVENOUS

## 2017-09-25 MED ORDER — PEGFILGRASTIM 6 MG/0.6ML ~~LOC~~ PSKT
6.0000 mg | PREFILLED_SYRINGE | Freq: Once | SUBCUTANEOUS | Status: AC
  Administered 2017-09-25: 6 mg via SUBCUTANEOUS

## 2017-09-25 MED ORDER — SODIUM CHLORIDE 0.9 % IV SOLN
Freq: Once | INTRAVENOUS | Status: AC
  Administered 2017-09-25: 10:00:00 via INTRAVENOUS

## 2017-09-25 NOTE — Patient Instructions (Signed)
[facility] Discharge Instructions for Patients Receiving Chemotherapy  Today you received the following chemotherapy agents: Adriamycin, Cytoxan  To help prevent nausea and vomiting after your treatment, we encourage you to take your nausea medication as directed.   If you develop nausea and vomiting that is not controlled by your nausea medication, call the clinic.   BELOW ARE SYMPTOMS THAT SHOULD BE REPORTED IMMEDIATELY:  *FEVER GREATER THAN 100.5 F  *CHILLS WITH OR WITHOUT FEVER  NAUSEA AND VOMITING THAT IS NOT CONTROLLED WITH YOUR NAUSEA MEDICATION  *UNUSUAL SHORTNESS OF BREATH  *UNUSUAL BRUISING OR BLEEDING  TENDERNESS IN MOUTH AND THROAT WITH OR WITHOUT PRESENCE OF ULCERS  *URINARY PROBLEMS  *BOWEL PROBLEMS  UNUSUAL RASH Items with * indicate a potential emergency and should be followed up as soon as possible.  Feel free to call the clinic should you have any questions or concerns. The clinic phone number is [phone].  Please show the CHEMO ALERT CARD at check-in to the Emergency Department and triage nurse.  Doxorubicin injection What is this medicine? DOXORUBICIN (dox oh ROO bi sin) is a chemotherapy drug. It is used to treat many kinds of cancer like leukemia, lymphoma, neuroblastoma, sarcoma, and Wilms' tumor. It is also used to treat bladder cancer, breast cancer, lung cancer, ovarian cancer, stomach cancer, and thyroid cancer. This medicine may be used for other purposes; ask your health care provider or pharmacist if you have questions. COMMON BRAND NAME(S): Adriamycin, Adriamycin PFS, Adriamycin RDF, Rubex What should I tell my health care provider before I take this medicine? They need to know if you have any of these conditions: -heart disease -history of low blood counts caused by a medicine -liver disease -recent or ongoing radiation therapy -an unusual or allergic reaction to doxorubicin, other chemotherapy agents, other medicines,  foods, dyes, or preservatives -pregnant or trying to get pregnant -breast-feeding How should I use this medicine? This drug is given as an infusion into a vein. It is administered in a hospital or clinic by a specially trained health care professional. If you have pain, swelling, burning or any unusual feeling around the site of your injection, tell your health care professional right away. Talk to your pediatrician regarding the use of this medicine in children. Special care may be needed. Overdosage: If you think you have taken too much of this medicine contact a poison control center or emergency room at once. NOTE: This medicine is only for you. Do not share this medicine with others. What if I miss a dose? It is important not to miss your dose. Call your doctor or health care professional if you are unable to keep an appointment. What may interact with this medicine? This medicine may interact with the following medications: -6-mercaptopurine -paclitaxel -phenytoin -St. John's Wort -trastuzumab -verapamil This list may not describe all possible interactions. Give your health care provider a list of all the medicines, herbs, non-prescription drugs, or dietary supplements you use. Also tell them if you smoke, drink alcohol, or use illegal drugs. Some items may interact with your medicine. What should I watch for while using this medicine? This drug may make you feel generally unwell. This is not uncommon, as chemotherapy can affect healthy cells as well as cancer cells. Report any side effects. Continue your course of treatment even though you feel ill unless your doctor tells you to stop. There is a maximum amount of this medicine you should receive throughout your life. The amount depends on the   medical condition being treated and your overall health. Your doctor will watch how much of this medicine you receive in your lifetime. Tell your doctor if you have taken this medicine before. You  may need blood work done while you are taking this medicine. Your urine may turn red for a few days after your dose. This is not blood. If your urine is dark or brown, call your doctor. In some cases, you may be given additional medicines to help with side effects. Follow all directions for their use. Call your doctor or health care professional for advice if you get a fever, chills or sore throat, or other symptoms of a cold or flu. Do not treat yourself. This drug decreases your body's ability to fight infections. Try to avoid being around people who are sick. This medicine may increase your risk to bruise or bleed. Call your doctor or health care professional if you notice any unusual bleeding. Talk to your doctor about your risk of cancer. You may be more at risk for certain types of cancers if you take this medicine. Do not become pregnant while taking this medicine or for 6 months after stopping it. Women should inform their doctor if they wish to become pregnant or think they might be pregnant. Men should not father a child while taking this medicine and for 6 months after stopping it. There is a potential for serious side effects to an unborn child. Talk to your health care professional or pharmacist for more information. Do not breast-feed an infant while taking this medicine. This medicine has caused ovarian failure in some women and reduced sperm counts in some men This medicine may interfere with the ability to have a child. Talk with your doctor or health care professional if you are concerned about your fertility. What side effects may I notice from receiving this medicine? Side effects that you should report to your doctor or health care professional as soon as possible: -allergic reactions like skin rash, itching or hives, swelling of the face, lips, or tongue -breathing problems -chest pain -fast or irregular heartbeat -low blood counts - this medicine may decrease the number of white  blood cells, red blood cells and platelets. You may be at increased risk for infections and bleeding. -pain, redness, or irritation at site where injected -signs of infection - fever or chills, cough, sore throat, pain or difficulty passing urine -signs of decreased platelets or bleeding - bruising, pinpoint red spots on the skin, black, tarry stools, blood in the urine -swelling of the ankles, feet, hands -tiredness -weakness Side effects that usually do not require medical attention (report to your doctor or health care professional if they continue or are bothersome): -diarrhea -hair loss -mouth sores -nail discoloration or damage -nausea -red colored urine -vomiting This list may not describe all possible side effects. Call your doctor for medical advice about side effects. You may report side effects to FDA at 1-800-FDA-1088. Where should I keep my medicine? This drug is given in a hospital or clinic and will not be stored at home. NOTE: This sheet is a summary. It may not cover all possible information. If you have questions about this medicine, talk to your doctor, pharmacist, or health care provider.  2018 Elsevier/Gold Standard (2015-05-24 11:28:51)  Cyclophosphamide injection What is this medicine? CYCLOPHOSPHAMIDE (sye kloe FOSS fa mide) is a chemotherapy drug. It slows the growth of cancer cells. This medicine is used to treat many types of cancer like lymphoma, myeloma,   leukemia, breast cancer, and ovarian cancer, to name a few. This medicine may be used for other purposes; ask your health care provider or pharmacist if you have questions. COMMON BRAND NAME(S): Cytoxan, Neosar What should I tell my health care provider before I take this medicine? They need to know if you have any of these conditions: -blood disorders -history of other chemotherapy -infection -kidney disease -liver disease -recent or ongoing radiation therapy -tumors in the bone marrow -an unusual or  allergic reaction to cyclophosphamide, other chemotherapy, other medicines, foods, dyes, or preservatives -pregnant or trying to get pregnant -breast-feeding How should I use this medicine? This drug is usually given as an injection into a vein or muscle or by infusion into a vein. It is administered in a hospital or clinic by a specially trained health care professional. Talk to your pediatrician regarding the use of this medicine in children. Special care may be needed. Overdosage: If you think you have taken too much of this medicine contact a poison control center or emergency room at once. NOTE: This medicine is only for you. Do not share this medicine with others. What if I miss a dose? It is important not to miss your dose. Call your doctor or health care professional if you are unable to keep an appointment. What may interact with this medicine? This medicine may interact with the following medications: -amiodarone -amphotericin B -azathioprine -certain antiviral medicines for HIV or AIDS such as protease inhibitors (e.g., indinavir, ritonavir) and zidovudine -certain blood pressure medications such as benazepril, captopril, enalapril, fosinopril, lisinopril, moexipril, monopril, perindopril, quinapril, ramipril, trandolapril -certain cancer medications such as anthracyclines (e.g., daunorubicin, doxorubicin), busulfan, cytarabine, paclitaxel, pentostatin, tamoxifen, trastuzumab -certain diuretics such as chlorothiazide, chlorthalidone, hydrochlorothiazide, indapamide, metolazone -certain medicines that treat or prevent blood clots like warfarin -certain muscle relaxants such as succinylcholine -cyclosporine -etanercept -indomethacin -medicines to increase blood counts like filgrastim, pegfilgrastim, sargramostim -medicines used as general anesthesia -metronidazole -natalizumab This list may not describe all possible interactions. Give your health care provider a list of all the  medicines, herbs, non-prescription drugs, or dietary supplements you use. Also tell them if you smoke, drink alcohol, or use illegal drugs. Some items may interact with your medicine. What should I watch for while using this medicine? Visit your doctor for checks on your progress. This drug may make you feel generally unwell. This is not uncommon, as chemotherapy can affect healthy cells as well as cancer cells. Report any side effects. Continue your course of treatment even though you feel ill unless your doctor tells you to stop. Drink water or other fluids as directed. Urinate often, even at night. In some cases, you may be given additional medicines to help with side effects. Follow all directions for their use. Call your doctor or health care professional for advice if you get a fever, chills or sore throat, or other symptoms of a cold or flu. Do not treat yourself. This drug decreases your body's ability to fight infections. Try to avoid being around people who are sick. This medicine may increase your risk to bruise or bleed. Call your doctor or health care professional if you notice any unusual bleeding. Be careful brushing and flossing your teeth or using a toothpick because you may get an infection or bleed more easily. If you have any dental work done, tell your dentist you are receiving this medicine. You may get drowsy or dizzy. Do not drive, use machinery, or do anything that needs mental alertness until   you know how this medicine affects you. Do not become pregnant while taking this medicine or for 1 year after stopping it. Women should inform their doctor if they wish to become pregnant or think they might be pregnant. Men should not father a child while taking this medicine and for 4 months after stopping it. There is a potential for serious side effects to an unborn child. Talk to your health care professional or pharmacist for more information. Do not breast-feed an infant while taking  this medicine. This medicine may interfere with the ability to have a child. This medicine has caused ovarian failure in some women. This medicine has caused reduced sperm counts in some men. You should talk with your doctor or health care professional if you are concerned about your fertility. If you are going to have surgery, tell your doctor or health care professional that you have taken this medicine. What side effects may I notice from receiving this medicine? Side effects that you should report to your doctor or health care professional as soon as possible: -allergic reactions like skin rash, itching or hives, swelling of the face, lips, or tongue -low blood counts - this medicine may decrease the number of white blood cells, red blood cells and platelets. You may be at increased risk for infections and bleeding. -signs of infection - fever or chills, cough, sore throat, pain or difficulty passing urine -signs of decreased platelets or bleeding - bruising, pinpoint red spots on the skin, black, tarry stools, blood in the urine -signs of decreased red blood cells - unusually weak or tired, fainting spells, lightheadedness -breathing problems -dark urine -dizziness -palpitations -swelling of the ankles, feet, hands -trouble passing urine or change in the amount of urine -weight gain -yellowing of the eyes or skin Side effects that usually do not require medical attention (report to your doctor or health care professional if they continue or are bothersome): -changes in nail or skin color -hair loss -missed menstrual periods -mouth sores -nausea, vomiting This list may not describe all possible side effects. Call your doctor for medical advice about side effects. You may report side effects to FDA at 1-800-FDA-1088. Where should I keep my medicine? This drug is given in a hospital or clinic and will not be stored at home. NOTE: This sheet is a summary. It may not cover all possible  information. If you have questions about this medicine, talk to your doctor, pharmacist, or health care provider.  2018 Elsevier/Gold Standard (2012-02-09 16:22:58)     

## 2017-09-25 NOTE — Patient Instructions (Signed)
Implanted Port Home Guide An implanted port is a type of central line that is placed under the skin. Central lines are used to provide IV access when treatment or nutrition needs to be given through a person's veins. Implanted ports are used for long-term IV access. An implanted port may be placed because:  You need IV medicine that would be irritating to the small veins in your hands or arms.  You need long-term IV medicines, such as antibiotics.  You need IV nutrition for a long period.  You need frequent blood draws for lab tests.  You need dialysis.  Implanted ports are usually placed in the chest area, but they can also be placed in the upper arm, the abdomen, or the leg. An implanted port has two main parts:  Reservoir. The reservoir is round and will appear as a small, raised area under your skin. The reservoir is the part where a needle is inserted to give medicines or draw blood.  Catheter. The catheter is a thin, flexible tube that extends from the reservoir. The catheter is placed into a large vein. Medicine that is inserted into the reservoir goes into the catheter and then into the vein.  How will I care for my incision site? Do not get the incision site wet. Bathe or shower as directed by your health care provider. How is my port accessed? Special steps must be taken to access the port:  Before the port is accessed, a numbing cream can be placed on the skin. This helps numb the skin over the port site.  Your health care provider uses a sterile technique to access the port. ? Your health care provider must put on a mask and sterile gloves. ? The skin over your port is cleaned carefully with an antiseptic and allowed to dry. ? The port is gently pinched between sterile gloves, and a needle is inserted into the port.  Only "non-coring" port needles should be used to access the port. Once the port is accessed, a blood return should be checked. This helps ensure that the port  is in the vein and is not clogged.  If your port needs to remain accessed for a constant infusion, a clear (transparent) bandage will be placed over the needle site. The bandage and needle will need to be changed every week, or as directed by your health care provider.  Keep the bandage covering the needle clean and dry. Do not get it wet. Follow your health care provider's instructions on how to take a shower or bath while the port is accessed.  If your port does not need to stay accessed, no bandage is needed over the port.  What is flushing? Flushing helps keep the port from getting clogged. Follow your health care provider's instructions on how and when to flush the port. Ports are usually flushed with saline solution or a medicine called heparin. The need for flushing will depend on how the port is used.  If the port is used for intermittent medicines or blood draws, the port will need to be flushed: ? After medicines have been given. ? After blood has been drawn. ? As part of routine maintenance.  If a constant infusion is running, the port may not need to be flushed.  How long will my port stay implanted? The port can stay in for as long as your health care provider thinks it is needed. When it is time for the port to come out, surgery will be   done to remove it. The procedure is similar to the one performed when the port was put in. When should I seek immediate medical care? When you have an implanted port, you should seek immediate medical care if:  You notice a bad smell coming from the incision site.  You have swelling, redness, or drainage at the incision site.  You have more swelling or pain at the port site or the surrounding area.  You have a fever that is not controlled with medicine.  This information is not intended to replace advice given to you by your health care provider. Make sure you discuss any questions you have with your health care provider. Document  Released: 03/27/2005 Document Revised: 09/02/2015 Document Reviewed: 12/02/2012 Elsevier Interactive Patient Education  2017 Else[employer] Inc.  

## 2017-09-25 NOTE — Progress Notes (Signed)
Discharge instructions printed and verbally reviewed. Patient and spouse verbalized understanding.

## 2017-09-25 NOTE — Assessment & Plan Note (Signed)
08/16/2017: Right lumpectomy: Grade 2 IDC 1.5 cm, with DCIS and necrosis, 0/3 lymph nodes negative, ER 95%, PR 30%, HER-2 negative ratio 1.14, Ki-67 40%, T1CN0 stage Ia Resection of the margins 09/11/2017: Benign  Treatment plan: 1.  Systemic chemotherapy with dose dense Adriamycin and Cytoxan x4 followed by Taxol weekly x12 2. adjuvant radiation therapy 3 followed by adjuvant antiestrogen therapy.  -------------------------------------------------------------------------------- Current treatment: Cycle 1 day 1 dose dense Adriamycin and Cytoxan  Antiemetics were reviewed Labs were reviewed Chemo consent obtained Chemo class completed  Return to clinic in 1 week for toxicity check

## 2017-09-25 NOTE — Progress Notes (Signed)
 Patient Care Team: Kaplan, Kristen W., PA-C as PCP - General (Family Medicine)  DIAGNOSIS:  Encounter Diagnosis  Name Primary?  . Malignant neoplasm of lower-inner quadrant of right breast of female, estrogen receptor positive (HCC)     SUMMARY OF ONCOLOGIC HISTORY:   Malignant neoplasm of lower-inner quadrant of right breast of female, estrogen receptor positive (HCC)   08/16/2017 Initial Diagnosis    Right lumpectomy: Grade 2 IDC 1.5 cm, with DCIS and necrosis, 0/3 lymph nodes negative, ER 95%, PR 30%, HER-2 negative ratio 1.14, Ki-67 40%, T1CN0 stage Ia; resection of the margin 09/11/2017: Benign      08/16/2017 Oncotype testing    Oncotype DX recurrence score 31: 19% risk of recurrence at 9 years.  High risk      09/25/2017 -  Chemotherapy    Adjuvant chemotherapy with dose dense Adriamycin and Cytoxan x4 followed by Taxol weekly x12        CHIEF COMPLIANT: Cycle 1 day 1 dose dense Adriamycin and Cytoxan  INTERVAL HISTORY: Jasmine Allen is a 52-year-old with above-mentioned history of right breast cancer who underwent lumpectomy followed by resection of the margins and is here today to initiate cycle 1 of adjuvant chemotherapy because of high risk Oncotype DX score.  She has recovered from the prior surgeries.  REVIEW OF SYSTEMS:   Constitutional: Denies fevers, chills or abnormal weight loss Eyes: Denies blurriness of vision Ears, nose, mouth, throat, and face: Denies mucositis or sore throat Respiratory: Denies cough, dyspnea or wheezes Cardiovascular: Denies palpitation, chest discomfort Gastrointestinal:  Denies nausea, heartburn or change in bowel habits Skin: Denies abnormal skin rashes Lymphatics: Denies new lymphadenopathy or easy bruising Neurological:Denies numbness, tingling or new weaknesses Behavioral/Psych: Mood is stable, no new changes  Extremities: No lower extremity edema  All other systems were reviewed with the patient and are negative.  I have  reviewed the past medical history, past surgical history, social history and family history with the patient and they are unchanged from previous note.  ALLERGIES:  is allergic to amoxicillin-pot clavulanate; erythromycin; metoclopramide hcl; and sulfonamide derivatives.  MEDICATIONS:  Current Outpatient Medications  Medication Sig Dispense Refill  . ACETAMINOPHEN-BUTALBITAL 50-325 MG TABS Take 1 tablet by mouth daily as needed (for headache).   0  . ALPRAZolam (XANAX) 0.5 MG tablet Take 0.5 mg by mouth at bedtime as needed for anxiety or sleep.     . Biotin 10000 MCG TABS Take 10,000 mcg by mouth daily.     . cetirizine (ZYRTEC) 10 MG tablet Take 10 mg by mouth every morning.     . cloNIDine (CATAPRES) 0.3 MG tablet Take 0.3 mg by mouth at bedtime.     . cyclobenzaprine (FLEXERIL) 10 MG tablet Take 10 mg by mouth every 8 (eight) hours as needed for muscle spasms.  1  . dexamethasone (DECADRON) 4 MG tablet Take 1 tablet (4 mg total) by mouth daily. Take 1 tablet day after chemo and 1 tablet 2 days after chemo with food 8 tablet 0  . furosemide (LASIX) 20 MG tablet Take 20 mg by mouth daily as needed for fluid.  5  . hydrocortisone valerate cream (WESTCORT) 0.2 % Apply 1 application topically daily as needed (eczema).     . hydrOXYzine (ATARAX/VISTARIL) 10 MG tablet Take 10-30 mg by mouth at bedtime as needed for itching.   0  . hyoscyamine (LEVSIN SL) 0.125 MG SL tablet dissolve 1 tablet under the tongue every 4 hours if needed (Patient taking   differently: dissolve 1 tablet under the tongue every 4 hours if needed for stomach) 30 tablet 0  . ibuprofen (ADVIL,MOTRIN) 200 MG tablet Take 400-800 mg by mouth every 6 (six) hours as needed for fever, headache or moderate pain.    Marland Kitchen lidocaine-prilocaine (EMLA) cream Apply to affected area once 30 g 3  . LORazepam (ATIVAN) 0.5 MG tablet Take 1 tablet (0.5 mg total) by mouth at bedtime as needed (Nausea or vomiting). 30 tablet 0  . Melatonin 5 MG CAPS  Take 5 mg by mouth at bedtime as needed (for sleep).     . Multiple Vitamin (MULITIVITAMIN WITH MINERALS) TABS Take 1 tablet by mouth daily.    . ondansetron (ZOFRAN) 8 MG tablet Take 1 tablet (8 mg total) by mouth 2 (two) times daily as needed. Start on the third day after chemotherapy. 30 tablet 1  . pantoprazole (PROTONIX) 40 MG tablet Take 1 tablet (40 mg total) by mouth 2 (two) times daily before a meal. (Patient taking differently: Take 40 mg by mouth daily. ) 60 tablet 6  . prochlorperazine (COMPAZINE) 10 MG tablet Take 1 tablet (10 mg total) by mouth every 6 (six) hours as needed (Nausea or vomiting). 30 tablet 1  . spironolactone (ALDACTONE) 25 MG tablet Take 25 mg by mouth daily.    . traZODone (DESYREL) 100 MG tablet Take 100 mg by mouth at bedtime.     . valACYclovir (VALTREX) 1000 MG tablet Take 1,000 mg by mouth 2 (two) times daily as needed (for out breaks).      No current facility-administered medications for this visit.     PHYSICAL EXAMINATION: ECOG PERFORMANCE STATUS: 1 - Symptomatic but completely ambulatory  Vitals:   09/25/17 0851  BP: (!) 135/94  Pulse: 70  Resp: 17  Temp: 97.7 F (36.5 C)  SpO2: 100%   Filed Weights   09/25/17 0851  Weight: 114 lb 11.2 oz (52 kg)    GENERAL:alert, no distress and comfortable SKIN: skin color, texture, turgor are normal, no rashes or significant lesions EYES: normal, Conjunctiva are pink and non-injected, sclera clear OROPHARYNX:no exudate, no erythema and lips, buccal mucosa, and tongue normal  NECK: supple, thyroid normal size, non-tender, without nodularity LYMPH:  no palpable lymphadenopathy in the cervical, axillary or inguinal LUNGS: clear to auscultation and percussion with normal breathing effort HEART: regular rate & rhythm and no murmurs and no lower extremity edema ABDOMEN:abdomen soft, non-tender and normal bowel sounds MUSCULOSKELETAL:no cyanosis of digits and no clubbing  NEURO: alert & oriented x 3 with  fluent speech, no focal motor/sensory deficits EXTREMITIES: No lower extremity edema  LABORATORY DATA:  I have reviewed the data as listed CMP Latest Ref Rng & Units 08/14/2017 11/19/2016 10/29/2015  Glucose 65 - 99 mg/dL 96 97 95  BUN 6 - 20 mg/dL _0 Creatinine 0.44 - 1.00 mg/dL 0.70 0.87 0.79  Sodium 135 - 145 mmol/L 139 138 139  Potassium 3.5 - 5.1 mmol/L 4.3 3.7 4.6  Chloride 101 - 111 mmol/L 104 106 107  CO2 22 - 32 mmol/L _1 Calcium 8.9 - 10.3 mg/dL 9.5 9.2 9.5  Total Protein 6.5 - 8.1 g/dL - - 6.6  Total Bilirubin 0.3 - 1.2 mg/dL - - 0.2(L)  Alkaline Phos 38 - 126 U/L - - 53  AST 15 - 41 U/L - - 26  ALT 14 - 54 U/L - - 16    Lab Results  Component Value Date  WBC 8.1 09/25/2017   HGB 14.0 09/25/2017   HCT 40.6 09/25/2017   MCV 89.0 09/25/2017   PLT 257 09/25/2017   NEUTROABS 5.2 09/25/2017    ASSESSMENT & PLAN:  Malignant neoplasm of lower-inner quadrant of right breast of female, estrogen receptor positive ([place]) 08/16/2017: Right lumpectomy: Grade 2 IDC 1.5 cm, with DCIS and necrosis, 0/3 lymph nodes negative, ER 95%, PR 30%, HER-2 negative ratio 1.14, Ki-67 40%, T1CN0 stage Ia Resection of the margins 09/11/2017: Benign  Treatment plan: 1.  Systemic chemotherapy with dose dense Adriamycin and Cytoxan x4 followed by Taxol weekly x12 2. adjuvant radiation therapy 3 followed by adjuvant antiestrogen therapy.  -------------------------------------------------------------------------------- Current treatment: Cycle 1 day 1 dose dense Adriamycin and Cytoxan  Antiemetics were reviewed Labs were reviewed Chemo consent obtained Chemo class completed  Return to clinic in 1 week for toxicity check      No orders of the defined types were placed in this encounter.  The patient has a good understanding of the overall plan. she agrees with it. she will call with any problems that may develop before the next visit here.   Harriette Ohara,  MD 09/25/17

## 2017-09-27 ENCOUNTER — Inpatient Hospital Stay: Payer: 59

## 2017-10-02 ENCOUNTER — Inpatient Hospital Stay: Payer: 59

## 2017-10-02 ENCOUNTER — Telehealth: Payer: Self-pay | Admitting: Hematology and Oncology

## 2017-10-02 ENCOUNTER — Inpatient Hospital Stay (HOSPITAL_BASED_OUTPATIENT_CLINIC_OR_DEPARTMENT_OTHER): Payer: 59 | Admitting: Hematology and Oncology

## 2017-10-02 ENCOUNTER — Encounter: Payer: Self-pay | Admitting: *Deleted

## 2017-10-02 ENCOUNTER — Telehealth: Payer: Self-pay

## 2017-10-02 DIAGNOSIS — C50311 Malignant neoplasm of lower-inner quadrant of right female breast: Secondary | ICD-10-CM | POA: Diagnosis not present

## 2017-10-02 DIAGNOSIS — Z5111 Encounter for antineoplastic chemotherapy: Secondary | ICD-10-CM | POA: Diagnosis not present

## 2017-10-02 DIAGNOSIS — Z170 Estrogen receptor positive status [ER+]: Secondary | ICD-10-CM | POA: Diagnosis not present

## 2017-10-02 DIAGNOSIS — Z95828 Presence of other vascular implants and grafts: Secondary | ICD-10-CM

## 2017-10-02 LAB — CBC WITH DIFFERENTIAL (CANCER CENTER ONLY)
Basophils Absolute: 0 10*3/uL (ref 0.0–0.1)
Basophils Relative: 2 %
Eosinophils Absolute: 0.2 10*3/uL (ref 0.0–0.5)
Eosinophils Relative: 10 %
HCT: 38.2 % (ref 34.8–46.6)
Hemoglobin: 12.9 g/dL (ref 11.6–15.9)
Lymphocytes Relative: 73 %
Lymphs Abs: 1.1 10*3/uL (ref 0.9–3.3)
MCH: 30.1 pg (ref 25.1–34.0)
MCHC: 33.7 g/dL (ref 31.5–36.0)
MCV: 89.2 fL (ref 79.5–101.0)
Monocytes Absolute: 0.1 10*3/uL (ref 0.1–0.9)
Monocytes Relative: 7 %
Neutro Abs: 0.1 10*3/uL — CL (ref 1.5–6.5)
Neutrophils Relative %: 8 %
Platelet Count: 151 10*3/uL (ref 145–400)
RBC: 4.29 MIL/uL (ref 3.70–5.45)
RDW: 13.2 % (ref 11.2–14.5)
WBC Count: 1.5 10*3/uL — ABNORMAL LOW (ref 3.9–10.3)

## 2017-10-02 LAB — CMP (CANCER CENTER ONLY)
ALT: 14 U/L (ref 0–44)
AST: 15 U/L (ref 15–41)
Albumin: 4.3 g/dL (ref 3.5–5.0)
Alkaline Phosphatase: 68 U/L (ref 38–126)
Anion gap: 10 (ref 5–15)
BUN: 13 mg/dL (ref 6–20)
CO2: 26 mmol/L (ref 22–32)
Calcium: 10 mg/dL (ref 8.9–10.3)
Chloride: 102 mmol/L (ref 98–111)
Creatinine: 0.69 mg/dL (ref 0.44–1.00)
GFR, Est AFR Am: >60 mL/min (ref >60)
GFR, Estimated: >60 mL/min (ref >60)
Glucose, Bld: 103 mg/dL — ABNORMAL HIGH (ref 70–99)
Potassium: 4 mmol/L (ref 3.5–5.1)
Sodium: 138 mmol/L (ref 135–145)
Total Bilirubin: 0.6 mg/dL (ref 0.3–1.2)
Total Protein: 7.2 g/dL (ref 6.5–8.1)

## 2017-10-02 MED ORDER — SODIUM CHLORIDE 0.9% FLUSH
10.0000 mL | INTRAVENOUS | Status: DC | PRN
  Administered 2017-10-02: 10 mL via INTRAVENOUS
  Filled 2017-10-02: qty 10

## 2017-10-02 MED ORDER — HEPARIN SOD (PORK) LOCK FLUSH 100 UNIT/ML IV SOLN
500.0000 [IU] | Freq: Once | INTRAVENOUS | Status: AC
  Administered 2017-10-02: 500 [IU] via INTRAVENOUS
  Filled 2017-10-02: qty 5

## 2017-10-02 NOTE — Telephone Encounter (Signed)
Received call from lab regarding [place] is 0.1.  Notified Dr Lindi Adie and he gave pt a mask to wear to help reduce her risk of infection.

## 2017-10-02 NOTE — Telephone Encounter (Signed)
No 6/25 los °

## 2017-10-02 NOTE — Assessment & Plan Note (Signed)
08/16/2017: Right lumpectomy: Grade 2 IDC 1.5 cm, with DCIS and necrosis, 0/3 lymph nodes negative, ER 95%, PR 30%, HER-2 negative ratio 1.14, Ki-67 40%, T1CN0 stage Ia Resection of the margins 09/11/2017: Benign  Treatment plan: 1.Systemic chemotherapy with dose dense Adriamycin and Cytoxan x4 followed by Taxol weekly x12 2. adjuvant radiation therapy 3 followed by adjuvant antiestrogen therapy. -------------------------------------------------------------------------------- Current treatment: Cycle 1 day 8 dose dense Adriamycin and Cytoxan  Chemo toxicities:  Monitoring closely for chemo toxicities Return to clinic in 1 week for cycle 2

## 2017-10-02 NOTE — Progress Notes (Signed)
Patient Care Team: [name] as PCP - General (Family Medicine)  DIAGNOSIS:  Encounter Diagnosis  Name Primary?  . Malignant neoplasm of lower-inner quadrant of right breast of female, estrogen receptor positive ([place])     SUMMARY OF ONCOLOGIC HISTORY:   Malignant neoplasm of lower-inner quadrant of right breast of female, estrogen receptor positive ([place])   08/16/2017 Initial Diagnosis    Right lumpectomy: Grade 2 IDC 1.5 cm, with DCIS and necrosis, 0/3 lymph nodes negative, ER 95%, PR 30%, HER-2 negative ratio 1.14, Ki-67 40%, T1CN0 stage Ia; resection of the margin 09/11/2017: Benign      08/16/2017 Oncotype testing    Oncotype DX recurrence score 31: 19% risk of recurrence at 9 years.  High risk      09/25/2017 -  Chemotherapy    Adjuvant chemotherapy with dose dense Adriamycin and Cytoxan x4 followed by Taxol weekly x12        CHIEF COMPLIANT: Cycle 1 day 8 dose dense Adriamycin and Cytoxan  INTERVAL HISTORY: Jasmine Allen is a 52 year old with above-mentioned history of right breast cancer currently on adjuvant chemotherapy and today's cycle 1 day 8 of dose dense Adriamycin and Cytoxan.  Overall she tolerated chemotherapy fairly well.  She did not have any nausea vomiting.  She complained of right finger numbness in the thumb index and middle fingers.  But she had carpal tunnel syndrome previously.  REVIEW OF SYSTEMS:   Constitutional: Denies fevers, chills or abnormal weight loss Eyes: Denies blurriness of vision Ears, nose, mouth, throat, and face: Denies mucositis or sore throat Respiratory: Denies cough, dyspnea or wheezes Cardiovascular: Denies palpitation, chest discomfort Gastrointestinal:  Denies nausea, heartburn or change in bowel habits Skin: Denies abnormal skin rashes Lymphatics: Denies new lymphadenopathy or easy bruising Neurological: Numbness of the right fingers Behavioral/Psych: Mood is stable, no new changes  Extremities: No lower  extremity edema  All other systems were reviewed with the patient and are negative.  I have reviewed the past medical history, past surgical history, social history and family history with the patient and they are unchanged from previous note.  ALLERGIES:  is allergic to amoxicillin-pot clavulanate; erythromycin; metoclopramide hcl; and sulfonamide derivatives.  MEDICATIONS:  Current Outpatient Medications  Medication Sig Dispense Refill  . ACETAMINOPHEN-BUTALBITAL 50-325 MG TABS Take 1 tablet by mouth daily as needed (for headache).   0  . ALPRAZolam (XANAX) 0.5 MG tablet Take 0.5 mg by mouth at bedtime as needed for anxiety or sleep.     . Biotin 10000 MCG TABS Take 10,000 mcg by mouth daily.     . cetirizine (ZYRTEC) 10 MG tablet Take 10 mg by mouth every morning.     . cloNIDine (CATAPRES) 0.3 MG tablet Take 0.3 mg by mouth at bedtime.     . cyclobenzaprine (FLEXERIL) 10 MG tablet Take 10 mg by mouth every 8 (eight) hours as needed for muscle spasms.  1  . dexamethasone (DECADRON) 4 MG tablet Take 1 tablet (4 mg total) by mouth daily. Take 1 tablet day after chemo and 1 tablet 2 days after chemo with food 8 tablet 0  . furosemide (LASIX) 20 MG tablet Take 20 mg by mouth daily as needed for fluid.  5  . hydrocortisone valerate cream (WESTCORT) 0.2 % Apply 1 application topically daily as needed (eczema).     . hydrOXYzine (ATARAX/VISTARIL) 10 MG tablet Take 10-30 mg by mouth at bedtime as needed for itching.   0  . hyoscyamine (LEVSIN  SL) 0.125 MG SL tablet dissolve 1 tablet under the tongue every 4 hours if needed (Patient taking differently: dissolve 1 tablet under the tongue every 4 hours if needed for stomach) 30 tablet 0  . ibuprofen (ADVIL,MOTRIN) 200 MG tablet Take 400-800 mg by mouth every 6 (six) hours as needed for fever, headache or moderate pain.    Marland Kitchen lidocaine-prilocaine (EMLA) cream Apply to affected area once 30 g 3  . LORazepam (ATIVAN) 0.5 MG tablet Take 1 tablet (0.5 mg  total) by mouth at bedtime as needed (Nausea or vomiting). 30 tablet 0  . Melatonin 5 MG CAPS Take 5 mg by mouth at bedtime as needed (for sleep).     . Multiple Vitamin (MULITIVITAMIN WITH MINERALS) TABS Take 1 tablet by mouth daily.    . ondansetron (ZOFRAN) 8 MG tablet Take 1 tablet (8 mg total) by mouth 2 (two) times daily as needed. Start on the third day after chemotherapy. 30 tablet 1  . pantoprazole (PROTONIX) 40 MG tablet Take 1 tablet (40 mg total) by mouth 2 (two) times daily before a meal. (Patient taking differently: Take 40 mg by mouth daily. ) 60 tablet 6  . prochlorperazine (COMPAZINE) 10 MG tablet Take 1 tablet (10 mg total) by mouth every 6 (six) hours as needed (Nausea or vomiting). 30 tablet 1  . spironolactone (ALDACTONE) 25 MG tablet Take 25 mg by mouth daily.    . traZODone (DESYREL) 100 MG tablet Take 100 mg by mouth at bedtime.     . valACYclovir (VALTREX) 1000 MG tablet Take 1,000 mg by mouth 2 (two) times daily as needed (for out breaks).      No current facility-administered medications for this visit.     PHYSICAL EXAMINATION: ECOG PERFORMANCE STATUS: 1 - Symptomatic but completely ambulatory  Vitals:   10/02/17 1334  BP: (!) 147/88  Pulse: (!) 103  Resp: 17  Temp: 98.3 F (36.8 C)  SpO2: 99%   Filed Weights   10/02/17 1334  Weight: 114 lb 4.8 oz (51.8 kg)    GENERAL:alert, no distress and comfortable SKIN: skin color, texture, turgor are normal, no rashes or significant lesions EYES: normal, Conjunctiva are pink and non-injected, sclera clear OROPHARYNX:no exudate, no erythema and lips, buccal mucosa, and tongue normal  NECK: supple, thyroid normal size, non-tender, without nodularity LYMPH:  no palpable lymphadenopathy in the cervical, axillary or inguinal LUNGS: clear to auscultation and percussion with normal breathing effort HEART: regular rate & rhythm and no murmurs and no lower extremity edema ABDOMEN:abdomen soft, non-tender and normal bowel  sounds MUSCULOSKELETAL:no cyanosis of digits and no clubbing  NEURO: alert & oriented x 3 with fluent speech, no focal motor/sensory deficits EXTREMITIES: No lower extremity edema   LABORATORY DATA:  I have reviewed the data as listed CMP Latest Ref Rng & Units 09/25/2017 08/14/2017 11/19/2016  Glucose 70 - 140 mg/dL 101 96 97  BUN 7 - 26 mg/dL _0 Creatinine 0.60 - 1.10 mg/dL 0.77 0.70 0.87  Sodium 136 - 145 mmol/L 139 139 138  Potassium 3.5 - 5.1 mmol/L 4.4 4.3 3.7  Chloride 98 - 109 mmol/L 102 104 106  CO2 22 - 29 mmol/L _1 Calcium 8.4 - 10.4 mg/dL 9.8 9.5 9.2  Total Protein 6.4 - 8.3 g/dL 7.0 - -  Total Bilirubin 0.2 - 1.2 mg/dL 0.3 - -  Alkaline Phos 40 - 150 U/L 64 - -  AST 5 - 34 U/L 18 - -  ALT 0 - 55 U/L 12 - -    Lab Results  Component Value Date   WBC 1.5 (L) 10/02/2017   HGB 12.9 10/02/2017   HCT 38.2 10/02/2017   MCV 89.2 10/02/2017   PLT 151 10/02/2017   NEUTROABS 0.1 (LL) 10/02/2017    ASSESSMENT & PLAN:  Malignant neoplasm of lower-inner quadrant of right breast of female, estrogen receptor positive ([place]) 08/16/2017: Right lumpectomy: Grade 2 IDC 1.5 cm, with DCIS and necrosis, 0/3 lymph nodes negative, ER 95%, PR 30%, HER-2 negative ratio 1.14, Ki-67 40%, T1CN0 stage Ia Resection of the margins 09/11/2017: Benign  Treatment plan: 1.Systemic chemotherapy with dose dense Adriamycin and Cytoxan x4 followed by Taxol weekly x12 2. adjuvant radiation therapy 3 followed by adjuvant antiestrogen therapy. -------------------------------------------------------------------------------- Current treatment: Cycle 1 day 8 dose dense Adriamycin and Cytoxan  Chemo toxicities: ANC 0.1: I decreased the dosage of cycle 2 of chemotherapy. Otherwise patient tolerated chemo extremely well. I gave her neutropenic precautions. Monitoring closely for chemo toxicities Return to clinic in 1 week for cycle 2  No orders of the defined types were placed in this  encounter.  The patient has a good understanding of the overall plan. she agrees with it. she will call with any problems that may develop before the next visit here.   Harriette Ohara, MD 10/02/17

## 2017-10-04 ENCOUNTER — Telehealth: Payer: Self-pay

## 2017-10-04 NOTE — Telephone Encounter (Signed)
Returned pt vm regarding complaints of severe bone pain from neulasta. Pt states that she was in to see Dr.Gudena 2 days ago and was feeling ok. She woke up this morning with pain in her lower back and legs. Pt instructed to keep taking Claritin daily, along with OTC analgesic every 4-6hrs as needed. Pt may also use a heating pack and apply over the affected area of/on for 20 min. Pt last ANC, 2 days ago was 0.1. Told pt that she needs to practice good hand hygiene, drink plenty of fluids, check her temperature, and know when to go seek urgent care. Pt verbalized understanding and knows to call for any more concerns.

## 2017-10-09 ENCOUNTER — Inpatient Hospital Stay: Payer: 59

## 2017-10-09 ENCOUNTER — Encounter: Payer: Self-pay | Admitting: *Deleted

## 2017-10-09 ENCOUNTER — Inpatient Hospital Stay: Payer: 59 | Attending: Hematology and Oncology

## 2017-10-09 ENCOUNTER — Inpatient Hospital Stay (HOSPITAL_BASED_OUTPATIENT_CLINIC_OR_DEPARTMENT_OTHER): Payer: 59 | Admitting: Hematology and Oncology

## 2017-10-09 DIAGNOSIS — Z5189 Encounter for other specified aftercare: Secondary | ICD-10-CM | POA: Diagnosis not present

## 2017-10-09 DIAGNOSIS — Z170 Estrogen receptor positive status [ER+]: Secondary | ICD-10-CM

## 2017-10-09 DIAGNOSIS — Z5111 Encounter for antineoplastic chemotherapy: Secondary | ICD-10-CM | POA: Diagnosis not present

## 2017-10-09 DIAGNOSIS — C50311 Malignant neoplasm of lower-inner quadrant of right female breast: Secondary | ICD-10-CM

## 2017-10-09 DIAGNOSIS — M898X9 Other specified disorders of bone, unspecified site: Secondary | ICD-10-CM

## 2017-10-09 LAB — CBC WITH DIFFERENTIAL (CANCER CENTER ONLY)
Basophils Absolute: 0.1 10*3/uL (ref 0.0–0.1)
Basophils Relative: 0 %
Eosinophils Absolute: 0.1 10*3/uL (ref 0.0–0.5)
Eosinophils Relative: 1 %
HCT: 34.8 % (ref 34.8–46.6)
Hemoglobin: 11.8 g/dL (ref 11.6–15.9)
Lymphocytes Relative: 15 %
Lymphs Abs: 2.2 10*3/uL (ref 0.9–3.3)
MCH: 30.3 pg (ref 25.1–34.0)
MCHC: 33.9 g/dL (ref 31.5–36.0)
MCV: 89.5 fL (ref 79.5–101.0)
Monocytes Absolute: 1.3 10*3/uL — ABNORMAL HIGH (ref 0.1–0.9)
Monocytes Relative: 9 %
Neutro Abs: 11.2 10*3/uL — ABNORMAL HIGH (ref 1.5–6.5)
Neutrophils Relative %: 75 %
Platelet Count: 174 10*3/uL (ref 145–400)
RBC: 3.89 MIL/uL (ref 3.70–5.45)
RDW: 14 % (ref 11.2–14.5)
WBC Count: 14.8 10*3/uL — ABNORMAL HIGH (ref 3.9–10.3)

## 2017-10-09 LAB — CMP (CANCER CENTER ONLY)
ALT: 16 U/L (ref 0–44)
AST: 19 U/L (ref 15–41)
Albumin: 4.1 g/dL (ref 3.5–5.0)
Alkaline Phosphatase: 89 U/L (ref 38–126)
Anion gap: 6 (ref 5–15)
BUN: 12 mg/dL (ref 6–20)
CO2: 28 mmol/L (ref 22–32)
Calcium: 9.5 mg/dL (ref 8.9–10.3)
Chloride: 105 mmol/L (ref 98–111)
Creatinine: 0.72 mg/dL (ref 0.44–1.00)
GFR, Est AFR Am: >60 mL/min (ref >60)
GFR, Estimated: >60 mL/min (ref >60)
Glucose, Bld: 91 mg/dL (ref 70–99)
Potassium: 4 mmol/L (ref 3.5–5.1)
Sodium: 139 mmol/L (ref 135–145)
Total Bilirubin: <0.2 mg/dL — ABNORMAL LOW (ref 0.3–1.2)
Total Protein: 6.6 g/dL (ref 6.5–8.1)

## 2017-10-09 MED ORDER — PEGFILGRASTIM 6 MG/0.6ML ~~LOC~~ PSKT
PREFILLED_SYRINGE | SUBCUTANEOUS | Status: AC
Start: 2017-10-09 — End: ?
  Filled 2017-10-09: qty 0.6

## 2017-10-09 MED ORDER — PALONOSETRON HCL INJECTION 0.25 MG/5ML
0.2500 mg | Freq: Once | INTRAVENOUS | Status: AC
  Administered 2017-10-09: 0.25 mg via INTRAVENOUS

## 2017-10-09 MED ORDER — HEPARIN SOD (PORK) LOCK FLUSH 100 UNIT/ML IV SOLN
500.0000 [IU] | Freq: Once | INTRAVENOUS | Status: AC | PRN
  Administered 2017-10-09: 500 [IU]
  Filled 2017-10-09: qty 5

## 2017-10-09 MED ORDER — DOXORUBICIN HCL CHEMO IV INJECTION 2 MG/ML
50.0000 mg/m2 | Freq: Once | INTRAVENOUS | Status: AC
  Administered 2017-10-09: 74 mg via INTRAVENOUS
  Filled 2017-10-09: qty 37

## 2017-10-09 MED ORDER — PALONOSETRON HCL INJECTION 0.25 MG/5ML
INTRAVENOUS | Status: AC
Start: 2017-10-09 — End: ?
  Filled 2017-10-09: qty 5

## 2017-10-09 MED ORDER — SODIUM CHLORIDE 0.9% FLUSH
10.0000 mL | INTRAVENOUS | Status: DC | PRN
  Administered 2017-10-09: 10 mL
  Filled 2017-10-09: qty 10

## 2017-10-09 MED ORDER — PEGFILGRASTIM 6 MG/0.6ML ~~LOC~~ PSKT
6.0000 mg | PREFILLED_SYRINGE | Freq: Once | SUBCUTANEOUS | Status: AC
  Administered 2017-10-09: 6 mg via SUBCUTANEOUS

## 2017-10-09 MED ORDER — TRAMADOL HCL 50 MG PO TABS: 50.0000 mg | ORAL_TABLET | Freq: Four times a day (QID) | ORAL | 0 refills | Status: DC | PRN

## 2017-10-09 MED ORDER — SODIUM CHLORIDE 0.9 % IV SOLN
Freq: Once | INTRAVENOUS | Status: AC
  Administered 2017-10-09: 13:00:00 via INTRAVENOUS

## 2017-10-09 MED ORDER — SODIUM CHLORIDE 0.9% FLUSH
10.0000 mL | INTRAVENOUS | Status: DC | PRN
  Administered 2017-10-09: 10 mL via INTRAVENOUS
  Filled 2017-10-09: qty 10

## 2017-10-09 MED ORDER — SODIUM CHLORIDE 0.9 % IV SOLN
500.0000 mg/m2 | Freq: Once | INTRAVENOUS | Status: AC
  Administered 2017-10-09: 740 mg via INTRAVENOUS
  Filled 2017-10-09: qty 37

## 2017-10-09 MED ORDER — SODIUM CHLORIDE 0.9 % IV SOLN
Freq: Once | INTRAVENOUS | Status: AC
  Administered 2017-10-09: 13:00:00 via INTRAVENOUS
  Filled 2017-10-09: qty 5

## 2017-10-09 NOTE — Patient Instructions (Signed)
[facility] Discharge Instructions for Patients Receiving Chemotherapy  Today you received the following chemotherapy agents: Adriamycin, Cytoxan  To help prevent nausea and vomiting after your treatment, we encourage you to take your nausea medication as directed.   If you develop nausea and vomiting that is not controlled by your nausea medication, call the clinic.   BELOW ARE SYMPTOMS THAT SHOULD BE REPORTED IMMEDIATELY:  *FEVER GREATER THAN 100.5 F  *CHILLS WITH OR WITHOUT FEVER  NAUSEA AND VOMITING THAT IS NOT CONTROLLED WITH YOUR NAUSEA MEDICATION  *UNUSUAL SHORTNESS OF BREATH  *UNUSUAL BRUISING OR BLEEDING  TENDERNESS IN MOUTH AND THROAT WITH OR WITHOUT PRESENCE OF ULCERS  *URINARY PROBLEMS  *BOWEL PROBLEMS  UNUSUAL RASH Items with * indicate a potential emergency and should be followed up as soon as possible.  Feel free to call the clinic should you have any questions or concerns. The clinic phone number is [phone].  Please show the CHEMO ALERT CARD at check-in to the Emergency Department and triage nurse.   

## 2017-10-09 NOTE — Progress Notes (Signed)
Patient Care Team: [name] as PCP - General (Family Medicine)  DIAGNOSIS:  Encounter Diagnosis  Name Primary?  . Malignant neoplasm of lower-inner quadrant of right breast of female, estrogen receptor positive ([place])     SUMMARY OF ONCOLOGIC HISTORY:   Malignant neoplasm of lower-inner quadrant of right breast of female, estrogen receptor positive ([place])   08/16/2017 Initial Diagnosis    Right lumpectomy: Grade 2 IDC 1.5 cm, with DCIS and necrosis, 0/3 lymph nodes negative, ER 95%, PR 30%, HER-2 negative ratio 1.14, Ki-67 40%, T1CN0 stage Ia; resection of the margin 09/11/2017: Benign      08/16/2017 Oncotype testing    Oncotype DX recurrence score 31: 19% risk of recurrence at 9 years.  High risk      09/25/2017 -  Chemotherapy    Adjuvant chemotherapy with dose dense Adriamycin and Cytoxan x4 followed by Taxol weekly x12        CHIEF COMPLIANT: Cycle 2 dose dense Adriamycin and Cytoxan  INTERVAL HISTORY: Jasmine Allen is a 52 year old with above-mentioned history of right breast cancer treated with lumpectomy and is currently on adjuvant chemotherapy.  Today cycle 2 of dose dense Adriamycin and Cytoxan.  She tolerated cycle 1 extremely well.  She did not have any nausea vomiting or fatigue. She had complained of diffuse bone pains that persisted in spite of Tylenol, Motrin and Claritin.  Even yesterday she was complaining of pain and discomfort.  REVIEW OF SYSTEMS:   Constitutional: Denies fevers, chills or abnormal weight loss Eyes: Denies blurriness of vision Ears, nose, mouth, throat, and face: Denies mucositis or sore throat Respiratory: Denies cough, dyspnea or wheezes Cardiovascular: Denies palpitation, chest discomfort Gastrointestinal:  Denies nausea, heartburn or change in bowel habits Skin: Denies abnormal skin rashes Lymphatics: Denies new lymphadenopathy or easy bruising Neurological:Denies numbness, tingling or new weaknesses Behavioral/Psych:  Mood is stable, no new changes  Extremities: No lower extremity edema Breast:  denies any pain or lumps or nodules in either breasts All other systems were reviewed with the patient and are negative.  I have reviewed the past medical history, past surgical history, social history and family history with the patient and they are unchanged from previous note.  ALLERGIES:  is allergic to amoxicillin-pot clavulanate; erythromycin; metoclopramide hcl; and sulfonamide derivatives.  MEDICATIONS:  Current Outpatient Medications  Medication Sig Dispense Refill  . ACETAMINOPHEN-BUTALBITAL 50-325 MG TABS Take 1 tablet by mouth daily as needed (for headache).   0  . ALPRAZolam (XANAX) 0.5 MG tablet Take 0.5 mg by mouth at bedtime as needed for anxiety or sleep.     . Biotin 10000 MCG TABS Take 10,000 mcg by mouth daily.     . cetirizine (ZYRTEC) 10 MG tablet Take 10 mg by mouth every morning.     . cloNIDine (CATAPRES) 0.3 MG tablet Take 0.3 mg by mouth at bedtime.     . cyclobenzaprine (FLEXERIL) 10 MG tablet Take 10 mg by mouth every 8 (eight) hours as needed for muscle spasms.  1  . furosemide (LASIX) 20 MG tablet Take 20 mg by mouth daily as needed for fluid.  5  . hydrocortisone valerate cream (WESTCORT) 0.2 % Apply 1 application topically daily as needed (eczema).     . hydrOXYzine (ATARAX/VISTARIL) 10 MG tablet Take 10-30 mg by mouth at bedtime as needed for itching.   0  . hyoscyamine (LEVSIN SL) 0.125 MG SL tablet dissolve 1 tablet under the tongue every 4 hours if needed (  Patient taking differently: dissolve 1 tablet under the tongue every 4 hours if needed for stomach) 30 tablet 0  . ibuprofen (ADVIL,MOTRIN) 200 MG tablet Take 400-800 mg by mouth every 6 (six) hours as needed for fever, headache or moderate pain.    Marland Kitchen lidocaine-prilocaine (EMLA) cream Apply to affected area once 30 g 3  . LORazepam (ATIVAN) 0.5 MG tablet Take 1 tablet (0.5 mg total) by mouth at bedtime as needed (Nausea or  vomiting). 30 tablet 0  . Melatonin 5 MG CAPS Take 5 mg by mouth at bedtime as needed (for sleep).     . Multiple Vitamin (MULITIVITAMIN WITH MINERALS) TABS Take 1 tablet by mouth daily.    . ondansetron (ZOFRAN) 8 MG tablet Take 1 tablet (8 mg total) by mouth 2 (two) times daily as needed. Start on the third day after chemotherapy. 30 tablet 1  . pantoprazole (PROTONIX) 40 MG tablet Take 1 tablet (40 mg total) by mouth 2 (two) times daily before a meal. (Patient taking differently: Take 40 mg by mouth daily. ) 60 tablet 6  . prochlorperazine (COMPAZINE) 10 MG tablet Take 1 tablet (10 mg total) by mouth every 6 (six) hours as needed (Nausea or vomiting). 30 tablet 1  . spironolactone (ALDACTONE) 25 MG tablet Take 25 mg by mouth daily.    . traMADol (ULTRAM) 50 MG tablet Take 1 tablet (50 mg total) by mouth every 6 (six) hours as needed. 20 tablet 0  . traZODone (DESYREL) 100 MG tablet Take 100 mg by mouth at bedtime.     . valACYclovir (VALTREX) 1000 MG tablet Take 1,000 mg by mouth 2 (two) times daily as needed (for out breaks).      No current facility-administered medications for this visit.    Facility-Administered Medications Ordered in Other Visits  Medication Dose Route Frequency Provider Last Rate Last Dose  . 0.9 %  sodium chloride infusion   Intravenous Once Nicholas Lose, MD      . cyclophosphamide (CYTOXAN) 740 mg in sodium chloride 0.9 % 250 mL chemo infusion  500 mg/m2 (Treatment Plan Recorded) Intravenous Once Nicholas Lose, MD      . DOXOrubicin (ADRIAMYCIN) chemo injection 74 mg  50 mg/m2 (Treatment Plan Recorded) Intravenous Once Nicholas Lose, MD      . heparin lock flush 100 unit/mL  500 Units Intracatheter Once PRN Nicholas Lose, MD      . pegfilgrastim (NEULASTA ONPRO KIT) injection 6 mg  6 mg Subcutaneous Once Nicholas Lose, MD      . sodium chloride flush (NS) 0.9 % injection 10 mL  10 mL Intracatheter PRN Nicholas Lose, MD        PHYSICAL EXAMINATION: ECOG PERFORMANCE  STATUS: 1 - Symptomatic but completely ambulatory  Vitals:   10/09/17 1207  BP: 132/88  Pulse: 75  Resp: 18  Temp: 97.8 F (36.6 C)  SpO2: 100%   Filed Weights   10/09/17 1207  Weight: 116 lb 11.2 oz (52.9 kg)    GENERAL:alert, no distress and comfortable SKIN: skin color, texture, turgor are normal, no rashes or significant lesions EYES: normal, Conjunctiva are pink and non-injected, sclera clear OROPHARYNX:no exudate, no erythema and lips, buccal mucosa, and tongue normal  NECK: supple, thyroid normal size, non-tender, without nodularity LYMPH:  no palpable lymphadenopathy in the cervical, axillary or inguinal LUNGS: clear to auscultation and percussion with normal breathing effort HEART: regular rate & rhythm and no murmurs and no lower extremity edema ABDOMEN:abdomen soft, non-tender and normal  bowel sounds MUSCULOSKELETAL:no cyanosis of digits and no clubbing  NEURO: alert & oriented x 3 with fluent speech, no focal motor/sensory deficits EXTREMITIES: No lower extremity edema  He has LABORATORY DATA:  I have reviewed the data as listed CMP Latest Ref Rng & Units 10/09/2017 10/02/2017 09/25/2017  Glucose 70 - 99 mg/dL 91 103(H) 101  BUN 6 - 20 mg/dL _0 Creatinine 0.44 - 1.00 mg/dL 0.72 0.69 0.77  Sodium 135 - 145 mmol/L 139 138 139  Potassium 3.5 - 5.1 mmol/L 4.0 4.0 4.4  Chloride 98 - 111 mmol/L 105 102 102  CO2 22 - 32 mmol/L _1 Calcium 8.9 - 10.3 mg/dL 9.5 10.0 9.8  Total Protein 6.5 - 8.1 g/dL 6.6 7.2 7.0  Total Bilirubin 0.3 - 1.2 mg/dL <0.2(L) 0.6 0.3  Alkaline Phos 38 - 126 U/L 89 68 64  AST 15 - 41 U/L _2 ALT 0 - 44 U/L _3 Lab Results  Component Value Date   WBC 14.8 (H) 10/09/2017   HGB 11.8 10/09/2017   HCT 34.8 10/09/2017   MCV 89.5 10/09/2017   PLT 174 10/09/2017   NEUTROABS 11.2 (H) 10/09/2017    ASSESSMENT & PLAN:  Malignant neoplasm of lower-inner quadrant of right breast of female, estrogen receptor positive  ([place]) 08/16/2017: Right lumpectomy: Grade 2 IDC 1.5 cm, with DCIS and necrosis, 0/3 lymph nodes negative, ER 95%, PR 30%, HER-2 negative ratio 1.14, Ki-67 40%, T1CN0 stage Ia Resection of the margins 09/11/2017: Benign  Treatment plan: 1.Systemic chemotherapy with dose dense Adriamycin and Cytoxan x4 followed by Taxol weekly x12 2. adjuvant radiation therapy 3 followed by adjuvant antiestrogen therapy. -------------------------------------------------------------------------------- Current treatment: Cycle 2 day 1 dose dense Adriamycin and Cytoxan  Chemo toxicities: 1.  Low neutrophil count after cycle 1: We reduce the dosage of cycle 2 2. bone pain due to Neulasta I sent a prescription for tramadol. Otherwise no side effects from chemotherapy.  Monitoring closely for chemo toxicities Return to clinic in 2 weeks for cycle 3  No orders of the defined types were placed in this encounter.  The patient has a good understanding of the overall plan. she agrees with it. she will call with any problems that may develop before the next visit here.   Harriette Ohara, MD 10/09/17

## 2017-10-09 NOTE — Assessment & Plan Note (Signed)
08/16/2017: Right lumpectomy: Grade 2 IDC 1.5 cm, with DCIS and necrosis, 0/3 lymph nodes negative, ER 95%, PR 30%, HER-2 negative ratio 1.14, Ki-67 40%, T1CN0 stage Ia Resection of the margins 09/11/2017: Benign  Treatment plan: 1.Systemic chemotherapy with dose dense Adriamycin and Cytoxan x4 followed by Taxol weekly x12 2. adjuvant radiation therapy 3 followed by adjuvant antiestrogen therapy. -------------------------------------------------------------------------------- Current treatment: Cycle 2 day 1 dose dense Adriamycin and Cytoxan  Chemo toxicities: 1.  Low neutrophil count after cycle 1: We reduce the dosage of cycle 2 Otherwise no side effects from chemotherapy.  Monitoring closely for chemo toxicities Return to clinic in 2 weeks for cycle 3

## 2017-10-12 ENCOUNTER — Ambulatory Visit: Payer: 59

## 2017-10-22 ENCOUNTER — Telehealth: Payer: Self-pay | Admitting: Hematology and Oncology

## 2017-10-22 ENCOUNTER — Other Ambulatory Visit: Payer: Self-pay | Admitting: Hematology and Oncology

## 2017-10-22 DIAGNOSIS — C50311 Malignant neoplasm of lower-inner quadrant of right female breast: Secondary | ICD-10-CM

## 2017-10-22 DIAGNOSIS — Z170 Estrogen receptor positive status [ER+]: Principal | ICD-10-CM

## 2017-10-22 NOTE — Telephone Encounter (Signed)
Patient called to confirm 7/16 appts.

## 2017-10-23 ENCOUNTER — Encounter: Payer: Self-pay | Admitting: *Deleted

## 2017-10-23 ENCOUNTER — Inpatient Hospital Stay: Payer: 59

## 2017-10-23 ENCOUNTER — Inpatient Hospital Stay (HOSPITAL_BASED_OUTPATIENT_CLINIC_OR_DEPARTMENT_OTHER): Payer: 59 | Admitting: Hematology and Oncology

## 2017-10-23 DIAGNOSIS — Z5111 Encounter for antineoplastic chemotherapy: Secondary | ICD-10-CM | POA: Diagnosis not present

## 2017-10-23 DIAGNOSIS — Z170 Estrogen receptor positive status [ER+]: Secondary | ICD-10-CM

## 2017-10-23 DIAGNOSIS — M898X9 Other specified disorders of bone, unspecified site: Secondary | ICD-10-CM | POA: Diagnosis not present

## 2017-10-23 DIAGNOSIS — C50311 Malignant neoplasm of lower-inner quadrant of right female breast: Secondary | ICD-10-CM

## 2017-10-23 DIAGNOSIS — Z95828 Presence of other vascular implants and grafts: Secondary | ICD-10-CM

## 2017-10-23 LAB — CBC WITH DIFFERENTIAL (CANCER CENTER ONLY)
Basophils Absolute: 0.1 10*3/uL (ref 0.0–0.1)
Basophils Relative: 1 %
Eosinophils Absolute: 0.1 10*3/uL (ref 0.0–0.5)
Eosinophils Relative: 0 %
HCT: 34.7 % — ABNORMAL LOW (ref 34.8–46.6)
Hemoglobin: 11.8 g/dL (ref 11.6–15.9)
Lymphocytes Relative: 9 %
Lymphs Abs: 1.4 10*3/uL (ref 0.9–3.3)
MCH: 30.5 pg (ref 25.1–34.0)
MCHC: 34.1 g/dL (ref 31.5–36.0)
MCV: 89.5 fL (ref 79.5–101.0)
Monocytes Absolute: 1 10*3/uL — ABNORMAL HIGH (ref 0.1–0.9)
Monocytes Relative: 7 %
Neutro Abs: 12.1 10*3/uL — ABNORMAL HIGH (ref 1.5–6.5)
Neutrophils Relative %: 83 %
Platelet Count: 185 10*3/uL (ref 145–400)
RBC: 3.88 MIL/uL (ref 3.70–5.45)
RDW: 14.5 % (ref 11.2–14.5)
WBC Count: 14.6 10*3/uL — ABNORMAL HIGH (ref 3.9–10.3)

## 2017-10-23 LAB — CMP (CANCER CENTER ONLY)
ALT: 15 U/L (ref 0–44)
AST: 17 U/L (ref 15–41)
Albumin: 4.1 g/dL (ref 3.5–5.0)
Alkaline Phosphatase: 82 U/L (ref 38–126)
Anion gap: 7 (ref 5–15)
BUN: 10 mg/dL (ref 6–20)
CO2: 28 mmol/L (ref 22–32)
Calcium: 9.4 mg/dL (ref 8.9–10.3)
Chloride: 103 mmol/L (ref 98–111)
Creatinine: 0.71 mg/dL (ref 0.44–1.00)
GFR, Est AFR Am: >60 mL/min (ref >60)
GFR, Estimated: >60 mL/min (ref >60)
Glucose, Bld: 117 mg/dL — ABNORMAL HIGH (ref 70–99)
Potassium: 4.4 mmol/L (ref 3.5–5.1)
Sodium: 138 mmol/L (ref 135–145)
Total Bilirubin: <0.2 mg/dL — ABNORMAL LOW (ref 0.3–1.2)
Total Protein: 6.7 g/dL (ref 6.5–8.1)

## 2017-10-23 MED ORDER — PEGFILGRASTIM 6 MG/0.6ML ~~LOC~~ PSKT
PREFILLED_SYRINGE | SUBCUTANEOUS | Status: AC
  Filled 2017-10-23: qty 0.6

## 2017-10-23 MED ORDER — SODIUM CHLORIDE 0.9% FLUSH
10.0000 mL | INTRAVENOUS | Status: DC | PRN
  Administered 2017-10-23: 10 mL
  Filled 2017-10-23: qty 10

## 2017-10-23 MED ORDER — FOSAPREPITANT DIMEGLUMINE INJECTION 150 MG
Freq: Once | INTRAVENOUS | Status: AC
  Administered 2017-10-23: 13:00:00 via INTRAVENOUS
  Filled 2017-10-23: qty 5

## 2017-10-23 MED ORDER — PEGFILGRASTIM 6 MG/0.6ML ~~LOC~~ PSKT
6.0000 mg | PREFILLED_SYRINGE | Freq: Once | SUBCUTANEOUS | Status: AC
  Administered 2017-10-23: 6 mg via SUBCUTANEOUS

## 2017-10-23 MED ORDER — SODIUM CHLORIDE 0.9 % IV SOLN
Freq: Once | INTRAVENOUS | Status: AC
  Administered 2017-10-23: 13:00:00 via INTRAVENOUS

## 2017-10-23 MED ORDER — PALONOSETRON HCL INJECTION 0.25 MG/5ML
INTRAVENOUS | Status: AC
  Filled 2017-10-23: qty 5

## 2017-10-23 MED ORDER — HEPARIN SOD (PORK) LOCK FLUSH 100 UNIT/ML IV SOLN
500.0000 [IU] | Freq: Once | INTRAVENOUS | Status: AC | PRN
  Administered 2017-10-23: 500 [IU]
  Filled 2017-10-23: qty 5

## 2017-10-23 MED ORDER — DOXORUBICIN HCL CHEMO IV INJECTION 2 MG/ML
50.0000 mg/m2 | Freq: Once | INTRAVENOUS | Status: AC
  Administered 2017-10-23: 74 mg via INTRAVENOUS
  Filled 2017-10-23: qty 37

## 2017-10-23 MED ORDER — SODIUM CHLORIDE 0.9 % IV SOLN
500.0000 mg/m2 | Freq: Once | INTRAVENOUS | Status: AC
  Administered 2017-10-23: 740 mg via INTRAVENOUS
  Filled 2017-10-23: qty 37

## 2017-10-23 MED ORDER — PALONOSETRON HCL INJECTION 0.25 MG/5ML
0.2500 mg | Freq: Once | INTRAVENOUS | Status: AC
  Administered 2017-10-23: 0.25 mg via INTRAVENOUS

## 2017-10-23 MED ORDER — OXYCODONE-ACETAMINOPHEN 5-325 MG PO TABS: 1.0000 | ORAL_TABLET | ORAL | 0 refills | Status: DC | PRN

## 2017-10-23 NOTE — Patient Instructions (Signed)
[facility] Discharge Instructions for Patients Receiving Chemotherapy  Today you received the following chemotherapy agents adriamycin/cytoxan  To help prevent nausea and vomiting after your treatment, we encourage you to take your nausea medication as directed  If you develop nausea and vomiting that is not controlled by your nausea medication, call the clinic.   BELOW ARE SYMPTOMS THAT SHOULD BE REPORTED IMMEDIATELY:  *FEVER GREATER THAN 100.5 F  *CHILLS WITH OR WITHOUT FEVER  NAUSEA AND VOMITING THAT IS NOT CONTROLLED WITH YOUR NAUSEA MEDICATION  *UNUSUAL SHORTNESS OF BREATH  *UNUSUAL BRUISING OR BLEEDING  TENDERNESS IN MOUTH AND THROAT WITH OR WITHOUT PRESENCE OF ULCERS  *URINARY PROBLEMS  *BOWEL PROBLEMS  UNUSUAL RASH Items with * indicate a potential emergency and should be followed up as soon as possible.  Feel free to call the clinic you have any questions or concerns. The clinic phone number is [phone].  

## 2017-10-23 NOTE — Progress Notes (Signed)
Patient Care Team: [name] as PCP - General (Family Medicine)  DIAGNOSIS:  Encounter Diagnosis  Name Primary?  . Malignant neoplasm of lower-inner quadrant of right breast of female, estrogen receptor positive ([place])     SUMMARY OF ONCOLOGIC HISTORY:   Malignant neoplasm of lower-inner quadrant of right breast of female, estrogen receptor positive ([place])   08/16/2017 Initial Diagnosis    Right lumpectomy: Grade 2 IDC 1.5 cm, with DCIS and necrosis, 0/3 lymph nodes negative, ER 95%, PR 30%, HER-2 negative ratio 1.14, Ki-67 40%, T1CN0 stage Ia; resection of the margin 09/11/2017: Benign      08/16/2017 Oncotype testing    Oncotype DX recurrence score 31: 19% risk of recurrence at 9 years.  High risk      09/25/2017 -  Chemotherapy    Adjuvant chemotherapy with dose dense Adriamycin and Cytoxan x4 followed by Taxol weekly x12        CHIEF COMPLIANT: Cycle 3 dose dense Adriamycin and Cytoxan  INTERVAL HISTORY: Jasmine Allen is a 52 year old with above-mentioned history of right breast cancer currently on adjuvant chemotherapy and today's cycle 3 of dose dense edematous and Cytoxan.  Her biggest complaint has been the bone pain related to Neulasta.  It seems to last in spite of taking Ultram.  She has been taking several Motrin and is taking 2 Claritin daily. in spite of that she has been having lots of bone pain.  REVIEW OF SYSTEMS:   Constitutional: Denies fevers, chills or abnormal weight loss Eyes: Denies blurriness of vision Ears, nose, mouth, throat, and face: Denies mucositis or sore throat Respiratory: Denies cough, dyspnea or wheezes Cardiovascular: Denies palpitation, chest discomfort Gastrointestinal:  Denies nausea, heartburn or change in bowel habits Skin: Denies abnormal skin rashes Lymphatics: Denies new lymphadenopathy or easy bruising Neurological:Denies numbness, tingling or new weaknesses Behavioral/Psych: Mood is stable, no new changes    Extremities: No lower extremity edema All other systems were reviewed with the patient and are negative.  I have reviewed the past medical history, past surgical history, social history and family history with the patient and they are unchanged from previous note.  ALLERGIES:  is allergic to amoxicillin-pot clavulanate; erythromycin; metoclopramide hcl; and sulfonamide derivatives.  MEDICATIONS:  Current Outpatient Medications  Medication Sig Dispense Refill  . ACETAMINOPHEN-BUTALBITAL 50-325 MG TABS Take 1 tablet by mouth daily as needed (for headache).   0  . ALPRAZolam (XANAX) 0.5 MG tablet Take 0.5 mg by mouth at bedtime as needed for anxiety or sleep.     . Biotin 10000 MCG TABS Take 10,000 mcg by mouth daily.     . cetirizine (ZYRTEC) 10 MG tablet Take 10 mg by mouth every morning.     . cloNIDine (CATAPRES) 0.3 MG tablet Take 0.3 mg by mouth at bedtime.     . cyclobenzaprine (FLEXERIL) 10 MG tablet Take 10 mg by mouth every 8 (eight) hours as needed for muscle spasms.  1  . furosemide (LASIX) 20 MG tablet Take 20 mg by mouth daily as needed for fluid.  5  . hydrocortisone valerate cream (WESTCORT) 0.2 % Apply 1 application topically daily as needed (eczema).     . hydrOXYzine (ATARAX/VISTARIL) 10 MG tablet Take 10-30 mg by mouth at bedtime as needed for itching.   0  . hyoscyamine (LEVSIN SL) 0.125 MG SL tablet dissolve 1 tablet under the tongue every 4 hours if needed (Patient taking differently: dissolve 1 tablet under the tongue every 4 hours  if needed for stomach) 30 tablet 0  . ibuprofen (ADVIL,MOTRIN) 200 MG tablet Take 400-800 mg by mouth every 6 (six) hours as needed for fever, headache or moderate pain.    Marland Kitchen lidocaine-prilocaine (EMLA) cream Apply to affected area once 30 g 3  . LORazepam (ATIVAN) 0.5 MG tablet TAKE 1 TABLET(0.5 MG) BY MOUTH AT BEDTIME AS NEEDED FOR NAUSEA OR VOMITING 30 tablet 0  . Melatonin 5 MG CAPS Take 5 mg by mouth at bedtime as needed (for sleep).      . Multiple Vitamin (MULITIVITAMIN WITH MINERALS) TABS Take 1 tablet by mouth daily.    . ondansetron (ZOFRAN) 8 MG tablet Take 1 tablet (8 mg total) by mouth 2 (two) times daily as needed. Start on the third day after chemotherapy. 30 tablet 1  . oxyCODONE-acetaminophen (PERCOCET/ROXICET) 5-325 MG tablet Take 1 tablet by mouth every 4 (four) hours as needed for severe pain. 10 tablet 0  . pantoprazole (PROTONIX) 40 MG tablet Take 1 tablet (40 mg total) by mouth 2 (two) times daily before a meal. (Patient taking differently: Take 40 mg by mouth daily. ) 60 tablet 6  . prochlorperazine (COMPAZINE) 10 MG tablet Take 1 tablet (10 mg total) by mouth every 6 (six) hours as needed (Nausea or vomiting). 30 tablet 1  . spironolactone (ALDACTONE) 25 MG tablet Take 25 mg by mouth daily.    . traMADol (ULTRAM) 50 MG tablet Take 1 tablet (50 mg total) by mouth every 6 (six) hours as needed. 20 tablet 0  . traZODone (DESYREL) 100 MG tablet Take 100 mg by mouth at bedtime.     . valACYclovir (VALTREX) 1000 MG tablet Take 1,000 mg by mouth 2 (two) times daily as needed (for out breaks).      No current facility-administered medications for this visit.     PHYSICAL EXAMINATION: ECOG PERFORMANCE STATUS: 1 - Symptomatic but completely ambulatory  Vitals:   10/23/17 1036  BP: 116/80  Pulse: 84  Resp: 18  Temp: 97.7 F (36.5 C)  SpO2: 100%   Filed Weights   10/23/17 1036  Weight: 114 lb 14.4 oz (52.1 kg)    GENERAL:alert, no distress and comfortable SKIN: skin color, texture, turgor are normal, no rashes or significant lesions EYES: normal, Conjunctiva are pink and non-injected, sclera clear OROPHARYNX:no exudate, no erythema and lips, buccal mucosa, and tongue normal  NECK: supple, thyroid normal size, non-tender, without nodularity LYMPH:  no palpable lymphadenopathy in the cervical, axillary or inguinal LUNGS: clear to auscultation and percussion with normal breathing effort HEART: regular rate  & rhythm and no murmurs and no lower extremity edema ABDOMEN:abdomen soft, non-tender and normal bowel sounds MUSCULOSKELETAL:no cyanosis of digits and no clubbing  NEURO: alert & oriented x 3 with fluent speech, no focal motor/sensory deficits EXTREMITIES: No lower extremity edema  LABORATORY DATA:  I have reviewed the data as listed CMP Latest Ref Rng & Units 10/23/2017 10/09/2017 10/02/2017  Glucose 70 - 99 mg/dL 117(H) 91 103(H)  BUN 6 - 20 mg/dL _0 Creatinine 0.44 - 1.00 mg/dL 0.71 0.72 0.69  Sodium 135 - 145 mmol/L 138 139 138  Potassium 3.5 - 5.1 mmol/L 4.4 4.0 4.0  Chloride 98 - 111 mmol/L 103 105 102  CO2 22 - 32 mmol/L _1 Calcium 8.9 - 10.3 mg/dL 9.4 9.5 10.0  Total Protein 6.5 - 8.1 g/dL 6.7 6.6 7.2  Total Bilirubin 0.3 - 1.2 mg/dL <0.2(L) <0.2(L) 0.6  Alkaline Phos  38 - 126 U/L 82 89 68  AST 15 - 41 U/L _0 ALT 0 - 44 U/L _1 Lab Results  Component Value Date   WBC 14.6 (H) 10/23/2017   HGB 11.8 10/23/2017   HCT 34.7 (L) 10/23/2017   MCV 89.5 10/23/2017   PLT 185 10/23/2017   NEUTROABS 12.1 (H) 10/23/2017    ASSESSMENT & PLAN:  Malignant neoplasm of lower-inner quadrant of right breast of female, estrogen receptor positive ([place]) 08/16/2017: Right lumpectomy: Grade 2 IDC 1.5 cm, with DCIS and necrosis, 0/3 lymph nodes negative, ER 95%, PR 30%, HER-2 negative ratio 1.14, Ki-67 40%, T1CN0 stage Ia Resection of the margins 09/11/2017: Benign  Treatment plan: 1.Systemic chemotherapy with dose dense Adriamycin and Cytoxan x4 followed by Taxol weekly x12 2. adjuvant radiation therapy 3 followed by adjuvant antiestrogen therapy. -------------------------------------------------------------------------------- Current treatment: Cycle 3 day 1 dose dense Adriamycin and Cytoxan  Chemo toxicities: 1.  Low neutrophil count after cycle 1: We reduce the dosage of cycle 2 2. bone pain due to Neulasta: I discontinue tramadol and gave her a  prescription for her cassette 10 tablets to be taken with this in the next chemotherapy. Otherwise no side effects from chemotherapy.  Monitoring closely for chemo toxicities Return to clinic in 2 weeks for cycle 4   No orders of the defined types were placed in this encounter.  The patient has a good understanding of the overall plan. she agrees with it. she will call with any problems that may develop before the next visit here.   Harriette Ohara, MD 10/23/17

## 2017-10-23 NOTE — Assessment & Plan Note (Signed)
08/16/2017: Right lumpectomy: Grade 2 IDC 1.5 cm, with DCIS and necrosis, 0/3 lymph nodes negative, ER 95%, PR 30%, HER-2 negative ratio 1.14, Ki-67 40%, T1CN0 stage Ia Resection of the margins 09/11/2017: Benign  Treatment plan: 1.Systemic chemotherapy with dose dense Adriamycin and Cytoxan x4 followed by Taxol weekly x12 2. adjuvant radiation therapy 3 followed by adjuvant antiestrogen therapy. -------------------------------------------------------------------------------- Current treatment: Cycle 3 day 1 dose dense Adriamycin and Cytoxan  Chemo toxicities: 1.  Low neutrophil count after cycle 1: We reduce the dosage of cycle 2 2. bone pain due to Neulasta I sent a prescription for tramadol. Otherwise no side effects from chemotherapy.  Monitoring closely for chemo toxicities Return to clinic in 2 weeks for cycle 4    

## 2017-10-25 ENCOUNTER — Ambulatory Visit: Payer: 59

## 2017-10-26 ENCOUNTER — Telehealth: Payer: Self-pay | Admitting: Hematology and Oncology

## 2017-10-26 NOTE — Telephone Encounter (Signed)
Spoke to patient regarding upcoming appts per 7/16 sch message

## 2017-11-06 ENCOUNTER — Inpatient Hospital Stay: Payer: 59

## 2017-11-06 ENCOUNTER — Encounter: Payer: Self-pay | Admitting: *Deleted

## 2017-11-06 ENCOUNTER — Inpatient Hospital Stay (HOSPITAL_BASED_OUTPATIENT_CLINIC_OR_DEPARTMENT_OTHER): Payer: 59 | Admitting: Hematology and Oncology

## 2017-11-06 ENCOUNTER — Telehealth: Payer: Self-pay

## 2017-11-06 DIAGNOSIS — C50311 Malignant neoplasm of lower-inner quadrant of right female breast: Secondary | ICD-10-CM

## 2017-11-06 DIAGNOSIS — Z95828 Presence of other vascular implants and grafts: Secondary | ICD-10-CM

## 2017-11-06 DIAGNOSIS — Z170 Estrogen receptor positive status [ER+]: Principal | ICD-10-CM

## 2017-11-06 DIAGNOSIS — Z5111 Encounter for antineoplastic chemotherapy: Secondary | ICD-10-CM | POA: Diagnosis not present

## 2017-11-06 DIAGNOSIS — M898X9 Other specified disorders of bone, unspecified site: Secondary | ICD-10-CM | POA: Diagnosis not present

## 2017-11-06 LAB — CMP (CANCER CENTER ONLY)
ALT: 14 U/L (ref 0–44)
AST: 19 U/L (ref 15–41)
Albumin: 4.1 g/dL (ref 3.5–5.0)
Alkaline Phosphatase: 97 U/L (ref 38–126)
Anion gap: 8 (ref 5–15)
BUN: 14 mg/dL (ref 6–20)
CO2: 27 mmol/L (ref 22–32)
Calcium: 9.4 mg/dL (ref 8.9–10.3)
Chloride: 104 mmol/L (ref 98–111)
Creatinine: 0.7 mg/dL (ref 0.44–1.00)
GFR, Est AFR Am: >60 mL/min (ref >60)
GFR, Estimated: >60 mL/min (ref >60)
Glucose, Bld: 109 mg/dL — ABNORMAL HIGH (ref 70–99)
Potassium: 4.6 mmol/L (ref 3.5–5.1)
Sodium: 139 mmol/L (ref 135–145)
Total Bilirubin: 0.2 mg/dL — ABNORMAL LOW (ref 0.3–1.2)
Total Protein: 6.8 g/dL (ref 6.5–8.1)

## 2017-11-06 LAB — CBC WITH DIFFERENTIAL (CANCER CENTER ONLY)
Basophils Absolute: 0.1 10*3/uL (ref 0.0–0.1)
Basophils Relative: 0 %
Eosinophils Absolute: 0 10*3/uL (ref 0.0–0.5)
Eosinophils Relative: 0 %
HCT: 34 % — ABNORMAL LOW (ref 34.8–46.6)
Hemoglobin: 11.5 g/dL — ABNORMAL LOW (ref 11.6–15.9)
Lymphocytes Relative: 8 %
Lymphs Abs: 1.2 10*3/uL (ref 0.9–3.3)
MCH: 31 pg (ref 25.1–34.0)
MCHC: 33.8 g/dL (ref 31.5–36.0)
MCV: 91.6 fL (ref 79.5–101.0)
Monocytes Absolute: 1.5 10*3/uL — ABNORMAL HIGH (ref 0.1–0.9)
Monocytes Relative: 9 %
Neutro Abs: 13.1 10*3/uL — ABNORMAL HIGH (ref 1.5–6.5)
Neutrophils Relative %: 83 %
Platelet Count: 155 10*3/uL (ref 145–400)
RBC: 3.71 MIL/uL (ref 3.70–5.45)
RDW: 17 % — ABNORMAL HIGH (ref 11.2–14.5)
WBC Count: 15.9 10*3/uL — ABNORMAL HIGH (ref 3.9–10.3)

## 2017-11-06 MED ORDER — PALONOSETRON HCL INJECTION 0.25 MG/5ML
0.2500 mg | Freq: Once | INTRAVENOUS | Status: AC
  Administered 2017-11-06: 0.25 mg via INTRAVENOUS

## 2017-11-06 MED ORDER — PEGFILGRASTIM 6 MG/0.6ML ~~LOC~~ PSKT
PREFILLED_SYRINGE | SUBCUTANEOUS | Status: AC
  Filled 2017-11-06: qty 0.6

## 2017-11-06 MED ORDER — DOXORUBICIN HCL CHEMO IV INJECTION 2 MG/ML
50.0000 mg/m2 | Freq: Once | INTRAVENOUS | Status: AC
  Administered 2017-11-06: 74 mg via INTRAVENOUS
  Filled 2017-11-06: qty 37

## 2017-11-06 MED ORDER — PEGFILGRASTIM 6 MG/0.6ML ~~LOC~~ PSKT
6.0000 mg | PREFILLED_SYRINGE | Freq: Once | SUBCUTANEOUS | Status: AC
  Administered 2017-11-06: 6 mg via SUBCUTANEOUS

## 2017-11-06 MED ORDER — SODIUM CHLORIDE 0.9% FLUSH
10.0000 mL | INTRAVENOUS | Status: DC | PRN
  Administered 2017-11-06: 10 mL
  Filled 2017-11-06: qty 10

## 2017-11-06 MED ORDER — SODIUM CHLORIDE 0.9 % IV SOLN
500.0000 mg/m2 | Freq: Once | INTRAVENOUS | Status: AC
  Administered 2017-11-06: 740 mg via INTRAVENOUS
  Filled 2017-11-06: qty 37

## 2017-11-06 MED ORDER — SODIUM CHLORIDE 0.9 % IV SOLN
Freq: Once | INTRAVENOUS | Status: AC
  Administered 2017-11-06: 12:00:00 via INTRAVENOUS
  Filled 2017-11-06: qty 250

## 2017-11-06 MED ORDER — HEPARIN SOD (PORK) LOCK FLUSH 100 UNIT/ML IV SOLN
500.0000 [IU] | Freq: Once | INTRAVENOUS | Status: AC | PRN
  Administered 2017-11-06: 500 [IU]
  Filled 2017-11-06: qty 5

## 2017-11-06 MED ORDER — OXYCODONE-ACETAMINOPHEN 5-325 MG PO TABS: 2.0000 | ORAL_TABLET | Freq: Three times a day (TID) | ORAL | 0 refills | Status: DC | PRN

## 2017-11-06 MED ORDER — SODIUM CHLORIDE 0.9 % IV SOLN
Freq: Once | INTRAVENOUS | Status: AC
  Administered 2017-11-06: 13:00:00 via INTRAVENOUS
  Filled 2017-11-06: qty 5

## 2017-11-06 MED ORDER — PALONOSETRON HCL INJECTION 0.25 MG/5ML
INTRAVENOUS | Status: AC
  Filled 2017-11-06: qty 5

## 2017-11-06 NOTE — Progress Notes (Signed)
Patient Care Team: [name] as PCP - General (Family Medicine)  DIAGNOSIS:  Encounter Diagnosis  Name Primary?  . Malignant neoplasm of lower-inner quadrant of right breast of female, estrogen receptor positive ([place])     SUMMARY OF ONCOLOGIC HISTORY:   Malignant neoplasm of lower-inner quadrant of right breast of female, estrogen receptor positive ([place])   08/16/2017 Initial Diagnosis    Right lumpectomy: Grade 2 IDC 1.5 cm, with DCIS and necrosis, 0/3 lymph nodes negative, ER 95%, PR 30%, HER-2 negative ratio 1.14, Ki-67 40%, T1CN0 stage Ia; resection of the margin 09/11/2017: Benign      08/16/2017 Oncotype testing    Oncotype DX recurrence score 31: 19% risk of recurrence at 9 years.  High risk      09/25/2017 -  Chemotherapy    Adjuvant chemotherapy with dose dense Adriamycin and Cytoxan x4 followed by Taxol weekly x12        CHIEF COMPLIANT: Cycle 4 dose dense Adriamycin and Cytoxan  INTERVAL HISTORY: Jasmine Allen is a 52 year old with above-mentioned history of right breast cancer currently on adjuvant chemotherapy with dose dense Adriamycin and Cytoxan.  Today cycle 4 of treatment.  She tolerated cycle 3 extremely well.  She does not have any nausea vomiting.  She did have bone pain that lasted even after this day.  She has got excellent energy.  She is planning to go to the beach next week.  REVIEW OF SYSTEMS:   Constitutional: Denies fevers, chills or abnormal weight loss Eyes: Denies blurriness of vision Ears, nose, mouth, throat, and face: Denies mucositis or sore throat Respiratory: Denies cough, dyspnea or wheezes Cardiovascular: Denies palpitation, chest discomfort Gastrointestinal:  Denies nausea, heartburn or change in bowel habits Skin: Denies abnormal skin rashes Lymphatics: Denies new lymphadenopathy or easy bruising Neurological:Denies numbness, tingling or new weaknesses Behavioral/Psych: Mood is stable, no new changes  Extremities: No  lower extremity edema Breast:  denies any pain or lumps or nodules in either breasts All other systems were reviewed with the patient and are negative.  I have reviewed the past medical history, past surgical history, social history and family history with the patient and they are unchanged from previous note.  ALLERGIES:  is allergic to amoxicillin-pot clavulanate; erythromycin; metoclopramide hcl; and sulfonamide derivatives.  MEDICATIONS:  Current Outpatient Medications  Medication Sig Dispense Refill  . ACETAMINOPHEN-BUTALBITAL 50-325 MG TABS Take 1 tablet by mouth daily as needed (for headache).   0  . ALPRAZolam (XANAX) 0.5 MG tablet Take 0.5 mg by mouth at bedtime as needed for anxiety or sleep.     . Biotin 10000 MCG TABS Take 10,000 mcg by mouth daily.     . cetirizine (ZYRTEC) 10 MG tablet Take 10 mg by mouth every morning.     . cloNIDine (CATAPRES) 0.3 MG tablet Take 0.3 mg by mouth at bedtime.     . cyclobenzaprine (FLEXERIL) 10 MG tablet Take 10 mg by mouth every 8 (eight) hours as needed for muscle spasms.  1  . furosemide (LASIX) 20 MG tablet Take 20 mg by mouth daily as needed for fluid.  5  . hydrocortisone valerate cream (WESTCORT) 0.2 % Apply 1 application topically daily as needed (eczema).     . hydrOXYzine (ATARAX/VISTARIL) 10 MG tablet Take 10-30 mg by mouth at bedtime as needed for itching.   0  . hyoscyamine (LEVSIN SL) 0.125 MG SL tablet dissolve 1 tablet under the tongue every 4 hours if needed (Patient taking  differently: dissolve 1 tablet under the tongue every 4 hours if needed for stomach) 30 tablet 0  . ibuprofen (ADVIL,MOTRIN) 200 MG tablet Take 400-800 mg by mouth every 6 (six) hours as needed for fever, headache or moderate pain.    Marland Kitchen lidocaine-prilocaine (EMLA) cream Apply to affected area once 30 g 3  . LORazepam (ATIVAN) 0.5 MG tablet TAKE 1 TABLET(0.5 MG) BY MOUTH AT BEDTIME AS NEEDED FOR NAUSEA OR VOMITING 30 tablet 0  . Melatonin 5 MG CAPS Take 5 mg  by mouth at bedtime as needed (for sleep).     . Multiple Vitamin (MULITIVITAMIN WITH MINERALS) TABS Take 1 tablet by mouth daily.    . ondansetron (ZOFRAN) 8 MG tablet Take 1 tablet (8 mg total) by mouth 2 (two) times daily as needed. Start on the third day after chemotherapy. 30 tablet 1  . oxyCODONE-acetaminophen (PERCOCET/ROXICET) 5-325 MG tablet Take 1 tablet by mouth every 4 (four) hours as needed for severe pain. 10 tablet 0  . pantoprazole (PROTONIX) 40 MG tablet Take 1 tablet (40 mg total) by mouth 2 (two) times daily before a meal. (Patient taking differently: Take 40 mg by mouth daily. ) 60 tablet 6  . prochlorperazine (COMPAZINE) 10 MG tablet Take 1 tablet (10 mg total) by mouth every 6 (six) hours as needed (Nausea or vomiting). 30 tablet 1  . spironolactone (ALDACTONE) 25 MG tablet Take 25 mg by mouth daily.    . traMADol (ULTRAM) 50 MG tablet Take 1 tablet (50 mg total) by mouth every 6 (six) hours as needed. 20 tablet 0  . traZODone (DESYREL) 100 MG tablet Take 100 mg by mouth at bedtime.     . valACYclovir (VALTREX) 1000 MG tablet Take 1,000 mg by mouth 2 (two) times daily as needed (for out breaks).      No current facility-administered medications for this visit.     PHYSICAL EXAMINATION: ECOG PERFORMANCE STATUS: 1 - Symptomatic but completely ambulatory  Vitals:   11/06/17 1141  BP: 129/80  Pulse: 79  Resp: 17  Temp: 97.9 F (36.6 C)  SpO2: 100%   Filed Weights   11/06/17 1141  Weight: 115 lb 11.2 oz (52.5 kg)    GENERAL:alert, no distress and comfortable SKIN: skin color, texture, turgor are normal, no rashes or significant lesions EYES: normal, Conjunctiva are pink and non-injected, sclera clear OROPHARYNX:no exudate, no erythema and lips, buccal mucosa, and tongue normal  NECK: supple, thyroid normal size, non-tender, without nodularity LYMPH:  no palpable lymphadenopathy in the cervical, axillary or inguinal LUNGS: clear to auscultation and percussion with  normal breathing effort HEART: regular rate & rhythm and no murmurs and no lower extremity edema ABDOMEN:abdomen soft, non-tender and normal bowel sounds MUSCULOSKELETAL:no cyanosis of digits and no clubbing  NEURO: alert & oriented x 3 with fluent speech, no focal motor/sensory deficits EXTREMITIES: No lower extremity edema   LABORATORY DATA:  I have reviewed the data as listed CMP Latest Ref Rng & Units 10/23/2017 10/09/2017 10/02/2017  Glucose 70 - 99 mg/dL 117(H) 91 103(H)  BUN 6 - 20 mg/dL _0 Creatinine 0.44 - 1.00 mg/dL 0.71 0.72 0.69  Sodium 135 - 145 mmol/L 138 139 138  Potassium 3.5 - 5.1 mmol/L 4.4 4.0 4.0  Chloride 98 - 111 mmol/L 103 105 102  CO2 22 - 32 mmol/L _1 Calcium 8.9 - 10.3 mg/dL 9.4 9.5 10.0  Total Protein 6.5 - 8.1 g/dL 6.7 6.6 7.2  Total  Bilirubin 0.3 - 1.2 mg/dL <0.2(L) <0.2(L) 0.6  Alkaline Phos 38 - 126 U/L 82 89 68  AST 15 - 41 U/L _0 ALT 0 - 44 U/L _1 Lab Results  Component Value Date   WBC 15.9 (H) 11/06/2017   HGB 11.5 (L) 11/06/2017   HCT 34.0 (L) 11/06/2017   MCV 91.6 11/06/2017   PLT 155 11/06/2017   NEUTROABS 13.1 (H) 11/06/2017    ASSESSMENT & PLAN:  Malignant neoplasm of lower-inner quadrant of right breast of female, estrogen receptor positive ([place]) 08/16/2017: Right lumpectomy: Grade 2 IDC 1.5 cm, with DCIS and necrosis, 0/3 lymph nodes negative, ER 95%, PR 30%, HER-2 negative ratio 1.14, Ki-67 40%, T1CN0 stage Ia Resection of the margins 09/11/2017: Benign  Treatment plan: 1.Systemic chemotherapy with dose dense Adriamycin and Cytoxan x4 followed by Taxol weekly x12 2. adjuvant radiation therapy 3 followed by adjuvant antiestrogen therapy. -------------------------------------------------------------------------------- Current treatment: Cycle4day 1dose dense Adriamycin and Cytoxan  Chemo toxicities: 1.Low neutrophil count after cycle 1: We reduced the dosage of cycle 2 2.bone pain due to  Neulasta She is planning to go to the beach next week.  Monitoring closely for chemo toxicities Return to clinic in2weeksfor cycle 1 Taxol.   No orders of the defined types were placed in this encounter.  The patient has a good understanding of the overall plan. she agrees with it. she will call with any problems that may develop before the next visit here.   Harriette Ohara, MD 11/06/17

## 2017-11-06 NOTE — Assessment & Plan Note (Signed)
08/16/2017: Right lumpectomy: Grade 2 IDC 1.5 cm, with DCIS and necrosis, 0/3 lymph nodes negative, ER 95%, PR 30%, HER-2 negative ratio 1.14, Ki-67 40%, T1CN0 stage Ia Resection of the margins 09/11/2017: Benign  Treatment plan: 1.Systemic chemotherapy with dose dense Adriamycin and Cytoxan x4 followed by Taxol weekly x12 2. adjuvant radiation therapy 3 followed by adjuvant antiestrogen therapy. -------------------------------------------------------------------------------- Current treatment: Cycle4day 1dose dense Adriamycin and Cytoxan  Chemo toxicities: 1.Low neutrophil count after cycle 1: We reduced the dosage of cycle 2 2.bone pain due to Neulasta  Monitoring closely for chemo toxicities Return to clinic in2weeksfor cycle 1 Taxol.

## 2017-11-06 NOTE — Patient Instructions (Signed)
[facility] Discharge Instructions for Patients Receiving Chemotherapy  Today you received the following chemotherapy agents: Adriamycin, Cytoxan  To help prevent nausea and vomiting after your treatment, we encourage you to take your nausea medication as directed.   If you develop nausea and vomiting that is not controlled by your nausea medication, call the clinic.   BELOW ARE SYMPTOMS THAT SHOULD BE REPORTED IMMEDIATELY:  *FEVER GREATER THAN 100.5 F  *CHILLS WITH OR WITHOUT FEVER  NAUSEA AND VOMITING THAT IS NOT CONTROLLED WITH YOUR NAUSEA MEDICATION  *UNUSUAL SHORTNESS OF BREATH  *UNUSUAL BRUISING OR BLEEDING  TENDERNESS IN MOUTH AND THROAT WITH OR WITHOUT PRESENCE OF ULCERS  *URINARY PROBLEMS  *BOWEL PROBLEMS  UNUSUAL RASH Items with * indicate a potential emergency and should be followed up as soon as possible.  Feel free to call the clinic should you have any questions or concerns. The clinic phone number is [phone].  Please show the CHEMO ALERT CARD at check-in to the Emergency Department and triage nurse.   

## 2017-11-06 NOTE — Telephone Encounter (Signed)
Seth Bake RN in infusion called regarding pt said she was given prescription for Percocet for pain, is going out of town and pt verbalized concern that 10 tabs will not last her.  Notified Dr Lindi Adie, per him, pt can take Ibuprofen in between and call office if she needs additional pain med.  Notified Probation officer.

## 2017-11-08 ENCOUNTER — Ambulatory Visit: Payer: 59

## 2017-11-16 ENCOUNTER — Telehealth: Payer: Self-pay

## 2017-11-16 MED ORDER — OXYCODONE-ACETAMINOPHEN 5-325 MG PO TABS: 2.0000 | ORAL_TABLET | Freq: Three times a day (TID) | ORAL | 0 refills | Status: DC | PRN

## 2017-11-16 NOTE — Telephone Encounter (Signed)
Returned patient's call.  Patient requesting a refill on pain medication, Percocet.  Dr. Jana Hakim approved RX for 10 tablets, Dr. Lindi Adie will further review need at upcoming appointment on 11/20/2017.  Patient aware that script is ready for pick up.

## 2017-11-19 ENCOUNTER — Ambulatory Visit: Payer: 59

## 2017-11-19 ENCOUNTER — Ambulatory Visit: Payer: 59 | Admitting: Hematology and Oncology

## 2017-11-19 ENCOUNTER — Other Ambulatory Visit: Payer: 59

## 2017-11-20 ENCOUNTER — Inpatient Hospital Stay: Payer: 59

## 2017-11-20 ENCOUNTER — Inpatient Hospital Stay (HOSPITAL_BASED_OUTPATIENT_CLINIC_OR_DEPARTMENT_OTHER): Payer: 59 | Admitting: Hematology and Oncology

## 2017-11-20 ENCOUNTER — Inpatient Hospital Stay: Payer: 59 | Attending: Hematology and Oncology

## 2017-11-20 VITALS — BP 130/74 | HR 87 | Temp 98.8°F | Resp 16

## 2017-11-20 DIAGNOSIS — C50311 Malignant neoplasm of lower-inner quadrant of right female breast: Secondary | ICD-10-CM | POA: Insufficient documentation

## 2017-11-20 DIAGNOSIS — Z95828 Presence of other vascular implants and grafts: Secondary | ICD-10-CM

## 2017-11-20 DIAGNOSIS — Z170 Estrogen receptor positive status [ER+]: Principal | ICD-10-CM

## 2017-11-20 DIAGNOSIS — R530 Neoplastic (malignant) related fatigue: Secondary | ICD-10-CM | POA: Insufficient documentation

## 2017-11-20 DIAGNOSIS — Z5111 Encounter for antineoplastic chemotherapy: Secondary | ICD-10-CM | POA: Insufficient documentation

## 2017-11-20 DIAGNOSIS — R5383 Other fatigue: Secondary | ICD-10-CM | POA: Insufficient documentation

## 2017-11-20 DIAGNOSIS — D6481 Anemia due to antineoplastic chemotherapy: Secondary | ICD-10-CM | POA: Diagnosis not present

## 2017-11-20 LAB — CBC WITH DIFFERENTIAL (CANCER CENTER ONLY)
Basophils Absolute: 0 10*3/uL (ref 0.0–0.1)
Basophils Relative: 0 %
Eosinophils Absolute: 0 10*3/uL (ref 0.0–0.5)
Eosinophils Relative: 0 %
HCT: 31.4 % — ABNORMAL LOW (ref 34.8–46.6)
Hemoglobin: 10.8 g/dL — ABNORMAL LOW (ref 11.6–15.9)
Lymphocytes Relative: 9 %
Lymphs Abs: 0.7 10*3/uL — ABNORMAL LOW (ref 0.9–3.3)
MCH: 31.4 pg (ref 25.1–34.0)
MCHC: 34.4 g/dL (ref 31.5–36.0)
MCV: 91.3 fL (ref 79.5–101.0)
Monocytes Absolute: 0.9 10*3/uL (ref 0.1–0.9)
Monocytes Relative: 11 %
Neutro Abs: 6.4 10*3/uL (ref 1.5–6.5)
Neutrophils Relative %: 80 %
Platelet Count: 157 10*3/uL (ref 145–400)
RBC: 3.44 MIL/uL — ABNORMAL LOW (ref 3.70–5.45)
RDW: 18.3 % — ABNORMAL HIGH (ref 11.2–14.5)
WBC Count: 8 10*3/uL (ref 3.9–10.3)

## 2017-11-20 LAB — CMP (CANCER CENTER ONLY)
ALT: 13 U/L (ref 0–44)
AST: 18 U/L (ref 15–41)
Albumin: 4.2 g/dL (ref 3.5–5.0)
Alkaline Phosphatase: 77 U/L (ref 38–126)
Anion gap: 10 (ref 5–15)
BUN: 11 mg/dL (ref 6–20)
CO2: 23 mmol/L (ref 22–32)
Calcium: 9.1 mg/dL (ref 8.9–10.3)
Chloride: 107 mmol/L (ref 98–111)
Creatinine: 0.62 mg/dL (ref 0.44–1.00)
GFR, Est AFR Am: >60 mL/min (ref >60)
GFR, Estimated: >60 mL/min (ref >60)
Glucose, Bld: 92 mg/dL (ref 70–99)
Potassium: 4.2 mmol/L (ref 3.5–5.1)
Sodium: 140 mmol/L (ref 135–145)
Total Bilirubin: 0.3 mg/dL (ref 0.3–1.2)
Total Protein: 6.8 g/dL (ref 6.5–8.1)

## 2017-11-20 MED ORDER — HEPARIN SOD (PORK) LOCK FLUSH 100 UNIT/ML IV SOLN
500.0000 [IU] | Freq: Once | INTRAVENOUS | Status: AC | PRN
  Administered 2017-11-20: 500 [IU]
  Filled 2017-11-20: qty 5

## 2017-11-20 MED ORDER — SODIUM CHLORIDE 0.9 % IV SOLN
Freq: Once | INTRAVENOUS | Status: AC
  Administered 2017-11-20: 12:00:00 via INTRAVENOUS
  Filled 2017-11-20: qty 250

## 2017-11-20 MED ORDER — SODIUM CHLORIDE 0.9% FLUSH
10.0000 mL | INTRAVENOUS | Status: DC | PRN
  Administered 2017-11-20: 10 mL
  Filled 2017-11-20: qty 10

## 2017-11-20 MED ORDER — FAMOTIDINE IN NACL 20-0.9 MG/50ML-% IV SOLN
INTRAVENOUS | Status: AC
  Filled 2017-11-20: qty 50

## 2017-11-20 MED ORDER — SODIUM CHLORIDE 0.9 % IV SOLN
20.0000 mg | Freq: Once | INTRAVENOUS | Status: AC
  Administered 2017-11-20: 20 mg via INTRAVENOUS
  Filled 2017-11-20: qty 2

## 2017-11-20 MED ORDER — DIPHENHYDRAMINE HCL 50 MG/ML IJ SOLN
INTRAMUSCULAR | Status: AC
  Filled 2017-11-20: qty 1

## 2017-11-20 MED ORDER — DIPHENHYDRAMINE HCL 50 MG/ML IJ SOLN
50.0000 mg | Freq: Once | INTRAMUSCULAR | Status: AC
  Administered 2017-11-20: 50 mg via INTRAVENOUS

## 2017-11-20 MED ORDER — FAMOTIDINE IN NACL 20-0.9 MG/50ML-% IV SOLN
20.0000 mg | Freq: Once | INTRAVENOUS | Status: AC
  Administered 2017-11-20: 20 mg via INTRAVENOUS

## 2017-11-20 MED ORDER — PACLITAXEL CHEMO INJECTION 300 MG/50ML
80.0000 mg/m2 | Freq: Once | INTRAVENOUS | Status: AC
  Administered 2017-11-20: 120 mg via INTRAVENOUS
  Filled 2017-11-20: qty 20

## 2017-11-20 MED ORDER — LORAZEPAM 0.5 MG PO TABS: ORAL_TABLET | ORAL | 1 refills | Status: DC

## 2017-11-20 NOTE — Patient Instructions (Addendum)
[facility] Discharge Instructions for Patients Receiving Chemotherapy  Today you received the following chemotherapy agents Taxol  To help prevent nausea and vomiting after your treatment, we encourage you to take your nausea medication as directed   If you develop nausea and vomiting that is not controlled by your nausea medication, call the clinic.   BELOW ARE SYMPTOMS THAT SHOULD BE REPORTED IMMEDIATELY:  *FEVER GREATER THAN 100.5 F  *CHILLS WITH OR WITHOUT FEVER  NAUSEA AND VOMITING THAT IS NOT CONTROLLED WITH YOUR NAUSEA MEDICATION  *UNUSUAL SHORTNESS OF BREATH  *UNUSUAL BRUISING OR BLEEDING  TENDERNESS IN MOUTH AND THROAT WITH OR WITHOUT PRESENCE OF ULCERS  *URINARY PROBLEMS  *BOWEL PROBLEMS  UNUSUAL RASH Items with * indicate a potential emergency and should be followed up as soon as possible.  Feel free to call the clinic should you have any questions or concerns. The clinic phone number is [phone].  Please show the CHEMO ALERT CARD at check-in to the Emergency Department and triage nurse.   Paclitaxel injection What is this medicine? PACLITAXEL (PAK li TAX el) is a chemotherapy drug. It targets fast dividing cells, like cancer cells, and causes these cells to die. This medicine is used to treat ovarian cancer, breast cancer, and other cancers. This medicine may be used for other purposes; ask your health care provider or pharmacist if you have questions. COMMON BRAND NAME(S): Onxol, Taxol What should I tell my health care provider before I take this medicine? They need to know if you have any of these conditions: -blood disorders -irregular heartbeat -infection (especially a virus infection such as chickenpox, cold sores, or herpes) -liver disease -previous or ongoing radiation therapy -an unusual or allergic reaction to paclitaxel, alcohol, polyoxyethylated castor oil, other chemotherapy agents, other medicines, foods, dyes, or  preservatives -pregnant or trying to get pregnant -breast-feeding How should I use this medicine? This drug is given as an infusion into a vein. It is administered in a hospital or clinic by a specially trained health care professional. Talk to your pediatrician regarding the use of this medicine in children. Special care may be needed. Overdosage: If you think you have taken too much of this medicine contact a poison control center or emergency room at once. NOTE: This medicine is only for you. Do not share this medicine with others. What if I miss a dose? It is important not to miss your dose. Call your doctor or health care professional if you are unable to keep an appointment. What may interact with this medicine? Do not take this medicine with any of the following medications: -disulfiram -metronidazole This medicine may also interact with the following medications: -cyclosporine -diazepam -ketoconazole -medicines to increase blood counts like filgrastim, pegfilgrastim, sargramostim -other chemotherapy drugs like cisplatin, doxorubicin, epirubicin, etoposide, teniposide, vincristine -quinidine -testosterone -vaccines -verapamil Talk to your doctor or health care professional before taking any of these medicines: -acetaminophen -aspirin -ibuprofen -ketoprofen -naproxen This list may not describe all possible interactions. Give your health care provider a list of all the medicines, herbs, non-prescription drugs, or dietary supplements you use. Also tell them if you smoke, drink alcohol, or use illegal drugs. Some items may interact with your medicine. What should I watch for while using this medicine? Your condition will be monitored carefully while you are receiving this medicine. You will need important blood work done while you are taking this medicine. This medicine can cause serious allergic reactions. To reduce your risk you will need to   take other medicine(s) before  treatment with this medicine. If you experience allergic reactions like skin rash, itching or hives, swelling of the face, lips, or tongue, tell your doctor or health care professional right away. In some cases, you may be given additional medicines to help with side effects. Follow all directions for their use. This drug may make you feel generally unwell. This is not uncommon, as chemotherapy can affect healthy cells as well as cancer cells. Report any side effects. Continue your course of treatment even though you feel ill unless your doctor tells you to stop. Call your doctor or health care professional for advice if you get a fever, chills or sore throat, or other symptoms of a cold or flu. Do not treat yourself. This drug decreases your body's ability to fight infections. Try to avoid being around people who are sick. This medicine may increase your risk to bruise or bleed. Call your doctor or health care professional if you notice any unusual bleeding. Be careful brushing and flossing your teeth or using a toothpick because you may get an infection or bleed more easily. If you have any dental work done, tell your dentist you are receiving this medicine. Avoid taking products that contain aspirin, acetaminophen, ibuprofen, naproxen, or ketoprofen unless instructed by your doctor. These medicines may hide a fever. Do not become pregnant while taking this medicine. Women should inform their doctor if they wish to become pregnant or think they might be pregnant. There is a potential for serious side effects to an unborn child. Talk to your health care professional or pharmacist for more information. Do not breast-feed an infant while taking this medicine. Men are advised not to father a child while receiving this medicine. This product may contain alcohol. Ask your pharmacist or healthcare provider if this medicine contains alcohol. Be sure to tell all healthcare providers you are taking this medicine.  Certain medicines, like metronidazole and disulfiram, can cause an unpleasant reaction when taken with alcohol. The reaction includes flushing, headache, nausea, vomiting, sweating, and increased thirst. The reaction can last from 30 minutes to several hours. What side effects may I notice from receiving this medicine? Side effects that you should report to your doctor or health care professional as soon as possible: -allergic reactions like skin rash, itching or hives, swelling of the face, lips, or tongue -low blood counts - This drug may decrease the number of white blood cells, red blood cells and platelets. You may be at increased risk for infections and bleeding. -signs of infection - fever or chills, cough, sore throat, pain or difficulty passing urine -signs of decreased platelets or bleeding - bruising, pinpoint red spots on the skin, black, tarry stools, nosebleeds -signs of decreased red blood cells - unusually weak or tired, fainting spells, lightheadedness -breathing problems -chest pain -high or low blood pressure -mouth sores -nausea and vomiting -pain, swelling, redness or irritation at the injection site -pain, tingling, numbness in the hands or feet -slow or irregular heartbeat -swelling of the ankle, feet, hands Side effects that usually do not require medical attention (report to your doctor or health care professional if they continue or are bothersome): -bone pain -complete hair loss including hair on your head, underarms, pubic hair, eyebrows, and eyelashes -changes in the color of fingernails -diarrhea -loosening of the fingernails -loss of appetite -muscle or joint pain -red flush to skin -sweating This list may not describe all possible side effects. Call your doctor for medical advice about   side effects. You may report side effects to FDA at 1-800-FDA-1088. Where should I keep my medicine? This drug is given in a hospital or clinic and will not be stored at  home. NOTE: This sheet is a summary. It may not cover all possible information. If you have questions about this medicine, talk to your doctor, pharmacist, or health care provider.  2018 [employer]vier/Gold Standard (2015-01-26 19:58:00)   

## 2017-11-20 NOTE — Assessment & Plan Note (Signed)
08/16/2017: Right lumpectomy: Grade 2 IDC 1.5 cm, with DCIS and necrosis, 0/3 lymph nodes negative, ER 95%, PR 30%, HER-2 negative ratio 1.14, Ki-67 40%, T1CN0 stage Ia Resection of the margins 09/11/2017: Benign  Treatment plan: 1.Systemic chemotherapy with dose dense Adriamycin and Cytoxan x4 followed by Taxol weekly x12 2. adjuvant radiation therapy 3 followed by adjuvant antiestrogen therapy. -------------------------------------------------------------------------------- Current treatment: Completed 4 cycles of dose dense Adriamycin and Cytoxan, today is cycle 1 Taxol 1 week for cycle 2 of Taxol and to assess toxicities  Chemo toxicities: 1.Low neutrophil count after cycle 1: We reduced the dosage of cycle 2 2.bone pain due to Neulasta She is planning to go to the beach next week.  Monitoring closely for chemo toxicities Return to clinic in1 week for cycle 2 of Taxol and to assess chemo toxicities.

## 2017-11-20 NOTE — Progress Notes (Signed)
Patient Care Team: [name] as PCP - General (Family Medicine)  DIAGNOSIS:  Encounter Diagnosis  Name Primary?  . Malignant neoplasm of lower-inner quadrant of right breast of female, estrogen receptor positive ([place])     SUMMARY OF ONCOLOGIC HISTORY:   Malignant neoplasm of lower-inner quadrant of right breast of female, estrogen receptor positive ([place])   08/16/2017 Initial Diagnosis    Right lumpectomy: Grade 2 IDC 1.5 cm, with DCIS and necrosis, 0/3 lymph nodes negative, ER 95%, PR 30%, HER-2 negative ratio 1.14, Ki-67 40%, T1CN0 stage Ia; resection of the margin 09/11/2017: Benign    08/16/2017 Oncotype testing    Oncotype DX recurrence score 31: 19% risk of recurrence at 9 years.  High risk    09/25/2017 -  Chemotherapy    Adjuvant chemotherapy with dose dense Adriamycin and Cytoxan x4 followed by Taxol weekly x12      CHIEF COMPLIANT: Cycle 1 Taxol  INTERVAL HISTORY: Jasmine Allen is a 52 year old with above-mentioned history of right breast cancer currently on adjuvant chemotherapy and today is cycle 1 of Taxol.  She is very emotional and sad today because she has not been able to function as she always used to do before chemo.  She went to the beach and it seemed to have a good time.  She feels fatigued.  REVIEW OF SYSTEMS:   Constitutional: Fatigue Eyes: Denies blurriness of vision Ears, nose, mouth, throat, and face: Denies mucositis or sore throat Respiratory: Denies cough, dyspnea or wheezes Cardiovascular: Denies palpitation, chest discomfort Gastrointestinal:  Denies nausea, heartburn or change in bowel habits Skin: Denies abnormal skin rashes Lymphatics: Denies new lymphadenopathy or easy bruising Neurological:Denies numbness, tingling or new weaknesses Behavioral/Psych: Mood is stable, no new changes  Extremities: No lower extremity edema Breast:  denies any pain or lumps or nodules in either breasts All other systems were reviewed with the  patient and are negative.  I have reviewed the past medical history, past surgical history, social history and family history with the patient and they are unchanged from previous note.  ALLERGIES:  is allergic to amoxicillin-pot clavulanate; erythromycin; metoclopramide hcl; and sulfonamide derivatives.  MEDICATIONS:  Current Outpatient Medications  Medication Sig Dispense Refill  . ACETAMINOPHEN-BUTALBITAL 50-325 MG TABS Take 1 tablet by mouth daily as needed (for headache).   0  . ALPRAZolam (XANAX) 0.5 MG tablet Take 0.5 mg by mouth at bedtime as needed for anxiety or sleep.     . Biotin 10000 MCG TABS Take 10,000 mcg by mouth daily.     . cloNIDine (CATAPRES) 0.3 MG tablet Take 0.3 mg by mouth at bedtime.     . cyclobenzaprine (FLEXERIL) 10 MG tablet Take 10 mg by mouth every 8 (eight) hours as needed for muscle spasms.  1  . furosemide (LASIX) 20 MG tablet Take 20 mg by mouth daily as needed for fluid.  5  . hydrocortisone valerate cream (WESTCORT) 0.2 % Apply 1 application topically daily as needed (eczema).     . hydrOXYzine (ATARAX/VISTARIL) 10 MG tablet Take 10-30 mg by mouth at bedtime as needed for itching.   0  . hyoscyamine (LEVSIN SL) 0.125 MG SL tablet dissolve 1 tablet under the tongue every 4 hours if needed (Patient taking differently: dissolve 1 tablet under the tongue every 4 hours if needed for stomach) 30 tablet 0  . ibuprofen (ADVIL,MOTRIN) 200 MG tablet Take 400-800 mg by mouth every 6 (six) hours as needed for fever, headache or moderate  pain.    Marland Kitchen LORazepam (ATIVAN) 0.5 MG tablet TAKE 1 TABLET(0.5 MG) BY MOUTH AT BEDTIME AS NEEDED FOR NAUSEA OR VOMITING 30 tablet 0  . Melatonin 5 MG CAPS Take 5 mg by mouth at bedtime as needed (for sleep).     . Multiple Vitamin (MULITIVITAMIN WITH MINERALS) TABS Take 1 tablet by mouth daily.    . ondansetron (ZOFRAN) 8 MG tablet Take 1 tablet (8 mg total) by mouth 2 (two) times daily as needed. Start on the third day after  chemotherapy. 30 tablet 1  . oxyCODONE-acetaminophen (PERCOCET/ROXICET) 5-325 MG tablet Take 2 tablets by mouth every 8 (eight) hours as needed for severe pain. 10 tablet 0  . pantoprazole (PROTONIX) 40 MG tablet Take 1 tablet (40 mg total) by mouth 2 (two) times daily before a meal. (Patient taking differently: Take 40 mg by mouth daily. ) 60 tablet 6  . prochlorperazine (COMPAZINE) 10 MG tablet Take 1 tablet (10 mg total) by mouth every 6 (six) hours as needed (Nausea or vomiting). 30 tablet 1  . spironolactone (ALDACTONE) 25 MG tablet Take 25 mg by mouth daily.    . traZODone (DESYREL) 100 MG tablet Take 100 mg by mouth at bedtime.     . valACYclovir (VALTREX) 1000 MG tablet Take 1,000 mg by mouth 2 (two) times daily as needed (for out breaks).      No current facility-administered medications for this visit.     PHYSICAL EXAMINATION: ECOG PERFORMANCE STATUS: 1 - Symptomatic but completely ambulatory  Vitals:   11/20/17 1103  BP: 127/82  Pulse: 89  Resp: 17  Temp: 97.9 F (36.6 C)  SpO2: 100%   Filed Weights   11/20/17 1103  Weight: 114 lb 3.2 oz (51.8 kg)    GENERAL:alert, no distress and comfortable SKIN: skin color, texture, turgor are normal, no rashes or significant lesions EYES: normal, Conjunctiva are pink and non-injected, sclera clear OROPHARYNX:no exudate, no erythema and lips, buccal mucosa, and tongue normal  NECK: supple, thyroid normal size, non-tender, without nodularity LYMPH:  no palpable lymphadenopathy in the cervical, axillary or inguinal LUNGS: clear to auscultation and percussion with normal breathing effort HEART: regular rate & rhythm and no murmurs and no lower extremity edema ABDOMEN:abdomen soft, non-tender and normal bowel sounds MUSCULOSKELETAL:no cyanosis of digits and no clubbing  NEURO: alert & oriented x 3 with fluent speech, no focal motor/sensory deficits EXTREMITIES: No lower extremity edema   LABORATORY DATA:  I have reviewed the data  as listed CMP Latest Ref Rng & Units 11/20/2017 11/06/2017 10/23/2017  Glucose 70 - 99 mg/dL 92 109(H) 117(H)  BUN 6 - 20 mg/dL 11 14 10   Creatinine 0.44 - 1.00 mg/dL 0.62 0.70 0.71  Sodium 135 - 145 mmol/L 140 139 138  Potassium 3.5 - 5.1 mmol/L 4.2 4.6 4.4  Chloride 98 - 111 mmol/L 107 104 103  CO2 22 - 32 mmol/L 23 27 28   Calcium 8.9 - 10.3 mg/dL 9.1 9.4 9.4  Total Protein 6.5 - 8.1 g/dL 6.8 6.8 6.7  Total Bilirubin 0.3 - 1.2 mg/dL 0.3 0.2(L) <0.2(L)  Alkaline Phos 38 - 126 U/L 77 97 82  AST 15 - 41 U/L 18 19 17   ALT 0 - 44 U/L 13 14 15     Lab Results  Component Value Date   WBC 8.0 11/20/2017   HGB 10.8 (L) 11/20/2017   HCT 31.4 (L) 11/20/2017   MCV 91.3 11/20/2017   PLT 157 11/20/2017   NEUTROABS 6.4 11/20/2017  ASSESSMENT & PLAN:  Malignant neoplasm of lower-inner quadrant of right breast of female, estrogen receptor positive ([place]) 08/16/2017: Right lumpectomy: Grade 2 IDC 1.5 cm, with DCIS and necrosis, 0/3 lymph nodes negative, ER 95%, PR 30%, HER-2 negative ratio 1.14, Ki-67 40%, T1CN0 stage Ia Resection of the margins 09/11/2017: Benign  Treatment plan: 1.Systemic chemotherapy with dose dense Adriamycin and Cytoxan x4 followed by Taxol weekly x12 2. adjuvant radiation therapy 3 followed by adjuvant antiestrogen therapy. -------------------------------------------------------------------------------- Current treatment: Completed 4 cycles of dose dense Adriamycin and Cytoxan, today is cycle 1 Taxol    Chemo toxicities: 1.Low neutrophil count after cycle 1: We reduced the dosage of cycle 2 2.bone pain due to Neulasta 3.  Patient was very emotional today due to some of the body changes that she had gone through 1 chemo.  She wants to get back on exercises.    Monitoring closely for chemo toxicities Return to clinic weekly for chemo and every other week for follow-up with me    No orders of the defined types were placed in this encounter.  The patient has  a good understanding of the overall plan. she agrees with it. she will call with any problems that may develop before the next visit here.   Harriette Ohara, MD 11/20/17

## 2017-11-27 ENCOUNTER — Inpatient Hospital Stay: Payer: 59

## 2017-11-27 VITALS — BP 117/86 | HR 82 | Temp 98.4°F | Resp 18

## 2017-11-27 DIAGNOSIS — Z170 Estrogen receptor positive status [ER+]: Principal | ICD-10-CM

## 2017-11-27 DIAGNOSIS — Z5111 Encounter for antineoplastic chemotherapy: Secondary | ICD-10-CM | POA: Diagnosis not present

## 2017-11-27 DIAGNOSIS — C50311 Malignant neoplasm of lower-inner quadrant of right female breast: Secondary | ICD-10-CM

## 2017-11-27 DIAGNOSIS — Z95828 Presence of other vascular implants and grafts: Secondary | ICD-10-CM

## 2017-11-27 LAB — CMP (CANCER CENTER ONLY)
ALT: 18 U/L (ref 0–44)
AST: 19 U/L (ref 15–41)
Albumin: 4.1 g/dL (ref 3.5–5.0)
Alkaline Phosphatase: 56 U/L (ref 38–126)
Anion gap: 5 (ref 5–15)
BUN: 10 mg/dL (ref 6–20)
CO2: 30 mmol/L (ref 22–32)
Calcium: 9.4 mg/dL (ref 8.9–10.3)
Chloride: 105 mmol/L (ref 98–111)
Creatinine: 0.63 mg/dL (ref 0.44–1.00)
GFR, Est AFR Am: >60 mL/min (ref >60)
GFR, Estimated: >60 mL/min (ref >60)
Glucose, Bld: 86 mg/dL (ref 70–99)
Potassium: 4.1 mmol/L (ref 3.5–5.1)
Sodium: 140 mmol/L (ref 135–145)
Total Bilirubin: 0.3 mg/dL (ref 0.3–1.2)
Total Protein: 6.6 g/dL (ref 6.5–8.1)

## 2017-11-27 LAB — CBC WITH DIFFERENTIAL (CANCER CENTER ONLY)
Basophils Absolute: 0 10*3/uL (ref 0.0–0.1)
Basophils Relative: 1 %
Eosinophils Absolute: 0 10*3/uL (ref 0.0–0.5)
Eosinophils Relative: 1 %
HCT: 30.4 % — ABNORMAL LOW (ref 34.8–46.6)
Hemoglobin: 10.6 g/dL — ABNORMAL LOW (ref 11.6–15.9)
Lymphocytes Relative: 21 %
Lymphs Abs: 0.8 10*3/uL — ABNORMAL LOW (ref 0.9–3.3)
MCH: 32.6 pg (ref 25.1–34.0)
MCHC: 34.7 g/dL (ref 31.5–36.0)
MCV: 93.8 fL (ref 79.5–101.0)
Monocytes Absolute: 0.5 10*3/uL (ref 0.1–0.9)
Monocytes Relative: 12 %
Neutro Abs: 2.6 10*3/uL (ref 1.5–6.5)
Neutrophils Relative %: 65 %
Platelet Count: 298 10*3/uL (ref 145–400)
RBC: 3.24 MIL/uL — ABNORMAL LOW (ref 3.70–5.45)
RDW: 19.8 % — ABNORMAL HIGH (ref 11.2–14.5)
WBC Count: 4 10*3/uL (ref 3.9–10.3)

## 2017-11-27 MED ORDER — SODIUM CHLORIDE 0.9% FLUSH
10.0000 mL | INTRAVENOUS | Status: DC | PRN
  Administered 2017-11-27: 10 mL
  Filled 2017-11-27: qty 10

## 2017-11-27 MED ORDER — SODIUM CHLORIDE 0.9 % IV SOLN
20.0000 mg | Freq: Once | INTRAVENOUS | Status: AC
  Administered 2017-11-27: 20 mg via INTRAVENOUS
  Filled 2017-11-27: qty 2

## 2017-11-27 MED ORDER — SODIUM CHLORIDE 0.9 % IV SOLN
80.0000 mg/m2 | Freq: Once | INTRAVENOUS | Status: AC
  Administered 2017-11-27: 120 mg via INTRAVENOUS
  Filled 2017-11-27: qty 20

## 2017-11-27 MED ORDER — DIPHENHYDRAMINE HCL 50 MG/ML IJ SOLN
50.0000 mg | Freq: Once | INTRAMUSCULAR | Status: AC
  Administered 2017-11-27: 50 mg via INTRAVENOUS

## 2017-11-27 MED ORDER — FAMOTIDINE IN NACL 20-0.9 MG/50ML-% IV SOLN
INTRAVENOUS | Status: AC
  Filled 2017-11-27: qty 50

## 2017-11-27 MED ORDER — HEPARIN SOD (PORK) LOCK FLUSH 100 UNIT/ML IV SOLN
500.0000 [IU] | Freq: Once | INTRAVENOUS | Status: AC | PRN
  Administered 2017-11-27: 500 [IU]
  Filled 2017-11-27: qty 5

## 2017-11-27 MED ORDER — SODIUM CHLORIDE 0.9 % IV SOLN
Freq: Once | INTRAVENOUS | Status: AC
  Administered 2017-11-27: 12:00:00 via INTRAVENOUS
  Filled 2017-11-27: qty 250

## 2017-11-27 MED ORDER — FAMOTIDINE IN NACL 20-0.9 MG/50ML-% IV SOLN
20.0000 mg | Freq: Once | INTRAVENOUS | Status: AC
  Administered 2017-11-27: 20 mg via INTRAVENOUS

## 2017-11-27 MED ORDER — DIPHENHYDRAMINE HCL 50 MG/ML IJ SOLN
INTRAMUSCULAR | Status: AC
  Filled 2017-11-27: qty 1

## 2017-11-27 NOTE — Progress Notes (Signed)
Okay to treat with today's labs per Dr. Lindi Adie and patient will follow up with Dr. Lindi Adie next week prior to infusion.

## 2017-11-27 NOTE — Patient Instructions (Signed)
[facility] Discharge Instructions for Patients Receiving Chemotherapy  Today you received the following chemotherapy agents:  Taxol.  To help prevent nausea and vomiting after your treatment, we encourage you to take your nausea medication as directed.   If you develop nausea and vomiting that is not controlled by your nausea medication, call the clinic.   BELOW ARE SYMPTOMS THAT SHOULD BE REPORTED IMMEDIATELY:  *FEVER GREATER THAN 100.5 F  *CHILLS WITH OR WITHOUT FEVER  NAUSEA AND VOMITING THAT IS NOT CONTROLLED WITH YOUR NAUSEA MEDICATION  *UNUSUAL SHORTNESS OF BREATH  *UNUSUAL BRUISING OR BLEEDING  TENDERNESS IN MOUTH AND THROAT WITH OR WITHOUT PRESENCE OF ULCERS  *URINARY PROBLEMS  *BOWEL PROBLEMS  UNUSUAL RASH Items with * indicate a potential emergency and should be followed up as soon as possible.  Feel free to call the clinic should you have any questions or concerns. The clinic phone number is [phone].  Please show the CHEMO ALERT CARD at check-in to the Emergency Department and triage nurse.   

## 2017-12-04 ENCOUNTER — Inpatient Hospital Stay: Payer: 59

## 2017-12-04 ENCOUNTER — Encounter: Payer: Self-pay | Admitting: *Deleted

## 2017-12-04 ENCOUNTER — Inpatient Hospital Stay (HOSPITAL_BASED_OUTPATIENT_CLINIC_OR_DEPARTMENT_OTHER): Payer: 59 | Admitting: Hematology and Oncology

## 2017-12-04 DIAGNOSIS — R530 Neoplastic (malignant) related fatigue: Secondary | ICD-10-CM | POA: Diagnosis not present

## 2017-12-04 DIAGNOSIS — C50311 Malignant neoplasm of lower-inner quadrant of right female breast: Secondary | ICD-10-CM

## 2017-12-04 DIAGNOSIS — D6481 Anemia due to antineoplastic chemotherapy: Secondary | ICD-10-CM

## 2017-12-04 DIAGNOSIS — Z95828 Presence of other vascular implants and grafts: Secondary | ICD-10-CM

## 2017-12-04 DIAGNOSIS — Z170 Estrogen receptor positive status [ER+]: Principal | ICD-10-CM

## 2017-12-04 DIAGNOSIS — Z5111 Encounter for antineoplastic chemotherapy: Secondary | ICD-10-CM | POA: Diagnosis not present

## 2017-12-04 LAB — CBC WITH DIFFERENTIAL (CANCER CENTER ONLY)
Basophils Absolute: 0 10*3/uL (ref 0.0–0.1)
Basophils Relative: 1 %
Eosinophils Absolute: 0 10*3/uL (ref 0.0–0.5)
Eosinophils Relative: 1 %
HCT: 29.1 % — ABNORMAL LOW (ref 34.8–46.6)
Hemoglobin: 10.1 g/dL — ABNORMAL LOW (ref 11.6–15.9)
Lymphocytes Relative: 18 %
Lymphs Abs: 0.7 10*3/uL — ABNORMAL LOW (ref 0.9–3.3)
MCH: 33.1 pg (ref 25.1–34.0)
MCHC: 34.8 g/dL (ref 31.5–36.0)
MCV: 95.2 fL (ref 79.5–101.0)
Monocytes Absolute: 0.5 10*3/uL (ref 0.1–0.9)
Monocytes Relative: 13 %
Neutro Abs: 2.6 10*3/uL (ref 1.5–6.5)
Neutrophils Relative %: 67 %
Platelet Count: 226 10*3/uL (ref 145–400)
RBC: 3.06 MIL/uL — ABNORMAL LOW (ref 3.70–5.45)
RDW: 19.9 % — ABNORMAL HIGH (ref 11.2–14.5)
WBC Count: 3.8 10*3/uL — ABNORMAL LOW (ref 3.9–10.3)

## 2017-12-04 LAB — CMP (CANCER CENTER ONLY)
ALT: 20 U/L (ref 0–44)
AST: 20 U/L (ref 15–41)
Albumin: 3.8 g/dL (ref 3.5–5.0)
Alkaline Phosphatase: 70 U/L (ref 38–126)
Anion gap: 5 (ref 5–15)
BUN: 10 mg/dL (ref 6–20)
CO2: 30 mmol/L (ref 22–32)
Calcium: 9.2 mg/dL (ref 8.9–10.3)
Chloride: 104 mmol/L (ref 98–111)
Creatinine: 0.66 mg/dL (ref 0.44–1.00)
GFR, Est AFR Am: >60 mL/min (ref >60)
GFR, Estimated: >60 mL/min (ref >60)
Glucose, Bld: 92 mg/dL (ref 70–99)
Potassium: 4.2 mmol/L (ref 3.5–5.1)
Sodium: 139 mmol/L (ref 135–145)
Total Bilirubin: <0.2 mg/dL — ABNORMAL LOW (ref 0.3–1.2)
Total Protein: 6.2 g/dL — ABNORMAL LOW (ref 6.5–8.1)

## 2017-12-04 MED ORDER — SODIUM CHLORIDE 0.9 % IV SOLN
80.0000 mg/m2 | Freq: Once | INTRAVENOUS | Status: AC
  Administered 2017-12-04: 120 mg via INTRAVENOUS
  Filled 2017-12-04: qty 20

## 2017-12-04 MED ORDER — HEPARIN SOD (PORK) LOCK FLUSH 100 UNIT/ML IV SOLN
500.0000 [IU] | Freq: Once | INTRAVENOUS | Status: AC | PRN
  Administered 2017-12-04: 500 [IU]
  Filled 2017-12-04: qty 5

## 2017-12-04 MED ORDER — SODIUM CHLORIDE 0.9 % IV SOLN
20.0000 mg | Freq: Once | INTRAVENOUS | Status: AC
  Administered 2017-12-04: 20 mg via INTRAVENOUS
  Filled 2017-12-04: qty 2

## 2017-12-04 MED ORDER — SODIUM CHLORIDE 0.9% FLUSH
10.0000 mL | INTRAVENOUS | Status: DC | PRN
  Administered 2017-12-04: 10 mL
  Filled 2017-12-04: qty 10

## 2017-12-04 MED ORDER — FAMOTIDINE IN NACL 20-0.9 MG/50ML-% IV SOLN
INTRAVENOUS | Status: AC
  Filled 2017-12-04: qty 50

## 2017-12-04 MED ORDER — FAMOTIDINE IN NACL 20-0.9 MG/50ML-% IV SOLN
20.0000 mg | Freq: Once | INTRAVENOUS | Status: AC
  Administered 2017-12-04: 20 mg via INTRAVENOUS

## 2017-12-04 MED ORDER — DIPHENHYDRAMINE HCL 50 MG/ML IJ SOLN
50.0000 mg | Freq: Once | INTRAMUSCULAR | Status: AC
  Administered 2017-12-04: 50 mg via INTRAVENOUS

## 2017-12-04 MED ORDER — DIPHENHYDRAMINE HCL 50 MG/ML IJ SOLN
INTRAMUSCULAR | Status: AC
  Filled 2017-12-04: qty 1

## 2017-12-04 MED ORDER — SODIUM CHLORIDE 0.9 % IV SOLN
Freq: Once | INTRAVENOUS | Status: AC
  Administered 2017-12-04: 14:00:00 via INTRAVENOUS
  Filled 2017-12-04: qty 250

## 2017-12-04 NOTE — Assessment & Plan Note (Signed)
08/16/2017: Right lumpectomy: Grade 2 IDC 1.5 cm, with DCIS and necrosis, 0/3 lymph nodes negative, ER 95%, PR 30%, HER-2 negative ratio 1.14, Ki-67 40%, T1CN0 stage Ia Resection of the margins 09/11/2017: Benign  Treatment plan: 1.Systemic chemotherapy with dose dense Adriamycin and Cytoxan x4 followed by Taxol weekly x12 2. adjuvant radiation therapy 3 followed by adjuvant antiestrogen therapy. -------------------------------------------------------------------------------- Current treatment: Completed 4 cycles of dose dense Adriamycin and Cytoxan, today is cycle 3 Taxol    Taxol toxicities:    Monitoring closely for chemo toxicities Return to clinic weekly for chemo and every other week for follow-up with me

## 2017-12-04 NOTE — Patient Instructions (Signed)
[facility] Discharge Instructions for Patients Receiving Chemotherapy  Today you received the following chemotherapy agents:  Taxol.  To help prevent nausea and vomiting after your treatment, we encourage you to take your nausea medication as directed.   If you develop nausea and vomiting that is not controlled by your nausea medication, call the clinic.   BELOW ARE SYMPTOMS THAT SHOULD BE REPORTED IMMEDIATELY:  *FEVER GREATER THAN 100.5 F  *CHILLS WITH OR WITHOUT FEVER  NAUSEA AND VOMITING THAT IS NOT CONTROLLED WITH YOUR NAUSEA MEDICATION  *UNUSUAL SHORTNESS OF BREATH  *UNUSUAL BRUISING OR BLEEDING  TENDERNESS IN MOUTH AND THROAT WITH OR WITHOUT PRESENCE OF ULCERS  *URINARY PROBLEMS  *BOWEL PROBLEMS  UNUSUAL RASH Items with * indicate a potential emergency and should be followed up as soon as possible.  Feel free to call the clinic should you have any questions or concerns. The clinic phone number is [phone].  Please show the CHEMO ALERT CARD at check-in to the Emergency Department and triage nurse.   

## 2017-12-04 NOTE — Progress Notes (Signed)
Patient Care Team: [name] as PCP - General (Family Medicine)  DIAGNOSIS:  Encounter Diagnosis  Name Primary?  . Malignant neoplasm of lower-inner quadrant of right breast of female, estrogen receptor positive ([place])     SUMMARY OF ONCOLOGIC HISTORY:   Malignant neoplasm of lower-inner quadrant of right breast of female, estrogen receptor positive ([place])   08/16/2017 Initial Diagnosis    Right lumpectomy: Grade 2 IDC 1.5 cm, with DCIS and necrosis, 0/3 lymph nodes negative, ER 95%, PR 30%, HER-2 negative ratio 1.14, Ki-67 40%, T1CN0 stage Ia; resection of the margin 09/11/2017: Benign    08/16/2017 Oncotype testing    Oncotype DX recurrence score 31: 19% risk of recurrence at 9 years.  High risk    09/25/2017 -  Chemotherapy    Adjuvant chemotherapy with dose dense Adriamycin and Cytoxan x4 followed by Taxol weekly x12      CHIEF COMPLIANT: Cycle 3 Taxol  INTERVAL HISTORY: Jasmine Allen is a 52 year old with above-mentioned history of right breast cancer treated with lumpectomy and is currently on adjuvant chemotherapy.  Today is cycle 3 of Taxol.  She does have fatigue related to chemotherapy.  Does not have any nausea vomiting.  REVIEW OF SYSTEMS:   Constitutional: Denies fevers, chills or abnormal weight loss Eyes: Denies blurriness of vision Ears, nose, mouth, throat, and face: Denies mucositis or sore throat Respiratory: Denies cough, dyspnea or wheezes Cardiovascular: Denies palpitation, chest discomfort Gastrointestinal:  Denies nausea, heartburn or change in bowel habits Skin: Denies abnormal skin rashes Lymphatics: Denies new lymphadenopathy or easy bruising Neurological:Denies numbness, tingling or new weaknesses Behavioral/Psych: Mood is stable, no new changes  Extremities: No lower extremity edema Breast:  denies any pain or lumps or nodules in either breasts All other systems were reviewed with the patient and are negative.  I have reviewed the  past medical history, past surgical history, social history and family history with the patient and they are unchanged from previous note.  ALLERGIES:  is allergic to amoxicillin-pot clavulanate; erythromycin; metoclopramide hcl; and sulfonamide derivatives.  MEDICATIONS:  Current Outpatient Medications  Medication Sig Dispense Refill  . ACETAMINOPHEN-BUTALBITAL 50-325 MG TABS Take 1 tablet by mouth daily as needed (for headache).   0  . ALPRAZolam (XANAX) 0.5 MG tablet Take 0.5 mg by mouth at bedtime as needed for anxiety or sleep.     . Biotin 10000 MCG TABS Take 10,000 mcg by mouth daily.     . cloNIDine (CATAPRES) 0.3 MG tablet Take 0.3 mg by mouth at bedtime.     . cyclobenzaprine (FLEXERIL) 10 MG tablet Take 10 mg by mouth every 8 (eight) hours as needed for muscle spasms.  1  . furosemide (LASIX) 20 MG tablet Take 20 mg by mouth daily as needed for fluid.  5  . hydrocortisone valerate cream (WESTCORT) 0.2 % Apply 1 application topically daily as needed (eczema).     . hydrOXYzine (ATARAX/VISTARIL) 10 MG tablet Take 10-30 mg by mouth at bedtime as needed for itching.   0  . hyoscyamine (LEVSIN SL) 0.125 MG SL tablet dissolve 1 tablet under the tongue every 4 hours if needed (Patient taking differently: dissolve 1 tablet under the tongue every 4 hours if needed for stomach) 30 tablet 0  . ibuprofen (ADVIL,MOTRIN) 200 MG tablet Take 400-800 mg by mouth every 6 (six) hours as needed for fever, headache or moderate pain.    Marland Kitchen LORazepam (ATIVAN) 0.5 MG tablet TAKE 1 TABLET(0.5 MG) BY MOUTH  AT BEDTIME AS NEEDED FOR NAUSEA OR VOMITING 30 tablet 1  . Melatonin 5 MG CAPS Take 5 mg by mouth at bedtime as needed (for sleep).     . Multiple Vitamin (MULITIVITAMIN WITH MINERALS) TABS Take 1 tablet by mouth daily.    . ondansetron (ZOFRAN) 8 MG tablet Take 1 tablet (8 mg total) by mouth 2 (two) times daily as needed. Start on the third day after chemotherapy. 30 tablet 1  . oxyCODONE-acetaminophen  (PERCOCET/ROXICET) 5-325 MG tablet Take 2 tablets by mouth every 8 (eight) hours as needed for severe pain. 10 tablet 0  . pantoprazole (PROTONIX) 40 MG tablet Take 1 tablet (40 mg total) by mouth 2 (two) times daily before a meal. (Patient taking differently: Take 40 mg by mouth daily. ) 60 tablet 6  . prochlorperazine (COMPAZINE) 10 MG tablet Take 1 tablet (10 mg total) by mouth every 6 (six) hours as needed (Nausea or vomiting). 30 tablet 1  . spironolactone (ALDACTONE) 25 MG tablet Take 25 mg by mouth daily.    . traZODone (DESYREL) 100 MG tablet Take 100 mg by mouth at bedtime.     . valACYclovir (VALTREX) 1000 MG tablet Take 1,000 mg by mouth 2 (two) times daily as needed (for out breaks).      No current facility-administered medications for this visit.    Facility-Administered Medications Ordered in Other Visits  Medication Dose Route Frequency Provider Last Rate Last Dose  . dexamethasone (DECADRON) 20 mg in sodium chloride 0.9 % 50 mL IVPB  20 mg Intravenous Once Nicholas Lose, MD      . famotidine (PEPCID) IVPB 20 mg premix  20 mg Intravenous Once Nicholas Lose, MD      . heparin lock flush 100 unit/mL  500 Units Intracatheter Once PRN Nicholas Lose, MD      . PACLitaxel (TAXOL) 120 mg in sodium chloride 0.9 % 250 mL chemo infusion (</= 48m/m2)  80 mg/m2 (Treatment Plan Recorded) Intravenous Once GNicholas Lose MD      . sodium chloride flush (NS) 0.9 % injection 10 mL  10 mL Intracatheter PRN GNicholas Lose MD        PHYSICAL EXAMINATION: ECOG PERFORMANCE STATUS: 1 - Symptomatic but completely ambulatory  Vitals:   12/04/17 1305  BP: 112/73  Pulse: 80  Resp: 18  Temp: 98.3 F (36.8 C)  SpO2: 100%   Filed Weights   12/04/17 1305  Weight: 119 lb 6.4 oz (54.2 kg)    GENERAL:alert, no distress and comfortable SKIN: skin color, texture, turgor are normal, no rashes or significant lesions EYES: normal, Conjunctiva are pink and non-injected, sclera clear OROPHARYNX:no  exudate, no erythema and lips, buccal mucosa, and tongue normal  NECK: supple, thyroid normal size, non-tender, without nodularity LYMPH:  no palpable lymphadenopathy in the cervical, axillary or inguinal LUNGS: clear to auscultation and percussion with normal breathing effort HEART: regular rate & rhythm and no murmurs and no lower extremity edema ABDOMEN:abdomen soft, non-tender and normal bowel sounds MUSCULOSKELETAL:no cyanosis of digits and no clubbing  NEURO: alert & oriented x 3 with fluent speech, no focal motor/sensory deficits EXTREMITIES: No lower extremity edema  LABORATORY DATA:  I have reviewed the data as listed CMP Latest Ref Rng & Units 12/04/2017 11/27/2017 11/20/2017  Glucose 70 - 99 mg/dL 92 86 92  BUN 6 - 20 mg/dL 10 10 11   Creatinine 0.44 - 1.00 mg/dL 0.66 0.63 0.62  Sodium 135 - 145 mmol/L 139 140 140  Potassium 3.5 -  5.1 mmol/L 4.2 4.1 4.2  Chloride 98 - 111 mmol/L 104 105 107  CO2 22 - 32 mmol/L 30 30 23   Calcium 8.9 - 10.3 mg/dL 9.2 9.4 9.1  Total Protein 6.5 - 8.1 g/dL 6.2(L) 6.6 6.8  Total Bilirubin 0.3 - 1.2 mg/dL <0.2(L) 0.3 0.3  Alkaline Phos 38 - 126 U/L 70 56 77  AST 15 - 41 U/L 20 19 18   ALT 0 - 44 U/L 20 18 13     Lab Results  Component Value Date   WBC 3.8 (L) 12/04/2017   HGB 10.1 (L) 12/04/2017   HCT 29.1 (L) 12/04/2017   MCV 95.2 12/04/2017   PLT 226 12/04/2017   NEUTROABS 2.6 12/04/2017    ASSESSMENT & PLAN:  Malignant neoplasm of lower-inner quadrant of right breast of female, estrogen receptor positive ([place]) 08/16/2017: Right lumpectomy: Grade 2 IDC 1.5 cm, with DCIS and necrosis, 0/3 lymph nodes negative, ER 95%, PR 30%, HER-2 negative ratio 1.14, Ki-67 40%, T1CN0 stage Ia Resection of the margins 09/11/2017: Benign  Treatment plan: 1.Systemic chemotherapy with dose dense Adriamycin and Cytoxan x4 followed by Taxol weekly x12 2. adjuvant radiation therapy 3 followed by adjuvant antiestrogen  therapy. -------------------------------------------------------------------------------- Current treatment: Completed 4 cycles of dose dense Adriamycin and Cytoxan, today is cycle 3 Taxol    Taxol toxicities:  Fatigue due to chemotherapy Chemotherapy-induced anemia: Hemoglobin 10.1  Monitoring closely for chemo toxicities Return to clinic weekly for chemo and every other week for follow-up with me  No orders of the defined types were placed in this encounter.  The patient has a good understanding of the overall plan. she agrees with it. she will call with any problems that may develop before the next visit here.   Harriette Ohara, MD 12/04/17

## 2017-12-05 ENCOUNTER — Telehealth: Payer: Self-pay

## 2017-12-05 NOTE — Telephone Encounter (Signed)
Returned patient's call regarding pre medications prior to chemotherapy on 12/04/2017.  Patient wanted to confirm what pre medications she received.  Patient informed, patient stated, "I just wanted to make sure that I did get the steroid because I just feel more fatigued than normal."  Nurse educated patient on cumulative effects chemotherapy can have.  Nurse encouraged patient to stay active as tolerated.  Patient voiced understanding and has no further needs at this time.

## 2017-12-11 ENCOUNTER — Inpatient Hospital Stay: Payer: 59

## 2017-12-11 ENCOUNTER — Inpatient Hospital Stay: Payer: 59 | Attending: Hematology and Oncology

## 2017-12-11 VITALS — BP 125/74 | HR 82 | Temp 98.2°F | Wt 121.8 lb

## 2017-12-11 DIAGNOSIS — D6481 Anemia due to antineoplastic chemotherapy: Secondary | ICD-10-CM | POA: Insufficient documentation

## 2017-12-11 DIAGNOSIS — R530 Neoplastic (malignant) related fatigue: Secondary | ICD-10-CM | POA: Insufficient documentation

## 2017-12-11 DIAGNOSIS — C50311 Malignant neoplasm of lower-inner quadrant of right female breast: Secondary | ICD-10-CM

## 2017-12-11 DIAGNOSIS — Z5111 Encounter for antineoplastic chemotherapy: Secondary | ICD-10-CM | POA: Insufficient documentation

## 2017-12-11 DIAGNOSIS — Z170 Estrogen receptor positive status [ER+]: Secondary | ICD-10-CM | POA: Diagnosis not present

## 2017-12-11 DIAGNOSIS — R5383 Other fatigue: Secondary | ICD-10-CM | POA: Insufficient documentation

## 2017-12-11 DIAGNOSIS — Z95828 Presence of other vascular implants and grafts: Secondary | ICD-10-CM

## 2017-12-11 LAB — CMP (CANCER CENTER ONLY)
ALT: 22 U/L (ref 0–44)
AST: 21 U/L (ref 15–41)
Albumin: 3.6 g/dL (ref 3.5–5.0)
Alkaline Phosphatase: 53 U/L (ref 38–126)
Anion gap: 8 (ref 5–15)
BUN: 12 mg/dL (ref 6–20)
CO2: 26 mmol/L (ref 22–32)
Calcium: 8.9 mg/dL (ref 8.9–10.3)
Chloride: 106 mmol/L (ref 98–111)
Creatinine: 0.65 mg/dL (ref 0.44–1.00)
GFR, Est AFR Am: >60 mL/min (ref >60)
GFR, Estimated: >60 mL/min (ref >60)
Glucose, Bld: 122 mg/dL — ABNORMAL HIGH (ref 70–99)
Potassium: 4 mmol/L (ref 3.5–5.1)
Sodium: 140 mmol/L (ref 135–145)
Total Bilirubin: <0.2 mg/dL — ABNORMAL LOW (ref 0.3–1.2)
Total Protein: 6 g/dL — ABNORMAL LOW (ref 6.5–8.1)

## 2017-12-11 LAB — CBC WITH DIFFERENTIAL (CANCER CENTER ONLY)
Basophils Absolute: 0 10*3/uL (ref 0.0–0.1)
Basophils Relative: 0 %
Eosinophils Absolute: 0 10*3/uL (ref 0.0–0.5)
Eosinophils Relative: 1 %
HCT: 29.9 % — ABNORMAL LOW (ref 34.8–46.6)
Hemoglobin: 10 g/dL — ABNORMAL LOW (ref 11.6–15.9)
Lymphocytes Relative: 21 %
Lymphs Abs: 0.7 10*3/uL — ABNORMAL LOW (ref 0.9–3.3)
MCH: 32.4 pg (ref 25.1–34.0)
MCHC: 33.4 g/dL (ref 31.5–36.0)
MCV: 96.8 fL (ref 79.5–101.0)
Monocytes Absolute: 0.4 10*3/uL (ref 0.1–0.9)
Monocytes Relative: 11 %
Neutro Abs: 2.1 10*3/uL (ref 1.5–6.5)
Neutrophils Relative %: 67 %
Platelet Count: 196 10*3/uL (ref 145–400)
RBC: 3.09 MIL/uL — ABNORMAL LOW (ref 3.70–5.45)
RDW: 17.6 % — ABNORMAL HIGH (ref 11.2–14.5)
WBC Count: 3.2 10*3/uL — ABNORMAL LOW (ref 3.9–10.3)

## 2017-12-11 MED ORDER — SODIUM CHLORIDE 0.9% FLUSH
10.0000 mL | INTRAVENOUS | Status: DC | PRN
  Administered 2017-12-11: 10 mL
  Filled 2017-12-11: qty 10

## 2017-12-11 MED ORDER — SODIUM CHLORIDE 0.9 % IV SOLN
Freq: Once | INTRAVENOUS | Status: AC
  Administered 2017-12-11: 11:00:00 via INTRAVENOUS
  Filled 2017-12-11: qty 250

## 2017-12-11 MED ORDER — DIPHENHYDRAMINE HCL 50 MG/ML IJ SOLN
INTRAMUSCULAR | Status: AC
  Filled 2017-12-11: qty 1

## 2017-12-11 MED ORDER — FAMOTIDINE IN NACL 20-0.9 MG/50ML-% IV SOLN
INTRAVENOUS | Status: AC
  Filled 2017-12-11: qty 50

## 2017-12-11 MED ORDER — DIPHENHYDRAMINE HCL 50 MG/ML IJ SOLN
50.0000 mg | Freq: Once | INTRAMUSCULAR | Status: AC
  Administered 2017-12-11: 50 mg via INTRAVENOUS

## 2017-12-11 MED ORDER — SODIUM CHLORIDE 0.9 % IV SOLN
80.0000 mg/m2 | Freq: Once | INTRAVENOUS | Status: AC
  Administered 2017-12-11: 120 mg via INTRAVENOUS
  Filled 2017-12-11: qty 20

## 2017-12-11 MED ORDER — HEPARIN SOD (PORK) LOCK FLUSH 100 UNIT/ML IV SOLN
500.0000 [IU] | Freq: Once | INTRAVENOUS | Status: AC | PRN
  Administered 2017-12-11: 500 [IU]
  Filled 2017-12-11: qty 5

## 2017-12-11 MED ORDER — SODIUM CHLORIDE 0.9 % IV SOLN
20.0000 mg | Freq: Once | INTRAVENOUS | Status: AC
  Administered 2017-12-11: 20 mg via INTRAVENOUS
  Filled 2017-12-11: qty 2

## 2017-12-11 MED ORDER — FAMOTIDINE IN NACL 20-0.9 MG/50ML-% IV SOLN
20.0000 mg | Freq: Once | INTRAVENOUS | Status: AC
  Administered 2017-12-11: 20 mg via INTRAVENOUS

## 2017-12-11 NOTE — Patient Instructions (Signed)
[facility] Discharge Instructions for Patients Receiving Chemotherapy  Today you received the following chemotherapy agents:  Taxol.  To help prevent nausea and vomiting after your treatment, we encourage you to take your nausea medication as directed.   If you develop nausea and vomiting that is not controlled by your nausea medication, call the clinic.   BELOW ARE SYMPTOMS THAT SHOULD BE REPORTED IMMEDIATELY:  *FEVER GREATER THAN 100.5 F  *CHILLS WITH OR WITHOUT FEVER  NAUSEA AND VOMITING THAT IS NOT CONTROLLED WITH YOUR NAUSEA MEDICATION  *UNUSUAL SHORTNESS OF BREATH  *UNUSUAL BRUISING OR BLEEDING  TENDERNESS IN MOUTH AND THROAT WITH OR WITHOUT PRESENCE OF ULCERS  *URINARY PROBLEMS  *BOWEL PROBLEMS  UNUSUAL RASH Items with * indicate a potential emergency and should be followed up as soon as possible.  Feel free to call the clinic should you have any questions or concerns. The clinic phone number is [phone].  Please show the CHEMO ALERT CARD at check-in to the Emergency Department and triage nurse.   

## 2017-12-18 ENCOUNTER — Inpatient Hospital Stay (HOSPITAL_BASED_OUTPATIENT_CLINIC_OR_DEPARTMENT_OTHER): Payer: 59 | Admitting: Hematology and Oncology

## 2017-12-18 ENCOUNTER — Inpatient Hospital Stay: Payer: 59

## 2017-12-18 DIAGNOSIS — C50311 Malignant neoplasm of lower-inner quadrant of right female breast: Secondary | ICD-10-CM

## 2017-12-18 DIAGNOSIS — R530 Neoplastic (malignant) related fatigue: Secondary | ICD-10-CM

## 2017-12-18 DIAGNOSIS — Z170 Estrogen receptor positive status [ER+]: Principal | ICD-10-CM

## 2017-12-18 DIAGNOSIS — Z95828 Presence of other vascular implants and grafts: Secondary | ICD-10-CM

## 2017-12-18 DIAGNOSIS — Z5111 Encounter for antineoplastic chemotherapy: Secondary | ICD-10-CM | POA: Diagnosis not present

## 2017-12-18 DIAGNOSIS — D6481 Anemia due to antineoplastic chemotherapy: Secondary | ICD-10-CM | POA: Diagnosis not present

## 2017-12-18 LAB — CBC WITH DIFFERENTIAL (CANCER CENTER ONLY)
Basophils Absolute: 0 10*3/uL (ref 0.0–0.1)
Basophils Relative: 1 %
Eosinophils Absolute: 0 10*3/uL (ref 0.0–0.5)
Eosinophils Relative: 1 %
HCT: 33.1 % — ABNORMAL LOW (ref 34.8–46.6)
Hemoglobin: 11.4 g/dL — ABNORMAL LOW (ref 11.6–15.9)
Lymphocytes Relative: 18 %
Lymphs Abs: 0.6 10*3/uL — ABNORMAL LOW (ref 0.9–3.3)
MCH: 33.6 pg (ref 25.1–34.0)
MCHC: 34.5 g/dL (ref 31.5–36.0)
MCV: 97.3 fL (ref 79.5–101.0)
Monocytes Absolute: 0.4 10*3/uL (ref 0.1–0.9)
Monocytes Relative: 10 %
Neutro Abs: 2.5 10*3/uL (ref 1.5–6.5)
Neutrophils Relative %: 70 %
Platelet Count: 254 10*3/uL (ref 145–400)
RBC: 3.4 MIL/uL — ABNORMAL LOW (ref 3.70–5.45)
RDW: 18.8 % — ABNORMAL HIGH (ref 11.2–14.5)
WBC Count: 3.6 10*3/uL — ABNORMAL LOW (ref 3.9–10.3)

## 2017-12-18 LAB — CMP (CANCER CENTER ONLY)
ALT: 18 U/L (ref 0–44)
AST: 19 U/L (ref 15–41)
Albumin: 3.8 g/dL (ref 3.5–5.0)
Alkaline Phosphatase: 62 U/L (ref 38–126)
Anion gap: 8 (ref 5–15)
BUN: 12 mg/dL (ref 6–20)
CO2: 24 mmol/L (ref 22–32)
Calcium: 9.2 mg/dL (ref 8.9–10.3)
Chloride: 105 mmol/L (ref 98–111)
Creatinine: 0.68 mg/dL (ref 0.44–1.00)
GFR, Est AFR Am: >60 mL/min (ref >60)
GFR, Estimated: >60 mL/min (ref >60)
Glucose, Bld: 113 mg/dL — ABNORMAL HIGH (ref 70–99)
Potassium: 4.4 mmol/L (ref 3.5–5.1)
Sodium: 137 mmol/L (ref 135–145)
Total Bilirubin: 0.3 mg/dL (ref 0.3–1.2)
Total Protein: 6.4 g/dL — ABNORMAL LOW (ref 6.5–8.1)

## 2017-12-18 MED ORDER — HEPARIN SOD (PORK) LOCK FLUSH 100 UNIT/ML IV SOLN
500.0000 [IU] | Freq: Once | INTRAVENOUS | Status: AC | PRN
  Administered 2017-12-18: 500 [IU]
  Filled 2017-12-18: qty 5

## 2017-12-18 MED ORDER — DIPHENHYDRAMINE HCL 50 MG/ML IJ SOLN
INTRAMUSCULAR | Status: AC
  Filled 2017-12-18: qty 1

## 2017-12-18 MED ORDER — FAMOTIDINE IN NACL 20-0.9 MG/50ML-% IV SOLN
INTRAVENOUS | Status: AC
  Filled 2017-12-18: qty 50

## 2017-12-18 MED ORDER — PROCHLORPERAZINE MALEATE 10 MG PO TABS: 10.0000 mg | ORAL_TABLET | Freq: Four times a day (QID) | ORAL | 1 refills | Status: DC | PRN

## 2017-12-18 MED ORDER — SODIUM CHLORIDE 0.9 % IV SOLN
Freq: Once | INTRAVENOUS | Status: AC
  Administered 2017-12-18: 13:00:00 via INTRAVENOUS
  Filled 2017-12-18: qty 250

## 2017-12-18 MED ORDER — OXYCODONE-ACETAMINOPHEN 5-325 MG PO TABS: 2.0000 | ORAL_TABLET | Freq: Three times a day (TID) | ORAL | 0 refills | Status: DC | PRN

## 2017-12-18 MED ORDER — SODIUM CHLORIDE 0.9% FLUSH
10.0000 mL | INTRAVENOUS | Status: DC | PRN
  Administered 2017-12-18: 10 mL
  Filled 2017-12-18: qty 10

## 2017-12-18 MED ORDER — SODIUM CHLORIDE 0.9 % IV SOLN
80.0000 mg/m2 | Freq: Once | INTRAVENOUS | Status: AC
  Administered 2017-12-18: 120 mg via INTRAVENOUS
  Filled 2017-12-18: qty 20

## 2017-12-18 MED ORDER — FAMOTIDINE IN NACL 20-0.9 MG/50ML-% IV SOLN
20.0000 mg | Freq: Once | INTRAVENOUS | Status: AC
  Administered 2017-12-18: 20 mg via INTRAVENOUS

## 2017-12-18 MED ORDER — DIPHENHYDRAMINE HCL 50 MG/ML IJ SOLN
50.0000 mg | Freq: Once | INTRAMUSCULAR | Status: AC
  Administered 2017-12-18: 50 mg via INTRAVENOUS

## 2017-12-18 MED ORDER — SODIUM CHLORIDE 0.9 % IV SOLN
20.0000 mg | Freq: Once | INTRAVENOUS | Status: AC
  Administered 2017-12-18: 20 mg via INTRAVENOUS
  Filled 2017-12-18: qty 2

## 2017-12-18 NOTE — Patient Instructions (Signed)
[facility] Discharge Instructions for Patients Receiving Chemotherapy  Today you received the following chemotherapy agents:  Taxol.  To help prevent nausea and vomiting after your treatment, we encourage you to take your nausea medication as directed.   If you develop nausea and vomiting that is not controlled by your nausea medication, call the clinic.   BELOW ARE SYMPTOMS THAT SHOULD BE REPORTED IMMEDIATELY:  *FEVER GREATER THAN 100.5 F  *CHILLS WITH OR WITHOUT FEVER  NAUSEA AND VOMITING THAT IS NOT CONTROLLED WITH YOUR NAUSEA MEDICATION  *UNUSUAL SHORTNESS OF BREATH  *UNUSUAL BRUISING OR BLEEDING  TENDERNESS IN MOUTH AND THROAT WITH OR WITHOUT PRESENCE OF ULCERS  *URINARY PROBLEMS  *BOWEL PROBLEMS  UNUSUAL RASH Items with * indicate a potential emergency and should be followed up as soon as possible.  Feel free to call the clinic should you have any questions or concerns. The clinic phone number is [phone].  Please show the CHEMO ALERT CARD at check-in to the Emergency Department and triage nurse.   

## 2017-12-18 NOTE — Progress Notes (Signed)
Patient Care Team: [name] as PCP - General (Family Medicine)  DIAGNOSIS:  Encounter Diagnosis  Name Primary?  . Malignant neoplasm of lower-inner quadrant of right breast of female, estrogen receptor positive ([place])     SUMMARY OF ONCOLOGIC HISTORY:   Malignant neoplasm of lower-inner quadrant of right breast of female, estrogen receptor positive ([place])   08/16/2017 Initial Diagnosis    Right lumpectomy: Grade 2 IDC 1.5 cm, with DCIS and necrosis, 0/3 lymph nodes negative, ER 95%, PR 30%, HER-2 negative ratio 1.14, Ki-67 40%, T1CN0 stage Ia; resection of the margin 09/11/2017: Benign    08/16/2017 Oncotype testing    Oncotype DX recurrence score 31: 19% risk of recurrence at 9 years.  High risk    09/25/2017 -  Chemotherapy    Adjuvant chemotherapy with dose dense Adriamycin and Cytoxan x4 followed by Taxol weekly x12      CHIEF COMPLIANT: Cycle 5 Taxol  INTERVAL HISTORY: Jasmine Allen is a 52 year old with above-mentioned history of right breast cancer treated with lumpectomy and is on adjuvant chemotherapy today cycle 5 of Taxol.  She is experiencing fatigue intermittently.  Denies any nausea vomiting.  She is requesting a refill of her Percocets because she gets bone pain after treatment.  She uses it very sparingly.  REVIEW OF SYSTEMS:   Constitutional: Denies fevers, chills or abnormal weight loss Eyes: Denies blurriness of vision Ears, nose, mouth, throat, and face: Denies mucositis or sore throat Respiratory: Denies cough, dyspnea or wheezes Cardiovascular: Denies palpitation, chest discomfort Gastrointestinal:  Denies nausea, heartburn or change in bowel habits Skin: Denies abnormal skin rashes Lymphatics: Denies new lymphadenopathy or easy bruising Neurological:Denies numbness, tingling or new weaknesses Behavioral/Psych: Mood is stable, no new changes  Extremities: No lower extremity edema  All other systems were reviewed with the patient and are  negative.  I have reviewed the past medical history, past surgical history, social history and family history with the patient and they are unchanged from previous note.  ALLERGIES:  is allergic to amoxicillin-pot clavulanate; erythromycin; metoclopramide hcl; and sulfonamide derivatives.  MEDICATIONS:  Current Outpatient Medications  Medication Sig Dispense Refill  . ACETAMINOPHEN-BUTALBITAL 50-325 MG TABS Take 1 tablet by mouth daily as needed (for headache).   0  . ALPRAZolam (XANAX) 0.5 MG tablet Take 0.5 mg by mouth at bedtime as needed for anxiety or sleep.     . Biotin 10000 MCG TABS Take 10,000 mcg by mouth daily.     . cloNIDine (CATAPRES) 0.3 MG tablet Take 0.3 mg by mouth at bedtime.     . cyclobenzaprine (FLEXERIL) 10 MG tablet Take 10 mg by mouth every 8 (eight) hours as needed for muscle spasms.  1  . furosemide (LASIX) 20 MG tablet Take 20 mg by mouth daily as needed for fluid.  5  . hydrocortisone valerate cream (WESTCORT) 0.2 % Apply 1 application topically daily as needed (eczema).     . hydrOXYzine (ATARAX/VISTARIL) 10 MG tablet Take 10-30 mg by mouth at bedtime as needed for itching.   0  . hyoscyamine (LEVSIN SL) 0.125 MG SL tablet dissolve 1 tablet under the tongue every 4 hours if needed (Patient taking differently: dissolve 1 tablet under the tongue every 4 hours if needed for stomach) 30 tablet 0  . ibuprofen (ADVIL,MOTRIN) 200 MG tablet Take 400-800 mg by mouth every 6 (six) hours as needed for fever, headache or moderate pain.    Marland Kitchen LORazepam (ATIVAN) 0.5 MG tablet TAKE 1  TABLET(0.5 MG) BY MOUTH AT BEDTIME AS NEEDED FOR NAUSEA OR VOMITING 30 tablet 1  . Melatonin 5 MG CAPS Take 5 mg by mouth at bedtime as needed (for sleep).     . Multiple Vitamin (MULITIVITAMIN WITH MINERALS) TABS Take 1 tablet by mouth daily.    . ondansetron (ZOFRAN) 8 MG tablet Take 1 tablet (8 mg total) by mouth 2 (two) times daily as needed. Start on the third day after chemotherapy. 30 tablet 1    . oxyCODONE-acetaminophen (PERCOCET/ROXICET) 5-325 MG tablet Take 2 tablets by mouth every 8 (eight) hours as needed for severe pain. 10 tablet 0  . pantoprazole (PROTONIX) 40 MG tablet Take 1 tablet (40 mg total) by mouth 2 (two) times daily before a meal. (Patient taking differently: Take 40 mg by mouth daily. ) 60 tablet 6  . prochlorperazine (COMPAZINE) 10 MG tablet Take 1 tablet (10 mg total) by mouth every 6 (six) hours as needed (Nausea or vomiting). 30 tablet 1  . spironolactone (ALDACTONE) 25 MG tablet Take 25 mg by mouth daily.    . traZODone (DESYREL) 100 MG tablet Take 100 mg by mouth at bedtime.     . valACYclovir (VALTREX) 1000 MG tablet Take 1,000 mg by mouth 2 (two) times daily as needed (for out breaks).      No current facility-administered medications for this visit.    Facility-Administered Medications Ordered in Other Visits  Medication Dose Route Frequency Provider Last Rate Last Dose  . dexamethasone (DECADRON) 20 mg in sodium chloride 0.9 % 50 mL IVPB  20 mg Intravenous Once Nicholas Lose, MD      . heparin lock flush 100 unit/mL  500 Units Intracatheter Once PRN Nicholas Lose, MD      . PACLitaxel (TAXOL) 120 mg in sodium chloride 0.9 % 250 mL chemo infusion (</= 49m/m2)  80 mg/m2 (Treatment Plan Recorded) Intravenous Once GNicholas Lose MD      . sodium chloride flush (NS) 0.9 % injection 10 mL  10 mL Intracatheter PRN GNicholas Lose MD        PHYSICAL EXAMINATION: ECOG PERFORMANCE STATUS: 1 - Symptomatic but completely ambulatory  Vitals:   12/18/17 1050  BP: 124/79  Pulse: 90  Resp: 18  Temp: 98.2 F (36.8 C)  SpO2: 100%   Filed Weights   12/18/17 1050  Weight: 119 lb 1.6 oz (54 kg)    GENERAL:alert, no distress and comfortable SKIN: skin color, texture, turgor are normal, no rashes or significant lesions EYES: normal, Conjunctiva are pink and non-injected, sclera clear OROPHARYNX:no exudate, no erythema and lips, buccal mucosa, and tongue normal   NECK: supple, thyroid normal size, non-tender, without nodularity LYMPH:  no palpable lymphadenopathy in the cervical, axillary or inguinal LUNGS: clear to auscultation and percussion with normal breathing effort HEART: regular rate & rhythm and no murmurs and no lower extremity edema ABDOMEN:abdomen soft, non-tender and normal bowel sounds MUSCULOSKELETAL:no cyanosis of digits and no clubbing  NEURO: alert & oriented x 3 with fluent speech, no focal motor/sensory deficits EXTREMITIES: No lower extremity edema  LABORATORY DATA:  I have reviewed the data as listed CMP Latest Ref Rng & Units 12/18/2017 12/11/2017 12/04/2017  Glucose 70 - 99 mg/dL 113(H) 122(H) 92  BUN 6 - 20 mg/dL _0 Creatinine 0.44 - 1.00 mg/dL 0.68 0.65 0.66  Sodium 135 - 145 mmol/L 137 140 139  Potassium 3.5 - 5.1 mmol/L 4.4 4.0 4.2  Chloride 98 - 111 mmol/L 105 106 104  CO2 22 - 32 mmol/L _0 Calcium 8.9 - 10.3 mg/dL 9.2 8.9 9.2  Total Protein 6.5 - 8.1 g/dL 6.4(L) 6.0(L) 6.2(L)  Total Bilirubin 0.3 - 1.2 mg/dL 0.3 <0.2(L) <0.2(L)  Alkaline Phos 38 - 126 U/L 62 53 70  AST 15 - 41 U/L _1 ALT 0 - 44 U/L _2 Lab Results  Component Value Date   WBC 3.6 (L) 12/18/2017   HGB 11.4 (L) 12/18/2017   HCT 33.1 (L) 12/18/2017   MCV 97.3 12/18/2017   PLT 254 12/18/2017   NEUTROABS 2.5 12/18/2017    ASSESSMENT & PLAN:  Malignant neoplasm of lower-inner quadrant of right breast of female, estrogen receptor positive ([place]) 08/16/2017: Right lumpectomy: Grade 2 IDC 1.5 cm, with DCIS and necrosis, 0/3 lymph nodes negative, ER 95%, PR 30%, HER-2 negative ratio 1.14, Ki-67 40%, T1CN0 stage Ia Resection of the margins 09/11/2017: Benign  Treatment plan: 1.Systemic chemotherapy with dose dense Adriamycin and Cytoxan x4 followed by Taxol weekly x12 2. adjuvant radiation therapy 3 followed by adjuvant antiestrogen  therapy. -------------------------------------------------------------------------------- Current treatment:Completed 4 cycles ofdose dense Adriamycin and Cytoxan, today is cycle 5 Taxol  Taxol toxicities: Fatigue due to chemotherapy Chemotherapy-induced anemia: Hemoglobin 11.4  Monitoring closely for chemo toxicities Return to clinic weekly for chemo and every other week for follow-up with me  No orders of the defined types were placed in this encounter.  The patient has a good understanding of the overall plan. she agrees with it. she will call with any problems that may develop before the next visit here.   Harriette Ohara, MD 12/18/17

## 2017-12-18 NOTE — Assessment & Plan Note (Signed)
08/16/2017: Right lumpectomy: Grade 2 IDC 1.5 cm, with DCIS and necrosis, 0/3 lymph nodes negative, ER 95%, PR 30%, HER-2 negative ratio 1.14, Ki-67 40%, T1CN0 stage Ia Resection of the margins 09/11/2017: Benign  Treatment plan: 1.Systemic chemotherapy with dose dense Adriamycin and Cytoxan x4 followed by Taxol weekly x12 2. adjuvant radiation therapy 3 followed by adjuvant antiestrogen therapy. -------------------------------------------------------------------------------- Current treatment:Completed 4 cycles ofdose dense Adriamycin and Cytoxan, today is cycle 5 Taxol  Taxol toxicities: Fatigue due to chemotherapy Chemotherapy-induced anemia: Hemoglobin 10.1  Monitoring closely for chemo toxicities Return to clinic weekly for chemo and every other week for follow-up with me

## 2017-12-25 ENCOUNTER — Other Ambulatory Visit: Payer: 59

## 2017-12-25 ENCOUNTER — Inpatient Hospital Stay: Payer: 59

## 2017-12-25 ENCOUNTER — Ambulatory Visit: Payer: 59

## 2017-12-25 ENCOUNTER — Ambulatory Visit: Payer: 59 | Admitting: Hematology and Oncology

## 2017-12-25 VITALS — BP 121/77 | HR 84 | Temp 97.7°F | Resp 18 | Ht 60.0 in | Wt 120.1 lb

## 2017-12-25 DIAGNOSIS — Z170 Estrogen receptor positive status [ER+]: Principal | ICD-10-CM

## 2017-12-25 DIAGNOSIS — Z95828 Presence of other vascular implants and grafts: Secondary | ICD-10-CM

## 2017-12-25 DIAGNOSIS — C50311 Malignant neoplasm of lower-inner quadrant of right female breast: Secondary | ICD-10-CM

## 2017-12-25 DIAGNOSIS — Z5111 Encounter for antineoplastic chemotherapy: Secondary | ICD-10-CM | POA: Diagnosis not present

## 2017-12-25 LAB — CMP (CANCER CENTER ONLY)
ALT: 16 U/L (ref 0–44)
AST: 20 U/L (ref 15–41)
Albumin: 3.8 g/dL (ref 3.5–5.0)
Alkaline Phosphatase: 60 U/L (ref 38–126)
Anion gap: 6 (ref 5–15)
BUN: 8 mg/dL (ref 6–20)
CO2: 27 mmol/L (ref 22–32)
Calcium: 9.3 mg/dL (ref 8.9–10.3)
Chloride: 107 mmol/L (ref 98–111)
Creatinine: 0.64 mg/dL (ref 0.44–1.00)
GFR, Est AFR Am: >60 mL/min (ref >60)
GFR, Estimated: >60 mL/min (ref >60)
Glucose, Bld: 85 mg/dL (ref 70–99)
Potassium: 4.4 mmol/L (ref 3.5–5.1)
Sodium: 140 mmol/L (ref 135–145)
Total Bilirubin: 0.3 mg/dL (ref 0.3–1.2)
Total Protein: 6.4 g/dL — ABNORMAL LOW (ref 6.5–8.1)

## 2017-12-25 LAB — CBC WITH DIFFERENTIAL (CANCER CENTER ONLY)
Basophils Absolute: 0 10*3/uL (ref 0.0–0.1)
Basophils Relative: 0 %
Eosinophils Absolute: 0.1 10*3/uL (ref 0.0–0.5)
Eosinophils Relative: 1 %
HCT: 33.4 % — ABNORMAL LOW (ref 34.8–46.6)
Hemoglobin: 11.3 g/dL — ABNORMAL LOW (ref 11.6–15.9)
Lymphocytes Relative: 17 %
Lymphs Abs: 0.9 10*3/uL (ref 0.9–3.3)
MCH: 33.4 pg (ref 25.1–34.0)
MCHC: 33.8 g/dL (ref 31.5–36.0)
MCV: 98.8 fL (ref 79.5–101.0)
Monocytes Absolute: 0.5 10*3/uL (ref 0.1–0.9)
Monocytes Relative: 10 %
Neutro Abs: 3.7 10*3/uL (ref 1.5–6.5)
Neutrophils Relative %: 72 %
Platelet Count: 186 10*3/uL (ref 145–400)
RBC: 3.38 MIL/uL — ABNORMAL LOW (ref 3.70–5.45)
RDW: 16.5 % — ABNORMAL HIGH (ref 11.2–14.5)
WBC Count: 5.2 10*3/uL (ref 3.9–10.3)

## 2017-12-25 MED ORDER — DIPHENHYDRAMINE HCL 50 MG/ML IJ SOLN
50.0000 mg | Freq: Once | INTRAMUSCULAR | Status: AC
  Administered 2017-12-25: 50 mg via INTRAVENOUS

## 2017-12-25 MED ORDER — DIPHENHYDRAMINE HCL 50 MG/ML IJ SOLN
INTRAMUSCULAR | Status: AC
  Filled 2017-12-25: qty 1

## 2017-12-25 MED ORDER — FAMOTIDINE IN NACL 20-0.9 MG/50ML-% IV SOLN
20.0000 mg | Freq: Once | INTRAVENOUS | Status: AC
  Administered 2017-12-25: 20 mg via INTRAVENOUS

## 2017-12-25 MED ORDER — SODIUM CHLORIDE 0.9 % IV SOLN
20.0000 mg | Freq: Once | INTRAVENOUS | Status: AC
  Administered 2017-12-25: 20 mg via INTRAVENOUS
  Filled 2017-12-25: qty 2

## 2017-12-25 MED ORDER — FAMOTIDINE IN NACL 20-0.9 MG/50ML-% IV SOLN
INTRAVENOUS | Status: AC
  Filled 2017-12-25: qty 50

## 2017-12-25 MED ORDER — SODIUM CHLORIDE 0.9 % IV SOLN
80.0000 mg/m2 | Freq: Once | INTRAVENOUS | Status: AC
  Administered 2017-12-25: 120 mg via INTRAVENOUS
  Filled 2017-12-25: qty 20

## 2017-12-25 MED ORDER — SODIUM CHLORIDE 0.9% FLUSH
10.0000 mL | INTRAVENOUS | Status: DC | PRN
  Administered 2017-12-25: 10 mL
  Filled 2017-12-25: qty 10

## 2017-12-25 MED ORDER — SODIUM CHLORIDE 0.9 % IV SOLN
Freq: Once | INTRAVENOUS | Status: AC
  Administered 2017-12-25: 14:00:00 via INTRAVENOUS
  Filled 2017-12-25: qty 250

## 2017-12-25 MED ORDER — HEPARIN SOD (PORK) LOCK FLUSH 100 UNIT/ML IV SOLN
500.0000 [IU] | Freq: Once | INTRAVENOUS | Status: AC | PRN
  Administered 2017-12-25: 500 [IU]
  Filled 2017-12-25: qty 5

## 2017-12-25 NOTE — Patient Instructions (Signed)
[facility] Discharge Instructions for Patients Receiving Chemotherapy  Today you received the following chemotherapy agents :  Taxol.  To help prevent nausea and vomiting after your treatment, we encourage you to take your nausea medication as prescribed.   If you develop nausea and vomiting that is not controlled by your nausea medication, call the clinic.   BELOW ARE SYMPTOMS THAT SHOULD BE REPORTED IMMEDIATELY:  *FEVER GREATER THAN 100.5 F  *CHILLS WITH OR WITHOUT FEVER  NAUSEA AND VOMITING THAT IS NOT CONTROLLED WITH YOUR NAUSEA MEDICATION  *UNUSUAL SHORTNESS OF BREATH  *UNUSUAL BRUISING OR BLEEDING  TENDERNESS IN MOUTH AND THROAT WITH OR WITHOUT PRESENCE OF ULCERS  *URINARY PROBLEMS  *BOWEL PROBLEMS  UNUSUAL RASH Items with * indicate a potential emergency and should be followed up as soon as possible.  Feel free to call the clinic should you have any questions or concerns. The clinic phone number is [phone].  Please show the CHEMO ALERT CARD at check-in to the Emergency Department and triage nurse.   

## 2018-01-01 ENCOUNTER — Inpatient Hospital Stay: Payer: 59

## 2018-01-01 ENCOUNTER — Ambulatory Visit: Payer: 59

## 2018-01-01 ENCOUNTER — Encounter: Payer: Self-pay | Admitting: *Deleted

## 2018-01-01 ENCOUNTER — Inpatient Hospital Stay (HOSPITAL_BASED_OUTPATIENT_CLINIC_OR_DEPARTMENT_OTHER): Payer: 59 | Admitting: Hematology and Oncology

## 2018-01-01 ENCOUNTER — Other Ambulatory Visit: Payer: 59

## 2018-01-01 DIAGNOSIS — Z170 Estrogen receptor positive status [ER+]: Principal | ICD-10-CM

## 2018-01-01 DIAGNOSIS — Z95828 Presence of other vascular implants and grafts: Secondary | ICD-10-CM

## 2018-01-01 DIAGNOSIS — D6481 Anemia due to antineoplastic chemotherapy: Secondary | ICD-10-CM

## 2018-01-01 DIAGNOSIS — C50311 Malignant neoplasm of lower-inner quadrant of right female breast: Secondary | ICD-10-CM

## 2018-01-01 DIAGNOSIS — R530 Neoplastic (malignant) related fatigue: Secondary | ICD-10-CM

## 2018-01-01 DIAGNOSIS — Z5111 Encounter for antineoplastic chemotherapy: Secondary | ICD-10-CM | POA: Diagnosis not present

## 2018-01-01 DIAGNOSIS — L708 Other acne: Secondary | ICD-10-CM

## 2018-01-01 LAB — CBC WITH DIFFERENTIAL (CANCER CENTER ONLY)
Basophils Absolute: 0 10*3/uL (ref 0.0–0.1)
Basophils Relative: 0 %
Eosinophils Absolute: 0 10*3/uL (ref 0.0–0.5)
Eosinophils Relative: 1 %
HCT: 34.9 % (ref 34.8–46.6)
Hemoglobin: 12 g/dL (ref 11.6–15.9)
Lymphocytes Relative: 18 %
Lymphs Abs: 0.9 10*3/uL (ref 0.9–3.3)
MCH: 33.7 pg (ref 25.1–34.0)
MCHC: 34.4 g/dL (ref 31.5–36.0)
MCV: 98 fL (ref 79.5–101.0)
Monocytes Absolute: 0.6 10*3/uL (ref 0.1–0.9)
Monocytes Relative: 11 %
Neutro Abs: 3.6 10*3/uL (ref 1.5–6.5)
Neutrophils Relative %: 70 %
Platelet Count: 209 10*3/uL (ref 145–400)
RBC: 3.56 MIL/uL — ABNORMAL LOW (ref 3.70–5.45)
RDW: 15 % — ABNORMAL HIGH (ref 11.2–14.5)
WBC Count: 5.2 10*3/uL (ref 3.9–10.3)

## 2018-01-01 LAB — CMP (CANCER CENTER ONLY)
ALT: 15 U/L (ref 0–44)
AST: 15 U/L (ref 15–41)
Albumin: 3.8 g/dL (ref 3.5–5.0)
Alkaline Phosphatase: 64 U/L (ref 38–126)
Anion gap: 7 (ref 5–15)
BUN: 13 mg/dL (ref 6–20)
CO2: 27 mmol/L (ref 22–32)
Calcium: 9.4 mg/dL (ref 8.9–10.3)
Chloride: 104 mmol/L (ref 98–111)
Creatinine: 0.7 mg/dL (ref 0.44–1.00)
GFR, Est AFR Am: >60 mL/min (ref >60)
GFR, Estimated: >60 mL/min (ref >60)
Glucose, Bld: 111 mg/dL — ABNORMAL HIGH (ref 70–99)
Potassium: 4.3 mmol/L (ref 3.5–5.1)
Sodium: 138 mmol/L (ref 135–145)
Total Bilirubin: 0.2 mg/dL — ABNORMAL LOW (ref 0.3–1.2)
Total Protein: 6.5 g/dL (ref 6.5–8.1)

## 2018-01-01 MED ORDER — SODIUM CHLORIDE 0.9% FLUSH
3.0000 mL | INTRAVENOUS | Status: DC | PRN
  Filled 2018-01-01: qty 10

## 2018-01-01 MED ORDER — SODIUM CHLORIDE 0.9 % IV SOLN
Freq: Once | INTRAVENOUS | Status: AC
  Administered 2018-01-01: 13:00:00 via INTRAVENOUS
  Filled 2018-01-01: qty 250

## 2018-01-01 MED ORDER — DIPHENHYDRAMINE HCL 50 MG/ML IJ SOLN
INTRAMUSCULAR | Status: AC
  Filled 2018-01-01: qty 1

## 2018-01-01 MED ORDER — FAMOTIDINE IN NACL 20-0.9 MG/50ML-% IV SOLN
20.0000 mg | Freq: Once | INTRAVENOUS | Status: AC
  Administered 2018-01-01: 20 mg via INTRAVENOUS

## 2018-01-01 MED ORDER — SODIUM CHLORIDE 0.9% FLUSH
10.0000 mL | INTRAVENOUS | Status: DC | PRN
  Administered 2018-01-01: 10 mL
  Filled 2018-01-01: qty 10

## 2018-01-01 MED ORDER — HEPARIN SOD (PORK) LOCK FLUSH 100 UNIT/ML IV SOLN
500.0000 [IU] | Freq: Once | INTRAVENOUS | Status: AC | PRN
  Administered 2018-01-01: 500 [IU]
  Filled 2018-01-01: qty 5

## 2018-01-01 MED ORDER — DIPHENHYDRAMINE HCL 50 MG/ML IJ SOLN
50.0000 mg | Freq: Once | INTRAMUSCULAR | Status: AC
  Administered 2018-01-01: 50 mg via INTRAVENOUS

## 2018-01-01 MED ORDER — SODIUM CHLORIDE 0.9 % IV SOLN
80.0000 mg/m2 | Freq: Once | INTRAVENOUS | Status: AC
  Administered 2018-01-01: 120 mg via INTRAVENOUS
  Filled 2018-01-01: qty 20

## 2018-01-01 MED ORDER — FAMOTIDINE IN NACL 20-0.9 MG/50ML-% IV SOLN
INTRAVENOUS | Status: AC
  Filled 2018-01-01: qty 50

## 2018-01-01 NOTE — Assessment & Plan Note (Signed)
08/16/2017: Right lumpectomy: Grade 2 IDC 1.5 cm, with DCIS and necrosis, 0/3 lymph nodes negative, ER 95%, PR 30%, HER-2 negative ratio 1.14, Ki-67 40%, T1CN0 stage Ia Resection of the margins 09/11/2017: Benign  Treatment plan: 1.Systemic chemotherapy with dose dense Adriamycin and Cytoxan x4 followed by Taxol weekly x12 2. adjuvant radiation therapy 3 followed by adjuvant antiestrogen therapy. -------------------------------------------------------------------------------- Current treatment:Completed 4 cycles ofdose dense Adriamycin and Cytoxan, today is cycle7Taxol  Taxoltoxicities: Fatigue due to chemotherapy Chemotherapy-induced anemia: Hemoglobin 11.4  Monitoring closely for chemo toxicities Return to clinic weekly for chemo and every other week for follow-up with me

## 2018-01-01 NOTE — Patient Instructions (Signed)
[facility] Discharge Instructions for Patients Receiving Chemotherapy  Today you received the following chemotherapy agents:  Taxol.  To help prevent nausea and vomiting after your treatment, we encourage you to take your nausea medication as directed.   If you develop nausea and vomiting that is not controlled by your nausea medication, call the clinic.   BELOW ARE SYMPTOMS THAT SHOULD BE REPORTED IMMEDIATELY:  *FEVER GREATER THAN 100.5 F  *CHILLS WITH OR WITHOUT FEVER  NAUSEA AND VOMITING THAT IS NOT CONTROLLED WITH YOUR NAUSEA MEDICATION  *UNUSUAL SHORTNESS OF BREATH  *UNUSUAL BRUISING OR BLEEDING  TENDERNESS IN MOUTH AND THROAT WITH OR WITHOUT PRESENCE OF ULCERS  *URINARY PROBLEMS  *BOWEL PROBLEMS  UNUSUAL RASH Items with * indicate a potential emergency and should be followed up as soon as possible.  Feel free to call the clinic should you have any questions or concerns. The clinic phone number is [phone].  Please show the CHEMO ALERT CARD at check-in to the Emergency Department and triage nurse.   

## 2018-01-01 NOTE — Progress Notes (Signed)
Patient Care Team: [name] as PCP - General (Family Medicine)  DIAGNOSIS:  Encounter Diagnosis  Name Primary?  . Malignant neoplasm of lower-inner quadrant of right breast of female, estrogen receptor positive ([place])     SUMMARY OF ONCOLOGIC HISTORY:   Malignant neoplasm of lower-inner quadrant of right breast of female, estrogen receptor positive ([place])   08/16/2017 Initial Diagnosis    Right lumpectomy: Grade 2 IDC 1.5 cm, with DCIS and necrosis, 0/3 lymph nodes negative, ER 95%, PR 30%, HER-2 negative ratio 1.14, Ki-67 40%, T1CN0 stage Ia; resection of the margin 09/11/2017: Benign    08/16/2017 Oncotype testing    Oncotype DX recurrence score 31: 19% risk of recurrence at 9 years.  High risk    09/25/2017 -  Chemotherapy    Adjuvant chemotherapy with dose dense Adriamycin and Cytoxan x4 followed by Taxol weekly x12      CHIEF COMPLIANT: Cycle 7 Taxol  INTERVAL HISTORY: Jasmine Allen is a 51 year old with above-mentioned history of right breast cancer who is currently on adjuvant chemotherapy today cycle 7 of Taxol.  Has not had any problems with neuropathy.  She does have more fatigue than before.  Denies any nausea vomiting.  She is also complaining of steroid-induced acne and bloating sensation of the face.  Denies any fevers or chills.  REVIEW OF SYSTEMS:   Constitutional: Denies fevers, chills or abnormal weight loss Eyes: Denies blurriness of vision Ears, nose, mouth, throat, and face: Denies mucositis or sore throat Respiratory: Denies cough, dyspnea or wheezes Cardiovascular: Denies palpitation, chest discomfort Gastrointestinal:  Denies nausea, heartburn or change in bowel habits Skin: Denies abnormal skin rashes Lymphatics: Denies new lymphadenopathy or easy bruising Neurological:Denies numbness, tingling or new weaknesses Behavioral/Psych: Mood is stable, no new changes  Extremities: No lower extremity edema All other systems were reviewed with the  patient and are negative.  I have reviewed the past medical history, past surgical history, social history and family history with the patient and they are unchanged from previous note.  ALLERGIES:  is allergic to amoxicillin-pot clavulanate; erythromycin; metoclopramide hcl; and sulfonamide derivatives.  MEDICATIONS:  Current Outpatient Medications  Medication Sig Dispense Refill  . ACETAMINOPHEN-BUTALBITAL 50-325 MG TABS Take 1 tablet by mouth daily as needed (for headache).   0  . ALPRAZolam (XANAX) 0.5 MG tablet Take 0.5 mg by mouth at bedtime as needed for anxiety or sleep.     . Biotin 10000 MCG TABS Take 10,000 mcg by mouth daily.     . cloNIDine (CATAPRES) 0.3 MG tablet Take 0.3 mg by mouth at bedtime.     . cyclobenzaprine (FLEXERIL) 10 MG tablet Take 10 mg by mouth every 8 (eight) hours as needed for muscle spasms.  1  . furosemide (LASIX) 20 MG tablet Take 20 mg by mouth daily as needed for fluid.  5  . hydrocortisone valerate cream (WESTCORT) 0.2 % Apply 1 application topically daily as needed (eczema).     . hydrOXYzine (ATARAX/VISTARIL) 10 MG tablet Take 10-30 mg by mouth at bedtime as needed for itching.   0  . hyoscyamine (LEVSIN SL) 0.125 MG SL tablet dissolve 1 tablet under the tongue every 4 hours if needed (Patient taking differently: dissolve 1 tablet under the tongue every 4 hours if needed for stomach) 30 tablet 0  . ibuprofen (ADVIL,MOTRIN) 200 MG tablet Take 400-800 mg by mouth every 6 (six) hours as needed for fever, headache or moderate pain.    Marland Kitchen LORazepam (ATIVAN)  0.5 MG tablet TAKE 1 TABLET(0.5 MG) BY MOUTH AT BEDTIME AS NEEDED FOR NAUSEA OR VOMITING 30 tablet 1  . Melatonin 5 MG CAPS Take 5 mg by mouth at bedtime as needed (for sleep).     . Multiple Vitamin (MULITIVITAMIN WITH MINERALS) TABS Take 1 tablet by mouth daily.    . ondansetron (ZOFRAN) 8 MG tablet Take 1 tablet (8 mg total) by mouth 2 (two) times daily as needed. Start on the third day after  chemotherapy. 30 tablet 1  . oxyCODONE-acetaminophen (PERCOCET/ROXICET) 5-325 MG tablet Take 2 tablets by mouth every 8 (eight) hours as needed for severe pain. 10 tablet 0  . pantoprazole (PROTONIX) 40 MG tablet Take 1 tablet (40 mg total) by mouth 2 (two) times daily before a meal. (Patient taking differently: Take 40 mg by mouth daily. ) 60 tablet 6  . prochlorperazine (COMPAZINE) 10 MG tablet Take 1 tablet (10 mg total) by mouth every 6 (six) hours as needed (Nausea or vomiting). 30 tablet 1  . spironolactone (ALDACTONE) 25 MG tablet Take 25 mg by mouth daily.    . traZODone (DESYREL) 100 MG tablet Take 100 mg by mouth at bedtime.     . valACYclovir (VALTREX) 1000 MG tablet Take 1,000 mg by mouth 2 (two) times daily as needed (for out breaks).      No current facility-administered medications for this visit.     PHYSICAL EXAMINATION: ECOG PERFORMANCE STATUS: 1 - Symptomatic but completely ambulatory  Vitals:   01/01/18 1111  BP: 134/82  Pulse: 97  Resp: 18  Temp: (!) 97.5 F (36.4 C)  SpO2: 100%   Filed Weights   01/01/18 1111  Weight: 121 lb 14.2 oz (55.3 kg)    GENERAL:alert, no distress and comfortable SKIN: skin color, texture, turgor are normal, no rashes or significant lesions EYES: normal, Conjunctiva are pink and non-injected, sclera clear OROPHARYNX:no exudate, no erythema and lips, buccal mucosa, and tongue normal  NECK: supple, thyroid normal size, non-tender, without nodularity LYMPH:  no palpable lymphadenopathy in the cervical, axillary or inguinal LUNGS: clear to auscultation and percussion with normal breathing effort HEART: regular rate & rhythm and no murmurs and no lower extremity edema ABDOMEN:abdomen soft, non-tender and normal bowel sounds MUSCULOSKELETAL:no cyanosis of digits and no clubbing  NEURO: alert & oriented x 3 with fluent speech, no focal motor/sensory deficits EXTREMITIES: No lower extremity edema   LABORATORY DATA:  I have reviewed the  data as listed CMP Latest Ref Rng & Units 12/25/2017 12/18/2017 12/11/2017  Glucose 70 - 99 mg/dL 85 113(H) 122(H)  BUN 6 - 20 mg/dL 8 12 12   Creatinine 0.44 - 1.00 mg/dL 0.64 0.68 0.65  Sodium 135 - 145 mmol/L 140 137 140  Potassium 3.5 - 5.1 mmol/L 4.4 4.4 4.0  Chloride 98 - 111 mmol/L 107 105 106  CO2 22 - 32 mmol/L 27 24 26   Calcium 8.9 - 10.3 mg/dL 9.3 9.2 8.9  Total Protein 6.5 - 8.1 g/dL 6.4(L) 6.4(L) 6.0(L)  Total Bilirubin 0.3 - 1.2 mg/dL 0.3 0.3 <0.2(L)  Alkaline Phos 38 - 126 U/L 60 62 53  AST 15 - 41 U/L 20 19 21   ALT 0 - 44 U/L 16 18 22     Lab Results  Component Value Date   WBC 5.2 01/01/2018   HGB 12.0 01/01/2018   HCT 34.9 01/01/2018   MCV 98.0 01/01/2018   PLT 209 01/01/2018   NEUTROABS 3.6 01/01/2018    ASSESSMENT & PLAN:  Malignant  neoplasm of lower-inner quadrant of right breast of female, estrogen receptor positive ([place]) 08/16/2017: Right lumpectomy: Grade 2 IDC 1.5 cm, with DCIS and necrosis, 0/3 lymph nodes negative, ER 95%, PR 30%, HER-2 negative ratio 1.14, Ki-67 40%, T1CN0 stage Ia Resection of the margins 09/11/2017: Benign  Treatment plan: 1.Systemic chemotherapy with dose dense Adriamycin and Cytoxan x4 followed by Taxol weekly x12 2. adjuvant radiation therapy 3 followed by adjuvant antiestrogen therapy. -------------------------------------------------------------------------------- Current treatment:Completed 4 cycles ofdose dense Adriamycin and Cytoxan, today is cycle7Taxol  Taxoltoxicities: Fatigue due to chemotherapy: It is getting slightly more pronounced as more treatments gone. Chemotherapy-induced anemia: Hemoglobin 12  Monitoring closely for chemo toxicities Return to clinic weekly for chemo and every other week for follow-up with me  No orders of the defined types were placed in this encounter.  The patient has a good understanding of the overall plan. she agrees with it. she will call with any problems that may develop  before the next visit here.   Harriette Ohara, MD 01/01/18

## 2018-01-08 ENCOUNTER — Inpatient Hospital Stay: Payer: 59

## 2018-01-08 ENCOUNTER — Inpatient Hospital Stay: Payer: 59 | Attending: Hematology and Oncology

## 2018-01-08 ENCOUNTER — Other Ambulatory Visit: Payer: 59

## 2018-01-08 ENCOUNTER — Ambulatory Visit: Payer: 59

## 2018-01-08 ENCOUNTER — Ambulatory Visit: Payer: 59 | Admitting: Hematology and Oncology

## 2018-01-08 VITALS — BP 151/89 | HR 92 | Temp 98.6°F | Resp 18

## 2018-01-08 DIAGNOSIS — R530 Neoplastic (malignant) related fatigue: Secondary | ICD-10-CM | POA: Insufficient documentation

## 2018-01-08 DIAGNOSIS — C50311 Malignant neoplasm of lower-inner quadrant of right female breast: Secondary | ICD-10-CM | POA: Diagnosis present

## 2018-01-08 DIAGNOSIS — Z95828 Presence of other vascular implants and grafts: Secondary | ICD-10-CM

## 2018-01-08 DIAGNOSIS — Z5111 Encounter for antineoplastic chemotherapy: Secondary | ICD-10-CM | POA: Insufficient documentation

## 2018-01-08 DIAGNOSIS — Z170 Estrogen receptor positive status [ER+]: Secondary | ICD-10-CM | POA: Diagnosis not present

## 2018-01-08 DIAGNOSIS — Z79899 Other long term (current) drug therapy: Secondary | ICD-10-CM | POA: Diagnosis not present

## 2018-01-08 DIAGNOSIS — G629 Polyneuropathy, unspecified: Secondary | ICD-10-CM | POA: Insufficient documentation

## 2018-01-08 LAB — CMP (CANCER CENTER ONLY)
ALT: 19 U/L (ref 0–44)
AST: 21 U/L (ref 15–41)
Albumin: 3.9 g/dL (ref 3.5–5.0)
Alkaline Phosphatase: 53 U/L (ref 38–126)
Anion gap: 8 (ref 5–15)
BUN: 10 mg/dL (ref 6–20)
CO2: 26 mmol/L (ref 22–32)
Calcium: 9.4 mg/dL (ref 8.9–10.3)
Chloride: 106 mmol/L (ref 98–111)
Creatinine: 0.63 mg/dL (ref 0.44–1.00)
GFR, Est AFR Am: >60 mL/min (ref >60)
GFR, Estimated: >60 mL/min (ref >60)
Glucose, Bld: 106 mg/dL — ABNORMAL HIGH (ref 70–99)
Potassium: 4.2 mmol/L (ref 3.5–5.1)
Sodium: 140 mmol/L (ref 135–145)
Total Bilirubin: 0.3 mg/dL (ref 0.3–1.2)
Total Protein: 6.4 g/dL — ABNORMAL LOW (ref 6.5–8.1)

## 2018-01-08 LAB — CBC WITH DIFFERENTIAL (CANCER CENTER ONLY)
Basophils Absolute: 0 10*3/uL (ref 0.0–0.1)
Basophils Relative: 1 %
Eosinophils Absolute: 0 10*3/uL (ref 0.0–0.5)
Eosinophils Relative: 0 %
HCT: 35 % (ref 34.8–46.6)
Hemoglobin: 12 g/dL (ref 11.6–15.9)
Lymphocytes Relative: 14 %
Lymphs Abs: 0.8 10*3/uL — ABNORMAL LOW (ref 0.9–3.3)
MCH: 34 pg (ref 25.1–34.0)
MCHC: 34.4 g/dL (ref 31.5–36.0)
MCV: 98.9 fL (ref 79.5–101.0)
Monocytes Absolute: 0.4 10*3/uL (ref 0.1–0.9)
Monocytes Relative: 7 %
Neutro Abs: 4.4 10*3/uL (ref 1.5–6.5)
Neutrophils Relative %: 78 %
Platelet Count: 248 10*3/uL (ref 145–400)
RBC: 3.54 MIL/uL — ABNORMAL LOW (ref 3.70–5.45)
RDW: 15.3 % — ABNORMAL HIGH (ref 11.2–14.5)
WBC Count: 5.7 10*3/uL (ref 3.9–10.3)

## 2018-01-08 MED ORDER — HEPARIN SOD (PORK) LOCK FLUSH 100 UNIT/ML IV SOLN
500.0000 [IU] | Freq: Once | INTRAVENOUS | Status: AC | PRN
  Administered 2018-01-08: 500 [IU]
  Filled 2018-01-08: qty 5

## 2018-01-08 MED ORDER — DIPHENHYDRAMINE HCL 50 MG/ML IJ SOLN
INTRAMUSCULAR | Status: AC
  Filled 2018-01-08: qty 1

## 2018-01-08 MED ORDER — FAMOTIDINE IN NACL 20-0.9 MG/50ML-% IV SOLN
20.0000 mg | Freq: Once | INTRAVENOUS | Status: AC
  Administered 2018-01-08: 20 mg via INTRAVENOUS

## 2018-01-08 MED ORDER — SODIUM CHLORIDE 0.9 % IV SOLN
Freq: Once | INTRAVENOUS | Status: AC
  Administered 2018-01-08: 14:00:00 via INTRAVENOUS
  Filled 2018-01-08: qty 250

## 2018-01-08 MED ORDER — SODIUM CHLORIDE 0.9% FLUSH
10.0000 mL | INTRAVENOUS | Status: DC | PRN
  Administered 2018-01-08: 10 mL
  Filled 2018-01-08: qty 10

## 2018-01-08 MED ORDER — SODIUM CHLORIDE 0.9 % IV SOLN
80.0000 mg/m2 | Freq: Once | INTRAVENOUS | Status: AC
  Administered 2018-01-08: 120 mg via INTRAVENOUS
  Filled 2018-01-08: qty 20

## 2018-01-08 MED ORDER — DIPHENHYDRAMINE HCL 50 MG/ML IJ SOLN
50.0000 mg | Freq: Once | INTRAMUSCULAR | Status: AC
  Administered 2018-01-08: 50 mg via INTRAVENOUS

## 2018-01-08 MED ORDER — FAMOTIDINE IN NACL 20-0.9 MG/50ML-% IV SOLN
INTRAVENOUS | Status: AC
  Filled 2018-01-08: qty 50

## 2018-01-08 NOTE — Patient Instructions (Signed)
[facility] Discharge Instructions for Patients Receiving Chemotherapy  Today you received the following chemotherapy agents paclitaxel (Taxol) To help prevent nausea and vomiting after your treatment, we encourage you to take your nausea medication as directed   If you develop nausea and vomiting that is not controlled by your nausea medication, call the clinic.   BELOW ARE SYMPTOMS THAT SHOULD BE REPORTED IMMEDIATELY:  *FEVER GREATER THAN 100.5 F  *CHILLS WITH OR WITHOUT FEVER  NAUSEA AND VOMITING THAT IS NOT CONTROLLED WITH YOUR NAUSEA MEDICATION  *UNUSUAL SHORTNESS OF BREATH  *UNUSUAL BRUISING OR BLEEDING  TENDERNESS IN MOUTH AND THROAT WITH OR WITHOUT PRESENCE OF ULCERS  *URINARY PROBLEMS  *BOWEL PROBLEMS  UNUSUAL RASH Items with * indicate a potential emergency and should be followed up as soon as possible.  Feel free to call the clinic should you have any questions or concerns. The clinic phone number is [phone].  Please show the CHEMO ALERT CARD at check-in to the Emergency Department and triage nurse.   

## 2018-01-09 ENCOUNTER — Telehealth: Payer: Self-pay | Admitting: *Deleted

## 2018-01-09 ENCOUNTER — Telehealth: Payer: Self-pay

## 2018-01-09 MED ORDER — LEVOFLOXACIN 500 MG PO TABS: 500.0000 mg | ORAL_TABLET | Freq: Every day | ORAL | 0 refills | Status: DC

## 2018-01-09 NOTE — Telephone Encounter (Signed)
Spoke with Sandi Mealy, PA regarding previous message with sinus symptoms.  Verbal order given from Sandi Mealy, PA for Levaquin 500mg  1 tablet PO daily for 10 days.  Patient notified and voiced understanding.  No further needs at this time.

## 2018-01-09 NOTE — Telephone Encounter (Signed)
"  Jasmine Allen [phone]) calling about sinuses and medications to use.   This morning symptoms are worse.    1. Will Dr. Lindi Adie order Levaquin if I'm able to take this with chemotherapy?  Send order to [facility] in [place]. 2. Am I able to take Advil cold and sinus to help with inflammation and pain?   No fever this morning.  History of low grade Temp, 99 in the evenings when this happens.    Swelling and facial pain continue with frontal right maxillary sinus pain and pressure.  Frontal headache when I woke up this morning.  Drainage in back of my throat.    Ears are popping.    Cannot let this go or I'll end up with dizziness and sinus infection. PCP previously orders Levaquin because Z-pack does not work.  No further symptoms at this time."  Routing to provider for review.

## 2018-01-15 ENCOUNTER — Other Ambulatory Visit: Payer: 59

## 2018-01-15 ENCOUNTER — Inpatient Hospital Stay: Payer: 59

## 2018-01-15 ENCOUNTER — Ambulatory Visit: Payer: 59

## 2018-01-15 ENCOUNTER — Inpatient Hospital Stay (HOSPITAL_BASED_OUTPATIENT_CLINIC_OR_DEPARTMENT_OTHER): Payer: 59 | Admitting: Hematology and Oncology

## 2018-01-15 ENCOUNTER — Encounter: Payer: Self-pay | Admitting: *Deleted

## 2018-01-15 DIAGNOSIS — G629 Polyneuropathy, unspecified: Secondary | ICD-10-CM

## 2018-01-15 DIAGNOSIS — C50311 Malignant neoplasm of lower-inner quadrant of right female breast: Secondary | ICD-10-CM | POA: Diagnosis not present

## 2018-01-15 DIAGNOSIS — Z170 Estrogen receptor positive status [ER+]: Secondary | ICD-10-CM

## 2018-01-15 DIAGNOSIS — Z95828 Presence of other vascular implants and grafts: Secondary | ICD-10-CM

## 2018-01-15 DIAGNOSIS — Z5111 Encounter for antineoplastic chemotherapy: Secondary | ICD-10-CM | POA: Diagnosis not present

## 2018-01-15 DIAGNOSIS — R530 Neoplastic (malignant) related fatigue: Secondary | ICD-10-CM

## 2018-01-15 LAB — CBC WITH DIFFERENTIAL (CANCER CENTER ONLY)
Abs Immature Granulocytes: 0.01 10*3/uL (ref 0.00–0.07)
Basophils Absolute: 0 10*3/uL (ref 0.0–0.1)
Basophils Relative: 0 %
Eosinophils Absolute: 0 10*3/uL (ref 0.0–0.5)
Eosinophils Relative: 0 %
HCT: 34 % — ABNORMAL LOW (ref 36.0–46.0)
Hemoglobin: 11.9 g/dL — ABNORMAL LOW (ref 12.0–15.0)
Immature Granulocytes: 0 %
Lymphocytes Relative: 22 %
Lymphs Abs: 1.1 10*3/uL (ref 0.7–4.0)
MCH: 34 pg (ref 26.0–34.0)
MCHC: 35 g/dL (ref 30.0–36.0)
MCV: 97.1 fL (ref 80.0–100.0)
Monocytes Absolute: 0.5 10*3/uL (ref 0.1–1.0)
Monocytes Relative: 10 %
Neutro Abs: 3.2 10*3/uL (ref 1.7–7.7)
Neutrophils Relative %: 68 %
Platelet Count: 198 10*3/uL (ref 150–400)
RBC: 3.5 MIL/uL — ABNORMAL LOW (ref 3.87–5.11)
RDW: 13.3 % (ref 11.5–15.5)
WBC Count: 4.8 10*3/uL (ref 4.0–10.5)
nRBC: 0 % (ref 0.0–0.2)

## 2018-01-15 LAB — CMP (CANCER CENTER ONLY)
ALT: 12 U/L (ref 0–44)
AST: 20 U/L (ref 15–41)
Albumin: 3.9 g/dL (ref 3.5–5.0)
Alkaline Phosphatase: 53 U/L (ref 38–126)
Anion gap: 8 (ref 5–15)
BUN: 12 mg/dL (ref 6–20)
CO2: 26 mmol/L (ref 22–32)
Calcium: 9.2 mg/dL (ref 8.9–10.3)
Chloride: 105 mmol/L (ref 98–111)
Creatinine: 0.66 mg/dL (ref 0.44–1.00)
GFR, Est AFR Am: >60 mL/min (ref >60)
GFR, Estimated: >60 mL/min (ref >60)
Glucose, Bld: 84 mg/dL (ref 70–99)
Potassium: 4.1 mmol/L (ref 3.5–5.1)
Sodium: 139 mmol/L (ref 135–145)
Total Bilirubin: 0.3 mg/dL (ref 0.3–1.2)
Total Protein: 6.4 g/dL — ABNORMAL LOW (ref 6.5–8.1)

## 2018-01-15 MED ORDER — DIPHENHYDRAMINE HCL 50 MG/ML IJ SOLN
50.0000 mg | Freq: Once | INTRAMUSCULAR | Status: AC
  Administered 2018-01-15: 50 mg via INTRAVENOUS

## 2018-01-15 MED ORDER — FAMOTIDINE IN NACL 20-0.9 MG/50ML-% IV SOLN
INTRAVENOUS | Status: AC
  Filled 2018-01-15: qty 50

## 2018-01-15 MED ORDER — SODIUM CHLORIDE 0.9 % IV SOLN
Freq: Once | INTRAVENOUS | Status: AC
  Administered 2018-01-15: 12:00:00 via INTRAVENOUS
  Filled 2018-01-15: qty 250

## 2018-01-15 MED ORDER — SODIUM CHLORIDE 0.9% FLUSH
10.0000 mL | INTRAVENOUS | Status: DC | PRN
  Administered 2018-01-15: 10 mL
  Filled 2018-01-15: qty 10

## 2018-01-15 MED ORDER — HEPARIN SOD (PORK) LOCK FLUSH 100 UNIT/ML IV SOLN
500.0000 [IU] | Freq: Once | INTRAVENOUS | Status: AC | PRN
  Administered 2018-01-15: 500 [IU]
  Filled 2018-01-15: qty 5

## 2018-01-15 MED ORDER — FAMOTIDINE IN NACL 20-0.9 MG/50ML-% IV SOLN
20.0000 mg | Freq: Once | INTRAVENOUS | Status: AC
  Administered 2018-01-15: 20 mg via INTRAVENOUS

## 2018-01-15 MED ORDER — DIPHENHYDRAMINE HCL 50 MG/ML IJ SOLN
INTRAMUSCULAR | Status: AC
  Filled 2018-01-15: qty 1

## 2018-01-15 MED ORDER — HEPARIN SOD (PORK) LOCK FLUSH 100 UNIT/ML IV SOLN
500.0000 [IU] | Freq: Once | INTRAVENOUS | Status: DC | PRN
  Filled 2018-01-15: qty 5

## 2018-01-15 MED ORDER — SODIUM CHLORIDE 0.9 % IV SOLN
80.0000 mg/m2 | Freq: Once | INTRAVENOUS | Status: AC
  Administered 2018-01-15: 120 mg via INTRAVENOUS
  Filled 2018-01-15: qty 20

## 2018-01-15 NOTE — Assessment & Plan Note (Signed)
08/16/2017: Right lumpectomy: Grade 2 IDC 1.5 cm, with DCIS and necrosis, 0/3 lymph nodes negative, ER 95%, PR 30%, HER-2 negative ratio 1.14, Ki-67 40%, T1CN0 stage Ia Resection of the margins 09/11/2017: Benign  Treatment plan: 1.Systemic chemotherapy with dose dense Adriamycin and Cytoxan x4 followed by Taxol weekly x12 2. adjuvant radiation therapy 3 followed by adjuvant antiestrogen therapy. -------------------------------------------------------------------------------- Current treatment:Completed 4 cycles ofdose dense Adriamycin and Cytoxan, today is cycle9Taxol  Taxoltoxicities: Fatigue due to chemotherapy: It is getting slightly more pronounced as more treatments gone. Chemotherapy-induced anemia: Hemoglobin 12  Monitoring closely for chemo toxicities Return to clinic weekly for chemo and every other week for follow-up with me

## 2018-01-15 NOTE — Progress Notes (Signed)
Patient Care Team: [name] as PCP - General (Family Medicine)  DIAGNOSIS:  Encounter Diagnosis  Name Primary?  . Malignant neoplasm of lower-inner quadrant of right breast of female, estrogen receptor positive ([place])     SUMMARY OF ONCOLOGIC HISTORY:   Malignant neoplasm of lower-inner quadrant of right breast of female, estrogen receptor positive ([place])   08/16/2017 Initial Diagnosis    Right lumpectomy: Grade 2 IDC 1.5 cm, with DCIS and necrosis, 0/3 lymph nodes negative, ER 95%, PR 30%, HER-2 negative ratio 1.14, Ki-67 40%, T1CN0 stage Ia; resection of the margin 09/11/2017: Benign    08/16/2017 Oncotype testing    Oncotype DX recurrence score 31: 19% risk of recurrence at 9 years.  High risk    09/25/2017 -  Chemotherapy    Adjuvant chemotherapy with dose dense Adriamycin and Cytoxan x4 followed by Taxol weekly x12      CHIEF COMPLIANT: Cycle 9 Taxol  INTERVAL HISTORY: Jasmine Allen is a 52 year old with above-mentioned history of right breast cancer treated with lumpectomy and is now on adjuvant chemotherapy and today is cycle 9 of Taxol.  She appears to be tolerating Taxol fairly well apart from severe fatigue.  She gets tired most of the week and today she has been feeling much better.  She has just enough energy to do her activities of daily living.  Does not have any nausea or vomiting.  She has noticed a couple of her toes on the left leg are numb.  States this is mostly when it is exposed to cold and mostly of the tips of the toes.  She denies any neuropathy in hands.  REVIEW OF SYSTEMS:   Constitutional: Severe fatigue Eyes: Denies blurriness of vision Ears, nose, mouth, throat, and face: Denies mucositis or sore throat Respiratory: Denies cough, dyspnea or wheezes Cardiovascular: Denies palpitation, chest discomfort Gastrointestinal:  Denies nausea, heartburn or change in bowel habits Skin: Denies abnormal skin rashes Lymphatics: Denies new  lymphadenopathy or easy bruising Neurological: Mild neuropathy in the toes Behavioral/Psych: Mood is stable, no new changes  Extremities: No lower extremity edema  All other systems were reviewed with the patient and are negative.  I have reviewed the past medical history, past surgical history, social history and family history with the patient and they are unchanged from previous note.  ALLERGIES:  is allergic to amoxicillin-pot clavulanate; erythromycin; metoclopramide hcl; and sulfonamide derivatives.  MEDICATIONS:  Current Outpatient Medications  Medication Sig Dispense Refill  . ACETAMINOPHEN-BUTALBITAL 50-325 MG TABS Take 1 tablet by mouth daily as needed (for headache).   0  . ALPRAZolam (XANAX) 0.5 MG tablet Take 0.5 mg by mouth at bedtime as needed for anxiety or sleep.     . Biotin 10000 MCG TABS Take 10,000 mcg by mouth daily.     . cloNIDine (CATAPRES) 0.3 MG tablet Take 0.3 mg by mouth at bedtime.     . cyclobenzaprine (FLEXERIL) 10 MG tablet Take 10 mg by mouth every 8 (eight) hours as needed for muscle spasms.  1  . furosemide (LASIX) 20 MG tablet Take 20 mg by mouth daily as needed for fluid.  5  . hydrocortisone valerate cream (WESTCORT) 0.2 % Apply 1 application topically daily as needed (eczema).     . hydrOXYzine (ATARAX/VISTARIL) 10 MG tablet Take 10-30 mg by mouth at bedtime as needed for itching.   0  . hyoscyamine (LEVSIN SL) 0.125 MG SL tablet dissolve 1 tablet under the tongue every 4 hours  if needed (Patient taking differently: dissolve 1 tablet under the tongue every 4 hours if needed for stomach) 30 tablet 0  . ibuprofen (ADVIL,MOTRIN) 200 MG tablet Take 400-800 mg by mouth every 6 (six) hours as needed for fever, headache or moderate pain.    Marland Kitchen levofloxacin (LEVAQUIN) 500 MG tablet Take 1 tablet (500 mg total) by mouth daily for 10 days. 10 tablet 0  . LORazepam (ATIVAN) 0.5 MG tablet TAKE 1 TABLET(0.5 MG) BY MOUTH AT BEDTIME AS NEEDED FOR NAUSEA OR VOMITING 30  tablet 1  . Melatonin 5 MG CAPS Take 5 mg by mouth at bedtime as needed (for sleep).     . Multiple Vitamin (MULITIVITAMIN WITH MINERALS) TABS Take 1 tablet by mouth daily.    . ondansetron (ZOFRAN) 8 MG tablet Take 1 tablet (8 mg total) by mouth 2 (two) times daily as needed. Start on the third day after chemotherapy. 30 tablet 1  . oxyCODONE-acetaminophen (PERCOCET/ROXICET) 5-325 MG tablet Take 2 tablets by mouth every 8 (eight) hours as needed for severe pain. 10 tablet 0  . pantoprazole (PROTONIX) 40 MG tablet Take 1 tablet (40 mg total) by mouth 2 (two) times daily before a meal. (Patient taking differently: Take 40 mg by mouth daily. ) 60 tablet 6  . prochlorperazine (COMPAZINE) 10 MG tablet Take 1 tablet (10 mg total) by mouth every 6 (six) hours as needed (Nausea or vomiting). 30 tablet 1  . spironolactone (ALDACTONE) 25 MG tablet Take 25 mg by mouth daily.    . traZODone (DESYREL) 100 MG tablet Take 100 mg by mouth at bedtime.     . valACYclovir (VALTREX) 1000 MG tablet Take 1,000 mg by mouth 2 (two) times daily as needed (for out breaks).      No current facility-administered medications for this visit.     PHYSICAL EXAMINATION: ECOG PERFORMANCE STATUS: 1 - Symptomatic but completely ambulatory  Vitals:   01/15/18 1139  BP: 114/89  Pulse: 91  Resp: 17  Temp: 98 F (36.7 C)  SpO2: 100%   Filed Weights   01/15/18 1139  Weight: 123 lb 8 oz (56 kg)    GENERAL:alert, no distress and comfortable SKIN: skin color, texture, turgor are normal, no rashes or significant lesions EYES: normal, Conjunctiva are pink and non-injected, sclera clear OROPHARYNX:no exudate, no erythema and lips, buccal mucosa, and tongue normal  NECK: supple, thyroid normal size, non-tender, without nodularity LYMPH:  no palpable lymphadenopathy in the cervical, axillary or inguinal LUNGS: clear to auscultation and percussion with normal breathing effort HEART: regular rate & rhythm and no murmurs and no  lower extremity edema ABDOMEN:abdomen soft, non-tender and normal bowel sounds MUSCULOSKELETAL:no cyanosis of digits and no clubbing  NEURO: alert & oriented x 3 with fluent speech, no focal motor/sensory deficits EXTREMITIES: No lower extremity edema    LABORATORY DATA:  I have reviewed the data as listed CMP Latest Ref Rng & Units 01/08/2018 01/01/2018 12/25/2017  Glucose 70 - 99 mg/dL 106(H) 111(H) 85  BUN 6 - 20 mg/dL _0 Creatinine 0.44 - 1.00 mg/dL 0.63 0.70 0.64  Sodium 135 - 145 mmol/L 140 138 140  Potassium 3.5 - 5.1 mmol/L 4.2 4.3 4.4  Chloride 98 - 111 mmol/L 106 104 107  CO2 22 - 32 mmol/L _1 Calcium 8.9 - 10.3 mg/dL 9.4 9.4 9.3  Total Protein 6.5 - 8.1 g/dL 6.4(L) 6.5 6.4(L)  Total Bilirubin 0.3 - 1.2 mg/dL 0.3 0.2(L) 0.3  Alkaline  Phos 38 - 126 U/L 53 64 60  AST 15 - 41 U/L _0 ALT 0 - 44 U/L _1 Lab Results  Component Value Date   WBC 4.8 01/15/2018   HGB 11.9 (L) 01/15/2018   HCT 34.0 (L) 01/15/2018   MCV 97.1 01/15/2018   PLT 198 01/15/2018   NEUTROABS 3.2 01/15/2018    ASSESSMENT & PLAN:  Malignant neoplasm of lower-inner quadrant of right breast of female, estrogen receptor positive ([place]) 08/16/2017: Right lumpectomy: Grade 2 IDC 1.5 cm, with DCIS and necrosis, 0/3 lymph nodes negative, ER 95%, PR 30%, HER-2 negative ratio 1.14, Ki-67 40%, T1CN0 stage Ia Resection of the margins 09/11/2017: Benign  Treatment plan: 1.Systemic chemotherapy with dose dense Adriamycin and Cytoxan x4 followed by Taxol weekly x12 2. adjuvant radiation therapy 3 followed by adjuvant antiestrogen therapy. -------------------------------------------------------------------------------- Current treatment:Completed 4 cycles ofdose dense Adriamycin and Cytoxan, today is cycle9Taxol  Taxoltoxicities: Fatigue due to chemotherapy: It is getting slightly more pronounced as more treatments gone. Neuropathy grade 1: Monitoring at this time.  If next  week her neuropathy gets worse then we may have to reduce the dosage of Taxol.  Monitoring closely for chemo toxicities Return to clinic weekly for chemo and every other week for follow-up with me   No orders of the defined types were placed in this encounter.  The patient has a good understanding of the overall plan. she agrees with it. she will call with any problems that may develop before the next visit here.   Harriette Ohara, MD 01/15/18

## 2018-01-15 NOTE — Patient Instructions (Signed)
[facility] Discharge Instructions for Patients Receiving Chemotherapy  Today you received the following chemotherapy agents:  Taxol.  To help prevent nausea and vomiting after your treatment, we encourage you to take your nausea medication as directed.   If you develop nausea and vomiting that is not controlled by your nausea medication, call the clinic.   BELOW ARE SYMPTOMS THAT SHOULD BE REPORTED IMMEDIATELY:  *FEVER GREATER THAN 100.5 F  *CHILLS WITH OR WITHOUT FEVER  NAUSEA AND VOMITING THAT IS NOT CONTROLLED WITH YOUR NAUSEA MEDICATION  *UNUSUAL SHORTNESS OF BREATH  *UNUSUAL BRUISING OR BLEEDING  TENDERNESS IN MOUTH AND THROAT WITH OR WITHOUT PRESENCE OF ULCERS  *URINARY PROBLEMS  *BOWEL PROBLEMS  UNUSUAL RASH Items with * indicate a potential emergency and should be followed up as soon as possible.  Feel free to call the clinic should you have any questions or concerns. The clinic phone number is [phone].  Please show the CHEMO ALERT CARD at check-in to the Emergency Department and triage nurse.   

## 2018-01-17 ENCOUNTER — Other Ambulatory Visit: Payer: Self-pay | Admitting: Hematology and Oncology

## 2018-01-17 DIAGNOSIS — Z170 Estrogen receptor positive status [ER+]: Principal | ICD-10-CM

## 2018-01-17 DIAGNOSIS — C50311 Malignant neoplasm of lower-inner quadrant of right female breast: Secondary | ICD-10-CM

## 2018-01-19 ENCOUNTER — Emergency Department (HOSPITAL_BASED_OUTPATIENT_CLINIC_OR_DEPARTMENT_OTHER): Payer: 59

## 2018-01-19 ENCOUNTER — Emergency Department (HOSPITAL_COMMUNITY)
Admission: EM | Admit: 2018-01-19 | Discharge: 2018-01-19 | Disposition: A | Discharge status: Home / Self Care | Payer: 59 | Attending: Emergency Medicine | Admitting: Emergency Medicine

## 2018-01-19 ENCOUNTER — Other Ambulatory Visit: Payer: Self-pay

## 2018-01-19 ENCOUNTER — Encounter (HOSPITAL_COMMUNITY): Payer: Self-pay

## 2018-01-19 DIAGNOSIS — R1032 Left lower quadrant pain: Secondary | ICD-10-CM | POA: Insufficient documentation

## 2018-01-19 DIAGNOSIS — Z170 Estrogen receptor positive status [ER+]: Secondary | ICD-10-CM | POA: Diagnosis not present

## 2018-01-19 DIAGNOSIS — Z87891 Personal history of nicotine dependence: Secondary | ICD-10-CM | POA: Insufficient documentation

## 2018-01-19 DIAGNOSIS — M7989 Other specified soft tissue disorders: Secondary | ICD-10-CM

## 2018-01-19 DIAGNOSIS — C50311 Malignant neoplasm of lower-inner quadrant of right female breast: Secondary | ICD-10-CM | POA: Insufficient documentation

## 2018-01-19 DIAGNOSIS — Z79899 Other long term (current) drug therapy: Secondary | ICD-10-CM | POA: Diagnosis not present

## 2018-01-19 DIAGNOSIS — R600 Localized edema: Secondary | ICD-10-CM | POA: Diagnosis not present

## 2018-01-19 DIAGNOSIS — M79662 Pain in left lower leg: Secondary | ICD-10-CM | POA: Insufficient documentation

## 2018-01-19 DIAGNOSIS — R52 Pain, unspecified: Secondary | ICD-10-CM | POA: Diagnosis not present

## 2018-01-19 NOTE — ED Triage Notes (Signed)
Pt stated she first noticed swelling in her left foot yesterday. When her husband massaged her left foot she noticed that there was pain, and pt felt as if the area was bruised. Pt is currently on chemo for breast cancer, her last treatment was Tuesday. Pt is also currently on 25 mg spirolactone last taken Wednesday morning, 20 mg of lasix last taken this morning. Pt came in worried due to the unilateral swelling and feelings of chills. 10

## 2018-01-19 NOTE — Progress Notes (Signed)
VASCULAR LAB PRELIMINARY  PRELIMINARY  PRELIMINARY  PRELIMINARY  Left lower extremity venous duplex completed.    Preliminary report:  There is no obvious evidence of DVT noted in the left lower extremity.  The greater saphenous vein is thickened, but there is no acute superficial thrombosis noted.  Biruk Troia, RVT 01/19/2018, 7:34 PM

## 2018-01-19 NOTE — ED Notes (Signed)
Vas tech at bedside

## 2018-01-19 NOTE — ED Provider Notes (Signed)
[facility] DEPT Provider Note   CSN: [id number] Arrival date & time: 01/19/18  1640   History   Chief Complaint Chief Complaint  Patient presents with  . Foot Swelling    Left foot and ankle  . Foot Pain    Left foot    HPI Jasmine Allen is a 52 y.o. female with past medical history significant for chronic anemia, active breast cancer, tobacco use who presents for evaluation of left lower extremity swelling.  Per patient she noticed the distal portion of her left calf and her foot was swollen yesterday.  States her husband was massaging her leg yesterday and she noticed that she had tenderness to the back of her left lower quadrant.  States she does have a history of paresthesias in her lower extremities secondary to her Taxol treatment for breast cancer.  Admits to chills, however she states she has had this since her chemotherapy treatments started a few months ago.  Denies fever, chest pain, shortness of breath, cough, joint pain, nausea, vomiting, diarrhea, hemoptysis. No aggravating or alleviating factors  HPI  Past Medical History:  Diagnosis Date  . Alopecia, unspecified   . Anemia, unspecified    during her pregnancy  . Anxiety state, unspecified   . Bulging disc   . Depressive disorder, not elsewhere classified   . Diverticulosis of colon (without mention of hemorrhage)    CT Scan  . Dysrhythmia    PVCs in the past  . Esophageal reflux   . Esophagitis 1998  . Hiatal hernia 1998  . History of kidney stones   . Hypercholesteremia   . Hypertension   . Migraine   . Opioid abuse, in remission [facility])     Patient Active Problem List   Diagnosis Date Noted  . Port-A-Cath in place 10/23/2017  . Malignant neoplasm of lower-inner quadrant of right breast of female, estrogen receptor positive ([place]) 08/23/2017  . Chest pain 04/07/2014  . Smoking 04/07/2014  . ANEMIA, IRON DEFICIENCY 10/20/2009  . DEPRESSION 06/15/2009  . GERD  06/15/2009  . IRRITABLE BOWEL SYNDROME 06/15/2009  . RECTAL BLEEDING 06/15/2009    Past Surgical History:  Procedure Laterality Date  . ABLATION    . BREAST LUMPECTOMY WITH AXILLARY LYMPH NODE BIOPSY Right 08/16/2017   Procedure: RIGHT BREAST LUMPECTOMY WITH AXILLARY SENTINEL LYMPH NODE BIOPSY;  Surgeon: Rolm Bookbinder, MD;  Location: [facility];  Service: General;  Laterality: Right;  . CARDIAC CATHETERIZATION  2011   normal coronaries  . KNEE SURGERY    . PORTACATH PLACEMENT Right 09/11/2017   Procedure: INSERTION PORT-A-CATH WITH ULTRASOUND;  Surgeon: Rolm Bookbinder, MD;  Location: [place];  Service: General;  Laterality: Right;  . RE-EXCISION OF BREAST LUMPECTOMY Right 09/11/2017   Procedure: RE-EXCISION OF BREAST LUMPECTOMY;  Surgeon: Rolm Bookbinder, MD;  Location: [facility];  Service: General;  Laterality: Right;  . TONSILLECTOMY       OB History   None      Home Medications    Prior to Admission medications   Medication Sig Start Date End Date Taking? Authorizing Provider  ALPRAZolam Duanne Moron) 0.5 MG tablet Take 0.5 mg by mouth at bedtime as needed for anxiety or sleep.  10/21/13  Yes [provider]  Biotin 10000 MCG TABS Take 10,000 mcg by mouth daily.    Yes [provider]  cloNIDine (CATAPRES) 0.3 MG tablet Take 0.3 mg by mouth at bedtime.    Yes [provider]  cyclobenzaprine (FLEXERIL) 10 MG tablet Take 10 mg by mouth every 8 (eight) hours as needed for muscle spasms. 07/16/17  Yes [provider]  furosemide (LASIX) 20 MG tablet Take 20 mg by mouth daily as needed for fluid. 08/04/17  Yes [provider]  hydrocortisone valerate cream (WESTCORT) 0.2 % Apply 1 application topically daily as needed (eczema).  08/21/12  Yes [provider]  hydrOXYzine (ATARAX/VISTARIL) 10 MG tablet Take 10-30 mg by mouth at bedtime as needed for itching.  10/07/15  Yes [provider]    hyoscyamine (LEVSIN SL) 0.125 MG SL tablet dissolve 1 tablet under the tongue every 4 hours if needed Patient taking differently: Take 0.125 mg by mouth every 4 (four) hours as needed for cramping.  10/02/16  Yes Nandigam, Kavitha V, MD  LORazepam (ATIVAN) 0.5 MG tablet TAKE 1 TABLET(0.5 MG) BY MOUTH AT BEDTIME AS NEEDED FOR NAUSEA OR VOMITING Patient taking differently: Take 0.5 mg by mouth at bedtime.  01/17/18  Yes Nicholas Lose, MD  Melatonin 5 MG CAPS Take 5 mg by mouth at bedtime as needed (for sleep).    Yes [provider]  Multiple Vitamin (MULITIVITAMIN WITH MINERALS) TABS Take 1 tablet by mouth daily.   Yes [provider]  ondansetron (ZOFRAN) 8 MG tablet Take 1 tablet (8 mg total) by mouth 2 (two) times daily as needed. Start on the third day after chemotherapy. 09/06/17  Yes Nicholas Lose, MD  oxyCODONE-acetaminophen (PERCOCET/ROXICET) 5-325 MG tablet Take 2 tablets by mouth every 8 (eight) hours as needed for severe pain. 12/18/17  Yes Nicholas Lose, MD  pantoprazole (PROTONIX) 40 MG tablet Take 1 tablet (40 mg total) by mouth 2 (two) times daily before a meal. Patient taking differently: Take 40 mg by mouth daily.  06/28/16  Yes Nandigam, Venia Minks, MD  prochlorperazine (COMPAZINE) 10 MG tablet Take 1 tablet (10 mg total) by mouth every 6 (six) hours as needed (Nausea or vomiting). 12/18/17  Yes Nicholas Lose, MD  spironolactone (ALDACTONE) 25 MG tablet Take 25 mg by mouth daily.   Yes [provider]  traZODone (DESYREL) 100 MG tablet Take 100 mg by mouth at bedtime.  10/17/12  Yes [provider]  valACYclovir (VALTREX) 1000 MG tablet Take 1,000 mg by mouth 2 (two) times daily as needed (for out breaks).    Yes [provider]    Family History Family History  Problem Relation Age of Onset  . Heart disease Father        CABG  . Breast cancer Mother   . Breast cancer Maternal Grandmother   . Breast cancer Cousin   . Colon cancer Neg Hx    . Stomach cancer Neg Hx     Social History Social History   Tobacco Use  . Smoking status: Former Smoker    Packs/day: 0.25    Last attempt to quit: 08/25/2016    Years since quitting: 1.4  . Smokeless tobacco: Never Used  Substance Use Topics  . Alcohol use: Yes    Comment: "once a week"  . Drug use: No     Allergies   Amoxicillin-pot clavulanate; Erythromycin; Metoclopramide hcl; and Sulfonamide derivatives   Review of Systems Review of Systems  Constitutional: Negative for activity change, appetite change, chills, diaphoresis, fatigue and fever.  Respiratory: Negative for apnea, cough, choking, chest tightness, shortness of breath, wheezing and stridor.   Cardiovascular: Positive for leg swelling. Negative for chest pain and palpitations.  Gastrointestinal: Negative for  abdominal distention, abdominal pain, constipation, diarrhea, nausea and vomiting.  Musculoskeletal: Negative for arthralgias, back pain, gait problem, joint swelling, myalgias, neck pain and neck stiffness.  Skin: Negative.   Neurological: Negative for dizziness, weakness, light-headedness, numbness and headaches.     Physical Exam Updated Vital Signs BP 131/60   Pulse 87   Temp 99.4 F (37.4 C)   Resp 18   Ht 5' (1.524 m)   Wt 54.4 kg   LMP 12/24/2010   SpO2 100%   BMI 23.44 kg/m   Physical Exam  Constitutional: She appears well-developed and well-nourished. No distress.  HENT:  Head: Atraumatic.  Mouth/Throat: Oropharynx is clear and moist.  Eyes: Pupils are equal, round, and reactive to light.  Neck: Normal range of motion.  Cardiovascular: Normal rate, regular rhythm, normal heart sounds and intact distal pulses. Exam reveals no gallop and no friction rub.  No murmur heard. Pulmonary/Chest: Effort normal and breath sounds normal. No stridor. No respiratory distress. She has no wheezes. She has no rales. She exhibits no tenderness.  Abdominal: Soft. Bowel sounds are normal. She  exhibits no distension and no mass. There is no tenderness. There is no rebound and no guarding. No hernia.  Musculoskeletal: Normal range of motion.  Scant bilateral pitting edema.  Tenderness to palpation over left posterior calf.  Neurological: She is alert.  Skin: Skin is warm and dry.  No erythema, warmth to bilateral lower extremities.  No rashes or lesions.  Psychiatric: She has a normal mood and affect.  Nursing note and vitals reviewed.    ED Treatments / Results  Labs (all labs ordered are listed, but only abnormal results are displayed) Labs Reviewed - No data to display  EKG None  Radiology No results found.  Procedures Procedures (including critical care time)  Medications Ordered in ED Medications - No data to display   Initial Impression / Assessment and Plan / ED Course  I have reviewed the triage vital signs and the nursing notes.  Pertinent labs & imaging results that were available during my care of the patient were reviewed by me and considered in my medical decision making (see chart for details).  52 year old female who appears otherwise well presents for evaluation of left lower extremity swelling and tenderness x1 day.  Active breast cancer diagnosis and tobacco use.  No recent travel or recent surgery.  Given her active malignancy and tobacco use will obtain ultrasound left lower extremity to r/o DVT.  Low suspicion for cellulitis as no erythema warmth, rashes or lesions to lower extremity.  She does have scant pitting edema, however this is bilateral in nature. Looking at her past medical records she does have a history of bilateral edema and does take Lasix for this. No evidence of unilateral leg swelling. No trauma or injury to left lower extremity.  [place] negative for DVT. No evidence of infection in left lower extremity. Appears stable for dc. Discussed with patient follow-up with her PCP if she continues to have intermittent pain and swelling. Discussed  reasons to return to the ED. Patient and husband voice understanding and are agreeable for follow-up.   Final Clinical Impressions(s) / ED Diagnoses   Final diagnoses:  Pain of left lower leg    ED Discharge Orders    None       Prophet Renwick A, PA-C 01/19/18 1956    Virgel Manifold, MD 01/20/18 1536

## 2018-01-19 NOTE — Discharge Instructions (Addendum)
You were evaluated today for left lower leg swelling.  The ultrasound was negative for a blood clot and you do no have any evidence of infection on your exam. Continue to take your diuretics as previously prescribed.  Please follow-up with your PCP for reevaluation.  Return to the ED with any new or worsening symptoms such as:  Contact a health care provider if: Your pain is getting worse. Your pain is not relieved with medicines. You lose function in the area of the pain if the pain is in your arms, legs, or neck. Your leg becomes numb or cold to the touch Your leg becomes pale in color

## 2018-01-21 ENCOUNTER — Telehealth: Payer: Self-pay

## 2018-01-21 NOTE — Telephone Encounter (Signed)
Left message for patient to return call to follow up on recent ED visit.

## 2018-01-22 ENCOUNTER — Other Ambulatory Visit: Payer: 59

## 2018-01-22 ENCOUNTER — Ambulatory Visit: Payer: 59

## 2018-01-22 ENCOUNTER — Inpatient Hospital Stay: Payer: 59

## 2018-01-22 ENCOUNTER — Ambulatory Visit: Payer: 59 | Admitting: Hematology and Oncology

## 2018-01-22 VITALS — BP 126/88 | HR 98 | Temp 98.2°F | Resp 18 | Ht 60.0 in | Wt 120.0 lb

## 2018-01-22 DIAGNOSIS — Z5111 Encounter for antineoplastic chemotherapy: Secondary | ICD-10-CM | POA: Diagnosis not present

## 2018-01-22 DIAGNOSIS — Z170 Estrogen receptor positive status [ER+]: Principal | ICD-10-CM

## 2018-01-22 DIAGNOSIS — C50311 Malignant neoplasm of lower-inner quadrant of right female breast: Secondary | ICD-10-CM

## 2018-01-22 DIAGNOSIS — Z95828 Presence of other vascular implants and grafts: Secondary | ICD-10-CM

## 2018-01-22 LAB — CMP (CANCER CENTER ONLY)
ALT: 11 U/L (ref 0–44)
AST: 20 U/L (ref 15–41)
Albumin: 3.7 g/dL (ref 3.5–5.0)
Alkaline Phosphatase: 51 U/L (ref 38–126)
Anion gap: 7 (ref 5–15)
BUN: 13 mg/dL (ref 6–20)
CO2: 26 mmol/L (ref 22–32)
Calcium: 9.1 mg/dL (ref 8.9–10.3)
Chloride: 105 mmol/L (ref 98–111)
Creatinine: 0.6 mg/dL (ref 0.44–1.00)
GFR, Est AFR Am: >60 mL/min (ref >60)
GFR, Estimated: >60 mL/min (ref >60)
Glucose, Bld: 87 mg/dL (ref 70–99)
Potassium: 4.4 mmol/L (ref 3.5–5.1)
Sodium: 138 mmol/L (ref 135–145)
Total Bilirubin: 0.2 mg/dL — ABNORMAL LOW (ref 0.3–1.2)
Total Protein: 6.1 g/dL — ABNORMAL LOW (ref 6.5–8.1)

## 2018-01-22 LAB — CBC WITH DIFFERENTIAL (CANCER CENTER ONLY)
Abs Immature Granulocytes: 0.02 10*3/uL (ref 0.00–0.07)
Basophils Absolute: 0 10*3/uL (ref 0.0–0.1)
Basophils Relative: 0 %
Eosinophils Absolute: 0 10*3/uL (ref 0.0–0.5)
Eosinophils Relative: 1 %
HCT: 33.1 % — ABNORMAL LOW (ref 36.0–46.0)
Hemoglobin: 11.2 g/dL — ABNORMAL LOW (ref 12.0–15.0)
Immature Granulocytes: 1 %
Lymphocytes Relative: 22 %
Lymphs Abs: 0.9 10*3/uL (ref 0.7–4.0)
MCH: 33.4 pg (ref 26.0–34.0)
MCHC: 33.8 g/dL (ref 30.0–36.0)
MCV: 98.8 fL (ref 80.0–100.0)
Monocytes Absolute: 0.5 10*3/uL (ref 0.1–1.0)
Monocytes Relative: 12 %
Neutro Abs: 2.6 10*3/uL (ref 1.7–7.7)
Neutrophils Relative %: 64 %
Platelet Count: 204 10*3/uL (ref 150–400)
RBC: 3.35 MIL/uL — ABNORMAL LOW (ref 3.87–5.11)
RDW: 13.2 % (ref 11.5–15.5)
WBC Count: 4 10*3/uL (ref 4.0–10.5)
nRBC: 0 % (ref 0.0–0.2)

## 2018-01-22 MED ORDER — FAMOTIDINE IN NACL 20-0.9 MG/50ML-% IV SOLN
INTRAVENOUS | Status: AC
  Filled 2018-01-22: qty 50

## 2018-01-22 MED ORDER — DIPHENHYDRAMINE HCL 50 MG/ML IJ SOLN
50.0000 mg | Freq: Once | INTRAMUSCULAR | Status: AC
  Administered 2018-01-22: 50 mg via INTRAVENOUS

## 2018-01-22 MED ORDER — HEPARIN SOD (PORK) LOCK FLUSH 100 UNIT/ML IV SOLN
500.0000 [IU] | Freq: Once | INTRAVENOUS | Status: AC | PRN
  Administered 2018-01-22: 500 [IU]
  Filled 2018-01-22: qty 5

## 2018-01-22 MED ORDER — SODIUM CHLORIDE 0.9% FLUSH
10.0000 mL | INTRAVENOUS | Status: DC | PRN
  Administered 2018-01-22: 10 mL
  Filled 2018-01-22: qty 10

## 2018-01-22 MED ORDER — DIPHENHYDRAMINE HCL 50 MG/ML IJ SOLN
INTRAMUSCULAR | Status: AC
  Filled 2018-01-22: qty 1

## 2018-01-22 MED ORDER — SODIUM CHLORIDE 0.9 % IV SOLN
80.0000 mg/m2 | Freq: Once | INTRAVENOUS | Status: AC
  Administered 2018-01-22: 120 mg via INTRAVENOUS
  Filled 2018-01-22: qty 20

## 2018-01-22 MED ORDER — FAMOTIDINE IN NACL 20-0.9 MG/50ML-% IV SOLN
20.0000 mg | Freq: Once | INTRAVENOUS | Status: AC
  Administered 2018-01-22: 20 mg via INTRAVENOUS

## 2018-01-22 MED ORDER — SODIUM CHLORIDE 0.9 % IV SOLN
Freq: Once | INTRAVENOUS | Status: AC
  Administered 2018-01-22: 12:00:00 via INTRAVENOUS
  Filled 2018-01-22: qty 250

## 2018-01-22 NOTE — Patient Instructions (Signed)
[facility] Discharge Instructions for Patients Receiving Chemotherapy  Today you received the following chemotherapy agents: Paclitaxel (Taxol).  To help prevent nausea and vomiting after your treatment, we encourage you to take your nausea medication as directed.    If you develop nausea and vomiting that is not controlled by your nausea medication, call the clinic.   BELOW ARE SYMPTOMS THAT SHOULD BE REPORTED IMMEDIATELY:  *FEVER GREATER THAN 100.5 F  *CHILLS WITH OR WITHOUT FEVER  NAUSEA AND VOMITING THAT IS NOT CONTROLLED WITH YOUR NAUSEA MEDICATION  *UNUSUAL SHORTNESS OF BREATH  *UNUSUAL BRUISING OR BLEEDING  TENDERNESS IN MOUTH AND THROAT WITH OR WITHOUT PRESENCE OF ULCERS  *URINARY PROBLEMS  *BOWEL PROBLEMS  UNUSUAL RASH Items with * indicate a potential emergency and should be followed up as soon as possible.  Feel free to call the clinic should you have any questions or concerns. The clinic phone number is [phone].  Please show the CHEMO ALERT CARD at check-in to the Emergency Department and triage nurse.   

## 2018-01-28 ENCOUNTER — Other Ambulatory Visit: Payer: Self-pay | Admitting: Hematology and Oncology

## 2018-01-28 DIAGNOSIS — Z170 Estrogen receptor positive status [ER+]: Principal | ICD-10-CM

## 2018-01-28 DIAGNOSIS — C50311 Malignant neoplasm of lower-inner quadrant of right female breast: Secondary | ICD-10-CM

## 2018-01-29 ENCOUNTER — Encounter: Payer: Self-pay | Admitting: *Deleted

## 2018-01-29 ENCOUNTER — Inpatient Hospital Stay: Payer: 59

## 2018-01-29 ENCOUNTER — Inpatient Hospital Stay (HOSPITAL_BASED_OUTPATIENT_CLINIC_OR_DEPARTMENT_OTHER): Payer: 59 | Admitting: Hematology and Oncology

## 2018-01-29 VITALS — BP 138/66 | HR 91 | Temp 97.8°F | Resp 17 | Ht 60.0 in | Wt 126.2 lb

## 2018-01-29 DIAGNOSIS — G629 Polyneuropathy, unspecified: Secondary | ICD-10-CM

## 2018-01-29 DIAGNOSIS — Z5111 Encounter for antineoplastic chemotherapy: Secondary | ICD-10-CM

## 2018-01-29 DIAGNOSIS — C50311 Malignant neoplasm of lower-inner quadrant of right female breast: Secondary | ICD-10-CM | POA: Diagnosis not present

## 2018-01-29 DIAGNOSIS — Z170 Estrogen receptor positive status [ER+]: Secondary | ICD-10-CM | POA: Diagnosis not present

## 2018-01-29 DIAGNOSIS — R530 Neoplastic (malignant) related fatigue: Secondary | ICD-10-CM | POA: Diagnosis not present

## 2018-01-29 DIAGNOSIS — Z95828 Presence of other vascular implants and grafts: Secondary | ICD-10-CM

## 2018-01-29 LAB — CBC WITH DIFFERENTIAL (CANCER CENTER ONLY)
Abs Immature Granulocytes: 0.01 10*3/uL (ref 0.00–0.07)
Basophils Absolute: 0 10*3/uL (ref 0.0–0.1)
Basophils Relative: 1 %
Eosinophils Absolute: 0.1 10*3/uL (ref 0.0–0.5)
Eosinophils Relative: 2 %
HCT: 32 % — ABNORMAL LOW (ref 36.0–46.0)
Hemoglobin: 11 g/dL — ABNORMAL LOW (ref 12.0–15.0)
Immature Granulocytes: 0 %
Lymphocytes Relative: 25 %
Lymphs Abs: 0.9 10*3/uL (ref 0.7–4.0)
MCH: 33.6 pg (ref 26.0–34.0)
MCHC: 34.4 g/dL (ref 30.0–36.0)
MCV: 97.9 fL (ref 80.0–100.0)
Monocytes Absolute: 0.4 10*3/uL (ref 0.1–1.0)
Monocytes Relative: 11 %
Neutro Abs: 2.1 10*3/uL (ref 1.7–7.7)
Neutrophils Relative %: 61 %
Platelet Count: 209 10*3/uL (ref 150–400)
RBC: 3.27 MIL/uL — ABNORMAL LOW (ref 3.87–5.11)
RDW: 13 % (ref 11.5–15.5)
WBC Count: 3.4 10*3/uL — ABNORMAL LOW (ref 4.0–10.5)
nRBC: 0 % (ref 0.0–0.2)

## 2018-01-29 LAB — CMP (CANCER CENTER ONLY)
ALT: 13 U/L (ref 0–44)
AST: 19 U/L (ref 15–41)
Albumin: 3.5 g/dL (ref 3.5–5.0)
Alkaline Phosphatase: 49 U/L (ref 38–126)
Anion gap: 7 (ref 5–15)
BUN: 14 mg/dL (ref 6–20)
CO2: 28 mmol/L (ref 22–32)
Calcium: 9 mg/dL (ref 8.9–10.3)
Chloride: 105 mmol/L (ref 98–111)
Creatinine: 0.65 mg/dL (ref 0.44–1.00)
GFR, Est AFR Am: >60 mL/min (ref >60)
GFR, Estimated: >60 mL/min (ref >60)
Glucose, Bld: 99 mg/dL (ref 70–99)
Potassium: 4.1 mmol/L (ref 3.5–5.1)
Sodium: 140 mmol/L (ref 135–145)
Total Bilirubin: <0.2 mg/dL — ABNORMAL LOW (ref 0.3–1.2)
Total Protein: 5.7 g/dL — ABNORMAL LOW (ref 6.5–8.1)

## 2018-01-29 MED ORDER — SODIUM CHLORIDE 0.9 % IV SOLN
Freq: Once | INTRAVENOUS | Status: AC
  Administered 2018-01-29: 12:00:00 via INTRAVENOUS
  Filled 2018-01-29: qty 250

## 2018-01-29 MED ORDER — SODIUM CHLORIDE 0.9 % IV SOLN
80.0000 mg/m2 | Freq: Once | INTRAVENOUS | Status: AC
  Administered 2018-01-29: 120 mg via INTRAVENOUS
  Filled 2018-01-29: qty 20

## 2018-01-29 MED ORDER — DIPHENHYDRAMINE HCL 50 MG/ML IJ SOLN
INTRAMUSCULAR | Status: AC
  Filled 2018-01-29: qty 1

## 2018-01-29 MED ORDER — HEPARIN SOD (PORK) LOCK FLUSH 100 UNIT/ML IV SOLN
500.0000 [IU] | Freq: Once | INTRAVENOUS | Status: AC | PRN
  Administered 2018-01-29: 500 [IU]
  Filled 2018-01-29: qty 5

## 2018-01-29 MED ORDER — SODIUM CHLORIDE 0.9% FLUSH
10.0000 mL | INTRAVENOUS | Status: DC | PRN
  Administered 2018-01-29: 10 mL
  Filled 2018-01-29: qty 10

## 2018-01-29 MED ORDER — MELOXICAM 7.5 MG PO TABS: 7.5000 mg | ORAL_TABLET | Freq: Two times a day (BID) | ORAL | 2 refills | Status: DC

## 2018-01-29 MED ORDER — FAMOTIDINE IN NACL 20-0.9 MG/50ML-% IV SOLN
INTRAVENOUS | Status: AC
  Filled 2018-01-29: qty 50

## 2018-01-29 MED ORDER — FAMOTIDINE IN NACL 20-0.9 MG/50ML-% IV SOLN
20.0000 mg | Freq: Once | INTRAVENOUS | Status: AC
  Administered 2018-01-29: 20 mg via INTRAVENOUS

## 2018-01-29 MED ORDER — DIPHENHYDRAMINE HCL 50 MG/ML IJ SOLN
50.0000 mg | Freq: Once | INTRAMUSCULAR | Status: AC
  Administered 2018-01-29: 50 mg via INTRAVENOUS

## 2018-01-29 NOTE — Patient Instructions (Signed)
[facility] Discharge Instructions for Patients Receiving Chemotherapy  Today you received the following chemotherapy agents :  Taxol.  To help prevent nausea and vomiting after your treatment, we encourage you to take your nausea medication as prescribed.   If you develop nausea and vomiting that is not controlled by your nausea medication, call the clinic.   BELOW ARE SYMPTOMS THAT SHOULD BE REPORTED IMMEDIATELY:  *FEVER GREATER THAN 100.5 F  *CHILLS WITH OR WITHOUT FEVER  NAUSEA AND VOMITING THAT IS NOT CONTROLLED WITH YOUR NAUSEA MEDICATION  *UNUSUAL SHORTNESS OF BREATH  *UNUSUAL BRUISING OR BLEEDING  TENDERNESS IN MOUTH AND THROAT WITH OR WITHOUT PRESENCE OF ULCERS  *URINARY PROBLEMS  *BOWEL PROBLEMS  UNUSUAL RASH Items with * indicate a potential emergency and should be followed up as soon as possible.  Feel free to call the clinic should you have any questions or concerns. The clinic phone number is [phone].  Please show the CHEMO ALERT CARD at check-in to the Emergency Department and triage nurse.   

## 2018-01-29 NOTE — Assessment & Plan Note (Signed)
08/16/2017: Right lumpectomy: Grade 2 IDC 1.5 cm, with DCIS and necrosis, 0/3 lymph nodes negative, ER 95%, PR 30%, HER-2 negative ratio 1.14, Ki-67 40%, T1CN0 stage Ia Resection of the margins 09/11/2017: Benign  Treatment plan: 1.Systemic chemotherapy with dose dense Adriamycin and Cytoxan x4 followed by Taxol weekly x12 2. adjuvant radiation therapy 3 followed by adjuvant antiestrogen therapy. -------------------------------------------------------------------------------- Current treatment:Completed 4 cycles ofdose dense Adriamycin and Cytoxan, today is cycle11Taxol  Taxoltoxicities: 1. Fatigue due to chemotherapy: It is getting slightly more pronounced as more treatments gone. 2. Neuropathy grade 1: Monitoring at this time.  If next week her neuropathy gets worse then we may have to reduce the dosage of Taxol.  Monitoring closely for chemo toxicities Return to clinic in 1 week for her last cycle of chemotherapy

## 2018-01-29 NOTE — Progress Notes (Signed)
Patient Care Team: [name] as PCP - General (Family Medicine)  DIAGNOSIS:  Encounter Diagnoses  Name Primary?  . Malignant neoplasm of lower-inner quadrant of right breast of female, estrogen receptor positive ([place])   . Encounter for antineoplastic chemotherapy Yes    SUMMARY OF ONCOLOGIC HISTORY:   Malignant neoplasm of lower-inner quadrant of right breast of female, estrogen receptor positive ([place])   08/16/2017 Initial Diagnosis    Right lumpectomy: Grade 2 IDC 1.5 cm, with DCIS and necrosis, 0/3 lymph nodes negative, ER 95%, PR 30%, HER-2 negative ratio 1.14, Ki-67 40%, T1CN0 stage Ia; resection of the margin 09/11/2017: Benign    08/16/2017 Oncotype testing    Oncotype DX recurrence score 31: 19% risk of recurrence at 9 years.  High risk    09/25/2017 -  Chemotherapy    Adjuvant chemotherapy with dose dense Adriamycin and Cytoxan x4 followed by Taxol weekly x12      CHIEF COMPLIANT: Cycle 11 Taxol  INTERVAL HISTORY: MARRI Jasmine Allen is a 52 year old with above-mentioned history of right breast cancer treated with lumpectomy and is now on adjuvant chemotherapy and today is cycle 11 of Taxol.  She is tolerating Taxol chemotherapy fairly well.  She has developed neuropathy.  However it is fairly mild and only in couple of ptosis and a few fingertips.  She has dropped a few objects but she thinks it is because she is clumsy and not necessarily neuropathy related.  REVIEW OF SYSTEMS:   Constitutional: Denies fevers, chills or abnormal weight loss Eyes: Denies blurriness of vision Ears, nose, mouth, throat, and face: Denies mucositis or sore throat Respiratory: Denies cough, dyspnea or wheezes Cardiovascular: Denies palpitation, chest discomfort Gastrointestinal:  Denies nausea, heartburn or change in bowel habits Skin: Denies abnormal skin rashes Lymphatics: Denies new lymphadenopathy or easy bruising Neurological: Peripheral neuropathy with numbness and  tingling Behavioral/Psych: Mood is stable, no new changes  Extremities: No lower extremity edema   All other systems were reviewed with the patient and are negative.  I have reviewed the past medical history, past surgical history, social history and family history with the patient and they are unchanged from previous note.  ALLERGIES:  is allergic to amoxicillin-pot clavulanate; erythromycin; metoclopramide hcl; and sulfonamide derivatives.  MEDICATIONS:  Current Outpatient Medications  Medication Sig Dispense Refill  . ALPRAZolam (XANAX) 0.5 MG tablet Take 0.5 mg by mouth at bedtime as needed for anxiety or sleep.     . Biotin 10000 MCG TABS Take 10,000 mcg by mouth daily.     . cloNIDine (CATAPRES) 0.3 MG tablet Take 0.3 mg by mouth at bedtime.     . cyclobenzaprine (FLEXERIL) 10 MG tablet Take 10 mg by mouth every 8 (eight) hours as needed for muscle spasms.  1  . furosemide (LASIX) 20 MG tablet Take 20 mg by mouth daily as needed for fluid.  5  . hydrocortisone valerate cream (WESTCORT) 0.2 % Apply 1 application topically daily as needed (eczema).     . hydrOXYzine (ATARAX/VISTARIL) 10 MG tablet Take 10-30 mg by mouth at bedtime as needed for itching.   0  . hyoscyamine (LEVSIN SL) 0.125 MG SL tablet dissolve 1 tablet under the tongue every 4 hours if needed (Patient taking differently: Take 0.125 mg by mouth every 4 (four) hours as needed for cramping. ) 30 tablet 0  . LORazepam (ATIVAN) 0.5 MG tablet TAKE 1 TABLET(0.5 MG) BY MOUTH AT BEDTIME AS NEEDED FOR NAUSEA OR VOMITING (Patient taking differently:  Take 0.5 mg by mouth at bedtime. ) 30 tablet 0  . Melatonin 5 MG CAPS Take 5 mg by mouth at bedtime as needed (for sleep).     . meloxicam (MOBIC) 7.5 MG tablet Take 1 tablet (7.5 mg total) by mouth 2 (two) times daily. 60 tablet 2  . Multiple Vitamin (MULITIVITAMIN WITH MINERALS) TABS Take 1 tablet by mouth daily.    . ondansetron (ZOFRAN) 8 MG tablet Take 1 tablet (8 mg total) by  mouth 2 (two) times daily as needed. Start on the third day after chemotherapy. 30 tablet 1  . pantoprazole (PROTONIX) 40 MG tablet Take 1 tablet (40 mg total) by mouth 2 (two) times daily before a meal. (Patient taking differently: Take 40 mg by mouth daily. ) 60 tablet 6  . prochlorperazine (COMPAZINE) 10 MG tablet Take 1 tablet (10 mg total) by mouth every 6 (six) hours as needed (Nausea or vomiting). 30 tablet 1  . spironolactone (ALDACTONE) 25 MG tablet Take 25 mg by mouth daily.    . traZODone (DESYREL) 100 MG tablet Take 100 mg by mouth at bedtime.     . valACYclovir (VALTREX) 1000 MG tablet Take 1,000 mg by mouth 2 (two) times daily as needed (for out breaks).      No current facility-administered medications for this visit.    Facility-Administered Medications Ordered in Other Visits  Medication Dose Route Frequency Provider Last Rate Last Dose  . sodium chloride flush (NS) 0.9 % injection 10 mL  10 mL Intracatheter PRN Nicholas Lose, MD   10 mL at 01/29/18 1053    PHYSICAL EXAMINATION: ECOG PERFORMANCE STATUS: 1 - Symptomatic but completely ambulatory  Vitals:   01/29/18 1108  BP: 138/66  Pulse: 91  Resp: 17  Temp: 97.8 F (36.6 C)  SpO2: 100%   Filed Weights   01/29/18 1108  Weight: 126 lb 3.2 oz (57.2 kg)    GENERAL:alert, no distress and comfortable SKIN: skin color, texture, turgor are normal, no rashes or significant lesions EYES: normal, Conjunctiva are pink and non-injected, sclera clear OROPHARYNX:no exudate, no erythema and lips, buccal mucosa, and tongue normal  NECK: supple, thyroid normal size, non-tender, without nodularity LYMPH:  no palpable lymphadenopathy in the cervical, axillary or inguinal LUNGS: clear to auscultation and percussion with normal breathing effort HEART: regular rate & rhythm and no murmurs and no lower extremity edema ABDOMEN:abdomen soft, non-tender and normal bowel sounds MUSCULOSKELETAL:no cyanosis of digits and no clubbing   NEURO: alert & oriented x 3 with fluent speech, peripheral neuropathy EXTREMITIES: No lower extremity edema   LABORATORY DATA:  I have reviewed the data as listed CMP Latest Ref Rng & Units 01/22/2018 01/15/2018 01/08/2018  Glucose 70 - 99 mg/dL 87 84 106(H)  BUN 6 - 20 mg/dL _0 Creatinine 0.44 - 1.00 mg/dL 0.60 0.66 0.63  Sodium 135 - 145 mmol/L 138 139 140  Potassium 3.5 - 5.1 mmol/L 4.4 4.1 4.2  Chloride 98 - 111 mmol/L 105 105 106  CO2 22 - 32 mmol/L _1 Calcium 8.9 - 10.3 mg/dL 9.1 9.2 9.4  Total Protein 6.5 - 8.1 g/dL 6.1(L) 6.4(L) 6.4(L)  Total Bilirubin 0.3 - 1.2 mg/dL 0.2(L) 0.3 0.3  Alkaline Phos 38 - 126 U/L 51 53 53  AST 15 - 41 U/L _2 ALT 0 - 44 U/L _3 Lab Results  Component Value Date   WBC 3.4 (L) 01/29/2018  HGB 11.0 (L) 01/29/2018   HCT 32.0 (L) 01/29/2018   MCV 97.9 01/29/2018   PLT 209 01/29/2018   NEUTROABS 2.1 01/29/2018    ASSESSMENT & PLAN:  Malignant neoplasm of lower-inner quadrant of right breast of female, estrogen receptor positive ([place]) 08/16/2017: Right lumpectomy: Grade 2 IDC 1.5 cm, with DCIS and necrosis, 0/3 lymph nodes negative, ER 95%, PR 30%, HER-2 negative ratio 1.14, Ki-67 40%, T1CN0 stage Ia Resection of the margins 09/11/2017: Benign  Treatment plan: 1.Systemic chemotherapy with dose dense Adriamycin and Cytoxan x4 followed by Taxol weekly x12 2. adjuvant radiation therapy 3 followed by adjuvant antiestrogen therapy. -------------------------------------------------------------------------------- Current treatment:Completed 4 cycles ofdose dense Adriamycin and Cytoxan, today is cycle11Taxol  Taxoltoxicities: 1. Fatigue due to chemotherapy: It is getting slightly more pronounced as more treatments gone. 2. Neuropathy grade 1: Monitoring at this time.  Monitoring closely for chemo toxicities Return to clinic in 1 week for her last cycle of chemotherapy We will make arrangements for her to  see radiation oncology. Echocardiogram will need to be set up. Return to clinic at the end of radiation.    Orders Placed This Encounter  Procedures  . ECHOCARDIOGRAM COMPLETE    Standing Status:   Future    Standing Expiration Date:   05/02/2019    Order Specific Question:   Where should this test be performed    Answer:   [facility]    Order Specific Question:   Perflutren DEFINITY (image enhancing agent) should be administered unless hypersensitivity or allergy exist    Answer:   Administer Perflutren    Order Specific Question:   Other Comments    Answer:   Post adriamycin chemo   The patient has a good understanding of the overall plan. she agrees with it. she will call with any problems that may develop before the next visit here.   Harriette Ohara, MD 01/29/18

## 2018-02-04 ENCOUNTER — Encounter: Payer: Self-pay | Admitting: Radiation Oncology

## 2018-02-05 ENCOUNTER — Encounter: Payer: Self-pay | Admitting: *Deleted

## 2018-02-05 ENCOUNTER — Inpatient Hospital Stay: Payer: 59

## 2018-02-05 VITALS — BP 128/86 | HR 85 | Temp 98.0°F | Resp 18

## 2018-02-05 DIAGNOSIS — Z170 Estrogen receptor positive status [ER+]: Principal | ICD-10-CM

## 2018-02-05 DIAGNOSIS — C50311 Malignant neoplasm of lower-inner quadrant of right female breast: Secondary | ICD-10-CM

## 2018-02-05 DIAGNOSIS — Z95828 Presence of other vascular implants and grafts: Secondary | ICD-10-CM

## 2018-02-05 DIAGNOSIS — Z5111 Encounter for antineoplastic chemotherapy: Secondary | ICD-10-CM | POA: Diagnosis not present

## 2018-02-05 LAB — CMP (CANCER CENTER ONLY)
ALT: 15 U/L (ref 0–44)
AST: 21 U/L (ref 15–41)
Albumin: 3.5 g/dL (ref 3.5–5.0)
Alkaline Phosphatase: 49 U/L (ref 38–126)
Anion gap: 8 (ref 5–15)
BUN: 13 mg/dL (ref 6–20)
CO2: 27 mmol/L (ref 22–32)
Calcium: 9.2 mg/dL (ref 8.9–10.3)
Chloride: 104 mmol/L (ref 98–111)
Creatinine: 0.69 mg/dL (ref 0.44–1.00)
GFR, Est AFR Am: >60 mL/min (ref >60)
GFR, Estimated: >60 mL/min (ref >60)
Glucose, Bld: 122 mg/dL — ABNORMAL HIGH (ref 70–99)
Potassium: 3.9 mmol/L (ref 3.5–5.1)
Sodium: 139 mmol/L (ref 135–145)
Total Bilirubin: 0.3 mg/dL (ref 0.3–1.2)
Total Protein: 6 g/dL — ABNORMAL LOW (ref 6.5–8.1)

## 2018-02-05 LAB — CBC WITH DIFFERENTIAL (CANCER CENTER ONLY)
Abs Immature Granulocytes: 0.01 10*3/uL (ref 0.00–0.07)
Basophils Absolute: 0 10*3/uL (ref 0.0–0.1)
Basophils Relative: 1 %
Eosinophils Absolute: 0.1 10*3/uL (ref 0.0–0.5)
Eosinophils Relative: 3 %
HCT: 33.9 % — ABNORMAL LOW (ref 36.0–46.0)
Hemoglobin: 11.4 g/dL — ABNORMAL LOW (ref 12.0–15.0)
Immature Granulocytes: 0 %
Lymphocytes Relative: 26 %
Lymphs Abs: 0.8 10*3/uL (ref 0.7–4.0)
MCH: 32.9 pg (ref 26.0–34.0)
MCHC: 33.6 g/dL (ref 30.0–36.0)
MCV: 98 fL (ref 80.0–100.0)
Monocytes Absolute: 0.3 10*3/uL (ref 0.1–1.0)
Monocytes Relative: 11 %
Neutro Abs: 1.8 10*3/uL (ref 1.7–7.7)
Neutrophils Relative %: 59 %
Platelet Count: 210 10*3/uL (ref 150–400)
RBC: 3.46 MIL/uL — ABNORMAL LOW (ref 3.87–5.11)
RDW: 12.8 % (ref 11.5–15.5)
WBC Count: 3 10*3/uL — ABNORMAL LOW (ref 4.0–10.5)
nRBC: 0 % (ref 0.0–0.2)

## 2018-02-05 MED ORDER — FAMOTIDINE IN NACL 20-0.9 MG/50ML-% IV SOLN
20.0000 mg | Freq: Once | INTRAVENOUS | Status: AC
  Administered 2018-02-05: 20 mg via INTRAVENOUS

## 2018-02-05 MED ORDER — SODIUM CHLORIDE 0.9% FLUSH
10.0000 mL | INTRAVENOUS | Status: DC | PRN
  Administered 2018-02-05: 10 mL
  Filled 2018-02-05: qty 10

## 2018-02-05 MED ORDER — FAMOTIDINE IN NACL 20-0.9 MG/50ML-% IV SOLN
INTRAVENOUS | Status: AC
  Filled 2018-02-05: qty 50

## 2018-02-05 MED ORDER — DIPHENHYDRAMINE HCL 50 MG/ML IJ SOLN
INTRAMUSCULAR | Status: AC
  Filled 2018-02-05: qty 1

## 2018-02-05 MED ORDER — DIPHENHYDRAMINE HCL 50 MG/ML IJ SOLN
50.0000 mg | Freq: Once | INTRAMUSCULAR | Status: AC
  Administered 2018-02-05: 50 mg via INTRAVENOUS

## 2018-02-05 MED ORDER — SODIUM CHLORIDE 0.9 % IV SOLN
80.0000 mg/m2 | Freq: Once | INTRAVENOUS | Status: AC
  Administered 2018-02-05: 120 mg via INTRAVENOUS
  Filled 2018-02-05: qty 20

## 2018-02-05 MED ORDER — HEPARIN SOD (PORK) LOCK FLUSH 100 UNIT/ML IV SOLN
500.0000 [IU] | Freq: Once | INTRAVENOUS | Status: AC | PRN
  Administered 2018-02-05: 500 [IU]
  Filled 2018-02-05: qty 5

## 2018-02-05 MED ORDER — SODIUM CHLORIDE 0.9 % IV SOLN
Freq: Once | INTRAVENOUS | Status: AC
  Administered 2018-02-05: 10:00:00 via INTRAVENOUS
  Filled 2018-02-05: qty 250

## 2018-02-05 NOTE — Patient Instructions (Signed)
[facility] Discharge Instructions for Patients Receiving Chemotherapy  Today you received the following chemotherapy agents:  Taxol.  To help prevent nausea and vomiting after your treatment, we encourage you to take your nausea medication as directed.   If you develop nausea and vomiting that is not controlled by your nausea medication, call the clinic.   BELOW ARE SYMPTOMS THAT SHOULD BE REPORTED IMMEDIATELY:  *FEVER GREATER THAN 100.5 F  *CHILLS WITH OR WITHOUT FEVER  NAUSEA AND VOMITING THAT IS NOT CONTROLLED WITH YOUR NAUSEA MEDICATION  *UNUSUAL SHORTNESS OF BREATH  *UNUSUAL BRUISING OR BLEEDING  TENDERNESS IN MOUTH AND THROAT WITH OR WITHOUT PRESENCE OF ULCERS  *URINARY PROBLEMS  *BOWEL PROBLEMS  UNUSUAL RASH Items with * indicate a potential emergency and should be followed up as soon as possible.  Feel free to call the clinic should you have any questions or concerns. The clinic phone number is [phone].  Please show the CHEMO ALERT CARD at check-in to the Emergency Department and triage nurse.   

## 2018-02-12 ENCOUNTER — Other Ambulatory Visit (HOSPITAL_COMMUNITY): Payer: 59

## 2018-02-14 ENCOUNTER — Telehealth: Payer: Self-pay | Admitting: *Deleted

## 2018-02-14 DIAGNOSIS — R600 Localized edema: Secondary | ICD-10-CM

## 2018-02-14 NOTE — Telephone Encounter (Signed)
Received TC from patient this am. She states she is having ongoing problems with her feet an ankles swelling.  It had been confined to her left foot and ankle but is now both feet and ankles.  I t has gotten worse since she saw Dr. Lindi Adie last, which was on 01/29/18.  She dis have a LE doppler study done on 01/20/18 and had no evidence of a DVT at that time.  She has been taking prn Lasix 20-40 mg daily but it is not helping as much as it had in the past. She does not take a potassium supplement, though she has been eating a banana daily.  Her last Taxol treatment was on 02/05/18.  Encouraged pt to ambulate as much as possible and when she is sitting down, elevate her legs and feet. She is due for a echocardiogram tomorrow and would like to be seen after wards to have swelling evaluated. High priority scheduling message sent for labs and [facility] tomorrow after her cardiac echo. Labs ordered.  Pt voiced understanding to the above.

## 2018-02-15 ENCOUNTER — Ambulatory Visit (HOSPITAL_COMMUNITY)
Admission: RE | Admit: 2018-02-15 | Discharge: 2018-02-15 | Disposition: A | Discharge status: Home / Self Care | Payer: 59 | Source: Ambulatory Visit | Attending: Hematology and Oncology | Admitting: Hematology and Oncology

## 2018-02-15 ENCOUNTER — Inpatient Hospital Stay: Payer: 59

## 2018-02-15 ENCOUNTER — Inpatient Hospital Stay: Payer: 59 | Attending: Hematology and Oncology | Admitting: Medical

## 2018-02-15 VITALS — BP 141/74 | HR 89 | Temp 97.9°F | Resp 17 | Ht 60.0 in | Wt 124.0 lb

## 2018-02-15 DIAGNOSIS — Z720 Tobacco use: Secondary | ICD-10-CM | POA: Insufficient documentation

## 2018-02-15 DIAGNOSIS — I209 Angina pectoris, unspecified: Secondary | ICD-10-CM | POA: Insufficient documentation

## 2018-02-15 DIAGNOSIS — R600 Localized edema: Secondary | ICD-10-CM | POA: Diagnosis not present

## 2018-02-15 DIAGNOSIS — Z170 Estrogen receptor positive status [ER+]: Secondary | ICD-10-CM | POA: Diagnosis not present

## 2018-02-15 DIAGNOSIS — Z5111 Encounter for antineoplastic chemotherapy: Secondary | ICD-10-CM

## 2018-02-15 DIAGNOSIS — Z9221 Personal history of antineoplastic chemotherapy: Secondary | ICD-10-CM | POA: Insufficient documentation

## 2018-02-15 DIAGNOSIS — I340 Nonrheumatic mitral (valve) insufficiency: Secondary | ICD-10-CM | POA: Insufficient documentation

## 2018-02-15 DIAGNOSIS — C50311 Malignant neoplasm of lower-inner quadrant of right female breast: Secondary | ICD-10-CM | POA: Diagnosis not present

## 2018-02-15 DIAGNOSIS — Z87891 Personal history of nicotine dependence: Secondary | ICD-10-CM | POA: Insufficient documentation

## 2018-02-15 LAB — CBC WITH DIFFERENTIAL (CANCER CENTER ONLY)
Abs Immature Granulocytes: 0.01 10*3/uL (ref 0.00–0.07)
Basophils Absolute: 0 10*3/uL (ref 0.0–0.1)
Basophils Relative: 1 %
Eosinophils Absolute: 0 10*3/uL (ref 0.0–0.5)
Eosinophils Relative: 1 %
HCT: 35.7 % — ABNORMAL LOW (ref 36.0–46.0)
Hemoglobin: 12 g/dL (ref 12.0–15.0)
Immature Granulocytes: 0 %
Lymphocytes Relative: 25 %
Lymphs Abs: 1.1 10*3/uL (ref 0.7–4.0)
MCH: 32.4 pg (ref 26.0–34.0)
MCHC: 33.6 g/dL (ref 30.0–36.0)
MCV: 96.5 fL (ref 80.0–100.0)
Monocytes Absolute: 0.5 10*3/uL (ref 0.1–1.0)
Monocytes Relative: 12 %
Neutro Abs: 2.6 10*3/uL (ref 1.7–7.7)
Neutrophils Relative %: 61 %
Platelet Count: 260 10*3/uL (ref 150–400)
RBC: 3.7 MIL/uL — ABNORMAL LOW (ref 3.87–5.11)
RDW: 13 % (ref 11.5–15.5)
WBC Count: 4.3 10*3/uL (ref 4.0–10.5)
nRBC: 0 % (ref 0.0–0.2)

## 2018-02-15 LAB — CMP (CANCER CENTER ONLY)
ALT: 13 U/L (ref 0–44)
AST: 21 U/L (ref 15–41)
Albumin: 3.8 g/dL (ref 3.5–5.0)
Alkaline Phosphatase: 49 U/L (ref 38–126)
Anion gap: 10 (ref 5–15)
BUN: 12 mg/dL (ref 6–20)
CO2: 27 mmol/L (ref 22–32)
Calcium: 9.4 mg/dL (ref 8.9–10.3)
Chloride: 104 mmol/L (ref 98–111)
Creatinine: 0.71 mg/dL (ref 0.44–1.00)
GFR, Est AFR Am: >60 mL/min (ref >60)
GFR, Estimated: >60 mL/min (ref >60)
Glucose, Bld: 88 mg/dL (ref 70–99)
Potassium: 4.4 mmol/L (ref 3.5–5.1)
Sodium: 141 mmol/L (ref 135–145)
Total Bilirubin: 0.3 mg/dL (ref 0.3–1.2)
Total Protein: 6.4 g/dL — ABNORMAL LOW (ref 6.5–8.1)

## 2018-02-15 NOTE — Progress Notes (Signed)
  Echocardiogram 2D Echocardiogram has been performed.  Jasmine Allen 02/15/2018, 11:52 AM

## 2018-02-18 ENCOUNTER — Telehealth: Payer: Self-pay | Admitting: *Deleted

## 2018-02-18 ENCOUNTER — Telehealth: Payer: Self-pay | Admitting: Medical

## 2018-02-18 NOTE — Telephone Encounter (Signed)
No los per 11/8 °

## 2018-02-18 NOTE — Telephone Encounter (Signed)
Received TC from patient inquiring about her recent cardiac echo results.  The echo was done on Friday, 02/15/18. Reviewed results with her. No change from initail cardiac echo done in June 2019.  She states she is feeling better except fatigue that comes and goes. Advised patient that it takes time to rebuild one's endurance and energy after going through chemo, to take frequent rest breaks between activities. Advised her that gradually she will notice increased energy. Pt voiced appreciation for advise and results. No further questions or concerns.

## 2018-02-19 NOTE — Progress Notes (Signed)
Symptoms Management Clinic Progress Note   [name] [id number] 09-23-1965 52 y.o.  Jasmine Allen is managed by Dr. Nicholas Lose   Actively treated with chemotherapy/immunotherapy: yes  Current Therapy: Paclitaxel  Last Treated: 02/05/2018 (cycle 12, day 1)  Assessment: Plan:    Bilateral lower extremity edema  Malignant neoplasm of lower-inner quadrant of right breast of female, estrogen receptor positive ([place])   Bilateral lower extremity edema: The patient was reassured that her lower extremity edema does not appear to be concerning.  She was instructed to continue taking Lasix 20-40 mg/day.  She had an echocardiogram completed earlier.  These results are pending.  ER positive malignant neoplasm of the right breast: The patient is status post cycle 12, day 1 of paclitaxel which was dosed on 02/05/2018.  She will be seeing Dr. Lisbeth Renshaw in radiation oncology on 02/26/2018.  There is no follow-up appointment with Dr. Lindi Adie.  I will make him aware.  Please see After Visit Summary for patient specific instructions.  Future Appointments  Date Time Provider [place]  02/26/2018  9:30 AM [facility] NURSE [facility] None  02/26/2018 10:00 AM Kyung Rudd, MD [facility] None    No orders of the defined types were placed in this encounter.      Subjective:   Patient ID:  Jasmine Allen is a 52 y.o. (DOB [birth year]) female.  Chief Complaint: No chief complaint on file.   HPI [name] is a 52 year old female with a diagnosis of an ER positive malignant neoplasm of the right breast.  She is status post cycle 12, day 1 of paclitaxel which was dosed on 02/05/2018.  She is to see Dr. Lisbeth Renshaw in radiation oncology on 02/26/2018.  She presents to the office today stating that she is having ongoing bilateral lower extremity edema in her feet and ankles.  She states that it is worsened since she was seen on 01/29/2018.  A lower extremity Doppler study was  completed on 03/22/2018 which showed no evidence of a DVT.  She has been taking Lasix as needed and generally takes between 20-40 mg/day.  She states it is not helping as much as it had in the past.  She had an echocardiogram completed earlier today.  Results are pending.  She is not on supplemental potassium but has been eating a banana daily.  Her labs from today showed that her potassium was 4.4.  She denies any other issues of concern.  She is not having shortness of breath or chest pain.  Medications: I have reviewed the patient's current medications.  Allergies:  Allergies  Allergen Reactions  . Amoxicillin-Pot Clavulanate Other (See Comments)    Big doses cause diarrhea   . Erythromycin Rash  . Metoclopramide Hcl Other (See Comments)    Restless legs  . Sulfonamide Derivatives Rash    Past Medical History:  Diagnosis Date  . Alopecia, unspecified   . Anemia, unspecified    during her pregnancy  . Anxiety state, unspecified   . Bulging disc   . Depressive disorder, not elsewhere classified   . Diverticulosis of colon (without mention of hemorrhage)    CT Scan  . Dysrhythmia    PVCs in the past  . Esophageal reflux   . Esophagitis 1998  . Hiatal hernia 1998  . History of kidney stones   . Hypercholesteremia   . Hypertension   . Migraine   . Opioid abuse, in remission [facility])     Past Surgical History:  Procedure Laterality Date  . ABLATION    . BREAST LUMPECTOMY WITH AXILLARY LYMPH NODE BIOPSY Right 08/16/2017   Procedure: RIGHT BREAST LUMPECTOMY WITH AXILLARY SENTINEL LYMPH NODE BIOPSY;  Surgeon: Rolm Bookbinder, MD;  Location: [facility];  Service: General;  Laterality: Right;  . CARDIAC CATHETERIZATION  2011   normal coronaries  . KNEE SURGERY    . PORTACATH PLACEMENT Right 09/11/2017   Procedure: INSERTION PORT-A-CATH WITH ULTRASOUND;  Surgeon: Rolm Bookbinder, MD;  Location: [facility];  Service: General;  Laterality: Right;  . RE-EXCISION OF BREAST  LUMPECTOMY Right 09/11/2017   Procedure: RE-EXCISION OF BREAST LUMPECTOMY;  Surgeon: Rolm Bookbinder, MD;  Location: [place];  Service: General;  Laterality: Right;  . TONSILLECTOMY      Family History  Problem Relation Age of Onset  . Heart disease Father        CABG  . Breast cancer Mother   . Breast cancer Maternal Grandmother   . Breast cancer Cousin   . Colon cancer Neg Hx   . Stomach cancer Neg Hx     Social History   Socioeconomic History  . Marital status: Married    Spouse name: Not on file  . Number of children: Not on file  . Years of education: Not on file  . Highest education level: Not on file  Occupational History  . Not on file  Social Needs  . Financial resource strain: Not on file  . Food insecurity:    Worry: Not on file    Inability: Not on file  . Transportation needs:    Medical: Not on file    Non-medical: Not on file  Tobacco Use  . Smoking status: Former Smoker    Packs/day: 0.25    Last attempt to quit: 08/25/2016    Years since quitting: 1.4  . Smokeless tobacco: Never Used  Substance and Sexual Activity  . Alcohol use: Yes    Comment: "once a week"  . Drug use: No  . Sexual activity: Yes    Birth control/protection: None, Post-menopausal  Lifestyle  . Physical activity:    Days per week: Not on file    Minutes per session: Not on file  . Stress: Not on file  Relationships  . Social connections:    Talks on phone: Not on file    Gets together: Not on file    Attends religious service: Not on file    Active member of club or organization: Not on file    Attends meetings of clubs or organizations: Not on file    Relationship status: Not on file  . Intimate partner violence:    Fear of current or ex partner: Not on file    Emotionally abused: Not on file    Physically abused: Not on file    Forced sexual activity: Not on file  Other Topics Concern  . Not on file  Social History Narrative  . Not on file     Past Medical History, Surgical history, Social history, and Family history were reviewed and updated as appropriate.   Please see review of systems for further details on the patient's review from today.   Review of Systems:  Review of Systems  Constitutional: Negative for chills, diaphoresis and fever.  HENT: Negative for trouble swallowing and voice change.   Respiratory: Negative for cough, chest tightness, shortness of breath and wheezing.   Cardiovascular: Positive for leg swelling. Negative for chest pain and palpitations.  Gastrointestinal: Negative for abdominal pain, constipation, diarrhea, nausea and vomiting.  Musculoskeletal: Negative for back pain and myalgias.  Neurological: Negative for dizziness, light-headedness and headaches.    Objective:   Physical Exam:  BP (!) 141/74 (BP Location: Left Arm, Patient Position: Sitting)   Pulse 89   Temp 97.9 F (36.6 C) (Oral)   Resp 17   Ht 5' (1.524 m)   Wt 124 lb (56.2 kg)   LMP 12/24/2010   SpO2 100%   BMI 24.22 kg/m  ECOG: 0  Physical Exam  Constitutional: No distress.  HENT:  Head: Normocephalic and atraumatic.  Cardiovascular: Normal rate, regular rhythm and normal heart sounds. Exam reveals no gallop and no friction rub.  No murmur heard. Pulmonary/Chest: Effort normal and breath sounds normal. No respiratory distress. She has no wheezes. She has no rales.  Musculoskeletal: She exhibits edema (Trace bilateral lower extremity edema over the ankles and feet bilaterally.).  Neurological: She is alert.  Skin: Skin is warm and dry. No rash noted. She is not diaphoretic. No erythema.    Lab Review:     Component Value Date/Time   NA 141 02/15/2018 1111   K 4.4 02/15/2018 1111   CL 104 02/15/2018 1111   CO2 27 02/15/2018 1111   GLUCOSE 88 02/15/2018 1111   BUN 12 02/15/2018 1111   CREATININE 0.71 02/15/2018 1111   CALCIUM 9.4 02/15/2018 1111   PROT 6.4 (L) 02/15/2018 1111   ALBUMIN 3.8 02/15/2018 1111    AST 21 02/15/2018 1111   ALT 13 02/15/2018 1111   ALKPHOS 49 02/15/2018 1111   BILITOT 0.3 02/15/2018 1111   GFRNONAA >60 02/15/2018 1111   GFRAA >60 02/15/2018 1111       Component Value Date/Time   WBC 4.3 02/15/2018 1111   WBC 7.2 08/14/2017 1116   RBC 3.70 (L) 02/15/2018 1111   HGB 12.0 02/15/2018 1111   HCT 35.7 (L) 02/15/2018 1111   PLT 260 02/15/2018 1111   MCV 96.5 02/15/2018 1111   MCH 32.4 02/15/2018 1111   MCHC 33.6 02/15/2018 1111   RDW 13.0 02/15/2018 1111   LYMPHSABS 1.1 02/15/2018 1111   MONOABS 0.5 02/15/2018 1111   EOSABS 0.0 02/15/2018 1111   BASOSABS 0.0 02/15/2018 1111   -------------------------------  Imaging from last 24 hours (if applicable):  Radiology interpretation: No results found.

## 2018-02-22 ENCOUNTER — Telehealth: Payer: Self-pay | Admitting: *Deleted

## 2018-02-22 NOTE — Telephone Encounter (Signed)
Received TC from patient regarding ongoing issue with ankle and foot edema.  She saw Sandi Mealy, PA about this concern on 02/15/18  And is still having difficulty managing this. She states the discomfort from the swelling is keeping her awake at night and her feet are still swollen in the morning.  She states she is trying NOT to take the lasix for the swelling, hoping that it will go away over time. She is very tearful and doesn't know what else to do. Dr. Lindi Adie is not in the office today. Spoke with Sandi Mealy, PA and he gave instructions for pt to take lasix 40 mg daily for three days and to f/u with her PCP next week as well. Pt has been on diuretics for many years as well as high BP meds. TCT patient with the above information.  Pt voiced understanding of instructions as well as her frustration at not improving as quickly as she would like to.  Offered encouragement and support as she goes through this phase of recovery.  She will call her PCP to f/u with her.Also advised if she was overall unchanged in dealing with the swelling to call to set up an appt with Dr. Lindi Adie. She stated she would see how the weekend goes with Lasix 40 mg daily and if no better then she will call back.

## 2018-02-25 ENCOUNTER — Other Ambulatory Visit: Payer: Self-pay

## 2018-02-25 DIAGNOSIS — C50311 Malignant neoplasm of lower-inner quadrant of right female breast: Secondary | ICD-10-CM

## 2018-02-25 DIAGNOSIS — Z170 Estrogen receptor positive status [ER+]: Principal | ICD-10-CM

## 2018-02-25 MED ORDER — BUMETANIDE 1 MG PO TABS: 1.0000 mg | ORAL_TABLET | Freq: Two times a day (BID) | ORAL | 0 refills | Status: DC

## 2018-02-25 NOTE — Progress Notes (Signed)
Location of Breast Cancer: Malignant neoplasm of lower inner quadrant of right breast of female, estrogen receptor positive  Did patient present with symptoms (if so, please note symptoms) or was this found on screening mammography?: Patient felt palpable mass.  Histology per Pathology Report: Right Breast Margin 09/11/2017    Right Lumpectomy 08/16/2017  Receptor Status: ER(95% +), PR (30% +), Her2-neu (-), Ki-67(40%)   Past/Anticipated interventions by surgeon, if any: Dr. Donne Hazel -Right Lumpectomy 08/16/2017- Grade 2 IDC 1.5 cm, with DCIS and necrosis, 0/3 lymph nodes negative. -Resection of margins 09/11/2017, benign.   Past/Anticipated interventions by medical oncology, if any: Chemotherapy  Dr. Lindi Adie 01/29/2018 Treatment Plan -Systemic chemotherapy with dose dense Adriamycin and Cytoxan x4 followed by Taxol weekly x12. -adjuvant radiation therapy, -followed by adjuvant antiestrogen therapy. -Adjuvant chemotherapy- taxol, completed 12 cycles. -Return to clinic at the end of radiation.  Lymphedema issues, if any: Mostly in face, legs, ankles and feet.  Haven't noticed any in her arm.  Pain issues, if any:  Some soreness.  BP 115/76 (BP Location: Left Arm, Patient Position: Sitting)   Pulse 98   Temp 98.4 F (36.9 C) (Oral)   Resp 20   Ht 5' (1.524 m)   Wt 121 lb 3.2 oz (55 kg)   LMP 12/24/2010   SpO2 100%   BMI 23.67 kg/m    Wt Readings from Last 3 Encounters:  02/26/18 121 lb 3.2 oz (55 kg)  02/15/18 124 lb (56.2 kg)  01/29/18 126 lb 3.2 oz (57.2 kg)    SAFETY ISSUES:  Prior radiation? No,  Pacemaker/ICD? No  Possible current pregnancy? No  Is the patient on methotrexate? No  Current Complaints / other details:      Cori Razor, RN 02/25/2018,10:41 AM

## 2018-02-25 NOTE — Progress Notes (Signed)
Pt called to see if she could get referral to lymphedema clinic for lymphedema management. Pt is not currently having lymphedema in the arm, but has had some dependent edema post chemo x 3-4weeks ago. Pt is currently taking lasix po, 40 daily. Pt has had little effectiveness with lasix. Per Dr.Gudena, change lasix to bumex 1mg  po BID until edema resolves, and then take as PRN. Pt also advised that edema post chemo will usually resolve on its own and can take 3-6 months to resolve. Pt instructed to stay off her feet for too long and elevate legs as much as possible. Also, it would be helpful for pt to wear compression stockings to help decrease edema, while on her feet. Pt verbalized understanding and would still appreciate a referral for PT rehab. Dr.Gudena is okay with this. Sent referral to cancer rehab. Pt knows to call for any further concerns.

## 2018-02-26 ENCOUNTER — Ambulatory Visit
Admission: RE | Admit: 2018-02-26 | Discharge: 2018-02-26 | Disposition: A | Discharge status: Home / Self Care | Payer: 59 | Source: Ambulatory Visit | Attending: Radiation Oncology | Admitting: Radiation Oncology

## 2018-02-26 ENCOUNTER — Encounter: Payer: Self-pay | Admitting: Radiation Oncology

## 2018-02-26 ENCOUNTER — Other Ambulatory Visit: Payer: Self-pay

## 2018-02-26 VITALS — BP 115/76 | HR 98 | Temp 98.4°F | Resp 20 | Ht 60.0 in | Wt 121.2 lb

## 2018-02-26 DIAGNOSIS — Z170 Estrogen receptor positive status [ER+]: Secondary | ICD-10-CM | POA: Diagnosis not present

## 2018-02-26 DIAGNOSIS — C50311 Malignant neoplasm of lower-inner quadrant of right female breast: Secondary | ICD-10-CM | POA: Insufficient documentation

## 2018-02-26 DIAGNOSIS — Z87891 Personal history of nicotine dependence: Secondary | ICD-10-CM | POA: Diagnosis not present

## 2018-02-26 NOTE — Progress Notes (Signed)
Radiation Oncology         [phone]) [phone] ________________________________  Name: Jasmine Allen        MRN: [id number]  Date of Service: 02/26/2018 DOB: August 08, 1965  JS:HFWYOV, Baldemar Friday., PA-C  Nicholas Lose, MD     REFERRING PHYSICIAN: Nicholas Lose, MD   DIAGNOSIS: The encounter diagnosis was Malignant neoplasm of lower-inner quadrant of right breast of female, estrogen receptor positive ([place]).   HISTORY OF PRESENT ILLNESS: Jasmine Allen is a 52 y.o. female with a recently diagnosed right breast cancer.  The patient was found to have a lump on breast imaging in the right breast in April 2019.  She met with Dr. Garnette Czech and was counseled on the findings.  She did not have any concerns for adenopathy prior to surgery and on 08/26/2017 underwent lumpectomy of the right breast with sentinel lymph node biopsy.  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  She had focally positive anterior margin, and 3 sentinel lymph nodes that were negative.  Her tumor was ER PR positive, HER-2 negative with a Ki-67 of 40%.  She did undergo reexcision of her anterior margin on 09/11/2017 which was negative for residual disease.  She had a postoperative Oncotype score of 31 and hence was offered chemotherapy.  She completed dose dense TAC between 09/25/2017 and 02/05/2018.  He comes today to discuss options of radiotherapy.  Of note she did very well during chemotherapy but has had issues with extremity edema in the lower part of her body as well as some facial edema.  She states that this predates chemotherapy but was never as severe.  She has been on Lasix and spironolactone for this.  She is scheduled to meet with physical therapy tomorrow.   PREVIOUS RADIATION THERAPY: No   PAST MEDICAL HISTORY:  Past Medical History:  Diagnosis Date  . Alopecia, unspecified   . Anemia, unspecified    during her pregnancy  . Anxiety state, unspecified   . Bulging disc   .  Depressive disorder, not elsewhere classified   . Diverticulosis of colon (without mention of hemorrhage)    CT Scan  . Dysrhythmia    PVCs in the past  . Esophageal reflux   . Esophagitis 1998  . Hiatal hernia 1998  . History of kidney stones   . Hypercholesteremia   . Hypertension   . Migraine   . Opioid abuse, in remission [facility])        PAST SURGICAL HISTORY: Past Surgical History:  Procedure Laterality Date  . ABLATION    . BREAST LUMPECTOMY WITH AXILLARY LYMPH NODE BIOPSY Right 08/16/2017   Procedure: RIGHT BREAST LUMPECTOMY WITH AXILLARY SENTINEL LYMPH NODE BIOPSY;  Surgeon: Rolm Bookbinder, MD;  Location: [place];  Service: General;  Laterality: Right;  . CARDIAC CATHETERIZATION  2011   normal coronaries  . KNEE SURGERY    . PORTACATH PLACEMENT Right 09/11/2017   Procedure: INSERTION PORT-A-CATH WITH ULTRASOUND;  Surgeon: Rolm Bookbinder, MD;  Location: [place];  Service: General;  Laterality: Right;  . RE-EXCISION OF BREAST LUMPECTOMY Right 09/11/2017   Procedure: RE-EXCISION OF BREAST LUMPECTOMY;  Surgeon: Rolm Bookbinder, MD;  Location: [place];  Service: General;  Laterality: Right;  . TONSILLECTOMY       FAMILY HISTORY:  Family History  Problem Relation Age of Onset  . Heart disease Father        CABG  . Breast cancer Mother   .  Breast cancer Maternal Grandmother   . Breast cancer Cousin   . Colon cancer Neg Hx   . Stomach cancer Neg Hx      SOCIAL HISTORY:  reports that she quit smoking about 18 months ago. She smoked 0.25 packs per day. She has never used smokeless tobacco. She reports that she drinks alcohol. She reports that she does not use drugs.   ALLERGIES: Amoxicillin-pot clavulanate; Erythromycin; Metoclopramide hcl; and Sulfonamide derivatives   MEDICATIONS:  Current Outpatient Medications  Medication Sig Dispense Refill  . ACETAMINOPHEN-BUTALBITAL 50-325 MG TABS TAKE 1 TABLET BY MOUTH EVERY DAY AS  NEEDED FOR HEADACHE    . ALPRAZolam (XANAX) 0.5 MG tablet Take 0.5 mg by mouth at bedtime as needed for anxiety or sleep.     . Biotin 10000 MCG TABS Take 10,000 mcg by mouth daily.     . bumetanide (BUMEX) 1 MG tablet Take 1 tablet (1 mg total) by mouth 2 (two) times daily. 60 tablet 0  . cloNIDine (CATAPRES) 0.3 MG tablet Take 0.3 mg by mouth at bedtime.     . cyclobenzaprine (FLEXERIL) 10 MG tablet Take 10 mg by mouth every 8 (eight) hours as needed for muscle spasms.  1  . hydrocortisone valerate cream (WESTCORT) 0.2 % Apply 1 application topically daily as needed (eczema).     . hydrOXYzine (ATARAX/VISTARIL) 10 MG tablet Take 10-30 mg by mouth at bedtime as needed for itching.   0  . hyoscyamine (LEVSIN SL) 0.125 MG SL tablet dissolve 1 tablet under the tongue every 4 hours if needed (Patient taking differently: Take 0.125 mg by mouth every 4 (four) hours as needed for cramping. ) 30 tablet 0  . LORazepam (ATIVAN) 0.5 MG tablet TAKE 1 TABLET(0.5 MG) BY MOUTH AT BEDTIME AS NEEDED FOR NAUSEA OR VOMITING (Patient taking differently: Take 0.5 mg by mouth at bedtime. ) 30 tablet 0  . Melatonin 5 MG CAPS Take 5 mg by mouth at bedtime as needed (for sleep).     . Multiple Vitamin (MULITIVITAMIN WITH MINERALS) TABS Take 1 tablet by mouth daily.    . ondansetron (ZOFRAN) 8 MG tablet Take 1 tablet (8 mg total) by mouth 2 (two) times daily as needed. Start on the third day after chemotherapy. 30 tablet 1  . pantoprazole (PROTONIX) 40 MG tablet Take 1 tablet (40 mg total) by mouth 2 (two) times daily before a meal. (Patient taking differently: Take 40 mg by mouth daily. ) 60 tablet 6  . prochlorperazine (COMPAZINE) 10 MG tablet Take 1 tablet (10 mg total) by mouth every 6 (six) hours as needed (Nausea or vomiting). 30 tablet 1  . traZODone (DESYREL) 100 MG tablet Take 100 mg by mouth at bedtime.     . valACYclovir (VALTREX) 1000 MG tablet Take 1,000 mg by mouth 2 (two) times daily as needed (for out  breaks).     . meloxicam (MOBIC) 7.5 MG tablet Take 1 tablet (7.5 mg total) by mouth 2 (two) times daily. (Patient not taking: Reported on 02/26/2018) 60 tablet 2  . spironolactone (ALDACTONE) 25 MG tablet Take 25 mg by mouth daily.     No current facility-administered medications for this encounter.      REVIEW OF SYSTEMS: On review of systems, the patient reports that she is doing well overall. She denies any chest pain, shortness of breath, cough, fevers, chills, night sweats, unintended weight changes. She denies any bowel or bladder disturbances, and denies abdominal pain, nausea or vomiting. She  denies any new musculoskeletal or joint aches or pains. A complete review of systems is obtained and is otherwise negative.     PHYSICAL EXAM:  Wt Readings from Last 3 Encounters:  02/26/18 121 lb 3.2 oz (55 kg)  02/15/18 124 lb (56.2 kg)  01/29/18 126 lb 3.2 oz (57.2 kg)   Temp Readings from Last 3 Encounters:  02/26/18 98.4 F (36.9 C) (Oral)  02/15/18 97.9 F (36.6 C) (Oral)  02/05/18 98 F (36.7 C) (Oral)   BP Readings from Last 3 Encounters:  02/26/18 115/76  02/15/18 (!) 141/74  02/05/18 128/86   Pulse Readings from Last 3 Encounters:  02/26/18 98  02/15/18 89  02/05/18 85     In general this is a well appearing Caucasian female in no acute distress. She is alert and oriented x4 and appropriate throughout the examination. HEENT reveals that the patient is normocephalic, atraumatic. EOMs are intact.  Evaluation of her right lumpectomy site reveals a well-healed inframammary incision.  No evidence of edema along the chest wall or right upper extremity is noted.  Her lower extremity edema is difficult to assess given her small frame, but the patient reports that it is significant compared to typical.   ECOG = 1  0 - Asymptomatic (Fully active, able to carry on all predisease activities without restriction)  1 - Symptomatic but completely ambulatory (Restricted in  physically strenuous activity but ambulatory and able to carry out work of a light or sedentary nature. For example, light housework, office work)  2 - Symptomatic, <50% in bed during the day (Ambulatory and capable of all self care but unable to carry out any work activities. Up and about more than 50% of waking hours)  3 - Symptomatic, >50% in bed, but not bedbound (Capable of only limited self-care, confined to bed or chair 50% or more of waking hours)  4 - Bedbound (Completely disabled. Cannot carry on any self-care. Totally confined to bed or chair)  5 - Death   Eustace Pen MM, Creech RH, Tormey DC, et al. [phone]). "Toxicity and response criteria of the [facility]". [place] Oncol. 5 (6): 649-55    LABORATORY DATA:  Lab Results  Component Value Date   WBC 4.3 02/15/2018   HGB 12.0 02/15/2018   HCT 35.7 (L) 02/15/2018   MCV 96.5 02/15/2018   PLT 260 02/15/2018   Lab Results  Component Value Date   NA 141 02/15/2018   K 4.4 02/15/2018   CL 104 02/15/2018   CO2 27 02/15/2018   Lab Results  Component Value Date   ALT 13 02/15/2018   AST 21 02/15/2018   ALKPHOS 49 02/15/2018   BILITOT 0.3 02/15/2018      RADIOGRAPHY: No results found.     IMPRESSION/PLAN: 1. Stage IA, pT1cN0M0, grade 2, ER/PR positive invasive ductal carcinoma of the right breast with associated DCIS. Dr. Lisbeth Renshaw discusses the pathology findings and reviews the nature of right breast disease.  We discussed the rationale for utilizing radiation and adjuvant setting following lumpectomy and the study supporting the utility.  We discussed the timing of initiating simulation as well as radiation.  We discussed the risks, benefits, short, and long term effects of radiotherapy, and the patient is interested in proceeding. Dr. Lisbeth Renshaw discusses the delivery and logistics of radiotherapy and anticipates a course of 4 or 6-1/2 weeks of radiotherapy.  She is motivated to consider a hypofractionated  course as well.Written consent is obtained and placed in  the chart, a copy was provided to the patient.  She will be contacted to coordinate simulation in the next week or so.  She is encouraged to call if she has questions or concerns prior to her next visit.  Determination of her course will be up to further discussion with Dr. Lisbeth Renshaw. 2. Lower extremity edema.  The patient will be meeting with physical therapy for evaluation of this.  This could be multifactorial with pre-existing hypertension, and the possibility of fluid mobilization issues following her systemic chemotherapy.  We will follow this expectantly and do not anticipate this to impact the ability to tolerate or receive benefit from radiation.  In a visit lasting 60 minutes, greater than 50% of the time was spent face to face discussing her case, and coordinating the patient's care.  The above documentation reflects my direct findings during this shared patient visit. Please see the separate note by Dr. Lisbeth Renshaw on this date for the remainder of the patient's plan of care.    Carola Rhine, PAC

## 2018-02-28 ENCOUNTER — Encounter: Payer: Self-pay | Admitting: General Practice

## 2018-02-28 ENCOUNTER — Ambulatory Visit: Payer: 59 | Attending: Hematology and Oncology | Admitting: Rehabilitation

## 2018-02-28 ENCOUNTER — Other Ambulatory Visit: Payer: Self-pay

## 2018-02-28 DIAGNOSIS — R600 Localized edema: Secondary | ICD-10-CM | POA: Diagnosis present

## 2018-02-28 DIAGNOSIS — Z170 Estrogen receptor positive status [ER+]: Secondary | ICD-10-CM | POA: Insufficient documentation

## 2018-02-28 DIAGNOSIS — M25611 Stiffness of right shoulder, not elsewhere classified: Secondary | ICD-10-CM | POA: Diagnosis present

## 2018-02-28 DIAGNOSIS — C50311 Malignant neoplasm of lower-inner quadrant of right female breast: Secondary | ICD-10-CM | POA: Insufficient documentation

## 2018-02-28 NOTE — Patient Instructions (Signed)
XYW stretch, single arm doorway stretch, alt UE/LE lengthening Walking 3hrs per week Elevation

## 2018-02-28 NOTE — Progress Notes (Signed)
[facility] Psychosocial Distress Screening Clinical Social Work  Clinical Social Work was referred by distress screening protocol.  The patient scored a 6 on the Psychosocial Distress Thermometer which indicates moderate distress. Clinical Social Worker contacted patient by phone to assess for distress and other psychosocial needs. Unable to reach patient by phone, left VM w information on [facility] and CSW contact information, encouraged to contact as needed for help/resources.    ONCBCN DISTRESS SCREENING 02/26/2018  Screening Type Initial Screening  Distress experienced in past week (1-10) 6  Emotional problem type Nervousness/Anxiety;Adjusting to illness;Adjusting to appearance changes  Physical Problem type Pain;Sleep/insomnia;Getting around;Swollen arms/legs  Other Contact by phone     Clinical Social Worker follow up needed: No. patient to contact Linda as needed  If yes, follow up plan:  Beverely Pace, [place], LCSW Clinical Social Worker Phone:  [phone]

## 2018-02-28 NOTE — Therapy (Signed)
[place], [place], [address] Phone: [phone]   Fax:  [phone]  Physical Therapy Evaluation  Patient Details  Name: Jasmine Allen MRN: [id number] Date of Birth: 09-06-1965 Referring Provider (PT): Dr. Lindi Adie   Encounter Date: 02/28/2018  PT End of Session - 02/28/18 1653    Visit Number  1    Number of Visits  1    PT Start Time  6301    PT Stop Time  1616    PT Time Calculation (min)  60 min    Activity Tolerance  Patient tolerated treatment well    Behavior During Therapy  [facility] for tasks assessed/performed       Past Medical History:  Diagnosis Date  . Alopecia, unspecified   . Anemia, unspecified    during her pregnancy  . Anxiety state, unspecified   . Bulging disc   . Depressive disorder, not elsewhere classified   . Diverticulosis of colon (without mention of hemorrhage)    CT Scan  . Dysrhythmia    PVCs in the past  . Esophageal reflux   . Esophagitis 1998  . Hiatal hernia 1998  . History of kidney stones   . Hypercholesteremia   . Hypertension   . Migraine   . Opioid abuse, in remission [facility])     Past Surgical History:  Procedure Laterality Date  . ABLATION    . BREAST LUMPECTOMY WITH AXILLARY LYMPH NODE BIOPSY Right 08/16/2017   Procedure: RIGHT BREAST LUMPECTOMY WITH AXILLARY SENTINEL LYMPH NODE BIOPSY;  Surgeon: Rolm Bookbinder, MD;  Location: [place];  Service: General;  Laterality: Right;  . CARDIAC CATHETERIZATION  2011   normal coronaries  . KNEE SURGERY    . PORTACATH PLACEMENT Right 09/11/2017   Procedure: INSERTION PORT-A-CATH WITH ULTRASOUND;  Surgeon: Rolm Bookbinder, MD;  Location: [place];  Service: General;  Laterality: Right;  . RE-EXCISION OF BREAST LUMPECTOMY Right 09/11/2017   Procedure: RE-EXCISION OF BREAST LUMPECTOMY;  Surgeon: Rolm Bookbinder, MD;  Location: [place];  Service: General;  Laterality: Right;   . TONSILLECTOMY      There were no vitals filed for this visit.   Subjective Assessment - 02/28/18 1520    Subjective  I have bilateral LE swelling     Pertinent History  08/16/2017 Breast lumpectomy with axillary lymph node biopsy 3 nodes removed all negative, 09/11/17 reexcision, chemotherapy has been completed x 3 weeks, will start radiation the first week of Dec.  Simulation tomorrow.   kidney and heart are okay per pt , DVT ruled out, history of fluid overload with other IVs    Limitations  --   nothing; just with fatigue   Patient Stated Goals  get lymphatic massage    Currently in Pain?  Yes    Pain Score  3     Pain Location  Calf    Pain Orientation  Right;Left    Pain Descriptors / Indicators  Aching    Pain Type  Acute pain    Pain Onset  More than a month ago    Pain Frequency  Intermittent    Aggravating Factors   when swelling is present, walking alot     Pain Relieving Factors  rest, elevation, compression         [facility] PT Assessment - 02/28/18 0001      Assessment   Medical Diagnosis  Rt breast cancer    Referring Provider (PT)  Dr.  Gudena    Onset Date/Surgical Date  08/16/17    Hand Dominance  Right    Next MD Visit  tomorrow    Prior Therapy  no      Precautions   Precaution Comments  lymphedema Rt UE, cancer      Restrictions   Weight Bearing Restrictions  No      Balance Screen   Has the patient fallen in the past 6 months  No    Has the patient had a decrease in activity level because of a fear of falling?   No    Is the patient reluctant to leave their home because of a fear of falling?   No      Home Environment   Living Environment  Private residence    Living Arrangements  Spouse/significant other;Children    Available Help at Discharge  Family      Prior Function   Level of Independence  Independent      Cognition   Overall Cognitive Status  Within Functional Limits for tasks assessed      Observation/Other Assessments   Observations   SLNB and inferior breast incisions well healed no signs of lymphedema       Coordination   Gross Motor Movements are Fluid and Coordinated  Yes      Posture/Postural Control   Posture/Postural Control  No significant limitations      ROM / Strength   AROM / PROM / Strength  AROM;PROM;Strength      AROM   Overall AROM Comments  all motions and WNL with some axillary region pulling with overhead flexion and behind the head    AROM Assessment Site  Shoulder    Right/Left Shoulder  Right;Left        LYMPHEDEMA/ONCOLOGY QUESTIONNAIRE - 02/28/18 1540      Type   Cancer Type  Rt breast cancer      Surgeries   Lumpectomy Date  08/13/17    Sentinel Lymph Node Biopsy Date  08/13/17    Other Surgery Date  09/11/17   re-excision   Number Lymph Nodes Removed  3      Date Lymphedema/Swelling Started   Date  01/08/18   noticed fluid all over body from chemo     Treatment   Active Chemotherapy Treatment  No    Past Chemotherapy Treatment  Yes    Active Radiation Treatment  No    Past Radiation Treatment  No    Current Hormone Treatment  No    Past Hormone Therapy  No      What other symptoms do you have   Are you Having Heaviness or Tightness  Yes    Are you having Pain  Yes      Lymphedema Assessments   Lymphedema Assessments  Lower extremities      Right Lower Extremity Lymphedema   30 cm Proximal to Floor at Lateral Plantar Foot  34.2 cm    20 cm Proximal to Floor at Lateral Plantar Foot  31.5 1    10  cm Proximal to Floor at Lateral Malleoli  22.3 cm    Circumference of ankle/heel  26.2 cm.    5 cm Proximal to 1st MTP Joint  18.4 cm    Across MTP Joint  17.7 cm    Around Proximal Great Toe  6.3 cm      Left Lower Extremity Lymphedema   30 cm Proximal to Floor at Lateral Plantar Foot  33.9 cm  20 cm Proximal to Floor at Lateral Plantar Foot  29.8 cm    10 cm Proximal to Floor at Lateral Malleoli  23.2 cm    Circumference of ankle/heel  26 cm.    5 cm Proximal to  1st MTP Joint  18.5 cm    Across MTP Joint  18.6 cm    Around Proximal Great Toe  6.4 cm             Objective measurements completed on examination: See above findings.      [facility] Adult PT Treatment/Exercise - 02/28/18 0001      Exercises   Exercises  Other Exercises    Other Exercises   pt given turning point part 2 handout with [facility], overhead flexion, single arm doorway stretch noted for home, encouraged to walk x 3 hrs per week, given info on livestrong      Manual Therapy   Manual Therapy  Edema management    Edema Management  discussed edema options.  Pt agreeable to bil knee high garments and elevation and exercise only for now              PT Education - 02/28/18 1652    Education Details  HEP, reasons for edema , lymphedema reduction for UE    Person(s) Educated  Patient    Methods  Explanation;Handout    Comprehension  Verbalized understanding          PT Long Term Goals - 02/28/18 1657      PT LONG TERM GOAL #1   Title  Pt will be knowledgeable about lymphedema risk reduction for the UE    Time  1    Period  Days    Status  Achieved      PT LONG TERM GOAL #2   Title  Pt will be ind with HEP for Rt shoulder ROM     Time  1    Period  Days    Status  Achieved      PT LONG TERM GOAL #3   Title  Pt will be ind with self edema management     Time  1    Period  Days    Status  Achieved             Plan - 02/28/18 1653    Clinical Impression Statement  Ethne presents today with complaints of improving bil LE edema due to chemotherapy infusions.  She reported all over body edema that has pretty much resolved except for mildly in the feet and ankles.  pt will no signs of lymphedema present just fluid retention from chemo infusions now improving.  pt will start to walk/exercise, use elevation, and PT will assist in getting a pair of knee high compression garments.  Her breast and shoulder look good with excllent ROM.  She was given some  stretches to improve end range pulling and to work through radiation      History and Personal Factors relevant to plan of care:  08/16/2017 Breast lumpectomy with axillary lymph node biopsy 3 nodes removed all negative, 09/11/17 reexcision, chemotherapy has been completed x 3 weeks, will start radiation the first week of Dec. Simulation tomorrow. kidney and heart are okay per pt , DVT ruled out, history of fluid overload with other IVs    Clinical Presentation  Evolving    Clinical Presentation due to:  changing edema status     Clinical Decision Making  Moderate    PT  Frequency  One time visit    PT Treatment/Interventions  ADLs/Self Care Home Management;Therapeutic exercise    PT Next Visit Plan  if pt returns reassess LE status or other complaints     Consulted and Agree with Plan of Care  Patient       Patient will benefit from skilled therapeutic intervention in order to improve the following deficits and impairments:  Decreased safety awareness, Increased edema  Visit Diagnosis: Malignant neoplasm of lower-inner quadrant of right breast of female, estrogen receptor positive ([facility])  Localized edema  Stiffness of right shoulder, not elsewhere classified     Problem List Patient Active Problem List   Diagnosis Date Noted  . Port-A-Cath in place 10/23/2017  . Malignant neoplasm of lower-inner quadrant of right breast of female, estrogen receptor positive ([place]) 08/23/2017  . Chest pain 04/07/2014  . Smoking 04/07/2014  . ANEMIA, IRON DEFICIENCY 10/20/2009  . DEPRESSION 06/15/2009  . GERD 06/15/2009  . IRRITABLE BOWEL SYNDROME 06/15/2009  . RECTAL BLEEDING 06/15/2009    Shan Levans, PT 02/28/2018, 4:58 PM  [place], [place], [address] Phone: [phone]   Fax:  [phone]  Name: DAIONNA CROSSLAND MRN: [id number] Date of Birth: [birth year]

## 2018-03-01 ENCOUNTER — Ambulatory Visit
Admission: RE | Admit: 2018-03-01 | Discharge: 2018-03-01 | Disposition: A | Discharge status: Home / Self Care | Payer: 59 | Source: Ambulatory Visit | Attending: Radiation Oncology | Admitting: Radiation Oncology

## 2018-03-01 DIAGNOSIS — Z170 Estrogen receptor positive status [ER+]: Secondary | ICD-10-CM | POA: Insufficient documentation

## 2018-03-01 DIAGNOSIS — C50311 Malignant neoplasm of lower-inner quadrant of right female breast: Secondary | ICD-10-CM | POA: Insufficient documentation

## 2018-03-02 NOTE — Progress Notes (Signed)
  Radiation Oncology         [phone] ________________________________  Name: Jasmine Allen MRN: [id number]  Date: 03/01/2018  DOB: [birth year]  Optical Surface Tracking Plan:  Since intensity modulated radiotherapy (IMRT) and 3D conformal radiation treatment methods are predicated on accurate and precise positioning for treatment, intrafraction motion monitoring is medically necessary to ensure accurate and safe treatment delivery.  The ability to quantify intrafraction motion without excessive ionizing radiation dose can only be performed with optical surface tracking. Accordingly, surface imaging offers the opportunity to obtain 3D measurements of patient position throughout IMRT and 3D treatments without excessive radiation exposure.  I am ordering optical surface tracking for this patient's upcoming course of radiotherapy. ________________________________  Kyung Rudd, MD 03/02/2018 10:24 PM    Reference:   Ursula Alert, J, et al. Surface imaging-based analysis of intrafraction motion for breast radiotherapy patients.Journal of [place], n. 6, nov. 2014. ISSN [id number].   Available at: <http://www.jacmp.org/index.php/jacmp/article/view/4957>.

## 2018-03-02 NOTE — Progress Notes (Signed)
  Radiation Oncology         [phone] ________________________________  Name: KEYATTA TOLLES MRN: [id number]  Date: 03/01/2018  DOB: 14-Jun-1965  DIAGNOSIS:     ICD-10-CM   1. Malignant neoplasm of lower-inner quadrant of right breast of female, estrogen receptor positive ([facility]) C50.311    Z17.0      SIMULATION AND TREATMENT PLANNING NOTE  The patient presented for simulation prior to beginning her course of radiation treatment for her diagnosis of right-sided breast cancer. The patient was placed in a supine position on a breast board. A customized vac-lock bag was constructed and this complex treatment device will be used on a daily basis during her treatment. In this fashion, a CT scan was obtained through the chest area and an isocenter was placed near the chest wall within the breast.  The patient will be planned to receive a course of radiation initially to a dose of 50.4 Gy. This will consist of a whole breast radiotherapy technique. To accomplish this, 2 customized blocks have been designed which will correspond to medial and lateral whole breast tangent fields. This treatment will be accomplished at 1.8 Gy per fraction. A forward planning technique will also be evaluated to determine if this approach improves the plan. It is anticipated that the patient will then receive a 10 Gy boost to the seroma cavity which has been contoured. This will be accomplished at 2 Gy per fraction.   This initial treatment will consist of a 3-D conformal technique. The seroma has been contoured as the primary target structure. Additionally, dose volume histograms of both this target as well as the lungs and heart will also be evaluated. Such an approach is necessary to ensure that the target area is adequately covered while the nearby critical  normal structures are adequately spared.  Plan:  The final anticipated total dose therefore will correspond to 60.4  Gy.    _______________________________   Jodelle Gross, MD, PhD

## 2018-03-04 DIAGNOSIS — C50311 Malignant neoplasm of lower-inner quadrant of right female breast: Secondary | ICD-10-CM | POA: Diagnosis not present

## 2018-03-05 ENCOUNTER — Telehealth: Payer: Self-pay | Admitting: Hematology and Oncology

## 2018-03-05 NOTE — Telephone Encounter (Signed)
Scheduled appt per 11/26 sch message - sent reminder letter in the mail

## 2018-03-08 ENCOUNTER — Other Ambulatory Visit: Payer: Self-pay | Admitting: *Deleted

## 2018-03-08 ENCOUNTER — Telehealth: Payer: Self-pay | Admitting: *Deleted

## 2018-03-08 ENCOUNTER — Telehealth: Payer: Self-pay | Admitting: Radiation Oncology

## 2018-03-08 DIAGNOSIS — M7989 Other specified soft tissue disorders: Secondary | ICD-10-CM

## 2018-03-08 NOTE — Telephone Encounter (Signed)
Received TC from patient requesting f/u lab appt for Monday, 03/11/18. She has been taking Bumex 1 mg tabs BID for lower extremity swelling and wants to check her lab work.  She tried going to 1 tab daily but noted return of swelling in her feet and ankles. So she went back to 2 tabs daily. High priority scheduling message sent and orders placed for labs. Pt starts XRT on Monday and wanted  Labs done either before or after her 3pm radiation appt.

## 2018-03-08 NOTE — Telephone Encounter (Signed)
Called regarding 12/2 °

## 2018-03-11 ENCOUNTER — Ambulatory Visit
Admission: RE | Admit: 2018-03-11 | Discharge: 2018-03-11 | Disposition: A | Discharge status: Home / Self Care | Payer: 59 | Source: Ambulatory Visit | Attending: Radiation Oncology | Admitting: Radiation Oncology

## 2018-03-11 ENCOUNTER — Inpatient Hospital Stay: Payer: 59

## 2018-03-11 DIAGNOSIS — Z170 Estrogen receptor positive status [ER+]: Secondary | ICD-10-CM | POA: Insufficient documentation

## 2018-03-11 DIAGNOSIS — C50311 Malignant neoplasm of lower-inner quadrant of right female breast: Secondary | ICD-10-CM | POA: Diagnosis not present

## 2018-03-11 DIAGNOSIS — M7989 Other specified soft tissue disorders: Secondary | ICD-10-CM

## 2018-03-11 LAB — CBC WITH DIFFERENTIAL (CANCER CENTER ONLY)
Abs Immature Granulocytes: 0.01 10*3/uL (ref 0.00–0.07)
Basophils Absolute: 0 10*3/uL (ref 0.0–0.1)
Basophils Relative: 1 %
Eosinophils Absolute: 0.1 10*3/uL (ref 0.0–0.5)
Eosinophils Relative: 1 %
HCT: 42.2 % (ref 36.0–46.0)
Hemoglobin: 14.2 g/dL (ref 12.0–15.0)
Immature Granulocytes: 0 %
Lymphocytes Relative: 26 %
Lymphs Abs: 1.2 10*3/uL (ref 0.7–4.0)
MCH: 31 pg (ref 26.0–34.0)
MCHC: 33.6 g/dL (ref 30.0–36.0)
MCV: 92.1 fL (ref 80.0–100.0)
Monocytes Absolute: 0.4 10*3/uL (ref 0.1–1.0)
Monocytes Relative: 9 %
Neutro Abs: 2.9 10*3/uL (ref 1.7–7.7)
Neutrophils Relative %: 63 %
Platelet Count: 292 10*3/uL (ref 150–400)
RBC: 4.58 MIL/uL (ref 3.87–5.11)
RDW: 12.9 % (ref 11.5–15.5)
WBC Count: 4.6 10*3/uL (ref 4.0–10.5)
nRBC: 0 % (ref 0.0–0.2)

## 2018-03-11 LAB — CMP (CANCER CENTER ONLY)
ALT: 12 U/L (ref 0–44)
AST: 24 U/L (ref 15–41)
Albumin: 4.4 g/dL (ref 3.5–5.0)
Alkaline Phosphatase: 57 U/L (ref 38–126)
Anion gap: 12 (ref 5–15)
BUN: 12 mg/dL (ref 6–20)
CO2: 29 mmol/L (ref 22–32)
Calcium: 9.8 mg/dL (ref 8.9–10.3)
Chloride: 100 mmol/L (ref 98–111)
Creatinine: 0.82 mg/dL (ref 0.44–1.00)
GFR, Est AFR Am: >60 mL/min (ref >60)
GFR, Estimated: >60 mL/min (ref >60)
Glucose, Bld: 108 mg/dL — ABNORMAL HIGH (ref 70–99)
Potassium: 4.1 mmol/L (ref 3.5–5.1)
Sodium: 141 mmol/L (ref 135–145)
Total Bilirubin: 0.4 mg/dL (ref 0.3–1.2)
Total Protein: 7.6 g/dL (ref 6.5–8.1)

## 2018-03-11 MED ORDER — RADIAPLEXRX EX GEL
Freq: Once | CUTANEOUS | Status: AC
  Administered 2018-03-11: 16:00:00 via TOPICAL

## 2018-03-11 MED ORDER — ALRA NON-METALLIC DEODORANT (RAD-ONC)
1.0000 "application " | Freq: Once | TOPICAL | Status: AC
  Administered 2018-03-11: 1 via TOPICAL

## 2018-03-11 NOTE — Progress Notes (Signed)
Pt here for patient teaching.  Pt given Radiation and You booklet, skin care instructions, Alra deodorant and Radiaplex gel.  Reviewed areas of pertinence such as fatigue, hair loss, skin changes, breast tenderness and breast swelling . Pt able to give teach back of to pat skin and use unscented/gentle soap,apply Radiaplex bid, avoid applying anything to skin within 4 hours of treatment, avoid wearing an under wire bra and to use an electric razor if they must shave. Pt demonstrated understanding and verbalizes understanding of information given and will contact nursing with any questions or concerns.  Gloriajean Dell. Leonie Green, BSN

## 2018-03-12 ENCOUNTER — Ambulatory Visit
Admission: RE | Admit: 2018-03-12 | Discharge: 2018-03-12 | Disposition: A | Discharge status: Home / Self Care | Payer: 59 | Source: Ambulatory Visit | Attending: Radiation Oncology | Admitting: Radiation Oncology

## 2018-03-12 DIAGNOSIS — C50311 Malignant neoplasm of lower-inner quadrant of right female breast: Secondary | ICD-10-CM | POA: Diagnosis not present

## 2018-03-13 ENCOUNTER — Ambulatory Visit
Admission: RE | Admit: 2018-03-13 | Discharge: 2018-03-13 | Disposition: A | Discharge status: Home / Self Care | Payer: 59 | Source: Ambulatory Visit | Attending: Radiation Oncology | Admitting: Radiation Oncology

## 2018-03-13 DIAGNOSIS — C50311 Malignant neoplasm of lower-inner quadrant of right female breast: Secondary | ICD-10-CM | POA: Diagnosis not present

## 2018-03-14 ENCOUNTER — Ambulatory Visit
Admission: RE | Admit: 2018-03-14 | Discharge: 2018-03-14 | Disposition: A | Discharge status: Home / Self Care | Payer: 59 | Source: Ambulatory Visit | Attending: Radiation Oncology | Admitting: Radiation Oncology

## 2018-03-14 DIAGNOSIS — C50311 Malignant neoplasm of lower-inner quadrant of right female breast: Secondary | ICD-10-CM | POA: Diagnosis not present

## 2018-03-15 ENCOUNTER — Other Ambulatory Visit: Payer: Self-pay | Admitting: Hematology and Oncology

## 2018-03-15 ENCOUNTER — Ambulatory Visit
Admission: RE | Admit: 2018-03-15 | Discharge: 2018-03-15 | Disposition: A | Discharge status: Home / Self Care | Payer: 59 | Source: Ambulatory Visit | Attending: Radiation Oncology | Admitting: Radiation Oncology

## 2018-03-15 DIAGNOSIS — C50311 Malignant neoplasm of lower-inner quadrant of right female breast: Secondary | ICD-10-CM

## 2018-03-15 DIAGNOSIS — Z170 Estrogen receptor positive status [ER+]: Principal | ICD-10-CM

## 2018-03-18 ENCOUNTER — Ambulatory Visit
Admission: RE | Admit: 2018-03-18 | Discharge: 2018-03-18 | Disposition: A | Discharge status: Home / Self Care | Payer: 59 | Source: Ambulatory Visit | Attending: Radiation Oncology | Admitting: Radiation Oncology

## 2018-03-18 DIAGNOSIS — C50311 Malignant neoplasm of lower-inner quadrant of right female breast: Secondary | ICD-10-CM | POA: Diagnosis not present

## 2018-03-19 ENCOUNTER — Ambulatory Visit
Admission: RE | Admit: 2018-03-19 | Discharge: 2018-03-19 | Disposition: A | Discharge status: Home / Self Care | Payer: 59 | Source: Ambulatory Visit | Attending: Radiation Oncology | Admitting: Radiation Oncology

## 2018-03-19 DIAGNOSIS — C50311 Malignant neoplasm of lower-inner quadrant of right female breast: Secondary | ICD-10-CM | POA: Diagnosis not present

## 2018-03-20 ENCOUNTER — Ambulatory Visit
Admission: RE | Admit: 2018-03-20 | Discharge: 2018-03-20 | Disposition: A | Discharge status: Home / Self Care | Payer: 59 | Source: Ambulatory Visit | Attending: Radiation Oncology | Admitting: Radiation Oncology

## 2018-03-20 DIAGNOSIS — C50311 Malignant neoplasm of lower-inner quadrant of right female breast: Secondary | ICD-10-CM | POA: Diagnosis not present

## 2018-03-21 ENCOUNTER — Ambulatory Visit
Admission: RE | Admit: 2018-03-21 | Discharge: 2018-03-21 | Disposition: A | Discharge status: Home / Self Care | Payer: 59 | Source: Ambulatory Visit | Attending: Radiation Oncology | Admitting: Radiation Oncology

## 2018-03-21 DIAGNOSIS — C50311 Malignant neoplasm of lower-inner quadrant of right female breast: Secondary | ICD-10-CM | POA: Diagnosis not present

## 2018-03-22 ENCOUNTER — Ambulatory Visit
Admission: RE | Admit: 2018-03-22 | Discharge: 2018-03-22 | Disposition: A | Discharge status: Home / Self Care | Payer: 59 | Source: Ambulatory Visit | Attending: Radiation Oncology | Admitting: Radiation Oncology

## 2018-03-22 DIAGNOSIS — C50311 Malignant neoplasm of lower-inner quadrant of right female breast: Secondary | ICD-10-CM | POA: Diagnosis not present

## 2018-03-25 ENCOUNTER — Ambulatory Visit
Admission: RE | Admit: 2018-03-25 | Discharge: 2018-03-25 | Disposition: A | Discharge status: Home / Self Care | Payer: 59 | Source: Ambulatory Visit | Attending: Radiation Oncology | Admitting: Radiation Oncology

## 2018-03-25 DIAGNOSIS — C50311 Malignant neoplasm of lower-inner quadrant of right female breast: Secondary | ICD-10-CM | POA: Diagnosis not present

## 2018-03-26 ENCOUNTER — Ambulatory Visit
Admission: RE | Admit: 2018-03-26 | Discharge: 2018-03-26 | Disposition: A | Discharge status: Home / Self Care | Payer: 59 | Source: Ambulatory Visit | Attending: Radiation Oncology | Admitting: Radiation Oncology

## 2018-03-26 DIAGNOSIS — C50311 Malignant neoplasm of lower-inner quadrant of right female breast: Secondary | ICD-10-CM | POA: Diagnosis not present

## 2018-03-27 ENCOUNTER — Ambulatory Visit
Admission: RE | Admit: 2018-03-27 | Discharge: 2018-03-27 | Disposition: A | Discharge status: Home / Self Care | Payer: 59 | Source: Ambulatory Visit | Attending: Radiation Oncology | Admitting: Radiation Oncology

## 2018-03-27 ENCOUNTER — Other Ambulatory Visit: Payer: Self-pay | Admitting: Hematology and Oncology

## 2018-03-27 ENCOUNTER — Telehealth: Payer: Self-pay | Admitting: *Deleted

## 2018-03-27 DIAGNOSIS — C50311 Malignant neoplasm of lower-inner quadrant of right female breast: Secondary | ICD-10-CM | POA: Diagnosis not present

## 2018-03-27 NOTE — Telephone Encounter (Signed)
Jasmine Allen [phone]) called "requesting refill for BUMEX 1mg  tablet.  Requested from [facility] earlier today.  I have two pills left."

## 2018-03-28 ENCOUNTER — Ambulatory Visit
Admission: RE | Admit: 2018-03-28 | Discharge: 2018-03-28 | Disposition: A | Discharge status: Home / Self Care | Payer: 59 | Source: Ambulatory Visit | Attending: Radiation Oncology | Admitting: Radiation Oncology

## 2018-03-28 DIAGNOSIS — C50311 Malignant neoplasm of lower-inner quadrant of right female breast: Secondary | ICD-10-CM | POA: Diagnosis not present

## 2018-03-29 ENCOUNTER — Ambulatory Visit
Admission: RE | Admit: 2018-03-29 | Discharge: 2018-03-29 | Disposition: A | Discharge status: Home / Self Care | Payer: 59 | Source: Ambulatory Visit | Attending: Radiation Oncology | Admitting: Radiation Oncology

## 2018-03-29 DIAGNOSIS — C50311 Malignant neoplasm of lower-inner quadrant of right female breast: Secondary | ICD-10-CM

## 2018-03-29 DIAGNOSIS — Z170 Estrogen receptor positive status [ER+]: Principal | ICD-10-CM

## 2018-03-29 MED ORDER — RADIAPLEXRX EX GEL
Freq: Once | CUTANEOUS | Status: AC
  Administered 2018-03-29: 18:00:00 via TOPICAL

## 2018-04-01 ENCOUNTER — Ambulatory Visit
Admission: RE | Admit: 2018-04-01 | Discharge: 2018-04-01 | Disposition: A | Discharge status: Home / Self Care | Payer: 59 | Source: Ambulatory Visit | Attending: Radiation Oncology | Admitting: Radiation Oncology

## 2018-04-01 DIAGNOSIS — C50311 Malignant neoplasm of lower-inner quadrant of right female breast: Secondary | ICD-10-CM | POA: Diagnosis not present

## 2018-04-02 ENCOUNTER — Ambulatory Visit
Admission: RE | Admit: 2018-04-02 | Discharge: 2018-04-02 | Disposition: A | Discharge status: Home / Self Care | Payer: 59 | Source: Ambulatory Visit | Attending: Radiation Oncology | Admitting: Radiation Oncology

## 2018-04-02 DIAGNOSIS — C50311 Malignant neoplasm of lower-inner quadrant of right female breast: Secondary | ICD-10-CM | POA: Diagnosis not present

## 2018-04-04 ENCOUNTER — Ambulatory Visit
Admission: RE | Admit: 2018-04-04 | Discharge: 2018-04-04 | Disposition: A | Discharge status: Home / Self Care | Payer: 59 | Source: Ambulatory Visit | Attending: Radiation Oncology | Admitting: Radiation Oncology

## 2018-04-04 DIAGNOSIS — C50311 Malignant neoplasm of lower-inner quadrant of right female breast: Secondary | ICD-10-CM | POA: Diagnosis not present

## 2018-04-05 ENCOUNTER — Ambulatory Visit
Admission: RE | Admit: 2018-04-05 | Discharge: 2018-04-05 | Disposition: A | Discharge status: Home / Self Care | Payer: 59 | Source: Ambulatory Visit | Attending: Radiation Oncology | Admitting: Radiation Oncology

## 2018-04-05 DIAGNOSIS — C50311 Malignant neoplasm of lower-inner quadrant of right female breast: Secondary | ICD-10-CM | POA: Diagnosis not present

## 2018-04-08 ENCOUNTER — Ambulatory Visit
Admission: RE | Admit: 2018-04-08 | Discharge: 2018-04-08 | Disposition: A | Discharge status: Home / Self Care | Payer: 59 | Source: Ambulatory Visit | Attending: Radiation Oncology | Admitting: Radiation Oncology

## 2018-04-08 DIAGNOSIS — C50311 Malignant neoplasm of lower-inner quadrant of right female breast: Secondary | ICD-10-CM | POA: Diagnosis not present

## 2018-04-09 ENCOUNTER — Ambulatory Visit
Admission: RE | Admit: 2018-04-09 | Discharge: 2018-04-09 | Disposition: A | Discharge status: Home / Self Care | Payer: 59 | Source: Ambulatory Visit | Attending: Radiation Oncology | Admitting: Radiation Oncology

## 2018-04-09 DIAGNOSIS — C50311 Malignant neoplasm of lower-inner quadrant of right female breast: Secondary | ICD-10-CM | POA: Diagnosis not present

## 2018-04-11 ENCOUNTER — Ambulatory Visit
Admission: RE | Admit: 2018-04-11 | Discharge: 2018-04-11 | Disposition: A | Discharge status: Home / Self Care | Payer: 59 | Source: Ambulatory Visit | Attending: Radiation Oncology | Admitting: Radiation Oncology

## 2018-04-11 DIAGNOSIS — Z170 Estrogen receptor positive status [ER+]: Secondary | ICD-10-CM

## 2018-04-11 DIAGNOSIS — C50311 Malignant neoplasm of lower-inner quadrant of right female breast: Secondary | ICD-10-CM

## 2018-04-11 DIAGNOSIS — N951 Menopausal and female climacteric states: Secondary | ICD-10-CM | POA: Diagnosis not present

## 2018-04-12 ENCOUNTER — Ambulatory Visit
Admission: RE | Admit: 2018-04-12 | Discharge: 2018-04-12 | Disposition: A | Discharge status: Home / Self Care | Payer: 59 | Source: Ambulatory Visit | Attending: Radiation Oncology | Admitting: Radiation Oncology

## 2018-04-12 ENCOUNTER — Ambulatory Visit: Payer: 59 | Admitting: Radiation Oncology

## 2018-04-12 DIAGNOSIS — C50311 Malignant neoplasm of lower-inner quadrant of right female breast: Secondary | ICD-10-CM | POA: Diagnosis not present

## 2018-04-15 ENCOUNTER — Telehealth: Payer: Self-pay | Admitting: *Deleted

## 2018-04-15 ENCOUNTER — Other Ambulatory Visit: Payer: Self-pay | Admitting: Hematology and Oncology

## 2018-04-15 ENCOUNTER — Ambulatory Visit
Admission: RE | Admit: 2018-04-15 | Discharge: 2018-04-15 | Disposition: A | Discharge status: Home / Self Care | Payer: 59 | Source: Ambulatory Visit | Attending: Radiation Oncology | Admitting: Radiation Oncology

## 2018-04-15 DIAGNOSIS — Z170 Estrogen receptor positive status [ER+]: Principal | ICD-10-CM

## 2018-04-15 DIAGNOSIS — C50311 Malignant neoplasm of lower-inner quadrant of right female breast: Secondary | ICD-10-CM

## 2018-04-15 NOTE — Telephone Encounter (Signed)
"  Celso Amy [phone]).  Have you all received my lorazepam refill request from my pharmacy?  They say it was sent but sometimes it is not sent."

## 2018-04-16 ENCOUNTER — Ambulatory Visit
Admission: RE | Admit: 2018-04-16 | Discharge: 2018-04-16 | Disposition: A | Discharge status: Home / Self Care | Payer: 59 | Source: Ambulatory Visit | Attending: Radiation Oncology | Admitting: Radiation Oncology

## 2018-04-16 DIAGNOSIS — C50311 Malignant neoplasm of lower-inner quadrant of right female breast: Secondary | ICD-10-CM | POA: Diagnosis not present

## 2018-04-17 ENCOUNTER — Ambulatory Visit
Admission: RE | Admit: 2018-04-17 | Discharge: 2018-04-17 | Disposition: A | Discharge status: Home / Self Care | Payer: 59 | Source: Ambulatory Visit | Attending: Radiation Oncology | Admitting: Radiation Oncology

## 2018-04-17 DIAGNOSIS — C50311 Malignant neoplasm of lower-inner quadrant of right female breast: Secondary | ICD-10-CM | POA: Diagnosis not present

## 2018-04-18 ENCOUNTER — Ambulatory Visit
Admission: RE | Admit: 2018-04-18 | Discharge: 2018-04-18 | Disposition: A | Discharge status: Home / Self Care | Payer: 59 | Source: Ambulatory Visit | Attending: Radiation Oncology | Admitting: Radiation Oncology

## 2018-04-18 DIAGNOSIS — C50311 Malignant neoplasm of lower-inner quadrant of right female breast: Secondary | ICD-10-CM | POA: Diagnosis not present

## 2018-04-19 ENCOUNTER — Ambulatory Visit
Admission: RE | Admit: 2018-04-19 | Discharge: 2018-04-19 | Disposition: A | Discharge status: Home / Self Care | Payer: 59 | Source: Ambulatory Visit | Attending: Radiation Oncology | Admitting: Radiation Oncology

## 2018-04-19 DIAGNOSIS — C50311 Malignant neoplasm of lower-inner quadrant of right female breast: Secondary | ICD-10-CM | POA: Diagnosis not present

## 2018-04-22 ENCOUNTER — Ambulatory Visit
Admission: RE | Admit: 2018-04-22 | Discharge: 2018-04-22 | Disposition: A | Discharge status: Home / Self Care | Payer: 59 | Source: Ambulatory Visit | Attending: Radiation Oncology | Admitting: Radiation Oncology

## 2018-04-22 DIAGNOSIS — C50311 Malignant neoplasm of lower-inner quadrant of right female breast: Secondary | ICD-10-CM | POA: Diagnosis not present

## 2018-04-23 ENCOUNTER — Ambulatory Visit
Admission: RE | Admit: 2018-04-23 | Discharge: 2018-04-23 | Disposition: A | Discharge status: Home / Self Care | Payer: 59 | Source: Ambulatory Visit | Attending: Radiation Oncology | Admitting: Radiation Oncology

## 2018-04-23 DIAGNOSIS — C50311 Malignant neoplasm of lower-inner quadrant of right female breast: Secondary | ICD-10-CM | POA: Diagnosis not present

## 2018-04-24 ENCOUNTER — Ambulatory Visit
Admission: RE | Admit: 2018-04-24 | Discharge: 2018-04-24 | Disposition: A | Discharge status: Home / Self Care | Payer: 59 | Source: Ambulatory Visit | Attending: Radiation Oncology | Admitting: Radiation Oncology

## 2018-04-24 DIAGNOSIS — C50311 Malignant neoplasm of lower-inner quadrant of right female breast: Secondary | ICD-10-CM | POA: Diagnosis not present

## 2018-04-24 NOTE — Progress Notes (Signed)
Patient Care Team: Aletha Halim., PA-C as PCP - General (Family Medicine)  DIAGNOSIS:    ICD-10-CM   1. Malignant neoplasm of lower-inner quadrant of right breast of female, estrogen receptor positive ([place]) C50.311    Z17.0     SUMMARY OF ONCOLOGIC HISTORY:   Malignant neoplasm of lower-inner quadrant of right breast of female, estrogen receptor positive ([place])   08/16/2017 Initial Diagnosis    Right lumpectomy: Grade 2 IDC 1.5 cm, with DCIS and necrosis, 0/3 lymph nodes negative, ER 95%, PR 30%, HER-2 negative ratio 1.14, Ki-67 40%, T1CN0 stage Ia; resection of the margin 09/11/2017: Benign    08/16/2017 Oncotype testing    Oncotype DX recurrence score 31: 19% risk of recurrence at 9 years.  High risk    09/25/2017 - 02/05/2018 Chemotherapy    Adjuvant chemotherapy with dose dense Adriamycin and Cytoxan x4 followed by Taxol weekly x12     03/11/2018 - 04/25/2018 Radiation Therapy    Adjuvant XRT     CHIEF COMPLIANT: Follow-up of radiation therapy to discuss anti-estrogen therapy  INTERVAL HISTORY: Jasmine Allen is a 53 y.o. with above-mentioned history of right breast cancer treated with lumpectomy and adjuvant chemotherapy and is currently on radiation therapy which will end tomorrow. She presents to the clinic today alone and has mild redness and soreness at the radiation site. She is hesitant to start anti-estrogen therapy because of hot flashes but says she will give it a try. She reports joint aches and pains that began 3 weeks into radiation. She has trouble sleeping. She is on Bumex for swelling in her feet and ankles. She has been in menopause since age 6 and was on hormone replacement therapy for 10 years. She is currently on clonidine for menopausal hot flashes and would like to be weaned off of it. She has a small lump at her port site. 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   REVIEW OF  SYSTEMS:   Constitutional: Denies fevers, chills or abnormal weight loss (+) difficulty sleeping (+) hot flashes Eyes: Denies blurriness of vision Ears, nose, mouth, throat, and face: Denies mucositis or sore throat Respiratory: Denies cough, dyspnea or wheezes Cardiovascular: Denies palpitation, chest discomfort Gastrointestinal:  Denies nausea, heartburn or change in bowel habits Skin: Denies abnormal skin rashes MSK: (+) joint aches and pains Lymphatics: Denies new lymphadenopathy or easy bruising Neurological: Denies numbness, tingling or new weaknesses Behavioral/Psych: Mood is stable, no new changes  Extremities: (+) edema of ankles and feet Breast: denies any nodules in either breasts (+) redness and soreness at radiation site (+) lump at port site All other systems were reviewed with the patient and are negative.  I have reviewed the past medical history, past surgical history, social history and family history with the patient and they are unchanged from previous note.  ALLERGIES:  is allergic to amoxicillin-pot clavulanate; erythromycin; metoclopramide hcl; and sulfonamide derivatives.  MEDICATIONS:  Current Outpatient Medications  Medication Sig Dispense Refill  . ACETAMINOPHEN-BUTALBITAL 50-325 MG TABS TAKE 1 TABLET BY MOUTH EVERY DAY AS NEEDED FOR HEADACHE    . ALPRAZolam (XANAX) 0.5 MG tablet Take 0.5 mg by mouth at bedtime as needed for anxiety or sleep.     Marland Kitchen anastrozole (ARIMIDEX) 1 MG tablet Take 1 tablet (1 mg total) by mouth daily. 90 tablet 3  . Biotin 10000 MCG TABS Take 10,000 mcg by mouth  daily.     . bumetanide (BUMEX) 1 MG tablet TAKE 1 TABLET(1 MG) BY MOUTH TWICE DAILY 60 tablet 0  . cloNIDine (CATAPRES) 0.3 MG tablet Take 0.3 mg by mouth at bedtime.     . cyclobenzaprine (FLEXERIL) 10 MG tablet Take 10 mg by mouth every 8 (eight) hours as needed for muscle spasms.  1  . hydrocortisone valerate cream (WESTCORT) 0.2 % Apply 1 application topically daily as  needed (eczema).     . hydrOXYzine (ATARAX/VISTARIL) 10 MG tablet Take 10-30 mg by mouth at bedtime as needed for itching.   0  . hyoscyamine (LEVSIN SL) 0.125 MG SL tablet dissolve 1 tablet under the tongue every 4 hours if needed (Patient taking differently: Take 0.125 mg by mouth every 4 (four) hours as needed for cramping. ) 30 tablet 0  . LORazepam (ATIVAN) 0.5 MG tablet TAKE 1 TABLET BY MOUTH AT BEDTIME AS NEEDED FOR NAUSEA 30 tablet 0  . Melatonin 5 MG CAPS Take 5 mg by mouth at bedtime as needed (for sleep).     . meloxicam (MOBIC) 7.5 MG tablet Take 1 tablet (7.5 mg total) by mouth 2 (two) times daily. 60 tablet 2  . Multiple Vitamin (MULITIVITAMIN WITH MINERALS) TABS Take 1 tablet by mouth daily.    . ondansetron (ZOFRAN) 8 MG tablet Take 1 tablet (8 mg total) by mouth 2 (two) times daily as needed. Start on the third day after chemotherapy. 30 tablet 1  . pantoprazole (PROTONIX) 40 MG tablet Take 1 tablet (40 mg total) by mouth 2 (two) times daily before a meal. (Patient taking differently: Take 40 mg by mouth daily. ) 60 tablet 6  . prochlorperazine (COMPAZINE) 10 MG tablet Take 1 tablet (10 mg total) by mouth every 6 (six) hours as needed (Nausea or vomiting). 30 tablet 1  . spironolactone (ALDACTONE) 25 MG tablet Take 25 mg by mouth daily.    . traZODone (DESYREL) 100 MG tablet Take 100 mg by mouth at bedtime.     . valACYclovir (VALTREX) 1000 MG tablet Take 1,000 mg by mouth 2 (two) times daily as needed (for out breaks).      No current facility-administered medications for this visit.     PHYSICAL EXAMINATION: ECOG PERFORMANCE STATUS:1  Vitals:   04/25/18 0951  BP: 127/75  Pulse: 88  Resp: 19  Temp: (!) 97.4 F (36.3 C)  SpO2: 99%   Filed Weights   04/25/18 0951  Weight: 113 lb (51.3 kg)    GENERAL: alert, no distress and comfortable SKIN: skin color, texture, turgor are normal, no rashes or significant lesions EYES: normal, Conjunctiva are pink and non-injected,  sclera clear OROPHARYNX: no exudate, no erythema and lips, buccal mucosa, and tongue normal  NECK: supple, thyroid normal size, non-tender, without nodularity LYMPH: no palpable lymphadenopathy in the cervical, axillary or inguinal LUNGS: clear to auscultation and percussion with normal breathing effort HEART: regular rate & rhythm and no murmurs and no lower extremity edema ABDOMEN: abdomen soft, non-tender and normal bowel sounds MUSCULOSKELETAL: no cyanosis of digits and no clubbing  NEURO: alert & oriented x 3 with fluent speech, no focal motor/sensory deficits EXTREMITIES: No lower extremity edema  LABORATORY DATA:  I have reviewed the data as listed CMP Latest Ref Rng & Units 03/11/2018 02/15/2018 02/05/2018  Glucose 70 - 99 mg/dL 108(H) 88 122(H)  BUN 6 - 20 mg/dL 12 12 13   Creatinine 0.44 - 1.00 mg/dL 0.82 0.71 0.69  Sodium 135 - 145  mmol/L 141 141 139  Potassium 3.5 - 5.1 mmol/L 4.1 4.4 3.9  Chloride 98 - 111 mmol/L 100 104 104  CO2 22 - 32 mmol/L 29 27 27   Calcium 8.9 - 10.3 mg/dL 9.8 9.4 9.2  Total Protein 6.5 - 8.1 g/dL 7.6 6.4(L) 6.0(L)  Total Bilirubin 0.3 - 1.2 mg/dL 0.4 0.3 0.3  Alkaline Phos 38 - 126 U/L 57 49 49  AST 15 - 41 U/L 24 21 21   ALT 0 - 44 U/L 12 13 15     Lab Results  Component Value Date   WBC 4.6 03/11/2018   HGB 14.2 03/11/2018   HCT 42.2 03/11/2018   MCV 92.1 03/11/2018   PLT 292 03/11/2018   NEUTROABS 2.9 03/11/2018    ASSESSMENT & PLAN:  Malignant neoplasm of lower-inner quadrant of right breast of female, estrogen receptor positive ([place]) 08/16/2017: Right lumpectomy: Grade 2 IDC 1.5 cm, with DCIS and necrosis, 0/3 lymph nodes negative, ER 95%, PR 30%, HER-2 negative ratio 1.14, Ki-67 40%, T1CN0 stage Ia Resection of the margins 09/11/2017: Benign  Treatment plan: 1. Systemic chemotherapy with dose dense Adriamycin and Cytoxan x4 followed by Taxol weekly x12 2. Adjuvant radiation therapy 12/2/219-04/25/2018 3. followed by adjuvant  antiestrogen therapy.   -------------------------------------------------------------------------------- Treatment plan: Since she completed radiation therapy, I recommended starting antiestrogen therapy with anastrozole 1 mg daily to start 05/11/2018  Anastrozole counseling:We discussed the risks and benefits of anti-estrogen therapy with aromatase inhibitors. These include but not limited to insomnia, hot flashes, mood changes, vaginal dryness, bone density loss, and weight gain. We strongly believe that the benefits far outweigh the risks. Patient understands these risks and consented to starting treatment. Planned treatment duration is 7 years.  I sent a prescription for anastrozole.  Return to clinic in 3 months for survivorship care plan visit     No orders of the defined types were placed in this encounter.  The patient has a good understanding of the overall plan. she agrees with it. she will call with any problems that may develop before the next visit here.  Nicholas Lose, MD 04/25/2018  Julious Oka Dorshimer am acting as scribe for Dr. Nicholas Lose.  I have reviewed the above documentation for accuracy and completeness, and I agree with the above.

## 2018-04-25 ENCOUNTER — Ambulatory Visit
Admission: RE | Admit: 2018-04-25 | Discharge: 2018-04-25 | Disposition: A | Discharge status: Home / Self Care | Payer: 59 | Source: Ambulatory Visit | Attending: Radiation Oncology | Admitting: Radiation Oncology

## 2018-04-25 ENCOUNTER — Inpatient Hospital Stay: Payer: 59 | Attending: Hematology and Oncology | Admitting: Hematology and Oncology

## 2018-04-25 DIAGNOSIS — M25473 Effusion, unspecified ankle: Secondary | ICD-10-CM

## 2018-04-25 DIAGNOSIS — C50311 Malignant neoplasm of lower-inner quadrant of right female breast: Secondary | ICD-10-CM | POA: Diagnosis not present

## 2018-04-25 DIAGNOSIS — N951 Menopausal and female climacteric states: Secondary | ICD-10-CM | POA: Insufficient documentation

## 2018-04-25 DIAGNOSIS — Z170 Estrogen receptor positive status [ER+]: Secondary | ICD-10-CM | POA: Diagnosis not present

## 2018-04-25 MED ORDER — ANASTROZOLE 1 MG PO TABS: 1.0000 mg | ORAL_TABLET | Freq: Every day | ORAL | 3 refills | Status: DC

## 2018-04-25 NOTE — Assessment & Plan Note (Signed)
08/16/2017: Right lumpectomy: Grade 2 IDC 1.5 cm, with DCIS and necrosis, 0/3 lymph nodes negative, ER 95%, PR 30%, HER-2 negative ratio 1.14, Ki-67 40%, T1CN0 stage Ia Resection of the margins 09/11/2017: Benign  Treatment plan: 1. Systemic chemotherapy with dose dense Adriamycin and Cytoxan x4 followed by Taxol weekly x12 2. Adjuvant radiation therapy 12/2/219-04/25/2018 3. followed by adjuvant antiestrogen therapy.   -------------------------------------------------------------------------------- Treatment plan: Since she completed radiation therapy, I recommended starting antiestrogen therapy with anastrozole 1 mg daily to start 05/11/2018  Anastrozole counseling:We discussed the risks and benefits of anti-estrogen therapy with aromatase inhibitors. These include but not limited to insomnia, hot flashes, mood changes, vaginal dryness, bone density loss, and weight gain. We strongly believe that the benefits far outweigh the risks. Patient understands these risks and consented to starting treatment. Planned treatment duration is 7 years.  I sent a prescription for anastrozole.  Return to clinic in 3 months for survivorship care plan visit

## 2018-04-26 ENCOUNTER — Ambulatory Visit
Admission: RE | Admit: 2018-04-26 | Discharge: 2018-04-26 | Disposition: A | Discharge status: Home / Self Care | Payer: 59 | Source: Ambulatory Visit | Attending: Radiation Oncology | Admitting: Radiation Oncology

## 2018-04-26 ENCOUNTER — Encounter: Payer: Self-pay | Admitting: Radiation Oncology

## 2018-04-26 ENCOUNTER — Encounter: Payer: Self-pay | Admitting: *Deleted

## 2018-04-26 DIAGNOSIS — C50311 Malignant neoplasm of lower-inner quadrant of right female breast: Secondary | ICD-10-CM | POA: Diagnosis not present

## 2018-04-29 ENCOUNTER — Other Ambulatory Visit: Payer: Self-pay | Admitting: Hematology and Oncology

## 2018-05-01 ENCOUNTER — Telehealth: Payer: Self-pay

## 2018-05-01 NOTE — Telephone Encounter (Signed)
Per 1/22 voice msg follow return calls. At patient request to r/s appointment it was moved to 6/4 from 5/21. Message sheet did not indicate if she wanted appointment moved up or back.

## 2018-05-08 ENCOUNTER — Telehealth: Payer: Self-pay | Admitting: Hematology and Oncology

## 2018-05-08 NOTE — Telephone Encounter (Signed)
Thank you Melissa.

## 2018-05-08 NOTE — Telephone Encounter (Signed)
Spoke with patient today re new date/time for SCP visit 6/16 @ 2 pm.  Patient was scheduled for SCP visit 5/21, however started a new job and needed to reschedule appointment. Appointment moved from 5/21 to 6/4. Patient called again and stated she needed an appointment as late as possible to keep from missing any work time. Next available PM SCP visit is 6/1. Per patient being that next available is 6/1 and last day of schedule is 6/12 she would rather take the next available after 6/12. Patient given appointment for 6/16 @ 2 pm.   Above message forwarded to VG/desk nurse.

## 2018-05-14 ENCOUNTER — Other Ambulatory Visit: Payer: Self-pay | Admitting: Hematology and Oncology

## 2018-05-14 DIAGNOSIS — C50311 Malignant neoplasm of lower-inner quadrant of right female breast: Secondary | ICD-10-CM

## 2018-05-14 DIAGNOSIS — Z170 Estrogen receptor positive status [ER+]: Principal | ICD-10-CM

## 2018-05-28 ENCOUNTER — Other Ambulatory Visit: Payer: Self-pay | Admitting: Hematology and Oncology

## 2018-05-28 NOTE — Progress Notes (Signed)
  Radiation Oncology         [phone]) [phone] ________________________________  Name: Jasmine Allen MRN: [id number]  Date: 04/26/2018  DOB: [birth year]  End of Treatment Note  Diagnosis:   53 y.o. 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 This was delivered using a 3-D conformal technique. The patient then received a boost to the seroma. This delivered an additional 10 Gy in 5 fractions using 9E, 12E electrons with a special teletherapy technique. The total dose was 60.4 Gy.  Narrative: The patient tolerated radiation treatment relatively well.   The patient had some expected skin irritation with moderate erythema as she progressed during treatment. Moist desquamation was not present at the end of treatment. She continues to use her Radiaplex cream as prescribed. She also noted increased fatigue.  Plan: The patient has completed radiation treatment. The patient will return to radiation oncology clinic for routine followup in one month. I advised the patient to call or return sooner if they have any questions or concerns related to their recovery or treatment. ________________________________  Jodelle Gross, M.D., Ph.D.  This document serves as a record of services personally performed by Kyung Rudd, MD. It was created on his behalf by Rae Lips, a trained medical scribe. The creation of this record is based on the scribe's personal observations and the provider's statements to them. This document has been checked and approved by the attending provider.

## 2018-05-29 ENCOUNTER — Other Ambulatory Visit: Payer: Self-pay | Admitting: Hematology and Oncology

## 2018-06-13 ENCOUNTER — Telehealth: Payer: Self-pay | Admitting: Radiation Oncology

## 2018-06-13 NOTE — Telephone Encounter (Signed)
New message:    Per patient she is cancelling her appt due to some other issues at this time and will call back and reschedule when ready.

## 2018-07-01 ENCOUNTER — Other Ambulatory Visit: Payer: Self-pay | Admitting: Hematology and Oncology

## 2018-07-01 ENCOUNTER — Ambulatory Visit: Payer: 59 | Attending: Radiation Oncology | Admitting: Radiation Oncology

## 2018-07-09 ENCOUNTER — Other Ambulatory Visit: Payer: Self-pay | Admitting: Hematology and Oncology

## 2018-07-09 DIAGNOSIS — Z170 Estrogen receptor positive status [ER+]: Principal | ICD-10-CM

## 2018-07-09 DIAGNOSIS — C50311 Malignant neoplasm of lower-inner quadrant of right female breast: Secondary | ICD-10-CM

## 2018-07-11 ENCOUNTER — Other Ambulatory Visit: Payer: Self-pay

## 2018-07-11 DIAGNOSIS — C50311 Malignant neoplasm of lower-inner quadrant of right female breast: Secondary | ICD-10-CM

## 2018-07-11 DIAGNOSIS — Z170 Estrogen receptor positive status [ER+]: Principal | ICD-10-CM

## 2018-07-11 MED ORDER — LORAZEPAM 0.5 MG PO TABS: ORAL_TABLET | ORAL | 1 refills | Status: DC

## 2018-08-28 ENCOUNTER — Other Ambulatory Visit: Payer: Self-pay | Admitting: Hematology and Oncology

## 2018-08-29 ENCOUNTER — Encounter: Payer: 59 | Admitting: Adult Health

## 2018-09-08 ENCOUNTER — Other Ambulatory Visit: Payer: Self-pay | Admitting: Hematology and Oncology

## 2018-09-08 DIAGNOSIS — C50311 Malignant neoplasm of lower-inner quadrant of right female breast: Secondary | ICD-10-CM

## 2018-09-12 ENCOUNTER — Encounter: Payer: 59 | Admitting: Adult Health

## 2018-09-23 ENCOUNTER — Telehealth: Payer: Self-pay | Admitting: Oncology

## 2018-09-23 ENCOUNTER — Telehealth: Payer: Self-pay | Admitting: Adult Health

## 2018-09-23 NOTE — Telephone Encounter (Signed)
Called patient regarding upcoming Webex appointment, changed this to a telephone visit due to no communication to set up Webex.

## 2018-09-24 ENCOUNTER — Inpatient Hospital Stay: Payer: 59 | Attending: Adult Health | Admitting: Adult Health

## 2018-09-24 ENCOUNTER — Encounter: Payer: Self-pay | Admitting: Adult Health

## 2018-09-24 DIAGNOSIS — Z923 Personal history of irradiation: Secondary | ICD-10-CM

## 2018-09-24 DIAGNOSIS — Z170 Estrogen receptor positive status [ER+]: Secondary | ICD-10-CM

## 2018-09-24 DIAGNOSIS — E2839 Other primary ovarian failure: Secondary | ICD-10-CM | POA: Diagnosis not present

## 2018-09-24 DIAGNOSIS — C50311 Malignant neoplasm of lower-inner quadrant of right female breast: Secondary | ICD-10-CM | POA: Diagnosis not present

## 2018-09-24 DIAGNOSIS — Z79811 Long term (current) use of aromatase inhibitors: Secondary | ICD-10-CM

## 2018-09-24 DIAGNOSIS — Z9221 Personal history of antineoplastic chemotherapy: Secondary | ICD-10-CM

## 2018-09-24 NOTE — Progress Notes (Signed)
SURVIVORSHIP VIRTUAL VISIT:  I connected with [name] on 09/24/18 at  2:00 PM EDT by Telephone and verified that I am speaking with the correct person using two identifiers.   I discussed the limitations, risks, security and privacy concerns of performing an evaluation and management service by telephone and the availability of in person appointments. I also discussed with the patient that there may be a patient responsible charge related to this service. The patient expressed understanding and agreed to proceed.   BRIEF ONCOLOGIC HISTORY:  Oncology History  Malignant neoplasm of lower-inner quadrant of right breast of female, estrogen receptor positive ([place])  08/16/2017 Initial Diagnosis   Right lumpectomy: 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  High risk   08/16/2017 Cancer Staging   Staging form: Breast, AJCC 8th Edition - Pathologic stage from 08/16/2017: Stage IA (pT1c, pN0, cM0, G2, ER+, PR+, HER2-, Oncotype DX score: 31) - Signed by Gardenia Phlegm, NP on 09/12/2018   09/25/2017 - 02/05/2018 Chemotherapy   Adjuvant chemotherapy with dose dense Adriamycin and Cytoxan x4 followed by Taxol weekly x12    03/11/2018 - 04/25/2018 Radiation Therapy   Adjuvant XRT   05/2018 -  Anti-estrogen oral therapy   Anastrozole daily, plan for 7 years     INTERVAL HISTORY:  [name] to review her survivorship care plan detailing her treatment course for breast cancer, as well as monitoring long-term side effects of that treatment, education regarding health maintenance, screening, and overall wellness and health promotion.     Overall, [name] reports feeling quite well.  She is taking Anastrozole daily.  She notes that she is not taking the  Anastrozole daily.  She was fearful after reading about the side effects from the therapy, and had such a difficult time with menopause she didn't want to experience anything else.    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  She notes that it has kept her mind occupied, and she hasn't been as anxious.    [place] had been put on Bumex 25m BID for lower extremity swelling.  She doesn't want to be on this long term.  She has decreased the dose to once a day since she isn't at work during the summer.    REVIEW OF SYSTEMS:  Review of Systems  Constitutional: Negative for appetite change, chills, fatigue, fever and unexpected weight change.  HENT:   Negative for hearing loss, lump/mass, mouth sores, sore throat and trouble swallowing.   Eyes: Negative for eye problems and icterus.  Respiratory: Negative for chest tightness, cough and shortness of breath.   Cardiovascular: Negative for chest pain, leg swelling and palpitations.  Gastrointestinal: Negative for abdominal distention, blood in stool, constipation, diarrhea, nausea and vomiting.  Endocrine: Negative for hot flashes.  Genitourinary: Negative for difficulty urinating.   Musculoskeletal: Negative for arthralgias.  Skin: Negative for rash.  Neurological: Negative for dizziness, extremity weakness, headaches and numbness.  Hematological: Negative for adenopathy. Does not bruise/bleed easily.  Psychiatric/Behavioral: Negative for depression. The patient is not nervous/anxious.   Breast: Denies any new nodularity, masses, tenderness, nipple changes, or nipple discharge.    ONCOLOGY TREATMENT TEAM:  1.  Surgeon:  Dr. Donne Hazel at [facility] 2. Medical Oncologist: Dr. Lindi Adie  3. Radiation Oncologist: Dr. Lisbeth Renshaw     PAST MEDICAL/SURGICAL HISTORY:  Past Medical History:  Diagnosis Date  . Alopecia, unspecified   . Anemia, unspecified    during her pregnancy   . Anxiety state, unspecified   . Bulging disc   . Depressive disorder, not elsewhere classified   . Diverticulosis of colon (without mention of hemorrhage)    CT Scan  . Dysrhythmia    PVCs in the past  . Esophageal reflux   . Esophagitis 1998  . Hiatal hernia 1998  . History of kidney stones   . Hypercholesteremia   . Hypertension   . Migraine   . Opioid abuse, in remission [facility])    Past Surgical History:  Procedure Laterality Date  . ABLATION    . BREAST LUMPECTOMY WITH AXILLARY LYMPH NODE BIOPSY Right 08/16/2017   Procedure: RIGHT BREAST LUMPECTOMY WITH AXILLARY SENTINEL LYMPH NODE BIOPSY;  Surgeon: Rolm Bookbinder, MD;  Location: [place];  Service: General;  Laterality: Right;  . CARDIAC CATHETERIZATION  2011   normal coronaries  . KNEE SURGERY    . PORTACATH PLACEMENT Right 09/11/2017   Procedure: INSERTION PORT-A-CATH WITH ULTRASOUND;  Surgeon: Rolm Bookbinder, MD;  Location: [place];  Service: General;  Laterality: Right;  . RE-EXCISION OF BREAST LUMPECTOMY Right 09/11/2017   Procedure: RE-EXCISION OF BREAST LUMPECTOMY;  Surgeon: Rolm Bookbinder, MD;  Location: [place];  Service: General;  Laterality: Right;  . TONSILLECTOMY       ALLERGIES:  Allergies  Allergen Reactions  . Amoxicillin-Pot Clavulanate Other (See Comments)    Big doses cause diarrhea   . Erythromycin Rash  . Metoclopramide Hcl Other (See Comments)    Restless legs  . Sulfonamide Derivatives Rash     CURRENT MEDICATIONS:  Outpatient Encounter Medications as of 09/24/2018  Medication Sig  . ACETAMINOPHEN-BUTALBITAL 50-325 MG TABS TAKE 1 TABLET BY MOUTH EVERY DAY AS NEEDED FOR HEADACHE  . ALPRAZolam (XANAX) 0.5 MG tablet Take 0.5 mg by mouth at bedtime as needed for anxiety or sleep.   Marland Kitchen anastrozole (ARIMIDEX) 1 MG tablet Take 1 tablet (1 mg total) by mouth daily.  . Biotin 10000 MCG TABS Take 10,000 mcg by mouth daily.   . bumetanide (BUMEX) 1 MG tablet  TAKE 1 TABLET(1 MG) BY MOUTH TWICE DAILY  . cloNIDine (CATAPRES) 0.3 MG tablet Take 0.3 mg by mouth at bedtime.   . cyclobenzaprine (FLEXERIL) 10 MG tablet Take 10 mg by mouth every 8 (eight) hours as needed for muscle spasms.  . hydrocortisone valerate cream (WESTCORT) 0.2 % Apply 1 application topically daily as needed (eczema).   . hydrOXYzine (ATARAX/VISTARIL) 10 MG tablet Take 10-30 mg by mouth at bedtime as needed for itching.   . hyoscyamine (LEVSIN SL) 0.125 MG SL tablet dissolve 1 tablet under the tongue every 4 hours if needed (Patient taking differently: Take 0.125 mg by mouth every 4 (four) hours as needed for cramping. )  . LORazepam (ATIVAN) 0.5 MG tablet TAKE 1 TABLET BY MOUTH AT BEDTIME AS NEEDED FOR NAUSEA  . Melatonin 5 MG CAPS Take 5 mg by mouth at bedtime as needed (for sleep).   . meloxicam (MOBIC) 7.5 MG tablet Take 1 tablet (7.5 mg total) by mouth 2 (two) times daily.  . Multiple Vitamin (MULITIVITAMIN WITH MINERALS) TABS Take 1 tablet by mouth daily.  . ondansetron (ZOFRAN) 8 MG tablet Take 1  tablet (8 mg total) by mouth 2 (two) times daily as needed. Start on the third day after chemotherapy.  . pantoprazole (PROTONIX) 40 MG tablet Take 1 tablet (40 mg total) by mouth 2 (two) times daily before a meal. (Patient taking differently: Take 40 mg by mouth daily. )  . prochlorperazine (COMPAZINE) 10 MG tablet Take 1 tablet (10 mg total) by mouth every 6 (six) hours as needed (Nausea or vomiting).  Marland Kitchen spironolactone (ALDACTONE) 25 MG tablet Take 25 mg by mouth daily.  . traZODone (DESYREL) 100 MG tablet Take 100 mg by mouth at bedtime.   . valACYclovir (VALTREX) 1000 MG tablet Take 1,000 mg by mouth 2 (two) times daily as needed (for out breaks).    No facility-administered encounter medications on file as of 09/24/2018.      ONCOLOGIC FAMILY HISTORY:  Family History  Problem Relation Age of Onset  . Heart disease Father        CABG  . Breast cancer Mother   . Breast cancer  Maternal Grandmother   . Breast cancer Cousin   . Colon cancer Neg Hx   . Stomach cancer Neg Hx      GENETIC COUNSELING/TESTING: Tested through Dr. Corinna Capra, negative panel  SOCIAL HISTORY:  Social History   Socioeconomic History  . Marital status: Married    Spouse name: Not on file  . Number of children: Not on file  . Years of education: Not on file  . Highest education level: Not on file  Occupational History  . Not on file  Social Needs  . Financial resource strain: Not on file  . Food insecurity    Worry: Not on file    Inability: Not on file  . Transportation needs    Medical: No    Non-medical: No  Tobacco Use  . Smoking status: Former Smoker    Packs/day: 0.25    Quit date: 08/25/2016    Years since quitting: 2.0  . Smokeless tobacco: Never Used  Substance and Sexual Activity  . Alcohol use: Yes    Comment: "once a week"  . Drug use: No  . Sexual activity: Yes    Birth control/protection: None, Post-menopausal  Lifestyle  . Physical activity    Days per week: Not on file    Minutes per session: Not on file  . Stress: Not on file  Relationships  . Social Herbalist on phone: Not on file    Gets together: Not on file    Attends religious service: Not on file    Active member of club or organization: Not on file    Attends meetings of clubs or organizations: Not on file    Relationship status: Not on file  . Intimate partner violence    Fear of current or ex partner: Not on file    Emotionally abused: Not on file    Physically abused: Not on file    Forced sexual activity: Not on file  Other Topics Concern  . Not on file  Social History Narrative  . Not on file     OBSERVATIONS/OBJECTIVE:  Patient is in no apparent distress.  Breathing is non labored.  Her speech is not pressured. Mood and behavior are normal.   LABORATORY DATA:  None for this visit.  DIAGNOSTIC IMAGING:  None for this visit.      ASSESSMENT AND PLAN:  Ms..  Allen is a pleasant 53 y.o. female with Stage IA right breast invasive  ductal carcinoma, ER+/PR+/HER2-, diagnosed in 08/2017, treated with lumpectomy, adjuvant chemotherapy, adjuvant radiation therapy, and anti-estrogen therapy with Anastrozole was recommended.  She presents to the Survivorship Clinic for our initial meeting and routine follow-up post-completion of treatment for breast cancer.    1. Stage IA right breast cancer:  Ms. Karis is continuing to recover from definitive treatment for breast cancer. She will follow-up with her medical oncologist, Dr. Lindi Adie in 3 months with history and physical exam per surveillance protocol.  She has opted not to start anti estrogen therapy with Anastrozole.  I informed her that this increases her breast cancer recurrence risk outside of the breast to as much as 20 percent, or higher.  I reviewed that risk with her in detail, and let her know that it is her decision alone on whether or not to take the medication, so long as she understands the risk involved with this choice.  I reviewed that she could also try one half tab of the anastrozole and see how she does and increase it if she tolerates it well.  We also reviewed that there are other anti estrogen therapies she may tolerate.  After our discussion, Elaina remains undecided and will think about this further.  Her mammogram is due 10/2018; orders placed today. Today, a comprehensive survivorship care plan and treatment summary was reviewed with the patient today detailing her breast cancer diagnosis, treatment course, potential late/long-term effects of treatment, appropriate follow-up care with recommendations for the future, and patient education resources.  A copy of this summary, along with a letter will be sent to the patient's primary care provider via mail/fax/In Basket message after today's visit.    2. Bone health:  Given Ms. Culbreth's age/history of breast cancer and her current treatment regimen  including anti-estrogen therapy with Anastrozole, she is at risk for bone demineralization.  She has not undergone bone density testing, so I ordered this today to be done with her mammogram.  In the meantime, she was encouraged to increase her consumption of foods rich in calcium, as well as increase her weight-bearing activities.  She was given education on specific activities to promote bone health.  3. Cancer screening:  Due to Ms. Roussel's history and her age, she should receive screening for skin cancers, colon cancer, and gynecologic cancers.  The information and recommendations are listed on the patient's comprehensive care plan/treatment summary and were reviewed in detail with the patient.    4. Health maintenance and wellness promotion: Ms. Percival was encouraged to consume 5-7 servings of fruits and vegetables per day. We reviewed the "Nutrition Rainbow" handout, as well as the handout "Take Control of Your Health and Reduce Your Cancer Risk" from the [place].  She was also encouraged to engage in moderate to vigorous exercise for 30 minutes per day most days of the week. We discussed the [employer] fitness program, which is designed for cancer survivors to help them become more physically fit after cancer treatments.  She was instructed to limit her alcohol consumption and continue to abstain from tobacco use.     5. Support services/counseling: It is not uncommon for this period of the patient's cancer care trajectory to be one of many emotions and stressors.  We discussed how this can be increasingly difficult during the times of quarantine and social distancing due to the COVID-19 pandemic.   She was given information regarding our available services and encouraged to contact me with any questions or for help enrolling  in any of our support group/programs.    Follow up instructions:    -Return to cancer center in three months for f/u with Dr. Lindi Adie  -Mammogram due in  10/2018 -Bone density due 10/2018 -Follow up with surgery 06/2018 -She is welcome to return back to the Survivorship Clinic at any time; no additional follow-up needed at this time.  -Consider referral back to survivorship as a long-term survivor for continued surveillance  The patient was provided an opportunity to ask questions and all were answered. The patient agreed with the plan and demonstrated an understanding of the instructions.   The patient was advised to call back or seek an in-person evaluation if the symptoms worsen or if the condition fails to improve as anticipated.   I provided 28 minutes of non face-to-face telephone visit time during this encounter, and > 50% was spent counseling as documented under my assessment & plan.  Scot Dock, NP

## 2018-09-24 NOTE — Telephone Encounter (Signed)
Opened by accident

## 2018-09-26 ENCOUNTER — Telehealth: Payer: Self-pay | Admitting: Hematology and Oncology

## 2018-09-26 NOTE — Telephone Encounter (Signed)
I talk with patient regarding schedule  

## 2018-09-30 ENCOUNTER — Other Ambulatory Visit: Payer: Self-pay | Admitting: Hematology and Oncology

## 2018-10-08 ENCOUNTER — Other Ambulatory Visit: Payer: Self-pay | Admitting: Hematology and Oncology

## 2018-10-08 DIAGNOSIS — Z170 Estrogen receptor positive status [ER+]: Secondary | ICD-10-CM

## 2018-10-08 DIAGNOSIS — C50311 Malignant neoplasm of lower-inner quadrant of right female breast: Secondary | ICD-10-CM

## 2018-10-30 ENCOUNTER — Other Ambulatory Visit: Payer: Self-pay | Admitting: Adult Health

## 2018-10-30 ENCOUNTER — Ambulatory Visit
Admission: RE | Admit: 2018-10-30 | Discharge: 2018-10-30 | Disposition: A | Discharge status: Home / Self Care | Payer: 59 | Source: Ambulatory Visit | Attending: Adult Health | Admitting: Adult Health

## 2018-10-30 ENCOUNTER — Other Ambulatory Visit: Payer: Self-pay

## 2018-10-30 DIAGNOSIS — R921 Mammographic calcification found on diagnostic imaging of breast: Secondary | ICD-10-CM

## 2018-10-30 DIAGNOSIS — Z170 Estrogen receptor positive status [ER+]: Secondary | ICD-10-CM

## 2018-10-30 DIAGNOSIS — C50311 Malignant neoplasm of lower-inner quadrant of right female breast: Secondary | ICD-10-CM

## 2018-10-31 ENCOUNTER — Other Ambulatory Visit: Payer: Self-pay | Admitting: Hematology and Oncology

## 2018-10-31 ENCOUNTER — Ambulatory Visit
Admission: RE | Admit: 2018-10-31 | Discharge: 2018-10-31 | Disposition: A | Discharge status: Home / Self Care | Payer: 59 | Source: Ambulatory Visit | Attending: Adult Health | Admitting: Adult Health

## 2018-10-31 DIAGNOSIS — R921 Mammographic calcification found on diagnostic imaging of breast: Secondary | ICD-10-CM

## 2018-11-03 ENCOUNTER — Emergency Department (HOSPITAL_COMMUNITY): Payer: 59

## 2018-11-03 ENCOUNTER — Emergency Department (HOSPITAL_COMMUNITY)
Admission: EM | Admit: 2018-11-03 | Discharge: 2018-11-03 | Disposition: A | Discharge status: Home / Self Care | Payer: 59 | Attending: Emergency Medicine | Admitting: Emergency Medicine

## 2018-11-03 ENCOUNTER — Other Ambulatory Visit: Payer: Self-pay

## 2018-11-03 ENCOUNTER — Encounter (HOSPITAL_COMMUNITY): Payer: Self-pay | Admitting: Emergency Medicine

## 2018-11-03 DIAGNOSIS — Z79899 Other long term (current) drug therapy: Secondary | ICD-10-CM | POA: Insufficient documentation

## 2018-11-03 DIAGNOSIS — R0789 Other chest pain: Secondary | ICD-10-CM

## 2018-11-03 DIAGNOSIS — I10 Essential (primary) hypertension: Secondary | ICD-10-CM | POA: Diagnosis not present

## 2018-11-03 DIAGNOSIS — Z853 Personal history of malignant neoplasm of breast: Secondary | ICD-10-CM | POA: Diagnosis not present

## 2018-11-03 DIAGNOSIS — Z87891 Personal history of nicotine dependence: Secondary | ICD-10-CM | POA: Insufficient documentation

## 2018-11-03 LAB — COMPREHENSIVE METABOLIC PANEL
ALT: 16 U/L (ref 0–44)
AST: 24 U/L (ref 15–41)
Albumin: 4.1 g/dL (ref 3.5–5.0)
Alkaline Phosphatase: 81 U/L (ref 38–126)
Anion gap: 7 (ref 5–15)
BUN: 20 mg/dL (ref 6–20)
CO2: 31 mmol/L (ref 22–32)
Calcium: 9.2 mg/dL (ref 8.9–10.3)
Chloride: 100 mmol/L (ref 98–111)
Creatinine, Ser: 0.74 mg/dL (ref 0.44–1.00)
GFR calc Af Amer: >60 mL/min (ref >60)
GFR calc non Af Amer: >60 mL/min (ref >60)
Glucose, Bld: 129 mg/dL — ABNORMAL HIGH (ref 70–99)
Potassium: 3.4 mmol/L — ABNORMAL LOW (ref 3.5–5.1)
Sodium: 138 mmol/L (ref 135–145)
Total Bilirubin: 0.5 mg/dL (ref 0.3–1.2)
Total Protein: 7.1 g/dL (ref 6.5–8.1)

## 2018-11-03 LAB — CBC WITH DIFFERENTIAL/PLATELET
Abs Immature Granulocytes: 0.02 10*3/uL (ref 0.00–0.07)
Basophils Absolute: 0 10*3/uL (ref 0.0–0.1)
Basophils Relative: 0 %
Eosinophils Absolute: 0.2 10*3/uL (ref 0.0–0.5)
Eosinophils Relative: 2 %
HCT: 40.4 % (ref 36.0–46.0)
Hemoglobin: 13.7 g/dL (ref 12.0–15.0)
Immature Granulocytes: 0 %
Lymphocytes Relative: 20 %
Lymphs Abs: 1.8 10*3/uL (ref 0.7–4.0)
MCH: 29.9 pg (ref 26.0–34.0)
MCHC: 33.9 g/dL (ref 30.0–36.0)
MCV: 88.2 fL (ref 80.0–100.0)
Monocytes Absolute: 0.7 10*3/uL (ref 0.1–1.0)
Monocytes Relative: 8 %
Neutro Abs: 6.2 10*3/uL (ref 1.7–7.7)
Neutrophils Relative %: 70 %
Platelets: 227 10*3/uL (ref 150–400)
RBC: 4.58 MIL/uL (ref 3.87–5.11)
RDW: 12.9 % (ref 11.5–15.5)
WBC: 8.9 10*3/uL (ref 4.0–10.5)
nRBC: 0 % (ref 0.0–0.2)

## 2018-11-03 LAB — D-DIMER, QUANTITATIVE: D-Dimer, Quant: 0.3 ug/mL-FEU (ref 0.00–0.50)

## 2018-11-03 LAB — TROPONIN I (HIGH SENSITIVITY): Troponin I (High Sensitivity): 6 ng/L (ref <18)

## 2018-11-03 LAB — LIPASE, BLOOD: Lipase: 38 U/L (ref 11–51)

## 2018-11-03 MED ORDER — METHOCARBAMOL 500 MG PO TABS: 500.0000 mg | ORAL_TABLET | Freq: Three times a day (TID) | ORAL | 0 refills | Status: DC | PRN

## 2018-11-03 MED ORDER — HYDROCODONE-ACETAMINOPHEN 5-325 MG PO TABS: 1.0000 | ORAL_TABLET | ORAL | 0 refills | Status: DC | PRN

## 2018-11-03 MED ORDER — ONDANSETRON HCL 4 MG/2ML IJ SOLN
4.0000 mg | Freq: Once | INTRAMUSCULAR | Status: AC
  Administered 2018-11-03: 03:00:00 4 mg via INTRAVENOUS
  Filled 2018-11-03: qty 2

## 2018-11-03 MED ORDER — IBUPROFEN 800 MG PO TABS: 800.0000 mg | ORAL_TABLET | Freq: Four times a day (QID) | ORAL | 0 refills | Status: DC | PRN

## 2018-11-03 MED ORDER — SODIUM CHLORIDE 0.9 % IV BOLUS
500.0000 mL | Freq: Once | INTRAVENOUS | Status: AC
  Administered 2018-11-03: 03:00:00 500 mL via INTRAVENOUS

## 2018-11-03 MED ORDER — KETOROLAC TROMETHAMINE 30 MG/ML IJ SOLN
30.0000 mg | Freq: Once | INTRAMUSCULAR | Status: AC
  Administered 2018-11-03: 04:00:00 30 mg via INTRAVENOUS
  Filled 2018-11-03: qty 1

## 2018-11-03 MED ORDER — HYDROMORPHONE HCL 1 MG/ML IJ SOLN
1.0000 mg | Freq: Once | INTRAMUSCULAR | Status: AC
  Administered 2018-11-03: 1 mg via INTRAVENOUS
  Filled 2018-11-03: qty 1

## 2018-11-03 NOTE — ED Triage Notes (Signed)
Patient states that chest  pain started around 20:30 last night. Patient took some medications for muscle spasms at home earlier but no relief. Patient states it hurts to take a breath.

## 2018-11-03 NOTE — ED Provider Notes (Signed)
[facility] EMERGENCY DEPARTMENT Provider Note   CSN: [id number] Arrival date & time: 11/03/18  0251    History   Chief Complaint Chief Complaint  Patient presents with  . Chest Pain    HPI Jasmine Allen is a 53 y.o. female.     Patient presents to the emergency department for evaluation of chest pain.  Patient reports that symptoms began around 8:30 PM.  She has noticed that sharp and stabbing pain in the center of her chest.  This pain significantly worsens with movement.  The area is tender to the touch.  She has noticed that she cannot take a deep breath because breathing deeply causes worsening of the pain.  She does have a history of esophageal spasms, tried Levsin tonight without any improvement.     Past Medical History:  Diagnosis Date  . Alopecia, unspecified   . Anemia, unspecified    during her pregnancy  . Anxiety state, unspecified   . Breast cancer ([place]) 2019   Right Breast Cancer  . Bulging disc   . Depressive disorder, not elsewhere classified   . Diverticulosis of colon (without mention of hemorrhage)    CT Scan  . Dysrhythmia    PVCs in the past  . Esophageal reflux   . Esophagitis 1998  . Hiatal hernia 1998  . History of kidney stones   . Hypercholesteremia   . Hypertension   . Migraine   . Opioid abuse, in remission [facility])     Patient Active Problem List   Diagnosis Date Noted  . Port-A-Cath in place 10/23/2017  . Malignant neoplasm of lower-inner quadrant of right breast of female, estrogen receptor positive ([place]) 08/23/2017  . Chest pain 04/07/2014  . Smoking 04/07/2014  . ANEMIA, IRON DEFICIENCY 10/20/2009  . DEPRESSION 06/15/2009  . GERD 06/15/2009  . IRRITABLE BOWEL SYNDROME 06/15/2009  . RECTAL BLEEDING 06/15/2009    Past Surgical History:  Procedure Laterality Date  . ABLATION    . BREAST LUMPECTOMY Right 08/16/2017  . BREAST LUMPECTOMY WITH AXILLARY LYMPH NODE BIOPSY Right 08/16/2017   Procedure: RIGHT BREAST LUMPECTOMY  WITH AXILLARY SENTINEL LYMPH NODE BIOPSY;  Surgeon: Rolm Bookbinder, MD;  Location: [place];  Service: General;  Laterality: Right;  . CARDIAC CATHETERIZATION  2011   normal coronaries  . KNEE SURGERY    . PORTACATH PLACEMENT Right 09/11/2017   Procedure: INSERTION PORT-A-CATH WITH ULTRASOUND;  Surgeon: Rolm Bookbinder, MD;  Location: [place];  Service: General;  Laterality: Right;  . RE-EXCISION OF BREAST LUMPECTOMY Right 09/11/2017   Procedure: RE-EXCISION OF BREAST LUMPECTOMY;  Surgeon: Rolm Bookbinder, MD;  Location: [place];  Service: General;  Laterality: Right;  . TONSILLECTOMY       OB History   No obstetric history on file.      Home Medications    Prior to Admission medications   Medication Sig Start Date End Date Taking? Authorizing Provider  ACETAMINOPHEN-BUTALBITAL 50-325 MG TABS TAKE 1 TABLET BY MOUTH EVERY DAY AS NEEDED FOR HEADACHE 02/25/18   [provider]  ALPRAZolam Duanne Moron) 0.5 MG tablet Take 0.5 mg by mouth at bedtime as needed for anxiety or sleep.  10/21/13   [provider]  anastrozole (ARIMIDEX) 1 MG tablet Take 1 tablet (1 mg total) by mouth daily. 04/25/18   Nicholas Lose, MD  Biotin 10000 MCG TABS Take 10,000 mcg by mouth daily.     [provider]  bumetanide (BUMEX) 1 MG tablet TAKE 1  TABLET(1 MG) BY MOUTH TWICE DAILY 10/31/18   Nicholas Lose, MD  cloNIDine (CATAPRES) 0.3 MG tablet Take 0.3 mg by mouth at bedtime.     [provider]  cyclobenzaprine (FLEXERIL) 10 MG tablet Take 10 mg by mouth every 8 (eight) hours as needed for muscle spasms. 07/16/17   [provider]  HYDROcodone-acetaminophen (NORCO/VICODIN) 5-325 MG tablet Take 1-2 tablets by mouth every 4 (four) hours as needed for moderate pain. 11/03/18   Orpah Greek, MD  hydrocortisone valerate cream (WESTCORT) 0.2 % Apply 1 application topically daily as needed (eczema).  08/21/12   [provider]   hydrOXYzine (ATARAX/VISTARIL) 10 MG tablet Take 10-30 mg by mouth at bedtime as needed for itching.  10/07/15   [provider]  hyoscyamine (LEVSIN SL) 0.125 MG SL tablet dissolve 1 tablet under the tongue every 4 hours if needed Patient taking differently: Take 0.125 mg by mouth every 4 (four) hours as needed for cramping.  10/02/16   Mauri Pole, MD  ibuprofen (ADVIL) 800 MG tablet Take 1 tablet (800 mg total) by mouth every 6 (six) hours as needed for moderate pain. 11/03/18   Orpah Greek, MD  LORazepam (ATIVAN) 0.5 MG tablet TAKE 1 TABLET BY MOUTH AT BEDTIME AS NEEDED FOR NAUSEA 10/08/18   Nicholas Lose, MD  Melatonin 5 MG CAPS Take 5 mg by mouth at bedtime as needed (for sleep).     [provider]  meloxicam (MOBIC) 7.5 MG tablet Take 1 tablet (7.5 mg total) by mouth 2 (two) times daily. 01/29/18   Nicholas Lose, MD  methocarbamol (ROBAXIN) 500 MG tablet Take 1 tablet (500 mg total) by mouth every 8 (eight) hours as needed for muscle spasms. 11/03/18   Orpah Greek, MD  Multiple Vitamin (MULITIVITAMIN WITH MINERALS) TABS Take 1 tablet by mouth daily.    [provider]  ondansetron (ZOFRAN) 8 MG tablet Take 1 tablet (8 mg total) by mouth 2 (two) times daily as needed. Start on the third day after chemotherapy. 09/06/17   Nicholas Lose, MD  pantoprazole (PROTONIX) 40 MG tablet Take 1 tablet (40 mg total) by mouth 2 (two) times daily before a meal. Patient taking differently: Take 40 mg by mouth daily.  06/28/16   Mauri Pole, MD  prochlorperazine (COMPAZINE) 10 MG tablet Take 1 tablet (10 mg total) by mouth every 6 (six) hours as needed (Nausea or vomiting). 12/18/17   Nicholas Lose, MD  spironolactone (ALDACTONE) 25 MG tablet Take 25 mg by mouth daily.    [provider]  traZODone (DESYREL) 100 MG tablet Take 100 mg by mouth at bedtime.  10/17/12   [provider]  valACYclovir (VALTREX) 1000 MG tablet Take 1,000 mg by  mouth 2 (two) times daily as needed (for out breaks).     [provider]    Family History Family History  Problem Relation Age of Onset  . Heart disease Father        CABG  . Breast cancer Mother   . Breast cancer Maternal Grandmother   . Breast cancer Cousin   . Colon cancer Neg Hx   . Stomach cancer Neg Hx     Social History Social History   Tobacco Use  . Smoking status: Former Smoker    Packs/day: 0.25    Quit date: 08/25/2016    Years since quitting: 2.1  . Smokeless tobacco: Never Used  Substance Use Topics  . Alcohol use: Yes  Comment: "once a week"  . Drug use: No     Allergies   Amoxicillin-pot clavulanate, Erythromycin, Metoclopramide hcl, and Sulfonamide derivatives   Review of Systems Review of Systems  Cardiovascular: Positive for chest pain.  All other systems reviewed and are negative.    Physical Exam Updated Vital Signs BP 101/60   Pulse 66   Temp 98.2 F (36.8 C) (Oral)   Resp 10   Ht 5' (1.524 m)   Wt 50.8 kg   LMP 12/24/2010   SpO2 100%   BMI 21.87 kg/m   Physical Exam Vitals signs and nursing note reviewed.  Constitutional:      General: She is in acute distress.     Appearance: Normal appearance. She is well-developed. She is not ill-appearing or toxic-appearing.  HENT:     Head: Normocephalic and atraumatic.     Right Ear: Hearing normal.     Left Ear: Hearing normal.     Nose: Nose normal.  Eyes:     Conjunctiva/sclera: Conjunctivae normal.     Pupils: Pupils are equal, round, and reactive to light.  Neck:     Musculoskeletal: Normal range of motion and neck supple.  Cardiovascular:     Rate and Rhythm: Regular rhythm.     Heart sounds: S1 normal and S2 normal. No murmur. No friction rub. No gallop.   Pulmonary:     Effort: Pulmonary effort is normal. No respiratory distress.     Breath sounds: Normal breath sounds.  Chest:     Chest wall: Tenderness present.  Abdominal:     General: Bowel sounds are  normal.     Palpations: Abdomen is soft.     Tenderness: There is no abdominal tenderness. There is no guarding or rebound. Negative signs include Murphy's sign and McBurney's sign.     Hernia: No hernia is present.  Musculoskeletal: Normal range of motion.  Skin:    General: Skin is warm and dry.     Findings: No rash.  Neurological:     Mental Status: She is alert and oriented to person, place, and time.     GCS: GCS eye subscore is 4. GCS verbal subscore is 5. GCS motor subscore is 6.     Cranial Nerves: No cranial nerve deficit.     Sensory: No sensory deficit.     Coordination: Coordination normal.  Psychiatric:        Speech: Speech normal.        Behavior: Behavior normal.        Thought Content: Thought content normal.      ED Treatments / Results  Labs (all labs ordered are listed, but only abnormal results are displayed) Labs Reviewed  COMPREHENSIVE METABOLIC PANEL - Abnormal; Notable for the following components:      Result Value   Potassium 3.4 (*)    Glucose, Bld 129 (*)    All other components within normal limits  CBC WITH DIFFERENTIAL/PLATELET  LIPASE, BLOOD  D-DIMER, QUANTITATIVE (NOT AT [facility])  TROPONIN I (HIGH SENSITIVITY)  TROPONIN I (HIGH SENSITIVITY)    EKG EKG Interpretation  Date/Time:  Sunday November 03 2018 03:01:38 EDT Ventricular Rate:  80 PR Interval:    QRS Duration: 88 QT Interval:  413 QTC Calculation: 477 R Axis:   51 Text Interpretation:  Sinus rhythm Normal ECG Confirmed by Orpah Greek ([id number]) on 11/03/2018 3:05:48 AM   Radiology Dg Chest Port 1 View  Result Date: 11/03/2018 CLINICAL DATA:  53 year old female  with history of chest pain since 8:30 yesterday evening. EXAM: PORTABLE CHEST 1 VIEW COMPARISON:  Chest x-ray 09/11/2017. FINDINGS: Subtle opacity in the periphery of the left lung base which may reflect a small amount of pleural fluid or small area of basilar consolidation/atelectasis. Right lung is clear. No right  pleural effusion. No pneumothorax. No evidence of pulmonary edema. Heart size is normal. Upper mediastinal contours are within normal limits. IMPRESSION: 1. No opacity in the lateral aspect of the base of the left hemithorax which may reflect a small amount of pleural fluid or area of peripheral atelectasis/consolidation. Electronically Signed   By: Vinnie Langton M.D.   On: 11/03/2018 04:12    Procedures Procedures (including critical care time)  Medications Ordered in ED Medications  sodium chloride 0.9 % bolus 500 mL (0 mLs Intravenous Stopped 11/03/18 0414)  HYDROmorphone (DILAUDID) injection 1 mg (1 mg Intravenous Given 11/03/18 0312)  ondansetron (ZOFRAN) injection 4 mg (4 mg Intravenous Given 11/03/18 0312)  ketorolac (TORADOL) 30 MG/ML injection 30 mg (30 mg Intravenous Given 11/03/18 0421)     Initial Impression / Assessment and Plan / ED Course  I have reviewed the triage vital signs and the nursing notes.  Pertinent labs & imaging results that were available during my care of the patient were reviewed by me and considered in my medical decision making (see chart for details).        Patient presents to the emergency department for chest pain.  Pain is in the center and to the right side of her chest.  She describes the pain as sharp and appears uncomfortable at arrival.  Minimal movements of her torso significantly worsen the pain.  Area is tender as well.  This is most consistent with chest wall etiology.  Her work-up has been favorable.  Chest x-ray does not show any pathology that would explain her pain.  EKG was normal, troponin negative.  D-dimer was also normal.  She has not hypoxic, tachycardic or tachypneic.  She does have a history of cancer, however, her work-up, history and examination simply do not support concern for PE at this time.  Patient had some improvement with IV Dilaudid but pain completely resolved after IV Toradol.  This also supports diagnosis of inflammatory  process causing her pain.  It is felt that the patient is appropriate for discharge at this time with analgesia and rest, follow-up as needed.  Final Clinical Impressions(s) / ED Diagnoses   Final diagnoses:  Chest wall pain    ED Discharge Orders         Ordered    HYDROcodone-acetaminophen (NORCO/VICODIN) 5-325 MG tablet  Every 4 hours PRN     11/03/18 0459    ibuprofen (ADVIL) 800 MG tablet  Every 6 hours PRN     11/03/18 0459    methocarbamol (ROBAXIN) 500 MG tablet  Every 8 hours PRN     11/03/18 0500           Tacari Repass, Gwenyth Allegra, MD 11/03/18 0500

## 2018-11-07 ENCOUNTER — Other Ambulatory Visit: Payer: Self-pay

## 2018-11-07 DIAGNOSIS — C50311 Malignant neoplasm of lower-inner quadrant of right female breast: Secondary | ICD-10-CM

## 2018-11-07 MED ORDER — LORAZEPAM 0.5 MG PO TABS: ORAL_TABLET | ORAL | 0 refills | Status: DC

## 2018-11-22 ENCOUNTER — Other Ambulatory Visit: Payer: Self-pay | Admitting: Family Medicine

## 2018-11-22 ENCOUNTER — Other Ambulatory Visit: Payer: Self-pay

## 2018-11-22 ENCOUNTER — Ambulatory Visit
Admission: RE | Admit: 2018-11-22 | Discharge: 2018-11-22 | Disposition: A | Discharge status: Home / Self Care | Payer: 59 | Source: Ambulatory Visit | Attending: Family Medicine | Admitting: Family Medicine

## 2018-11-22 DIAGNOSIS — J9811 Atelectasis: Secondary | ICD-10-CM

## 2018-12-05 ENCOUNTER — Other Ambulatory Visit: Payer: Self-pay | Admitting: Family Medicine

## 2018-12-05 DIAGNOSIS — J9811 Atelectasis: Secondary | ICD-10-CM

## 2018-12-06 ENCOUNTER — Other Ambulatory Visit: Payer: Self-pay | Admitting: Hematology and Oncology

## 2018-12-06 DIAGNOSIS — C50311 Malignant neoplasm of lower-inner quadrant of right female breast: Secondary | ICD-10-CM

## 2018-12-06 DIAGNOSIS — Z170 Estrogen receptor positive status [ER+]: Secondary | ICD-10-CM

## 2018-12-06 MED ORDER — LORAZEPAM 0.5 MG PO TABS: ORAL_TABLET | ORAL | 1 refills | Status: DC

## 2018-12-08 ENCOUNTER — Telehealth: Payer: 59 | Admitting: Nurse Practitioner

## 2018-12-08 DIAGNOSIS — R059 Cough, unspecified: Secondary | ICD-10-CM

## 2018-12-08 DIAGNOSIS — R05 Cough: Secondary | ICD-10-CM

## 2018-12-08 MED ORDER — BENZONATATE 100 MG PO CAPS: 100.0000 mg | ORAL_CAPSULE | Freq: Three times a day (TID) | ORAL | 0 refills | Status: DC | PRN

## 2018-12-08 NOTE — Progress Notes (Signed)
We are sorry that you are not feeling well.  Here is how we plan to help!  Based on your presentation I believe you most likely have A cough due to a virus.  This is called viral bronchitis and is best treated by rest, plenty of fluids and control of the cough.  You may use Ibuprofen or Tylenol as directed to help your symptoms.     In addition you may use A prescription cough medication called Tessalon Perles 100mg . You may take 1-2 capsules every 8 hours as needed for your cough. I am not able to do any cough meds with codeine in an evisit.    From your responses in the eVisit questionnaire you describe inflammation in the upper respiratory tract which is causing a significant cough.  This is commonly called Bronchitis and has four common causes:    Allergies  Viral Infections  Acid Refluxacterial Infection Allergies, viruses and acid reflux are treated by controlling symptoms or eliminating the cause. An example might be a cough caused by taking certain blood pressure medications. You stop the cough by changing the medication. Another example might be a cough caused by acid reflux. Controlling the reflux helps control the cough.  USE OF BRONCHODILATOR ("RESCUE") INHALERS: There is a risk from using your bronchodilator too frequently.  The risk is that over-reliance on a medication which only relaxes the muscles surrounding the breathing tubes can reduce the effectiveness of medications prescribed to reduce swelling and congestion of the tubes themselves.  Although you feel brief relief from the bronchodilator inhaler, your asthma may actually be worsening with the tubes becoming more swollen and filled with mucus.  This can delay other crucial treatments, such as oral steroid medications. If you need to use a bronchodilator inhaler daily, several times per day, you should discuss this with your provider.  There are probably better treatments that could be used to keep your asthma under control.      HOME CARE . Only take medications as instructed by your medical team. . Complete the entire course of an antibiotic. . Drink plenty of fluids and get plenty of rest. . Avoid close contacts especially the very young and the elderly . Cover your mouth if you cough or cough into your sleeve. . Always remember to wash your hands . A steam or ultrasonic humidifier can help congestion.   GET HELP RIGHT AWAY IF: . You develop worsening fever. . You become short of breath . You cough up blood. . Your symptoms persist after you have completed your treatment plan MAKE SURE YOU   Understand these instructions.  Will watch your condition.  Will get help right away if you are not doing well or get worse.  Your e-visit answers were reviewed by a board certified advanced clinical practitioner to complete your personal care plan.  Depending on the condition, your plan could have included both over the counter or prescription medications. If there is a problem please reply  once you have received a response from your provider. Your safety is important to [place].  If you have drug allergies check your prescription carefully.    You can use MyChart to ask questions about today's visit, request a non-urgent call back, or ask for a work or school excuse for 24 hours related to this e-Visit. If it has been greater than 24 hours you will need to follow up with your provider, or enter a new e-Visit to address those concerns. You will get  an e-mail in the next two days asking about your experience.  I hope that your e-visit has been valuable and will speed your recovery. Thank you for using e-visits.  5-10 minutes spent reviewing and documenting in chart.

## 2018-12-09 ENCOUNTER — Other Ambulatory Visit: Payer: Self-pay

## 2018-12-09 ENCOUNTER — Emergency Department (HOSPITAL_COMMUNITY): Payer: 59

## 2018-12-09 ENCOUNTER — Encounter (HOSPITAL_COMMUNITY): Payer: Self-pay | Admitting: Emergency Medicine

## 2018-12-09 ENCOUNTER — Emergency Department (HOSPITAL_COMMUNITY)
Admission: EM | Admit: 2018-12-09 | Discharge: 2018-12-09 | Disposition: A | Discharge status: Home / Self Care | Payer: 59 | Attending: Emergency Medicine | Admitting: Emergency Medicine

## 2018-12-09 DIAGNOSIS — Z20828 Contact with and (suspected) exposure to other viral communicable diseases: Secondary | ICD-10-CM | POA: Diagnosis not present

## 2018-12-09 DIAGNOSIS — J189 Pneumonia, unspecified organism: Secondary | ICD-10-CM

## 2018-12-09 DIAGNOSIS — Z87891 Personal history of nicotine dependence: Secondary | ICD-10-CM | POA: Diagnosis not present

## 2018-12-09 DIAGNOSIS — Z79899 Other long term (current) drug therapy: Secondary | ICD-10-CM | POA: Diagnosis not present

## 2018-12-09 DIAGNOSIS — Z853 Personal history of malignant neoplasm of breast: Secondary | ICD-10-CM | POA: Diagnosis not present

## 2018-12-09 DIAGNOSIS — R05 Cough: Secondary | ICD-10-CM | POA: Diagnosis present

## 2018-12-09 LAB — CBC WITH DIFFERENTIAL/PLATELET
Abs Immature Granulocytes: 0.03 10*3/uL (ref 0.00–0.07)
Basophils Absolute: 0.1 10*3/uL (ref 0.0–0.1)
Basophils Relative: 1 %
Eosinophils Absolute: 0 10*3/uL (ref 0.0–0.5)
Eosinophils Relative: 0 %
HCT: 45.1 % (ref 36.0–46.0)
Hemoglobin: 15.3 g/dL — ABNORMAL HIGH (ref 12.0–15.0)
Immature Granulocytes: 0 %
Lymphocytes Relative: 23 %
Lymphs Abs: 1.7 10*3/uL (ref 0.7–4.0)
MCH: 29.7 pg (ref 26.0–34.0)
MCHC: 33.9 g/dL (ref 30.0–36.0)
MCV: 87.4 fL (ref 80.0–100.0)
Monocytes Absolute: 0.7 10*3/uL (ref 0.1–1.0)
Monocytes Relative: 9 %
Neutro Abs: 4.9 10*3/uL (ref 1.7–7.7)
Neutrophils Relative %: 67 %
Platelets: 273 10*3/uL (ref 150–400)
RBC: 5.16 MIL/uL — ABNORMAL HIGH (ref 3.87–5.11)
RDW: 13 % (ref 11.5–15.5)
WBC: 7.4 10*3/uL (ref 4.0–10.5)
nRBC: 0 % (ref 0.0–0.2)

## 2018-12-09 LAB — COMPREHENSIVE METABOLIC PANEL
ALT: 22 U/L (ref 0–44)
AST: 30 U/L (ref 15–41)
Albumin: 4.1 g/dL (ref 3.5–5.0)
Alkaline Phosphatase: 126 U/L (ref 38–126)
Anion gap: 13 (ref 5–15)
BUN: 21 mg/dL — ABNORMAL HIGH (ref 6–20)
CO2: 30 mmol/L (ref 22–32)
Calcium: 9.5 mg/dL (ref 8.9–10.3)
Chloride: 94 mmol/L — ABNORMAL LOW (ref 98–111)
Creatinine, Ser: 0.83 mg/dL (ref 0.44–1.00)
GFR calc Af Amer: >60 mL/min (ref >60)
GFR calc non Af Amer: >60 mL/min (ref >60)
Glucose, Bld: 105 mg/dL — ABNORMAL HIGH (ref 70–99)
Potassium: 3.3 mmol/L — ABNORMAL LOW (ref 3.5–5.1)
Sodium: 137 mmol/L (ref 135–145)
Total Bilirubin: 0.4 mg/dL (ref 0.3–1.2)
Total Protein: 7.5 g/dL (ref 6.5–8.1)

## 2018-12-09 LAB — BLOOD GAS, ARTERIAL
Acid-Base Excess: 8.9 mmol/L — ABNORMAL HIGH (ref 0.0–2.0)
Bicarbonate: 34 mmol/L — ABNORMAL HIGH (ref 20.0–28.0)
FIO2: 21
O2 Saturation: 98.3 %
Patient temperature: 37
pCO2 arterial: 27.9 mmHg — ABNORMAL LOW (ref 32.0–48.0)
pH, Arterial: 7.648 (ref 7.350–7.450)
pO2, Arterial: 99.7 mmHg (ref 83.0–108.0)

## 2018-12-09 LAB — TROPONIN I (HIGH SENSITIVITY)
Troponin I (High Sensitivity): 37 ng/L — ABNORMAL HIGH (ref <18)
Troponin I (High Sensitivity): 35 ng/L — ABNORMAL HIGH (ref <18)

## 2018-12-09 MED ORDER — ALBUTEROL SULFATE HFA 108 (90 BASE) MCG/ACT IN AERS
2.0000 | INHALATION_SPRAY | Freq: Once | RESPIRATORY_TRACT | Status: AC
  Administered 2018-12-09: 2 via RESPIRATORY_TRACT
  Filled 2018-12-09: qty 6.7

## 2018-12-09 MED ORDER — DOXYCYCLINE HYCLATE 100 MG PO CAPS: 100.0000 mg | ORAL_CAPSULE | Freq: Two times a day (BID) | ORAL | 0 refills | Status: DC

## 2018-12-09 MED ORDER — KETOROLAC TROMETHAMINE 30 MG/ML IJ SOLN
15.0000 mg | Freq: Once | INTRAMUSCULAR | Status: AC
  Administered 2018-12-09: 15 mg via INTRAVENOUS
  Filled 2018-12-09: qty 1

## 2018-12-09 MED ORDER — HYDROCODONE-ACETAMINOPHEN 5-325 MG PO TABS
1.0000 | ORAL_TABLET | Freq: Once | ORAL | Status: AC
  Administered 2018-12-09: 1 via ORAL
  Filled 2018-12-09: qty 1

## 2018-12-09 MED ORDER — IOHEXOL 350 MG/ML SOLN
100.0000 mL | Freq: Once | INTRAVENOUS | Status: AC | PRN
  Administered 2018-12-09: 100 mL via INTRAVENOUS

## 2018-12-09 MED ORDER — SODIUM CHLORIDE 0.9 % IV SOLN
1.0000 g | Freq: Once | INTRAVENOUS | Status: AC
  Administered 2018-12-09: 15:00:00 1 g via INTRAVENOUS
  Filled 2018-12-09: qty 10

## 2018-12-09 MED ORDER — HYDROCODONE-ACETAMINOPHEN 5-325 MG PO TABS: 1.0000 | ORAL_TABLET | ORAL | 0 refills | Status: DC | PRN

## 2018-12-09 MED ORDER — DOXYCYCLINE HYCLATE 100 MG PO TABS
100.0000 mg | ORAL_TABLET | Freq: Once | ORAL | Status: AC
  Administered 2018-12-09: 100 mg via ORAL
  Filled 2018-12-09: qty 1

## 2018-12-09 NOTE — ED Notes (Signed)
Called respiratory. Pt awaiting.

## 2018-12-09 NOTE — ED Triage Notes (Signed)
Pt reports she has had a bad cough for over 5 weeks. Has had multiple visits electronically and was seen here once. States she continues to cough and her back and chest are hurting. Is scheduled for chest CT soon.

## 2018-12-09 NOTE — ED Notes (Signed)
Pt was informed we need urine sample. Water at bedside.

## 2018-12-09 NOTE — ED Notes (Signed)
Date and time results received: 12/09/18 11:36 AM  (use smartphrase ".now" to insert current time)  Test: pH Critical Value: 7.648  Name of Provider Notified: Thurnell Garbe  Orders Received? Or Actions Taken?: Orders Received - See Orders for details

## 2018-12-09 NOTE — Discharge Instructions (Addendum)
Complete the entire course of the antibiotics prescribed, taking your next dose tomorrow morning. Rest and make sure you are drinking plenty of fluids.  It will be important for you to concentrate on taking occasional deep inspirations despite discomfort as this will help your infection clear. You may take the hydrocodone prescribed for pain relief and should also help with frequency of cough.   This will make you drowsy - do not drive within 4 hours of taking this medication.  Use the incentive spirometer as instructed.  Your Covid test is pending at this time but should be resulted within the next 24-36 hours.  You should maintain home isolation until you have received a negative test result.     Person Under Monitoring Name: Jasmine Allen  Location: [place] [address]   Infection Prevention Recommendations for Individuals Confirmed to have, or Being Evaluated for, 2019 Novel Coronavirus (COVID-19) Infection Who Receive Care at Home  Individuals who are confirmed to have, or are being evaluated for, COVID-19 should follow the prevention steps below until a healthcare provider or local or state health department says they can return to normal activities.  Stay home except to get medical care You should restrict activities outside your home, except for getting medical care. Do not go to work, school, or public areas, and do not use public transportation or taxis.  Call ahead before visiting your doctor Before your medical appointment, call the healthcare provider and tell them that you have, or are being evaluated for, COVID-19 infection. This will help the healthcare providers office take steps to keep other people from getting infected. Ask your healthcare provider to call the local or state health department.  Monitor your symptoms Seek prompt medical attention if your illness is worsening (e.g., difficulty breathing). Before going to your  medical appointment, call the healthcare provider and tell them that you have, or are being evaluated for, COVID-19 infection. Ask your healthcare provider to call the local or state health department.  Wear a facemask You should wear a facemask that covers your nose and mouth when you are in the same room with other people and when you visit a healthcare provider. People who live with or visit you should also wear a facemask while they are in the same room with you.  Separate yourself from other people in your home As much as possible, you should stay in a different room from other people in your home. Also, you should use a separate bathroom, if available.  Avoid sharing household items You should not share dishes, drinking glasses, cups, eating utensils, towels, bedding, or other items with other people in your home. After using these items, you should wash them thoroughly with soap and water.  Cover your coughs and sneezes Cover your mouth and nose with a tissue when you cough or sneeze, or you can cough or sneeze into your sleeve. Throw used tissues in a lined trash can, and immediately wash your hands with soap and water for at least 20 seconds or use an alcohol-based hand rub.  Wash your [facility] your hands often and thoroughly with soap and water for at least 20 seconds. You can use an alcohol-based hand sanitizer if soap and water are not available and if your hands are not visibly dirty. Avoid touching your eyes, nose, and mouth with unwashed hands.   Prevention Steps for Caregivers and Household Members of Individuals Confirmed to have, or Being Evaluated for, COVID-19 Infection  Being Cared for in the Home  If you live with, or provide care at home for, a person confirmed to have, or being evaluated for, COVID-19 infection please follow these guidelines to prevent infection:  Follow healthcare providers instructions Make sure that you understand and can help the  patient follow any healthcare provider instructions for all care.  Provide for the patients basic needs You should help the patient with basic needs in the home and provide support for getting groceries, prescriptions, and other personal needs.  Monitor the patients symptoms If they are getting sicker, call his or her medical provider and tell them that the patient has, or is being evaluated for, COVID-19 infection. This will help the healthcare providers office take steps to keep other people from getting infected. Ask the healthcare provider to call the local or state health department.  Limit the number of people who have contact with the patient If possible, have only one caregiver for the patient. Other household members should stay in another home or place of residence. If this is not possible, they should stay in another room, or be separated from the patient as much as possible. Use a separate bathroom, if available. Restrict visitors who do not have an essential need to be in the home.  Keep older adults, very young children, and other sick people away from the patient Keep older adults, very young children, and those who have compromised immune systems or chronic health conditions away from the patient. This includes people with chronic heart, lung, or kidney conditions, diabetes, and cancer.  Ensure good ventilation Make sure that shared spaces in the home have good air flow, such as from an air conditioner or an opened window, weather permitting.  Wash your hands often Wash your hands often and thoroughly with soap and water for at least 20 seconds. You can use an alcohol based hand sanitizer if soap and water are not available and if your hands are not visibly dirty. Avoid touching your eyes, nose, and mouth with unwashed hands. Use disposable paper towels to dry your hands. If not available, use dedicated cloth towels and replace them when they become wet.  Wear a  facemask and gloves Wear a disposable facemask at all times in the room and gloves when you touch or have contact with the patients blood, body fluids, and/or secretions or excretions, such as sweat, saliva, sputum, nasal mucus, vomit, urine, or feces.  Ensure the mask fits over your nose and mouth tightly, and do not touch it during use. Throw out disposable facemasks and gloves after using them. Do not reuse. Wash your hands immediately after removing your facemask and gloves. If your personal clothing becomes contaminated, carefully remove clothing and launder. Wash your hands after handling contaminated clothing. Place all used disposable facemasks, gloves, and other waste in a lined container before disposing them with other household waste. Remove gloves and wash your hands immediately after handling these items.  Do not share dishes, glasses, or other household items with the patient Avoid sharing household items. You should not share dishes, drinking glasses, cups, eating utensils, towels, bedding, or other items with a patient who is confirmed to have, or being evaluated for, COVID-19 infection. After the person uses these items, you should wash them thoroughly with soap and water.  Wash laundry thoroughly Immediately remove and wash clothes or bedding that have blood, body fluids, and/or secretions or excretions, such as sweat, saliva, sputum, nasal mucus, vomit, urine, or feces, on  them. Wear gloves when handling laundry from the patient. Read and follow directions on labels of laundry or clothing items and detergent. In general, wash and dry with the warmest temperatures recommended on the label.  Clean all areas the individual has used often Clean all touchable surfaces, such as counters, tabletops, doorknobs, bathroom fixtures, toilets, phones, keyboards, tablets, and bedside tables, every day. Also, clean any surfaces that may have blood, body fluids, and/or secretions or excretions  on them. Wear gloves when cleaning surfaces the patient has come in contact with. Use a diluted bleach solution (e.g., dilute bleach with 1 part bleach and 10 parts water) or a household disinfectant with a label that says EPA-registered for coronaviruses. To make a bleach solution at home, add 1 tablespoon of bleach to 1 quart (4 cups) of water. For a larger supply, add  cup of bleach to 1 gallon (16 cups) of water. Read labels of cleaning products and follow recommendations provided on product labels. Labels contain instructions for safe and effective use of the cleaning product including precautions you should take when applying the product, such as wearing gloves or eye protection and making sure you have good ventilation during use of the product. Remove gloves and wash hands immediately after cleaning.  Monitor yourself for signs and symptoms of illness Caregivers and household members are considered close contacts, should monitor their health, and will be asked to limit movement outside of the home to the extent possible. Follow the monitoring steps for close contacts listed on the symptom monitoring form.   ? If you have additional questions, contact your local health department or call the epidemiologist on call at [phone] (available 24/7). ? This guidance is subject to change. For the most up-to-date guidance from [facility], please refer to their website: [email or web address]

## 2018-12-09 NOTE — ED Notes (Signed)
Pt ambulatory around unit. O2 sats remain 98-100% on RA.

## 2018-12-10 LAB — NOVEL CORONAVIRUS, NAA (HOSP ORDER, SEND-OUT TO REF LAB; TAT 18-24 HRS): SARS-CoV-2, NAA: NOT DETECTED

## 2018-12-10 NOTE — ED Provider Notes (Signed)
[facility] EMERGENCY DEPARTMENT Provider Note   CSN: [id number] Arrival date & time: 12/09/18  0944     History   Chief Complaint Chief Complaint  Patient presents with   Cough    HPI Jasmine Allen is a 53 y.o. female with a history as outlined below, most significant for anemia, h/o breast cancer in remission, acid reflux with hiatal hernia and HTN presenting with a now 5 week history of a constant nonproductive cough and severe chest and rib cage/sternal pain with cough frequency.  She reports significant shortness of breath and inability to take a deep breath secondary to pain.  She was initially seen here for this one month ago at which time her workup was negative for infection and had a negative d dimer, and was treated for chest wall pain. Additionally has been evaluated by her pcp and trials of carafate/pantoprazole to rule out reflux complication have not been helpful, currently taking tessalon without improvement, also just completed a prednisone taper without improvement.   She is scheduled for an outpatient CT of her chest in 3 days but her pain and become intolerable. Denies fevers or chills, cough nonproductive. No abd pain, no n/v.  No known Covid exposures.     HPI  Past Medical History:  Diagnosis Date   Alopecia, unspecified    Anemia, unspecified    during her pregnancy   Anxiety state, unspecified    Breast cancer ([place]) 2019   Right Breast Cancer   Bulging disc    Depressive disorder, not elsewhere classified    Diverticulosis of colon (without mention of hemorrhage)    CT Scan   Dysrhythmia    PVCs in the past   Esophageal reflux    Esophagitis 1998   Hiatal hernia 1998   History of kidney stones    Hypercholesteremia    Hypertension    Migraine    Opioid abuse, in remission [facility])     Patient Active Problem List   Diagnosis Date Noted   Port-A-Cath in place 10/23/2017   Malignant neoplasm of lower-inner quadrant of right  breast of female, estrogen receptor positive ([place]) 08/23/2017   Chest pain 04/07/2014   Smoking 04/07/2014   ANEMIA, IRON DEFICIENCY 10/20/2009   DEPRESSION 06/15/2009   GERD 06/15/2009   IRRITABLE BOWEL SYNDROME 06/15/2009   RECTAL BLEEDING 06/15/2009    Past Surgical History:  Procedure Laterality Date   ABLATION     BREAST LUMPECTOMY Right 08/16/2017   BREAST LUMPECTOMY WITH AXILLARY LYMPH NODE BIOPSY Right 08/16/2017   Procedure: RIGHT BREAST LUMPECTOMY WITH AXILLARY SENTINEL LYMPH NODE BIOPSY;  Surgeon: Rolm Bookbinder, MD;  Location: [place];  Service: General;  Laterality: Right;   CARDIAC CATHETERIZATION  2011   normal coronaries   KNEE SURGERY     PORTACATH PLACEMENT Right 09/11/2017   Procedure: INSERTION PORT-A-CATH WITH ULTRASOUND;  Surgeon: Rolm Bookbinder, MD;  Location: [place];  Service: General;  Laterality: Right;   RE-EXCISION OF BREAST LUMPECTOMY Right 09/11/2017   Procedure: RE-EXCISION OF BREAST LUMPECTOMY;  Surgeon: Rolm Bookbinder, MD;  Location: [place];  Service: General;  Laterality: Right;   TONSILLECTOMY       OB History   No obstetric history on file.      Home Medications    Prior to Admission medications   Medication Sig Start Date End Date Taking? Authorizing Provider  ACETAMINOPHEN-BUTALBITAL 50-325 MG TABS TAKE 1 TABLET BY MOUTH EVERY DAY AS NEEDED FOR HEADACHE 02/25/18  [provider]  ALPRAZolam Duanne Moron) 0.5 MG tablet Take 0.5 mg by mouth at bedtime as needed for anxiety or sleep.  10/21/13   [provider]  anastrozole (ARIMIDEX) 1 MG tablet Take 1 tablet (1 mg total) by mouth daily. 04/25/18   Nicholas Lose, MD  benzonatate (TESSALON PERLES) 100 MG capsule Take 1 capsule (100 mg total) by mouth 3 (three) times daily as needed. 12/08/18   Hassell Done, Mary-Margaret, FNP  Biotin 10000 MCG TABS Take 10,000 mcg by mouth daily.     [provider]  bumetanide (BUMEX) 1  MG tablet TAKE 1 TABLET(1 MG) BY MOUTH TWICE DAILY 10/31/18   Nicholas Lose, MD  cloNIDine (CATAPRES) 0.3 MG tablet Take 0.3 mg by mouth at bedtime.     [provider]  cyclobenzaprine (FLEXERIL) 10 MG tablet Take 10 mg by mouth every 8 (eight) hours as needed for muscle spasms. 07/16/17   [provider]  doxycycline (VIBRAMYCIN) 100 MG capsule Take 1 capsule (100 mg total) by mouth 2 (two) times daily. 12/09/18   Evalee Jefferson, PA-C  HYDROcodone-acetaminophen (NORCO/VICODIN) 5-325 MG tablet Take 1 tablet by mouth every 4 (four) hours as needed. 12/09/18   Evalee Jefferson, PA-C  hydrocortisone valerate cream (WESTCORT) 0.2 % Apply 1 application topically daily as needed (eczema).  08/21/12   [provider]  hydrOXYzine (ATARAX/VISTARIL) 10 MG tablet Take 10-30 mg by mouth at bedtime as needed for itching.  10/07/15   [provider]  hyoscyamine (LEVSIN SL) 0.125 MG SL tablet dissolve 1 tablet under the tongue every 4 hours if needed Patient taking differently: Take 0.125 mg by mouth every 4 (four) hours as needed for cramping.  10/02/16   Mauri Pole, MD  ibuprofen (ADVIL) 800 MG tablet Take 1 tablet (800 mg total) by mouth every 6 (six) hours as needed for moderate pain. 11/03/18   Orpah Greek, MD  LORazepam (ATIVAN) 0.5 MG tablet Take 1 tablet daily, as needed for nausea. 12/06/18   Nicholas Lose, MD  Melatonin 5 MG CAPS Take 5 mg by mouth at bedtime as needed (for sleep).     [provider]  meloxicam (MOBIC) 7.5 MG tablet Take 1 tablet (7.5 mg total) by mouth 2 (two) times daily. 01/29/18   Nicholas Lose, MD  methocarbamol (ROBAXIN) 500 MG tablet Take 1 tablet (500 mg total) by mouth every 8 (eight) hours as needed for muscle spasms. 11/03/18   Orpah Greek, MD  Multiple Vitamin (MULITIVITAMIN WITH MINERALS) TABS Take 1 tablet by mouth daily.    [provider]  ondansetron (ZOFRAN) 8 MG tablet Take 1 tablet (8 mg total) by  mouth 2 (two) times daily as needed. Start on the third day after chemotherapy. 09/06/17   Nicholas Lose, MD  pantoprazole (PROTONIX) 40 MG tablet Take 1 tablet (40 mg total) by mouth 2 (two) times daily before a meal. Patient taking differently: Take 40 mg by mouth daily.  06/28/16   Mauri Pole, MD  prochlorperazine (COMPAZINE) 10 MG tablet Take 1 tablet (10 mg total) by mouth every 6 (six) hours as needed (Nausea or vomiting). 12/18/17   Nicholas Lose, MD  spironolactone (ALDACTONE) 25 MG tablet Take 25 mg by mouth daily.    [provider]  traZODone (DESYREL) 100 MG tablet Take 100 mg by mouth at bedtime.  10/17/12   [provider]  valACYclovir (VALTREX) 1000 MG tablet Take 1,000 mg by mouth 2 (two) times daily as needed (  for out breaks).     [provider]    Family History Family History  Problem Relation Age of Onset   Heart disease Father        CABG   Breast cancer Mother    Breast cancer Maternal Grandmother    Breast cancer Cousin    Colon cancer Neg Hx    Stomach cancer Neg Hx     Social History Social History   Tobacco Use   Smoking status: Former Smoker    Packs/day: 0.25    Quit date: 08/25/2016    Years since quitting: 2.2   Smokeless tobacco: Never Used  Substance Use Topics   Alcohol use: Yes    Comment: "once a week"   Drug use: No     Allergies   Amoxicillin-pot clavulanate, Erythromycin, Metoclopramide hcl, and Sulfonamide derivatives   Review of Systems Review of Systems  Constitutional: Negative for chills and fever.  HENT: Negative for congestion and sore throat.   Eyes: Negative.   Respiratory: Positive for cough and shortness of breath. Negative for choking, chest tightness, wheezing and stridor.   Cardiovascular: Positive for chest pain. Negative for palpitations and leg swelling.  Gastrointestinal: Negative for abdominal pain, nausea and vomiting.  Genitourinary: Negative.   Musculoskeletal:  Negative for arthralgias, joint swelling and neck pain.  Skin: Negative.  Negative for rash and wound.  Neurological: Negative for dizziness, weakness, light-headedness, numbness and headaches.  Psychiatric/Behavioral: Negative.      Physical Exam Updated Vital Signs BP 128/83    Pulse 69    Temp 98.8 F (37.1 C) (Oral)    Resp 20    Ht 5' (1.524 m)    Wt 49.9 kg    LMP 12/24/2010    SpO2 100%    BMI 21.48 kg/m   Physical Exam Vitals signs and nursing note reviewed.  Constitutional:      General: She is in acute distress.     Appearance: Normal appearance. She is well-developed.     Comments: Appears uncomfortable.  Tearful during exam.  HENT:     Head: Normocephalic and atraumatic.  Eyes:     Conjunctiva/sclera: Conjunctivae normal.  Neck:     Musculoskeletal: Normal range of motion.  Cardiovascular:     Rate and Rhythm: Normal rate and regular rhythm.     Heart sounds: Normal heart sounds.  Pulmonary:     Effort: Pulmonary effort is normal. Tachypnea present. No accessory muscle usage, prolonged expiration or retractions.     Breath sounds: Decreased air movement present. Decreased breath sounds present. No wheezing, rhonchi or rales.     Comments: Poor effort.  Tearful during attempts at deep inspiration. Abdominal:     General: Bowel sounds are normal.     Palpations: Abdomen is soft.     Tenderness: There is no abdominal tenderness.  Musculoskeletal: Normal range of motion.  Skin:    General: Skin is warm and dry.  Neurological:     Mental Status: She is alert.      ED Treatments / Results  Labs (all labs ordered are listed, but only abnormal results are displayed) Labs Reviewed  COMPREHENSIVE METABOLIC PANEL - Abnormal; Notable for the following components:      Result Value   Potassium 3.3 (*)    Chloride 94 (*)    Glucose, Bld 105 (*)    BUN 21 (*)    All other components within normal limits  CBC WITH DIFFERENTIAL/PLATELET - Abnormal; Notable for  the  following components:   RBC 5.16 (*)    Hemoglobin 15.3 (*)    All other components within normal limits  BLOOD GAS, ARTERIAL - Abnormal; Notable for the following components:   pH, Arterial 7.648 (*)    pCO2 arterial 27.9 (*)    Bicarbonate 34.0 (*)    Acid-Base Excess 8.9 (*)    All other components within normal limits  TROPONIN I (HIGH SENSITIVITY) - Abnormal; Notable for the following components:   Troponin I (High Sensitivity) 37 (*)    All other components within normal limits  TROPONIN I (HIGH SENSITIVITY) - Abnormal; Notable for the following components:   Troponin I (High Sensitivity) 35 (*)    All other components within normal limits  NOVEL CORONAVIRUS, NAA (HOSP ORDER, SEND-OUT TO REF LAB; TAT 18-24 HRS)    EKG EKG Interpretation  Date/Time:  Monday December 09 2018 11:02:34 EDT Ventricular Rate:  75 PR Interval:    QRS Duration: 89 QT Interval:  412 QTC Calculation: 461 R Axis:   59 Text Interpretation:  Sinus rhythm Borderline short PR interval LAE, consider biatrial enlargement When compared with ECG of 11/03/2018 No significant change was found Confirmed by Francine Graven [phone]) on 12/09/2018 12:39:33 PM   Radiology Ct Angio Chest Pe W And/or Wo Contrast  Result Date: 12/09/2018 CLINICAL DATA:  Complex chest pain over the last 5 weeks. Shortness of breath. EXAM: CT ANGIOGRAPHY CHEST WITH CONTRAST TECHNIQUE: Multidetector CT imaging of the chest was performed using the standard protocol during bolus administration of intravenous contrast. Multiplanar CT image reconstructions and MIPs were obtained to evaluate the vascular anatomy. CONTRAST:  145mL OMNIPAQUE IOHEXOL 350 MG/ML SOLN COMPARISON:  Radiography 11/22/2018 FINDINGS: Cardiovascular: Pulmonary arterial opacification is good. There are no pulmonary emboli. Systemic arterial opacification is good. No aortic atherosclerosis is seen. No aneurysm or dissection. Heart size is within normal limits. No visible  coronary artery calcification. Mediastinum/Nodes: No mass or lymphadenopathy. Lungs/Pleura: There are numerous foci of patchy infiltrate in the upper lobes, more extensive on the right than the left. There is right middle lobe patchy bronchopneumonia. Right lower lobe is clear. Left lower lobe is clear. No pleural effusion. Upper Abdomen: Normal Musculoskeletal: There are scattered sclerotic foci within the spine, most notable at the T1 level. No destructive lesions. The appearance is nonspecific. In a patient with a history of breast cancer bone metastases are not excluded. Review of the MIP images confirms the above findings. IMPRESSION: Patchy infiltrates consistent with bronchopneumonia. This could be bacterial or viral. Most pronounced involvement in the right middle lobe. Bilateral upper lobe involvement, right more than left. Scattered sclerotic foci within the vertebral bodies. These are nonspecific, but in a patient with a history of breast cancer do raise some concern for bone metastases. There is no evidence vascular disease. Electronically Signed   By: Nelson Chimes M.D.   On: 12/09/2018 13:53    Procedures Procedures (including critical care time)  Medications Ordered in ED Medications  albuterol (VENTOLIN HFA) 108 (90 Base) MCG/ACT inhaler 2 puff (2 puffs Inhalation Given 12/09/18 1122)  iohexol (OMNIPAQUE) 350 MG/ML injection 100 mL (100 mLs Intravenous Contrast Given 12/09/18 1324)  cefTRIAXone (ROCEPHIN) 1 g in sodium chloride 0.9 % 100 mL IVPB (0 g Intravenous Stopped 12/09/18 1517)  doxycycline (VIBRA-TABS) tablet 100 mg (100 mg Oral Given 12/09/18 1445)  ketorolac (TORADOL) 30 MG/ML injection 15 mg (15 mg Intravenous Given 12/09/18 1446)  HYDROcodone-acetaminophen (NORCO/VICODIN) 5-325 MG per tablet 1 tablet (1  tablet Oral Given 12/09/18 1645)     Initial Impression / Assessment and Plan / ED Course  I have reviewed the triage vital signs and the nursing notes.  Pertinent labs &  imaging results that were available during my care of the patient were reviewed by me and considered in my medical decision making (see chart for details).        Labs and imaging reviewed and interpreted. Labs stable, CT negative for PE but with scattered pneumonia.  She was given IV rocephin, doxycycline prescribed for home use.   Albuterol mdi also tried with some improvement in frequency of cough.  Still with persistent chest wall pain, minimally improved with toradol IV.  Pt was prescribed a small quantity of hydrocodone for cough suppression and for chest wall pain, which is felt necessary to help her take deep breaths while she recovers from this infection.  Incentive spirometer also given.  Review of CT also suggesting sclerotic bony nonspecific findings of unclear etiology, cannot rule out bony mets.  This information was forwarded to her oncologist Dr. Lindi Adie for further advisement.  Final Clinical Impressions(s) / ED Diagnoses   Final diagnoses:  Community acquired pneumonia of right lung, unspecified part of lung    ED Discharge Orders         Ordered    doxycycline (VIBRAMYCIN) 100 MG capsule  2 times daily     12/09/18 1638    HYDROcodone-acetaminophen (NORCO/VICODIN) 5-325 MG tablet  Every 4 hours PRN     12/09/18 1638           Evalee Jefferson, PA-C 12/11/18 [place]    Nat Christen, MD 12/11/18 [phone]

## 2018-12-11 ENCOUNTER — Other Ambulatory Visit: Payer: Self-pay | Admitting: Hematology and Oncology

## 2018-12-11 DIAGNOSIS — Z170 Estrogen receptor positive status [ER+]: Secondary | ICD-10-CM

## 2018-12-11 DIAGNOSIS — C50311 Malignant neoplasm of lower-inner quadrant of right female breast: Secondary | ICD-10-CM

## 2018-12-12 ENCOUNTER — Other Ambulatory Visit: Payer: 59

## 2018-12-16 ENCOUNTER — Other Ambulatory Visit: Payer: Self-pay

## 2018-12-16 ENCOUNTER — Emergency Department (HOSPITAL_COMMUNITY)
Admission: EM | Admit: 2018-12-16 | Discharge: 2018-12-16 | Disposition: A | Discharge status: Home / Self Care | Payer: 59 | Attending: Emergency Medicine | Admitting: Emergency Medicine

## 2018-12-16 ENCOUNTER — Emergency Department (HOSPITAL_COMMUNITY): Payer: 59

## 2018-12-16 ENCOUNTER — Encounter (HOSPITAL_COMMUNITY): Payer: Self-pay | Admitting: Emergency Medicine

## 2018-12-16 DIAGNOSIS — Z853 Personal history of malignant neoplasm of breast: Secondary | ICD-10-CM | POA: Insufficient documentation

## 2018-12-16 DIAGNOSIS — Z87891 Personal history of nicotine dependence: Secondary | ICD-10-CM | POA: Diagnosis not present

## 2018-12-16 DIAGNOSIS — J189 Pneumonia, unspecified organism: Secondary | ICD-10-CM | POA: Diagnosis not present

## 2018-12-16 DIAGNOSIS — Z79899 Other long term (current) drug therapy: Secondary | ICD-10-CM | POA: Insufficient documentation

## 2018-12-16 DIAGNOSIS — R05 Cough: Secondary | ICD-10-CM | POA: Diagnosis present

## 2018-12-16 DIAGNOSIS — I10 Essential (primary) hypertension: Secondary | ICD-10-CM | POA: Diagnosis not present

## 2018-12-16 LAB — CBC WITH DIFFERENTIAL/PLATELET
Abs Immature Granulocytes: 0.03 10*3/uL (ref 0.00–0.07)
Basophils Absolute: 0 10*3/uL (ref 0.0–0.1)
Basophils Relative: 0 %
Eosinophils Absolute: 0.1 10*3/uL (ref 0.0–0.5)
Eosinophils Relative: 1 %
HCT: 40.2 % (ref 36.0–46.0)
Hemoglobin: 13.5 g/dL (ref 12.0–15.0)
Immature Granulocytes: 1 %
Lymphocytes Relative: 24 %
Lymphs Abs: 1.5 10*3/uL (ref 0.7–4.0)
MCH: 29.7 pg (ref 26.0–34.0)
MCHC: 33.6 g/dL (ref 30.0–36.0)
MCV: 88.4 fL (ref 80.0–100.0)
Monocytes Absolute: 0.5 10*3/uL (ref 0.1–1.0)
Monocytes Relative: 8 %
Neutro Abs: 4.1 10*3/uL (ref 1.7–7.7)
Neutrophils Relative %: 66 %
Platelets: 180 10*3/uL (ref 150–400)
RBC: 4.55 MIL/uL (ref 3.87–5.11)
RDW: 13.3 % (ref 11.5–15.5)
WBC: 6.2 10*3/uL (ref 4.0–10.5)
nRBC: 0 % (ref 0.0–0.2)

## 2018-12-16 LAB — BASIC METABOLIC PANEL
Anion gap: 12 (ref 5–15)
BUN: 20 mg/dL (ref 6–20)
CO2: 25 mmol/L (ref 22–32)
Calcium: 9.2 mg/dL (ref 8.9–10.3)
Chloride: 104 mmol/L (ref 98–111)
Creatinine, Ser: 0.77 mg/dL (ref 0.44–1.00)
GFR calc Af Amer: >60 mL/min (ref >60)
GFR calc non Af Amer: >60 mL/min (ref >60)
Glucose, Bld: 83 mg/dL (ref 70–99)
Potassium: 4.1 mmol/L (ref 3.5–5.1)
Sodium: 141 mmol/L (ref 135–145)

## 2018-12-16 MED ORDER — LEVOFLOXACIN 500 MG PO TABS: 750.0000 mg | ORAL_TABLET | Freq: Every day | ORAL | 0 refills | Status: AC

## 2018-12-16 NOTE — ED Notes (Signed)
MD Trifan at bedside

## 2018-12-16 NOTE — Discharge Instructions (Signed)
As we discussed, you should continue taking your doxycycline and watching your symptoms at home.  You can have a lingering cough for up to 4 weeks after being treated with pneumonia.  If you continue to feel short of breath, or begin having fevers, you can begin taking your Levaquin antibiotic after completing the doxycycline.  You should call your primary care doctor and let them know that you are doing this.  We talked about the risks of taking this kind of antibiotic, including injury to your tendons.

## 2018-12-16 NOTE — ED Notes (Signed)
Pt transported to xray 

## 2018-12-16 NOTE — ED Provider Notes (Signed)
[facility] DEPT Provider Note   CSN: [id number] Arrival date & time: 12/16/18  1312     History   Chief Complaint Chief Complaint  Patient presents with  . Pneumonia  . Cough    HPI Jasmine Allen is a 53 y.o. female with a past medical history of breast cancer in remission, with her last chemo and radiation in February 2020, and recent diagnosis for right lobar pneumonia approximately 8 days ago in our emergency department, presenting back to the emergency department complaining of cough and continued shortness of breath.  Patient reports onset of her symptoms approximately 5 or 6 weeks ago after a mammogram.  She developed persistent chest pain and coughing during this mammogram, which led to her visit to the ED on August 31.  At that time she underwent a PE evaluation including CT angiogram, which showed no signs of PE but did show patchy infiltrates consistent with bronchopneumonia.  She received a dose of ceftriaxone and was sent home on a 10 day course of doxycycline, which she has been taking consistently for the past week (she has 3 more days).    She believes her chest pain is gradually been improving, but reports continued shortness of breath and persistent coughing.  Her coughing is worse at night.  She denies any productive sputum and says her cough is dry.  She has not been taking any medications for the cough.  Of note, on her last ED visit, she was also found to have sclerotic lesions of the vertebral bodies on her CT scan.  She has followed up and discussed this with Dr. Sandria Senter, her oncologist, and she has a PET scan scheduled this week.   HPI  Past Medical History:  Diagnosis Date  . Alopecia, unspecified   . Anemia, unspecified    during her pregnancy  . Anxiety state, unspecified   . Breast cancer ([place]) 2019   Right Breast Cancer  . Bulging disc   . Depressive disorder, not elsewhere classified   . Diverticulosis of colon (without  mention of hemorrhage)    CT Scan  . Dysrhythmia    PVCs in the past  . Esophageal reflux   . Esophagitis 1998  . Hiatal hernia 1998  . History of kidney stones   . Hypercholesteremia   . Hypertension   . Migraine   . Opioid abuse, in remission [facility])     Patient Active Problem List   Diagnosis Date Noted  . Port-A-Cath in place 10/23/2017  . Malignant neoplasm of lower-inner quadrant of right breast of female, estrogen receptor positive ([place]) 08/23/2017  . Chest pain 04/07/2014  . Smoking 04/07/2014  . ANEMIA, IRON DEFICIENCY 10/20/2009  . DEPRESSION 06/15/2009  . GERD 06/15/2009  . IRRITABLE BOWEL SYNDROME 06/15/2009  . RECTAL BLEEDING 06/15/2009    Past Surgical History:  Procedure Laterality Date  . ABLATION    . BREAST LUMPECTOMY Right 08/16/2017  . BREAST LUMPECTOMY WITH AXILLARY LYMPH NODE BIOPSY Right 08/16/2017   Procedure: RIGHT BREAST LUMPECTOMY WITH AXILLARY SENTINEL LYMPH NODE BIOPSY;  Surgeon: Rolm Bookbinder, MD;  Location: [facility];  Service: General;  Laterality: Right;  . CARDIAC CATHETERIZATION  2011   normal coronaries  . KNEE SURGERY    . PORTACATH PLACEMENT Right 09/11/2017   Procedure: INSERTION PORT-A-CATH WITH ULTRASOUND;  Surgeon: Rolm Bookbinder, MD;  Location: [facility];  Service: General;  Laterality: Right;  . RE-EXCISION OF BREAST LUMPECTOMY Right 09/11/2017   Procedure: RE-EXCISION  OF BREAST LUMPECTOMY;  Surgeon: Rolm Bookbinder, MD;  Location: [facility];  Service: General;  Laterality: Right;  . TONSILLECTOMY       OB History   No obstetric history on file.      Home Medications    Prior to Admission medications   Medication Sig Start Date End Date Taking? Authorizing Provider  ALPRAZolam Duanne Moron) 0.5 MG tablet Take 0.5 mg by mouth at bedtime as needed for anxiety or sleep.  10/21/13  Yes [provider]  atorvastatin (LIPITOR) 20 MG tablet Take 20 mg by mouth daily. 11/27/18  Yes [provider]  Biotin 10000 MCG TABS Take 10,000 mcg by mouth daily.    Yes [provider]  bumetanide (BUMEX) 1 MG tablet TAKE 1 TABLET(1 MG) BY MOUTH TWICE DAILY 10/31/18  Yes Nicholas Lose, MD  cetirizine (ZYRTEC) 10 MG tablet Take 10 mg by mouth daily.   Yes [provider]  cloNIDine (CATAPRES) 0.3 MG tablet Take 0.3 mg by mouth at bedtime.    Yes [provider]  cyclobenzaprine (FLEXERIL) 10 MG tablet Take 10 mg by mouth every 8 (eight) hours as needed for muscle spasms. 07/16/17  Yes [provider]  doxycycline (VIBRAMYCIN) 100 MG capsule Take 1 capsule (100 mg total) by mouth 2 (two) times daily. 12/09/18  Yes Idol, Almyra Free, PA-C  HYDROcodone-acetaminophen (NORCO/VICODIN) 5-325 MG tablet Take 1 tablet by mouth every 4 (four) hours as needed. 12/09/18  Yes Idol, Almyra Free, PA-C  HYDROcodone-homatropine (HYCODAN) 5-1.5 MG/5ML syrup Take 5 mLs by mouth every 8 (eight) hours. 12/11/18 12/21/18 Yes [provider]  hydrOXYzine (ATARAX/VISTARIL) 10 MG tablet Take 10-30 mg by mouth at bedtime as needed for itching.  10/07/15  Yes [provider]  hyoscyamine (LEVSIN SL) 0.125 MG SL tablet dissolve 1 tablet under the tongue every 4 hours if needed Patient taking differently: Take 0.125 mg by mouth every 4 (four) hours as needed for cramping.  10/02/16  Yes Nandigam, Venia Minks, MD  ibuprofen (ADVIL) 800 MG tablet Take 1 tablet (800 mg total) by mouth every 6 (six) hours as needed for moderate pain. 11/03/18  Yes Pollina, Gwenyth Allegra, MD  LORazepam (ATIVAN) 0.5 MG tablet Take 1 tablet daily, as needed for nausea. 12/06/18  Yes Nicholas Lose, MD  metaxalone (SKELAXIN) 800 MG tablet Take 800 mg by mouth 3 (three) times daily. 12/03/18  Yes [provider]  methocarbamol (ROBAXIN) 500 MG tablet Take 1 tablet (500 mg total) by mouth every 8 (eight) hours as needed for muscle spasms. 11/03/18  Yes Pollina, Gwenyth Allegra, MD  Multiple Vitamin (MULITIVITAMIN  WITH MINERALS) TABS Take 1 tablet by mouth daily.   Yes [provider]  pantoprazole (PROTONIX) 40 MG tablet Take 1 tablet (40 mg total) by mouth 2 (two) times daily before a meal. Patient taking differently: Take 40 mg by mouth daily.  06/28/16  Yes Nandigam, Venia Minks, MD  traZODone (DESYREL) 100 MG tablet Take 100 mg by mouth at bedtime.  10/17/12  Yes [provider]  valACYclovir (VALTREX) 1000 MG tablet Take 1,000 mg by mouth 2 (two) times daily as needed (for out breaks).    Yes [provider]  anastrozole (ARIMIDEX) 1 MG tablet Take 1 tablet (1 mg total) by mouth daily. Patient not taking: Reported on 12/16/2018 04/25/18   Nicholas Lose, MD  benzonatate (TESSALON PERLES) 100 MG capsule Take 1 capsule (100 mg total) by mouth 3 (three) times daily as needed. Patient  not taking: Reported on 12/16/2018 12/08/18   Chevis Pretty, FNP  levofloxacin (LEVAQUIN) 500 MG tablet Take 1.5 tablets (750 mg total) by mouth daily for 5 days. 12/16/18 12/21/18  Wyvonnia Dusky, MD  meloxicam (MOBIC) 7.5 MG tablet Take 1 tablet (7.5 mg total) by mouth 2 (two) times daily. Patient not taking: Reported on 12/16/2018 01/29/18   Nicholas Lose, MD  ondansetron (ZOFRAN) 8 MG tablet Take 1 tablet (8 mg total) by mouth 2 (two) times daily as needed. Start on the third day after chemotherapy. Patient not taking: Reported on 12/16/2018 09/06/17   Nicholas Lose, MD  prochlorperazine (COMPAZINE) 10 MG tablet Take 1 tablet (10 mg total) by mouth every 6 (six) hours as needed (Nausea or vomiting). Patient not taking: Reported on 12/16/2018 12/18/17   Nicholas Lose, MD    Family History Family History  Problem Relation Age of Onset  . Heart disease Father        CABG  . Breast cancer Mother   . Breast cancer Maternal Grandmother   . Breast cancer Cousin   . Colon cancer Neg Hx   . Stomach cancer Neg Hx     Social History Social History   Tobacco Use  . Smoking status: Former Smoker     Packs/day: 0.25    Quit date: 08/25/2016    Years since quitting: 2.3  . Smokeless tobacco: Never Used  Substance Use Topics  . Alcohol use: Yes    Comment: "once a week"  . Drug use: No     Allergies   Amoxicillin-pot clavulanate, Erythromycin, Metoclopramide hcl, and Sulfonamide derivatives   Review of Systems Review of Systems  Constitutional: Negative for chills and fever.  HENT: Negative for ear pain and sore throat.   Eyes: Negative for pain.  Respiratory: Positive for cough, chest tightness and shortness of breath. Negative for choking.   Cardiovascular: Positive for chest pain. Negative for palpitations and leg swelling.  Gastrointestinal: Negative for abdominal pain, nausea and vomiting.  Musculoskeletal: Negative for arthralgias and back pain.  Skin: Negative for pallor and rash.  Neurological: Negative for seizures and syncope.  All other systems reviewed and are negative.    Physical Exam Updated Vital Signs BP 105/64   Pulse 71   Temp 98.3 F (36.8 C)   Resp (!) 22   LMP 12/24/2010   SpO2 95%   Physical Exam Vitals signs and nursing note reviewed.  Constitutional:      General: She is not in acute distress.    Appearance: She is well-developed.  HENT:     Head: Normocephalic and atraumatic.  Eyes:     Conjunctiva/sclera: Conjunctivae normal.  Neck:     Musculoskeletal: Neck supple.  Cardiovascular:     Rate and Rhythm: Normal rate and regular rhythm.     Heart sounds: No murmur.  Pulmonary:     Effort: Pulmonary effort is normal. No respiratory distress.     Breath sounds: Normal breath sounds. No decreased breath sounds, wheezing or rhonchi.     Comments: 99% on room air Speaking comfortably Abdominal:     Palpations: Abdomen is soft.     Tenderness: There is no abdominal tenderness.  Skin:    General: Skin is warm and dry.  Neurological:     Mental Status: She is alert.      ED Treatments / Results  Labs (all labs ordered are  listed, but only abnormal results are displayed) Labs Reviewed  BASIC METABOLIC PANEL  CBC WITH DIFFERENTIAL/PLATELET    EKG None  Radiology Dg Chest 2 View  Result Date: 12/16/2018 CLINICAL DATA:  Shortness of breath. Right-sided pneumonia diagnosed 1 week ago. No fever. Smoker. EXAM: CHEST - 2 VIEW COMPARISON:  Chest radiographs dated 11/22/2018 and chest CT dated 12/09/2018. FINDINGS: Normal sized heart. Mild patchy opacity in the right middle lobe with an appearance similar to that seen on 12/09/2018. Mild diffuse peribronchial thickening and accentuation of the interstitial markings. Mild to moderate scoliosis. No pleural fluid. IMPRESSION: 1. Mild right middle lobe pneumonia without significant change. 2. Mild chronic bronchitic changes. Electronically Signed   By: Claudie Revering M.D.   On: 12/16/2018 14:49    Procedures Procedures (including critical care time)  Medications Ordered in ED Medications - No data to display   Initial Impression / Assessment and Plan / ED Course  I have reviewed the triage vital signs and the nursing notes.  Pertinent labs & imaging results that were available during my care of the patient were reviewed by me and considered in my medical decision making (see chart for details).  53 yo female w/ breast cancer in remission, pending future workup for sclerotic bone lesions, presenting to the ED with persistent SOB and Cough after diagnosis and tx for PNA last week.  She had a thorough ED evaluation last week including CT angiogram which demonstrated right sided PNA.  She was discharged on doxycycline, but has persistent dry cough and sharp chest pain only during coughing fits.   She otherwise appears comfortable and well clinically on exam.  She has no signs of respiratory dsitress.  She has no tachycardia or hypoxia to suggest new PE.  I do not suspect new PE given her symptoms have been persistent since her last ED evaluation.  Likewise, I have a low  suspicion for ACS at this time.   It is possible her PNA was inadequately treated with doxycycline. We will check CBC and CXR here.  Her covid testing was negative last week. She does not appear to have sepsis.  Clinical Course as of Dec 15 1840  Mon Dec 16, 2018  1521 WBC 6.2   [MT]  1549 Reviewed imaging and findings with patient, explained to clinically it does not appear that she has a new or worsening pneumonia, however she still does have a persistent right middle lobe infiltrate.  Discussed the possibility that her outpatient management on doxycycline is not working, versus the fact that it is working and she is simply dealing with persistent postinfectious cough, which should resolve in the next week or 2.  After shared medical decision making, we decided that I will provide her with a paper script for levaquin.  If she continues to feel SOB or develop productive cough after completing doxcycyline, she can proceed to taking Levaquin for 5 days.  We discussed the risks of this medication, including tendon rupture.  I advised her to call her primary care doctor to make a decision to proceed to these antibiotics in conjunction with her doctor.  She reports that she would, and that she has a PET scan scheduled within a week.   [MT]    Clinical Course User Index [MT] Meera Vasco, Carola Rhine, MD       Final Clinical Impressions(s) / ED Diagnoses   Final diagnoses:  Community acquired pneumonia of right middle lobe of lung [facility])    ED Discharge Orders         Ordered    levofloxacin (  LEVAQUIN) 500 MG tablet  Daily     12/16/18 1552           Wyvonnia Dusky, MD 12/16/18 1842

## 2018-12-16 NOTE — ED Triage Notes (Signed)
Patient reports dx with pneumonia x1 week ago. States taking oral abx as prescribed without relief. Reports continued SOB on exertion. Denies fevers.

## 2018-12-16 NOTE — ED Notes (Signed)
Discharge instructions reviewed with patient. Patient verbalizes understanding. VSS.   

## 2018-12-17 ENCOUNTER — Other Ambulatory Visit: Payer: 59

## 2018-12-20 ENCOUNTER — Other Ambulatory Visit: Payer: Self-pay

## 2018-12-20 ENCOUNTER — Encounter (HOSPITAL_COMMUNITY)
Admission: RE | Admit: 2018-12-20 | Discharge: 2018-12-20 | Disposition: A | Discharge status: Home / Self Care | Payer: 59 | Source: Ambulatory Visit | Attending: Hematology and Oncology | Admitting: Hematology and Oncology

## 2018-12-20 DIAGNOSIS — C50311 Malignant neoplasm of lower-inner quadrant of right female breast: Secondary | ICD-10-CM | POA: Diagnosis present

## 2018-12-20 DIAGNOSIS — Z170 Estrogen receptor positive status [ER+]: Secondary | ICD-10-CM | POA: Diagnosis present

## 2018-12-20 MED ORDER — TECHNETIUM TC 99M MEDRONATE IV KIT
21.7000 | PACK | Freq: Once | INTRAVENOUS | Status: AC | PRN
  Administered 2018-12-20: 21.7 via INTRAVENOUS

## 2018-12-22 NOTE — Progress Notes (Signed)
Patient Care Team: Aletha Halim., PA-C as PCP - General (Family Medicine) Nicholas Lose, MD as Consulting Physician (Hematology and Oncology) Kyung Rudd, MD as Consulting Physician (Radiation Oncology) Rolm Bookbinder, MD as Consulting Physician (General Surgery)  DIAGNOSIS:    ICD-10-CM   1. Malignant neoplasm of lower-inner quadrant of right breast of female, estrogen receptor positive ([place])  C50.311    Z17.0     SUMMARY OF ONCOLOGIC HISTORY: Oncology History  Malignant neoplasm of lower-inner quadrant of right breast of female, estrogen receptor positive ([place])  08/16/2017 Initial Diagnosis   Right lumpectomy: 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  High risk   08/16/2017 Cancer Staging   Staging form: Breast, AJCC 8th Edition - Pathologic stage from 08/16/2017: Stage IA (pT1c, pN0, cM0, G2, ER+, PR+, HER2-, Oncotype DX score: 31) - Signed by Gardenia Phlegm, NP on 09/12/2018   09/25/2017 - 02/05/2018 Chemotherapy   Adjuvant chemotherapy with dose dense Adriamycin and Cytoxan x4 followed by Taxol weekly x12    03/11/2018 - 04/25/2018 Radiation Therapy   Adjuvant XRT   05/2018 -  Anti-estrogen oral therapy   Anastrozole daily, plan for 7 years     CHIEF COMPLIANT: Follow-up of right breast cancer on anastrozole  INTERVAL HISTORY: Jasmine Allen is a 53 y.o. with above-mentioned history of right breast cancer treated with lumpectomy, adjuvant chemotherapy, radiation, and who is currently on anti-estrogen therapy with anastrozole. Mammogram on 10/30/18 showed an indeterminate 2.2cm group of calcifications in the right breast. Biopsy on 10/31/18 showed fibrocystic changes, negative for carcinoma. Bone scan on 12/20/18 after the patient  experienced rib pain showed no evidence of skeletal metastases. She was seen in the [facility] ED on 12/16/18 for pneumonia. She presents to the clinic today for follow-up.  She complains of right central chest wall pain she does not think there is any pain in the breast.  When she was in the emergency room she had a troponin I but was elevated and no further work-up was performed at that time.  She has a cardiologist Dr. Johnsie Cancel.  I sent a message to him today.  She has bilateral lung infiltrates without any fevers.  She is been on doxycycline and now on levofloxacin.  REVIEW OF SYSTEMS:   Constitutional: Denies fevers, chills or abnormal weight loss Eyes: Denies blurriness of vision Ears, nose, mouth, throat, and face: Denies mucositis or sore throat Respiratory: Cough and sternal pain Cardiovascular: Denies palpitation, chest discomfort Gastrointestinal: Denies nausea, heartburn or change in bowel habits Skin: Denies abnormal skin rashes Lymphatics: Denies new lymphadenopathy or easy bruising Neurological: Denies numbness, tingling or new weaknesses Behavioral/Psych: Mood is stable, no new changes  Extremities: No lower extremity edema Breast: denies any pain or lumps or nodules in either breasts All other systems were reviewed with the patient and are negative.  I have reviewed the past medical history, past surgical history, social history and family history with the patient and they are unchanged from previous note.  ALLERGIES:  is allergic to amoxicillin-pot clavulanate; erythromycin; metoclopramide hcl; and sulfonamide derivatives.  MEDICATIONS:  Current Outpatient Medications  Medication Sig Dispense Refill  . ALPRAZolam (XANAX) 0.5 MG tablet Take 0.5 mg by mouth at bedtime as needed for  anxiety or sleep.     Marland Kitchen anastrozole (ARIMIDEX) 1 MG tablet Take 1 tablet (1 mg total) by mouth daily. (Patient not taking: Reported on 12/16/2018) 90 tablet 3  . atorvastatin (LIPITOR) 20 MG tablet  Take 20 mg by mouth daily.    . benzonatate (TESSALON PERLES) 100 MG capsule Take 1 capsule (100 mg total) by mouth 3 (three) times daily as needed. (Patient not taking: Reported on 12/16/2018) 20 capsule 0  . Biotin 10000 MCG TABS Take 10,000 mcg by mouth daily.     . bumetanide (BUMEX) 1 MG tablet TAKE 1 TABLET(1 MG) BY MOUTH TWICE DAILY 60 tablet 2  . cetirizine (ZYRTEC) 10 MG tablet Take 10 mg by mouth daily.    . cloNIDine (CATAPRES) 0.3 MG tablet Take 0.3 mg by mouth at bedtime.     . cyclobenzaprine (FLEXERIL) 10 MG tablet Take 10 mg by mouth every 8 (eight) hours as needed for muscle spasms.  1  . doxycycline (VIBRAMYCIN) 100 MG capsule Take 1 capsule (100 mg total) by mouth 2 (two) times daily. 20 capsule 0  . HYDROcodone-acetaminophen (NORCO/VICODIN) 5-325 MG tablet Take 1 tablet by mouth every 4 (four) hours as needed. 15 tablet 0  . hydrOXYzine (ATARAX/VISTARIL) 10 MG tablet Take 10-30 mg by mouth at bedtime as needed for itching.   0  . hyoscyamine (LEVSIN SL) 0.125 MG SL tablet dissolve 1 tablet under the tongue every 4 hours if needed (Patient taking differently: Take 0.125 mg by mouth every 4 (four) hours as needed for cramping. ) 30 tablet 0  . ibuprofen (ADVIL) 800 MG tablet Take 1 tablet (800 mg total) by mouth every 6 (six) hours as needed for moderate pain. 20 tablet 0  . LORazepam (ATIVAN) 0.5 MG tablet Take 1 tablet daily, as needed for nausea. 30 tablet 1  . meloxicam (MOBIC) 7.5 MG tablet Take 1 tablet (7.5 mg total) by mouth 2 (two) times daily. (Patient not taking: Reported on 12/16/2018) 60 tablet 2  . metaxalone (SKELAXIN) 800 MG tablet Take 800 mg by mouth 3 (three) times daily.    . methocarbamol (ROBAXIN) 500 MG tablet Take 1 tablet (500 mg total) by mouth every 8 (eight) hours as needed for muscle spasms. 20 tablet 0  . Multiple Vitamin (MULITIVITAMIN WITH MINERALS) TABS Take 1 tablet by mouth daily.    . ondansetron (ZOFRAN) 8 MG tablet Take 1 tablet (8 mg total) by  mouth 2 (two) times daily as needed. Start on the third day after chemotherapy. (Patient not taking: Reported on 12/16/2018) 30 tablet 1  . pantoprazole (PROTONIX) 40 MG tablet Take 1 tablet (40 mg total) by mouth 2 (two) times daily before a meal. (Patient taking differently: Take 40 mg by mouth daily. ) 60 tablet 6  . prochlorperazine (COMPAZINE) 10 MG tablet Take 1 tablet (10 mg total) by mouth every 6 (six) hours as needed (Nausea or vomiting). (Patient not taking: Reported on 12/16/2018) 30 tablet 1  . traZODone (DESYREL) 100 MG tablet Take 100 mg by mouth at bedtime.     . valACYclovir (VALTREX) 1000 MG tablet Take 1,000 mg by mouth 2 (two) times daily as needed (for out breaks).      No current facility-administered medications for this visit.     PHYSICAL EXAMINATION: ECOG PERFORMANCE STATUS: 1 - Symptomatic but completely ambulatory  Vitals:   12/23/18 1506  BP: 113/77  Pulse: 92  Resp: 18  Temp: 98.7 F (37.1 C)  SpO2: 100%  Filed Weights   12/23/18 1506  Weight: 112 lb 9.6 oz (51.1 kg)    GENERAL: alert, no distress and comfortable SKIN: skin color, texture, turgor are normal, no rashes or significant lesions EYES: normal, Conjunctiva are pink and non-injected, sclera clear OROPHARYNX: no exudate, no erythema and lips, buccal mucosa, and tongue normal  NECK: supple, thyroid normal size, non-tender, without nodularity LYMPH: no palpable lymphadenopathy in the cervical, axillary or inguinal LUNGS: clear to auscultation and percussion with normal breathing effort HEART: regular rate & rhythm and no murmurs and no lower extremity edema ABDOMEN: abdomen soft, non-tender and normal bowel sounds MUSCULOSKELETAL: no cyanosis of digits and no clubbing  NEURO: alert & oriented x 3 with fluent speech, no focal motor/sensory deficits EXTREMITIES: No lower extremity edema  LABORATORY DATA:  I have reviewed the data as listed CMP Latest Ref Rng & Units 12/16/2018 12/09/2018 11/03/2018   Glucose 70 - 99 mg/dL 83 105(H) 129(H)  BUN 6 - 20 mg/dL 20 21(H) 20  Creatinine 0.44 - 1.00 mg/dL 0.77 0.83 0.74  Sodium 135 - 145 mmol/L 141 137 138  Potassium 3.5 - 5.1 mmol/L 4.1 3.3(L) 3.4(L)  Chloride 98 - 111 mmol/L 104 94(L) 100  CO2 22 - 32 mmol/L _0 Calcium 8.9 - 10.3 mg/dL 9.2 9.5 9.2  Total Protein 6.5 - 8.1 g/dL - 7.5 7.1  Total Bilirubin 0.3 - 1.2 mg/dL - 0.4 0.5  Alkaline Phos 38 - 126 U/L - 126 81  AST 15 - 41 U/L - 30 24  ALT 0 - 44 U/L - 22 16    Lab Results  Component Value Date   WBC 6.2 12/16/2018   HGB 13.5 12/16/2018   HCT 40.2 12/16/2018   MCV 88.4 12/16/2018   PLT 180 12/16/2018   NEUTROABS 4.1 12/16/2018    ASSESSMENT & PLAN:  Malignant neoplasm of 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 Systemic chemotherapy with dose dense Adriamycin and Cytoxan x4 followed by Taxol weekly x12 2. Adjuvant radiation therapy 12/2/219-04/25/2018 3. followed by adjuvant antiestrogen therapy.  Patient refused because she was worried about her menopausal symptoms -------------------------------------------------------------------------------- Breast cancer surveillance: 1. Mammograms 10/30/18: indeterminate 2.2 cm group of calcifications in the lower outer right breast. Stereotactic Biopsy: Fibrocystic changes; Density cat C 2. Breast Exam: 12/23/18: Benign 3. Bone scan 12/20/2018: Negative for metastatic disease.  This was done to evaluate the rib pain.  Bilateral lung infiltrates: Suspicious for viral pneumonia.  She has been on levofloxacin her symptoms are slightly getting better. I gave her a prescription for Medrol Dosepak.  Chest wall discomfort and elevation of troponin: I sent a message to  her cardiologist Dr.Nishan to see if any work-up needs to be done.  If her chest pain gets worse she will go to the emergency room.  She is in double mind about going back to work.  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  I instructed her that if her symptoms get worse and we may have to write her a letter to take her off work.  RTC in 1 year  No orders of the defined types were placed in this encounter.  The patient has a  good understanding of the overall plan. she agrees with it. she will call with any problems that may develop before the next visit here.  Nicholas Lose, MD 12/23/2018  Julious Oka Dorshimer am acting as scribe for Dr. Nicholas Lose.  I have reviewed the above documentation for accuracy and completeness, and I agree with the above.

## 2018-12-23 ENCOUNTER — Inpatient Hospital Stay: Payer: 59 | Attending: Adult Health | Admitting: Hematology and Oncology

## 2018-12-23 ENCOUNTER — Other Ambulatory Visit: Payer: Self-pay

## 2018-12-23 DIAGNOSIS — Z170 Estrogen receptor positive status [ER+]: Secondary | ICD-10-CM | POA: Insufficient documentation

## 2018-12-23 DIAGNOSIS — Z79899 Other long term (current) drug therapy: Secondary | ICD-10-CM | POA: Insufficient documentation

## 2018-12-23 DIAGNOSIS — Z79811 Long term (current) use of aromatase inhibitors: Secondary | ICD-10-CM | POA: Insufficient documentation

## 2018-12-23 DIAGNOSIS — Z9221 Personal history of antineoplastic chemotherapy: Secondary | ICD-10-CM | POA: Insufficient documentation

## 2018-12-23 DIAGNOSIS — Z791 Long term (current) use of non-steroidal anti-inflammatories (NSAID): Secondary | ICD-10-CM | POA: Insufficient documentation

## 2018-12-23 DIAGNOSIS — C50311 Malignant neoplasm of lower-inner quadrant of right female breast: Secondary | ICD-10-CM | POA: Diagnosis present

## 2018-12-23 DIAGNOSIS — Z923 Personal history of irradiation: Secondary | ICD-10-CM | POA: Insufficient documentation

## 2018-12-23 MED ORDER — METHYLPREDNISOLONE 4 MG PO TBPK: ORAL_TABLET | ORAL | 0 refills | Status: DC

## 2018-12-23 NOTE — Assessment & Plan Note (Signed)
08/16/2017: Right lumpectomy: Grade 2 IDC 1.5 cm, with DCIS and necrosis, 0/3 lymph nodes negative, ER 95%, PR 30%, HER-2 negative ratio 1.14, Ki-67 40%, T1CN0 stage Ia Resection of the margins 09/11/2017: Benign  Treatment plan: 1. Systemic chemotherapy with dose dense Adriamycin and Cytoxan x4 followed by Taxol weekly x12 2. Adjuvant radiation therapy 12/2/219-04/25/2018 3. followed by adjuvant antiestrogen therapy.   -------------------------------------------------------------------------------- Treatment plan: Since she completed radiation therapy, I recommended starting antiestrogen therapy with anastrozole 1 mg daily to start 05/11/2018  Anastrozole Toxicities:  Breast cancer surveillance: 1. Mammograms 10/30/18: indeterminate 2.2 cm group of calcifications in the lower outer right breast. Stereotactic Biopsy: Fibrocystic changes; Density cat C 2. Breast Exam: 12/23/18: Benign  RTC in 1 year

## 2018-12-24 ENCOUNTER — Telehealth: Payer: Self-pay | Admitting: Hematology and Oncology

## 2018-12-24 ENCOUNTER — Telehealth: Payer: Self-pay | Admitting: Cardiovascular Disease

## 2018-12-24 NOTE — Progress Notes (Signed)
CARDIOLOGY CONSULT NOTE       Patient ID: Jasmine Allen MRN: [id number] DOB/AGE: 51-12-1965 51 y.o.  Admit date: (Not on file) Referring Physician: Lindi Adie Primary Physician: Aletha Halim., PA-C Primary Cardiologist: New/Erdem Naas Reason for Consultation: Chest Pain   Active Problems:   * No active hospital problems. *   HPI:  53 y.o. last seen by me in 2015 with atypical chest pain Had normal ETT at that time Seen in ER 12/16/18 with cough atypical chest pain and pneumonia Rx with one dose ceftriaxone in ER and 10 day course of doxycycline  Since Rx chest discomfort gradually improving but cough still bad at night and dyspnea. She quit smoking in 2018  Troponin's were minimally elevated 37/35 with no delta  ECG SR no acute changes ? Atrial enlargement CTA chest 12/09/18 no PE right middle and bilateral upper lobe pneumonia She has been Rx for breast cancer right lumpectomy 08/16/17 followed by systemic chemo including adriamycin , XRT and adjuvant antiestrogen Rx Echo 02/15/18 with EF 60-65% just mild MR and normal GLS -20  Still very emmotional. Tired of not feeling well still coughing Cannot take a deep breath without coughing No chest pain No edema or signs of CHF   ROS All other systems reviewed and negative except as noted above  Past Medical History:  Diagnosis Date  . Alopecia, unspecified   . Anemia, unspecified    during her pregnancy  . Anxiety state, unspecified   . Breast cancer ([place]) 2019   Right Breast Cancer  . Bulging disc   . Depressive disorder, not elsewhere classified   . Diverticulosis of colon (without mention of hemorrhage)    CT Scan  . Dysrhythmia    PVCs in the past  . Esophageal reflux   . Esophagitis 1998  . Hiatal hernia 1998  . History of kidney stones   . Hypercholesteremia   . Hypertension   . Migraine   . Opioid abuse, in remission [facility])     Family History  Problem Relation Age of Onset  . Heart disease Father        CABG  . Breast  cancer Mother   . Breast cancer Maternal Grandmother   . Breast cancer Cousin   . Colon cancer Neg Hx   . Stomach cancer Neg Hx     Social History   Socioeconomic History  . Marital status: Married    Spouse name: Not on file  . Number of children: Not on file  . Years of education: Not on file  . Highest education level: Not on file  Occupational History  . Not on file  Social Needs  . Financial resource strain: Not on file  . Food insecurity    Worry: Not on file    Inability: Not on file  . Transportation needs    Medical: No    Non-medical: No  Tobacco Use  . Smoking status: Former Smoker    Packs/day: 0.25    Quit date: 08/25/2016    Years since quitting: 2.3  . Smokeless tobacco: Never Used  Substance and Sexual Activity  . Alcohol use: Yes    Comment: "once a week"  . Drug use: No  . Sexual activity: Yes    Birth control/protection: None, Post-menopausal  Lifestyle  . Physical activity    Days per week: Not on file    Minutes per session: Not on file  . Stress: Not on file  Relationships  . Social connections  Talks on phone: Not on file    Gets together: Not on file    Attends religious service: Not on file    Active member of club or organization: Not on file    Attends meetings of clubs or organizations: Not on file    Relationship status: Not on file  . Intimate partner violence    Fear of current or ex partner: Not on file    Emotionally abused: Not on file    Physically abused: Not on file    Forced sexual activity: Not on file  Other Topics Concern  . Not on file  Social History Narrative  . Not on file    Past Surgical History:  Procedure Laterality Date  . ABLATION    . BREAST LUMPECTOMY Right 08/16/2017  . BREAST LUMPECTOMY WITH AXILLARY LYMPH NODE BIOPSY Right 08/16/2017   Procedure: RIGHT BREAST LUMPECTOMY WITH AXILLARY SENTINEL LYMPH NODE BIOPSY;  Surgeon: Rolm Bookbinder, MD;  Location: [facility];  Service: General;  Laterality:  Right;  . CARDIAC CATHETERIZATION  2011   normal coronaries  . KNEE SURGERY    . PORTACATH PLACEMENT Right 09/11/2017   Procedure: INSERTION PORT-A-CATH WITH ULTRASOUND;  Surgeon: Rolm Bookbinder, MD;  Location: [facility];  Service: General;  Laterality: Right;  . RE-EXCISION OF BREAST LUMPECTOMY Right 09/11/2017   Procedure: RE-EXCISION OF BREAST LUMPECTOMY;  Surgeon: Rolm Bookbinder, MD;  Location: [facility];  Service: General;  Laterality: Right;  . TONSILLECTOMY          Physical Exam: Blood pressure 132/72, pulse 97, height 5' (1.524 m), weight 113 lb (51.3 kg), last menstrual period 12/24/2010, SpO2 97 %.   Affect appropriate Healthy:  appears stated age 53: normal Neck supple with no adenopathy JVP normal no bruits no thyromegaly Lungs clear with no wheezing and good diaphragmatic motion Heart:  S1/S2 no murmur, no rub, gallop or click PMI normal Abdomen: benighn, BS positve, no tenderness, no AAA no bruit.  No HSM or HJR Distal pulses intact with no bruits No edema Neuro non-focal Skin warm and dry No muscular weakness   Labs:   Lab Results  Component Value Date   WBC 6.2 12/16/2018   HGB 13.5 12/16/2018   HCT 40.2 12/16/2018   MCV 88.4 12/16/2018   PLT 180 12/16/2018   No results for input(s): NA, K, CL, CO2, BUN, CREATININE, CALCIUM, PROT, BILITOT, ALKPHOS, ALT, AST, GLUCOSE in the last 168 hours.  Invalid input(s): LABALBU Lab Results  Component Value Date   CKTOTAL 65 02/16/2010   CKMB 1.1 02/16/2010   TROPONINI <0.03 04/05/2014    Lab Results  Component Value Date   CHOL  02/16/2010    162        ATP III CLASSIFICATION:  <200     mg/dL   Desirable  200-239  mg/dL   Borderline High  >=240    mg/dL   High          CHOL  02/16/2010    178        ATP III CLASSIFICATION:  <200     mg/dL   Desirable  200-239  mg/dL   Borderline High  >=240    mg/dL   High          Lab Results  Component Value Date   HDL  59 02/16/2010   HDL 64 02/16/2010   Lab Results  Component Value Date   LDLCALC  02/16/2010    86  Total Cholesterol/HDL:CHD Risk Coronary Heart Disease Risk Table                     Men   Women  1/2 Average Risk   3.4   3.3  Average Risk       5.0   4.4  2 X Average Risk   9.6   7.1  3 X Average Risk  23.4   11.0        Use the calculated Patient Ratio above and the CHD Risk Table to determine the patient's CHD Risk.        ATP III CLASSIFICATION (LDL):  <100     mg/dL   Optimal  100-129  mg/dL   Near or Above                    Optimal  130-159  mg/dL   Borderline  160-189  mg/dL   High  >190     mg/dL   Very High   LDLCALC (H) 02/16/2010    100        Total Cholesterol/HDL:CHD Risk Coronary Heart Disease Risk Table                     Men   Women  1/2 Average Risk   3.4   3.3  Average Risk       5.0   4.4  2 X Average Risk   9.6   7.1  3 X Average Risk  23.4   11.0        Use the calculated Patient Ratio above and the CHD Risk Table to determine the patient's CHD Risk.        ATP III CLASSIFICATION (LDL):  <100     mg/dL   Optimal  100-129  mg/dL   Near or Above                    Optimal  130-159  mg/dL   Borderline  160-189  mg/dL   High  >190     mg/dL   Very High   Lab Results  Component Value Date   TRIG 86 02/16/2010   TRIG 72 02/16/2010   Lab Results  Component Value Date   CHOLHDL 2.7 02/16/2010   CHOLHDL 2.8 02/16/2010   No results found for: LDLDIRECT    Radiology: Dg Chest 2 View  Result Date: 12/31/2018 CLINICAL DATA:  Pneumonia.  Follow-up exam. EXAM: CHEST - 2 VIEW COMPARISON:  12/16/2018. FINDINGS: Heart size normal. Diffuse bilateral pulmonary infiltrates, improved from prior exam. No pleural effusion or pneumothorax. Degenerative changes scoliosis thoracic spine. IMPRESSION: Diffuse bilateral pulmonary infiltrates, improved from prior exam. Continued follow-up exams suggested to demonstrate complete clearing. Electronically  Signed   By: Marcello Moores  Register   On: 12/31/2018 09:04   Dg Chest 2 View  Result Date: 12/16/2018 CLINICAL DATA:  Shortness of breath. Right-sided pneumonia diagnosed 1 week ago. No fever. Smoker. EXAM: CHEST - 2 VIEW COMPARISON:  Chest radiographs dated 11/22/2018 and chest CT dated 12/09/2018. FINDINGS: Normal sized heart. Mild patchy opacity in the right middle lobe with an appearance similar to that seen on 12/09/2018. Mild diffuse peribronchial thickening and accentuation of the interstitial markings. Mild to moderate scoliosis. No pleural fluid. IMPRESSION: 1. Mild right middle lobe pneumonia without significant change. 2. Mild chronic bronchitic changes. Electronically Signed   By: Claudie Revering M.D.   On: 12/16/2018 14:49   Ct  Angio Chest Pe W And/or Wo Contrast  Result Date: 12/09/2018 CLINICAL DATA:  Complex chest pain over the last 5 weeks. Shortness of breath. EXAM: CT ANGIOGRAPHY CHEST WITH CONTRAST TECHNIQUE: Multidetector CT imaging of the chest was performed using the standard protocol during bolus administration of intravenous contrast. Multiplanar CT image reconstructions and MIPs were obtained to evaluate the vascular anatomy. CONTRAST:  178mL OMNIPAQUE IOHEXOL 350 MG/ML SOLN COMPARISON:  Radiography 11/22/2018 FINDINGS: Cardiovascular: Pulmonary arterial opacification is good. There are no pulmonary emboli. Systemic arterial opacification is good. No aortic atherosclerosis is seen. No aneurysm or dissection. Heart size is within normal limits. No visible coronary artery calcification. Mediastinum/Nodes: No mass or lymphadenopathy. Lungs/Pleura: There are numerous foci of patchy infiltrate in the upper lobes, more extensive on the right than the left. There is right middle lobe patchy bronchopneumonia. Right lower lobe is clear. Left lower lobe is clear. No pleural effusion. Upper Abdomen: Normal Musculoskeletal: There are scattered sclerotic foci within the spine, most notable at the T1  level. No destructive lesions. The appearance is nonspecific. In a patient with a history of breast cancer bone metastases are not excluded. Review of the MIP images confirms the above findings. IMPRESSION: Patchy infiltrates consistent with bronchopneumonia. This could be bacterial or viral. Most pronounced involvement in the right middle lobe. Bilateral upper lobe involvement, right more than left. Scattered sclerotic foci within the vertebral bodies. These are nonspecific, but in a patient with a history of breast cancer do raise some concern for bone metastases. There is no evidence vascular disease. Electronically Signed   By: Nelson Chimes M.D.   On: 12/09/2018 13:53   Nm Bone Scan Whole Body  Result Date: 12/20/2018 CLINICAL DATA:  RIGHT breast cancer.  Rib pain. EXAM: NUCLEAR MEDICINE WHOLE BODY BONE SCAN TECHNIQUE: Whole body anterior and posterior images were obtained approximately 3 hours after intravenous injection of radiopharmaceutical. RADIOPHARMACEUTICALS:  21.7 mCi Technetium-51m MDP IV COMPARISON:  CT chest 12/09/2018 FINDINGS: No focal localization of radiotracer in the axillary or appendicular skeleton suggest active skeletal metastasis. Scattered sclerotic lesions on the comparison CT are not appreciated on current exam. SCOLIOSIS NOTED. IMPRESSION: NO SCINTIGRAPHIC EVIDENCE OF ACTIVE SKELETAL METASTASIS. Electronically Signed   By: Suzy Bouchard M.D.   On: 12/20/2018 17:32    EKG:  NSR normal 12/10/18   ASSESSMENT AND PLAN:   1. Chest pain atypical in setting of multilobar pneumonia normal ECG minimal elevation in troponin with no delta. She does not feel she can do a stress test at this time and I don't think its indicated at this time  2. Dyspnea:  Should have f/u CT to document improvement in pneumonia Ordered today at our office Needs f/u with Dr Annamaria Boots not clear why a fairly young non smoker would get double pneumonia She also indicates having double pneumonia 12 years ago.  Will order f/u echo for EF was normal November 2019 but has had chemo  3. Breast Cancer :  Some abnormal calcifications residual in right breast being followed on anti estrogen Rx 4. HLD  On statin labs with primary   F/U with me in 3 months consider f/u stress testing at that time if pulmonary issues resolved   Signed: Jenkins Rouge 01/06/2019, 9:29 AM

## 2018-12-24 NOTE — Telephone Encounter (Signed)
I talk with patient she requested early august and she will check with insurance to make sure they will cover

## 2018-12-24 NOTE — Telephone Encounter (Addendum)
Appointment scheduled 9/28    ----- Message from Josue Hector, MD sent at 12/24/2018 12:00 PM EDT ----- That's fine ----- Message ----- From: Laurier Nancy Sent: 12/24/2018   8:50 AM EDT To: Josue Hector, MD  I have contacted this patient. She only wants to see you. I have offered her several other providers and locations. Your schedule does not  have a new patient appointment until mid October.  Do you want to use a  established slot within 2 weeks  as a "new patient" to see her?   ----- Message ----- From: Josue Hector, MD Sent: 12/23/2018   8:00 PM EDT To: [address] Scheduling  Patient I saw 5 years ago. Seen in ER for atypical chest pain in setting of pneumonia Please arrange "new" patient evaluation with anyone in next 2 weeks for chest pain

## 2018-12-25 ENCOUNTER — Ambulatory Visit: Payer: 59 | Admitting: Hematology and Oncology

## 2018-12-25 ENCOUNTER — Other Ambulatory Visit: Payer: Self-pay | Admitting: Hematology and Oncology

## 2018-12-25 ENCOUNTER — Other Ambulatory Visit: Payer: Self-pay | Admitting: *Deleted

## 2018-12-25 MED ORDER — HYDROCODONE-ACETAMINOPHEN 5-325 MG PO TABS: 1.0000 | ORAL_TABLET | Freq: Two times a day (BID) | ORAL | 0 refills | Status: DC | PRN

## 2018-12-30 ENCOUNTER — Other Ambulatory Visit: Payer: Self-pay

## 2018-12-30 ENCOUNTER — Other Ambulatory Visit: Payer: Self-pay | Admitting: Family Medicine

## 2018-12-30 ENCOUNTER — Ambulatory Visit
Admission: RE | Admit: 2018-12-30 | Discharge: 2018-12-30 | Disposition: A | Discharge status: Home / Self Care | Payer: 59 | Source: Ambulatory Visit | Attending: Family Medicine | Admitting: Family Medicine

## 2018-12-30 DIAGNOSIS — J189 Pneumonia, unspecified organism: Secondary | ICD-10-CM

## 2019-01-02 ENCOUNTER — Telehealth: Payer: Self-pay | Admitting: Internal Medicine

## 2019-01-02 NOTE — Telephone Encounter (Signed)
Dr. Melvyn Novas has a 9:15am opening that I held - this patient doesn't have a referral in her chart - pt just wanting to schedule a consult appt - question is - since patient has cough and possible pneumonia does MW want to see the patient in the clinic?-pr

## 2019-01-02 NOTE — Telephone Encounter (Signed)
ATC patient, she has never been seen in our office, message under Dr. Annamaria Boots, but message says Dr. Melvyn Novas.    Will route to [facility] to follow up on consult

## 2019-01-03 ENCOUNTER — Other Ambulatory Visit: Payer: Self-pay

## 2019-01-03 ENCOUNTER — Telehealth: Payer: Self-pay | Admitting: Internal Medicine

## 2019-01-03 ENCOUNTER — Ambulatory Visit (INDEPENDENT_AMBULATORY_CARE_PROVIDER_SITE_OTHER): Payer: 59 | Admitting: Internal Medicine

## 2019-01-03 ENCOUNTER — Encounter: Payer: Self-pay | Admitting: Internal Medicine

## 2019-01-03 VITALS — BP 128/66 | HR 103 | Temp 97.9°F | Ht 60.0 in | Wt 113.8 lb

## 2019-01-03 DIAGNOSIS — J189 Pneumonia, unspecified organism: Secondary | ICD-10-CM | POA: Diagnosis not present

## 2019-01-03 DIAGNOSIS — Z20822 Contact with and (suspected) exposure to covid-19: Secondary | ICD-10-CM

## 2019-01-03 DIAGNOSIS — R071 Chest pain on breathing: Secondary | ICD-10-CM

## 2019-01-03 DIAGNOSIS — R6889 Other general symptoms and signs: Secondary | ICD-10-CM

## 2019-01-03 MED ORDER — TRELEGY ELLIPTA 100-62.5-25 MCG/INH IN AEPB: 1.0000 | INHALATION_SPRAY | Freq: Every day | RESPIRATORY_TRACT | 0 refills | Status: DC

## 2019-01-03 NOTE — Telephone Encounter (Signed)
That's fine

## 2019-01-03 NOTE — Telephone Encounter (Signed)
lmtcb for pt.  

## 2019-01-03 NOTE — Progress Notes (Signed)
01/03/2019- 73 yoF former smoker self referred due to cough and chest pain. Hx R breast cancer in remission, GERD w HH, HBP Was Dr Gustavus Bryant nurse in [phone]. He didn't have availability today. ED 7/26 chest pain- respiratory, sharp stabbing, center chest, tender ED 9/7-  5 weeks constant non-productive cough, severe chest and rib cage pain w cough causing SOB and limiting deep breath. Carafate/ pantoprazole not helpful, d-dimer neg, tessalon and prednisone not helpful,  -----pt reports chronic, nonproductive dry cough and DOE for a few months;  reports recent pneumonia (see ED note 12/16/2018) Arrival sat 98% Protonix 40, Xanax, Norco, albuterol hfa, zyrtec, Atarax, She had mammogram/ needle bx 7/23. MM R breast described as quite painful. Bx was not painful. Cough started a few days after and was never productive or associated with fever. Splinting > SOB. Pain was present before onset of cough, and got worse. ER dx'd musculoskeletal, costochondritis. Ibuprofen and muscle relaxer helped some. Ret to ER 8/31- CT dx'd diffuse pneumonia.  Treated Rocephin, doxy x 10 d, levaquin x 8 d, 2 rounds steroid tapers.  Has also seen PCP and Dr Lindi [facility].  Waking night sweats x 4 weeks but no fever,chills, rash, nodes.  Continues Protonix 40 bid, and Pepcid AC. Not aware of reflux events or aspiration, but might happen in sleep - takes 1/2 x xanax.  Works Engineer, petroleum at school.  CBC- 9/7- WBC 6,200 Covid 8/31- NEG CXR 12/31/2018-  IMPRESSION: Diffuse bilateral pulmonary infiltrates, improved from prior exam. Continued follow-up exams suggested to demonstrate complete Clearing. CTa chest- 12/09/2018 Patchy infiltrates consistent with bronchopneumonia. This could be bacterial or viral. Most pronounced involvement in the right middle lobe. Bilateral upper lobe involvement, right more than left. Scattered sclerotic foci within the vertebral bodies. These are nonspecific, but in a patient with a history of  breast cancer do raise some concern for bone metastases. There is no evidence vascular disease. NM Bone scan 9/11- Neg for mets  Prior to Admission medications   Medication Sig Start Date End Date Taking? Authorizing Provider  albuterol (VENTOLIN HFA) 108 (90 Base) MCG/ACT inhaler Inhale into the lungs every 6 (six) hours as needed for wheezing or shortness of breath.   Yes [provider]  ALPRAZolam Duanne Moron) 0.5 MG tablet Take 0.5 mg by mouth at bedtime as needed for anxiety or sleep.  10/21/13  Yes [provider]  atorvastatin (LIPITOR) 20 MG tablet Take 20 mg by mouth daily. 11/27/18  Yes [provider]  Biotin 10000 MCG TABS Take 10,000 mcg by mouth daily.    Yes [provider]  bumetanide (BUMEX) 1 MG tablet TAKE 1 TABLET(1 MG) BY MOUTH TWICE DAILY 10/31/18  Yes Nicholas Lose, MD  cetirizine (ZYRTEC) 10 MG tablet Take 10 mg by mouth daily.   Yes [provider]  cloNIDine (CATAPRES) 0.3 MG tablet Take 0.3 mg by mouth at bedtime.    Yes [provider]  cyclobenzaprine (FLEXERIL) 10 MG tablet Take 10 mg by mouth every 8 (eight) hours as needed for muscle spasms. 07/16/17  Yes [provider]  HYDROcodone-acetaminophen (NORCO/VICODIN) 5-325 MG tablet Take 1 tablet by mouth every 12 (twelve) hours as needed. 12/25/18  Yes Nicholas Lose, MD  hydrOXYzine (ATARAX/VISTARIL) 10 MG tablet Take 10-30 mg by mouth at bedtime as needed for itching.  10/07/15  Yes [provider]  hyoscyamine (LEVSIN SL) 0.125 MG SL tablet dissolve 1 tablet under the tongue every 4 hours if needed Patient taking differently: Take  0.125 mg by mouth every 4 (four) hours as needed for cramping.  10/02/16  Yes Nandigam, Venia Minks, MD  ibuprofen (ADVIL) 800 MG tablet Take 1 tablet (800 mg total) by mouth every 6 (six) hours as needed for moderate pain. 11/03/18  Yes Pollina, Gwenyth Allegra, MD  LORazepam (ATIVAN) 0.5 MG tablet Take 1 tablet daily, as needed for  nausea. 12/06/18  Yes Nicholas Lose, MD  metaxalone (SKELAXIN) 800 MG tablet Take 800 mg by mouth 3 (three) times daily. 12/03/18  Yes [provider]  methocarbamol (ROBAXIN) 500 MG tablet Take 1 tablet (500 mg total) by mouth every 8 (eight) hours as needed for muscle spasms. 11/03/18  Yes Pollina, Gwenyth Allegra, MD  methylPREDNISolone (MEDROL DOSEPAK) 4 MG TBPK tablet Use as directed 12/23/18  Yes Nicholas Lose, MD  Multiple Vitamin (MULITIVITAMIN WITH MINERALS) TABS Take 1 tablet by mouth daily.   Yes [provider]  pantoprazole (PROTONIX) 40 MG tablet Take 1 tablet (40 mg total) by mouth 2 (two) times daily before a meal. Patient taking differently: Take 40 mg by mouth daily.  06/28/16  Yes Nandigam, Venia Minks, MD  traZODone (DESYREL) 100 MG tablet Take 100 mg by mouth at bedtime.  10/17/12  Yes [provider]  valACYclovir (VALTREX) 1000 MG tablet Take 1,000 mg by mouth 2 (two) times daily as needed (for out breaks).    Yes [provider]  Fluticasone-Umeclidin-Vilant (TRELEGY ELLIPTA) 100-62.5-25 MCG/INH AEPB Inhale 1 puff into the lungs daily. 01/03/19   Deneise Lever, MD   Past Medical History:  Diagnosis Date  . Alopecia, unspecified   . Anemia, unspecified    during her pregnancy  . Anxiety state, unspecified   . Breast cancer ([place]) 2019   Right Breast Cancer  . Bulging disc   . Depressive disorder, not elsewhere classified   . Diverticulosis of colon (without mention of hemorrhage)    CT Scan  . Dysrhythmia    PVCs in the past  . Esophageal reflux   . Esophagitis 1998  . Hiatal hernia 1998  . History of kidney stones   . Hypercholesteremia   . Hypertension   . Migraine   . Opioid abuse, in remission [facility])    Past Surgical History:  Procedure Laterality Date  . ABLATION    . BREAST LUMPECTOMY Right 08/16/2017  . BREAST LUMPECTOMY WITH AXILLARY LYMPH NODE BIOPSY Right 08/16/2017   Procedure: RIGHT BREAST LUMPECTOMY WITH AXILLARY  SENTINEL LYMPH NODE BIOPSY;  Surgeon: Rolm Bookbinder, MD;  Location: [facility];  Service: General;  Laterality: Right;  . CARDIAC CATHETERIZATION  2011   normal coronaries  . KNEE SURGERY    . PORTACATH PLACEMENT Right 09/11/2017   Procedure: INSERTION PORT-A-CATH WITH ULTRASOUND;  Surgeon: Rolm Bookbinder, MD;  Location: [facility];  Service: General;  Laterality: Right;  . RE-EXCISION OF BREAST LUMPECTOMY Right 09/11/2017   Procedure: RE-EXCISION OF BREAST LUMPECTOMY;  Surgeon: Rolm Bookbinder, MD;  Location: [facility];  Service: General;  Laterality: Right;  . TONSILLECTOMY     Family History  Problem Relation Age of Onset  . Heart disease Father        CABG  . Breast cancer Mother   . Breast cancer Maternal Grandmother   . Breast cancer Cousin   . Colon cancer Neg Hx   . Stomach cancer Neg Hx    Social History   Socioeconomic History  . Marital status: Married    Spouse name: Not on  file  . Number of children: Not on file  . Years of education: Not on file  . Highest education level: Not on file  Occupational History  . Not on file  Social Needs  . Financial resource strain: Not on file  . Food insecurity    Worry: Not on file    Inability: Not on file  . Transportation needs    Medical: No    Non-medical: No  Tobacco Use  . Smoking status: Former Smoker    Packs/day: 0.25    Quit date: 08/25/2016    Years since quitting: 2.3  . Smokeless tobacco: Never Used  Substance and Sexual Activity  . Alcohol use: Yes    Comment: "once a week"  . Drug use: No  . Sexual activity: Yes    Birth control/protection: None, Post-menopausal  Lifestyle  . Physical activity    Days per week: Not on file    Minutes per session: Not on file  . Stress: Not on file  Relationships  . Social Herbalist on phone: Not on file    Gets together: Not on file    Attends religious service: Not on file    Active member of club or organization:  Not on file    Attends meetings of clubs or organizations: Not on file    Relationship status: Not on file  . Intimate partner violence    Fear of current or ex partner: Not on file    Emotionally abused: Not on file    Physically abused: Not on file    Forced sexual activity: Not on file  Other Topics Concern  . Not on file  Social History Narrative  . Not on file   ROS-see HPI   + = positive Constitutional:    weight loss, +night sweats, fevers, chills, fatigue, lassitude. HEENT:    headaches, difficulty swallowing, tooth/dental problems, sore throat,       sneezing, itching, ear ache, nasal congestion, post nasal drip, snoring CV:    chest pain, orthopnea, PND, swelling in lower extremities, anasarca,                                  dizziness, palpitations Resp:   +shortness of breath with exertion or at rest.                productive cough,   +non-productive cough, coughing up of blood.              change in color of mucus.  wheezing.   Skin:    rash or lesions. GI:  No-   heartburn, indigestion, abdominal pain, nausea, vomiting, diarrhea,                 change in bowel habits, loss of appetite GU: dysuria, change in color of urine, no urgency or frequency.   flank pain. MS:   joint pain, stiffness, decreased range of motion, back pain. Neuro-     nothing unusual Psych:  change in mood or affect.  depression or anxiety.   memory loss.  OBJ- Physical Exam General- Alert, Oriented, Affect-appropriate, Distress- none acute Skin- + 3 soft 1/4 cm cutaneous nodules upper anterior chest, non-tender Lymphadenopathy- none Head- atraumatic            Eyes- Gross vision intact, PERRLA, conjunctivae and secretions clear  Ears- Hearing, canals-normal            Nose- Clear, no-Septal dev, mucus, polyps, erosion, perforation             Throat- Mallampati II , mucosa clear , drainage- none, tonsils- atrophic Neck- flexible , trachea midline, no stridor , thyroid nl, carotid  no bruit Chest - symmetrical excursion , unlabored           Heart/CV- RRR , no murmur , no gallop  , no rub, nl s1 s2                           - JVD- none , edema- none, stasis changes- none, varices- none           Lung- +slight upper airway rattle, wheeze- none, cough+ with deep breath, dullness-none, rub- none           Chest wall- + Splinting pain across upper anterior chest with deep breath, non-focal, no rub. Abd-  Br/ Gen/ Rectal- Not done, not indicated Extrem- cyanosis- none, clubbing, none, atrophy- none, strength- nl Neuro- grossly intact to observation

## 2019-01-03 NOTE — Assessment & Plan Note (Signed)
Diffuse bilateral pneumonia identified on CT 8/31, improved on CXR 9/22. WBC never incr, which favors viral or less likely aspiration. Has had several rounds of antibiotics with atypical coverage, and steroids. No definite aspiration event.  Most likely this was viral. Covid test was negative but we can recheck that as she wants to return to work.  Plan- no more abx. F/u CXR, repeat Covid assay to double check. Continue Covid precautions at work.

## 2019-01-03 NOTE — Assessment & Plan Note (Signed)
She reports R mammogram in July was quite painful. Previous lumpectomy, no XRT. May have torn some soft tissue or cartilage. Subsequently worse with coughing and more labored breathing with pneumonia. Other wise I don't see connection between pain and infiltrate. CT r/o PE or displaced fracture. Bone scan r/o bone mets. Plan- comfort measures with heat, analgesics, antitussives, splinting with pillow.

## 2019-01-03 NOTE — Telephone Encounter (Signed)
Called and spoke with pt to create a consultation appointment w/ MW. As it is after 9:15 AM, the held spot originally made for her per Patrice's note is no longer available. MW also does not have any more 30 min consult slots available for the day. I let pt know we could create a consultation appt w/ MW early next week. Pt denied this and stated she wants to be seen in the office today for fear that she will have to be seen in the emergency department over the weekend. Pt states she has a chronic cough that steroids and albuterol are not helping with. I checked the schedules all of our other providers in the office today - RB, PM, and CY - and found no opening except for CY's 11:30 RNA slot. I let pt know that we could offer her an appt for 11:30 AM today with CY. Pt expressed distress; however, stated she would try to make it and call us if she has to reschedule. Pt is negative for covid screen. Appt has been scheduled. Routing to CY as an [place]. Nothing further needed at this time.

## 2019-01-03 NOTE — Telephone Encounter (Signed)
Attempted to call, no answer, left a voicemail.  Patient is already scheduled for consult. Nothing further needed at this time.

## 2019-01-03 NOTE — Telephone Encounter (Signed)
Called and spoke to patient. 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  Patient is reporting a chronic cough that steroids and albuterol are not helping with. Patient was tested for covid 3 weeks ago and was negative. Patient has had several chest xrays as well.  Patient stated since breast cancer diagnosis a year ago she has had continual issues.  Patient is asking to be able to come in and be seen as consult with Dr. Melvyn Novas.  Dr. Melvyn Novas please advise!

## 2019-01-03 NOTE — Patient Instructions (Addendum)
Order- Covid assay     Dx Community pneumonia  Order- future CXR 1 week outpatient    Dx Community pneumonia  Ok to use Tussionex every 12 hours if needed.  Caution with narcotics as discussed Ok to also use otc cough drops ( Jolly Ranchers work well), Delsym cough syrup, Tramadol, heating pads, Ibuprofen and other comfort meds as needed.  Sample Trelegy inhaler    Inhale 1 puff, then rinse mouth, once daily.  Brace with a pillow when you cough if you can.   Continue your acid blockers for now  Please call as needed

## 2019-01-04 LAB — NOVEL CORONAVIRUS, NAA: SARS-CoV-2, NAA: NOT DETECTED

## 2019-01-06 ENCOUNTER — Ambulatory Visit (INDEPENDENT_AMBULATORY_CARE_PROVIDER_SITE_OTHER)
Admission: RE | Admit: 2019-01-06 | Discharge: 2019-01-06 | Disposition: A | Discharge status: Home / Self Care | Payer: 59 | Source: Ambulatory Visit | Attending: Cardiovascular Disease | Admitting: Cardiovascular Disease

## 2019-01-06 ENCOUNTER — Other Ambulatory Visit: Payer: Self-pay

## 2019-01-06 ENCOUNTER — Ambulatory Visit (INDEPENDENT_AMBULATORY_CARE_PROVIDER_SITE_OTHER): Payer: 59 | Admitting: Cardiovascular Disease

## 2019-01-06 ENCOUNTER — Encounter: Payer: Self-pay | Admitting: Cardiovascular Disease

## 2019-01-06 ENCOUNTER — Telehealth: Payer: Self-pay | Admitting: Cardiovascular Disease

## 2019-01-06 VITALS — BP 132/72 | HR 97 | Ht 60.0 in | Wt 113.0 lb

## 2019-01-06 DIAGNOSIS — R0602 Shortness of breath: Secondary | ICD-10-CM | POA: Diagnosis not present

## 2019-01-06 DIAGNOSIS — J90 Pleural effusion, not elsewhere classified: Secondary | ICD-10-CM

## 2019-01-06 DIAGNOSIS — R0609 Other forms of dyspnea: Secondary | ICD-10-CM | POA: Diagnosis not present

## 2019-01-06 MED ORDER — ALBUTEROL SULFATE HFA 108 (90 BASE) MCG/ACT IN AERS: 1.0000 | INHALATION_SPRAY | Freq: Four times a day (QID) | RESPIRATORY_TRACT | 0 refills | Status: DC | PRN

## 2019-01-06 NOTE — Telephone Encounter (Signed)
New Message  Tech from [facility] is calling in to speak with Dr. Kyla Balzarine nurse to get some details on the CT of chest with contrast.

## 2019-01-06 NOTE — Telephone Encounter (Signed)
Called and spoke with pt regarding CY's impression and recommendations. Pt verbalized understanding and agreed to having a thoracentesis done at [facility] also requested to have her albuterol rescue inhaler refilled and states her Trelegy inhaler did not feel therapeutic to her.    Order for albuterol rescue inhaler has been placed. Order for thoracentesis has been placed to be done at [facility]. Pt is aware a scheduler will be reaching out to her to schedule this. Pt is also aware to f/u with Dr. Johnsie Cancel and her oncologist.  Routing to [facility] as an FYI and for any other recommendations. CY, please advise.

## 2019-01-06 NOTE — Telephone Encounter (Signed)
LMOMTCB x 1 

## 2019-01-06 NOTE — Telephone Encounter (Signed)
Left message for patient to call back  

## 2019-01-06 NOTE — Telephone Encounter (Signed)
Pt returning call wanted to let us know that she got msg. Also wanting to know if Dr young can call her in an abuterol inhaler says she can't do the treglegy.Hillery Hunter

## 2019-01-06 NOTE — Patient Instructions (Addendum)
Medication Instructions:   If you need a refill on your cardiac medications before your next appointment, please call your pharmacy.   Lab work:  If you have labs (blood work) drawn today and your tests are completely normal, you will receive your results only by: Marland Kitchen MyChart Message (if you have MyChart) OR . A paper copy in the mail If you have any lab test that is abnormal or we need to change your treatment, we will call you to review the results.  Testing/Procedures: Chest CT, as soon as possible, Non-Cardiac CT scanning, (CAT scanning), is a noninvasive, special x-ray that produces cross-sectional images of the body using x-rays and a computer. CT scans help physicians diagnose and treat medical conditions. For some CT exams, a contrast material is used to enhance visibility in the area of the body being studied. CT scans provide greater clarity and reveal more details than regular x-ray exams.  Your physician has requested that you have an echocardiogram. Echocardiography is a painless test that uses sound waves to create images of your heart. It provides your doctor with information about the size and shape of your heart and how well your heart's chambers and valves are working. This procedure takes approximately one hour. There are no restrictions for this procedure.  Follow-Up: At [facility], you and your health needs are our priority.  As part of our continuing mission to provide you with exceptional heart care, we have created designated Provider Care Teams.  These Care Teams include your primary Cardiologist (physician) and Advanced Practice Providers (APPs -  Physician Assistants and Nurse Practitioners) who all work together to provide you with the care you need, when you need it. You will need a follow up appointment in 3 months.  You may see Dr. Johnsie Cancel or one of the following Advanced Practice Providers on your designated Care Team:   Truitt Merle, NP Cecilie Kicks, NP . Kathyrn Drown, NP  Your physician recommends that you schedule a follow-up appointment with pulmonology.

## 2019-01-06 NOTE — Telephone Encounter (Signed)
Dr Johnsie Cancel called to alert me that repeat CT chest today raises concern of lymphangitic spread of cancer. I have reviewed these images. There are R>L pleural effusions. We will contact her to arrange thoracentesis.  Dr Johnsie Cancel also contacted her Oncologist.  Plan- order- Schedule thoracentesis ultrasound guided R side- for cytology and cell block, cell county and diff, total protein, albumin, routine C&S  For dx pleural effusion

## 2019-01-06 NOTE — Telephone Encounter (Signed)
Left message for patient to call back to see what's going on with her Trelegy inhaler.

## 2019-01-06 NOTE — Telephone Encounter (Signed)
Pt is calling back

## 2019-01-06 NOTE — Telephone Encounter (Signed)
Tech wanted to know if images could be pulled up. Only doctor has assess to images. Will forward to Dr. Johnsie Cancel. Tech stated report should be available to read.

## 2019-01-07 ENCOUNTER — Other Ambulatory Visit: Payer: Self-pay

## 2019-01-07 ENCOUNTER — Ambulatory Visit (HOSPITAL_COMMUNITY): Payer: 59 | Attending: Cardiology

## 2019-01-07 ENCOUNTER — Telehealth: Payer: Self-pay | Admitting: Internal Medicine

## 2019-01-07 ENCOUNTER — Inpatient Hospital Stay (HOSPITAL_BASED_OUTPATIENT_CLINIC_OR_DEPARTMENT_OTHER): Payer: 59 | Admitting: Hematology and Oncology

## 2019-01-07 VITALS — BP 125/81 | HR 95 | Temp 98.5°F | Resp 17 | Ht 60.0 in | Wt 116.0 lb

## 2019-01-07 DIAGNOSIS — C50919 Malignant neoplasm of unspecified site of unspecified female breast: Secondary | ICD-10-CM

## 2019-01-07 DIAGNOSIS — C50311 Malignant neoplasm of lower-inner quadrant of right female breast: Secondary | ICD-10-CM

## 2019-01-07 DIAGNOSIS — R0602 Shortness of breath: Secondary | ICD-10-CM | POA: Insufficient documentation

## 2019-01-07 DIAGNOSIS — J90 Pleural effusion, not elsewhere classified: Secondary | ICD-10-CM

## 2019-01-07 DIAGNOSIS — Z170 Estrogen receptor positive status [ER+]: Secondary | ICD-10-CM

## 2019-01-07 MED ORDER — HYDROCOD POLST-CPM POLST ER 10-8 MG/5ML PO SUER: 5.0000 mL | Freq: Two times a day (BID) | ORAL | 0 refills | Status: DC

## 2019-01-07 NOTE — Progress Notes (Signed)
Patient Care Team: Aletha Halim., PA-C as PCP - General (Family Medicine) Nicholas Lose, MD as Consulting Physician (Hematology and Oncology) Kyung Rudd, MD as Consulting Physician (Radiation Oncology) Rolm Bookbinder, MD as Consulting Physician (General Surgery)  DIAGNOSIS:  Encounter Diagnoses  Name Primary?  . Malignant neoplasm of lower-inner quadrant of right breast of female, estrogen receptor positive ([place])   . Metastatic breast cancer ([place]) Yes    SUMMARY OF ONCOLOGIC HISTORY: Oncology History  Malignant neoplasm of lower-inner quadrant of right breast of female, estrogen receptor positive ([place])  08/16/2017 Initial Diagnosis   Right lumpectomy: 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  High risk   08/16/2017 Cancer Staging   Staging form: Breast, AJCC 8th Edition - Pathologic stage from 08/16/2017: Stage IA (pT1c, pN0, cM0, G2, ER+, PR+, HER2-, Oncotype DX score: 31) - Signed by Gardenia Phlegm, NP on 09/12/2018   09/25/2017 - 02/05/2018 Chemotherapy   Adjuvant chemotherapy with dose dense Adriamycin and Cytoxan x4 followed by Taxol weekly x12    03/11/2018 - 04/25/2018 Radiation Therapy   Adjuvant XRT   05/2018 -  Anti-estrogen oral therapy   Anastrozole daily, plan for 7 years     CHIEF COMPLIANT: Patient is here to discuss her recent chest CT  INTERVAL HISTORY: [name] is a 53 year old with above-mentioned history of right breast cancer treated with lumpectomy adjuvant chemotherapy and radiation who presented with respiratory symptoms and a CT chest showed bilateral pneumonialike changes in August.  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  There were some sclerotic lesions and vague hypodensities in the liver as well.  She continues to have respiratory symptoms with a chronic cough for which he takes Tussionex.  She is extremely stressed out and is here accompanied by her husband.  REVIEW OF SYSTEMS:   Constitutional: Denies fevers, chills or abnormal weight loss Eyes: Denies blurriness of vision Ears, nose, mouth, throat, and face: Denies mucositis or sore throat Respiratory: Cough and shortness of breath Cardiovascular: Denies palpitation, chest discomfort Gastrointestinal:  Denies nausea, heartburn or change in bowel habits Skin: Denies abnormal skin rashes Lymphatics: Denies new lymphadenopathy or easy bruising Neurological:Denies numbness, tingling or new weaknesses Behavioral/Psych: Mood is stable, no new changes  Extremities: No lower extremity edema   All other systems were reviewed with the patient and are negative.  I have reviewed the past medical history, past surgical history, social history and family history with the patient and they are unchanged from previous note.  ALLERGIES:  is allergic to amoxicillin-pot clavulanate; erythromycin; metoclopramide hcl; and sulfonamide derivatives.  MEDICATIONS:  Current Outpatient Medications  Medication Sig Dispense Refill  . albuterol (VENTOLIN HFA) 108 (90 Base) MCG/ACT inhaler Inhale 1 puff into the lungs every 6 (six) hours as needed for wheezing or shortness of breath. 6.7 g 0  . ALPRAZolam (XANAX) 0.5 MG tablet Take 0.5 mg by mouth at bedtime as needed for anxiety or sleep.     Marland Kitchen atorvastatin (LIPITOR) 20 MG tablet Take 20 mg by mouth daily.    . Biotin 10000 MCG TABS Take  10,000 mcg by mouth daily.     . bumetanide (BUMEX) 1 MG tablet TAKE 1 TABLET(1 MG) BY MOUTH TWICE DAILY 60 tablet 2  . cetirizine (ZYRTEC) 10 MG tablet Take 10 mg by mouth daily.    .  chlorpheniramine-HYDROcodone (TUSSIONEX) 10-8 MG/5ML SUER Take 5 mLs by mouth 2 (two) times daily. 140 mL 0  . cloNIDine (CATAPRES) 0.3 MG tablet Take 0.3 mg by mouth at bedtime.     . cyclobenzaprine (FLEXERIL) 10 MG tablet Take 10 mg by mouth every 8 (eight) hours as needed for muscle spasms.  1  . Fluticasone-Umeclidin-Vilant (TRELEGY ELLIPTA) 100-62.5-25 MCG/INH AEPB Inhale 1 puff into the lungs daily. 1 each 0  . HYDROcodone-acetaminophen (NORCO/VICODIN) 5-325 MG tablet Take 1 tablet by mouth every 12 (twelve) hours as needed. 45 tablet 0  . hydrOXYzine (ATARAX/VISTARIL) 10 MG tablet Take 10-30 mg by mouth at bedtime as needed for itching.   0  . hyoscyamine (LEVSIN SL) 0.125 MG SL tablet dissolve 1 tablet under the tongue every 4 hours if needed (Patient taking differently: Take 0.125 mg by mouth every 4 (four) hours as needed for cramping. ) 30 tablet 0  . ibuprofen (ADVIL) 800 MG tablet Take 1 tablet (800 mg total) by mouth every 6 (six) hours as needed for moderate pain. 20 tablet 0  . LORazepam (ATIVAN) 0.5 MG tablet Take 1 tablet daily, as needed for nausea. 30 tablet 1  . metaxalone (SKELAXIN) 800 MG tablet Take 800 mg by mouth 3 (three) times daily.    . methocarbamol (ROBAXIN) 500 MG tablet Take 1 tablet (500 mg total) by mouth every 8 (eight) hours as needed for muscle spasms. 20 tablet 0  . Multiple Vitamin (MULITIVITAMIN WITH MINERALS) TABS Take 1 tablet by mouth daily.    . pantoprazole (PROTONIX) 40 MG tablet Take 1 tablet (40 mg total) by mouth 2 (two) times daily before a meal. (Patient taking differently: Take 40 mg by mouth daily. ) 60 tablet 6  . traZODone (DESYREL) 100 MG tablet Take 100 mg by mouth at bedtime.     . valACYclovir (VALTREX) 1000 MG tablet Take 1,000 mg by mouth 2 (two) times daily as needed (for out breaks).      No current facility-administered medications for this visit.     PHYSICAL EXAMINATION: ECOG PERFORMANCE STATUS: 1 - Symptomatic but completely  ambulatory  Vitals:   01/07/19 1502  BP: 125/81  Pulse: 95  Resp: 17  Temp: 98.5 F (36.9 C)  SpO2: 97%   Filed Weights   01/07/19 1502  Weight: 116 lb (52.6 kg)    GENERAL:alert, no distress and comfortable SKIN: skin color, texture, turgor are normal, no rashes or significant lesions EYES: normal, Conjunctiva are pink and non-injected, sclera clear OROPHARYNX:no exudate, no erythema and lips, buccal mucosa, and tongue normal  NECK: supple, thyroid normal size, non-tender, without nodularity LYMPH:  no palpable lymphadenopathy in the cervical, axillary or inguinal LUNGS: Crackles in the lungs HEART: regular rate & rhythm and no murmurs and no lower extremity edema ABDOMEN:abdomen soft, non-tender and normal bowel sounds MUSCULOSKELETAL:no cyanosis of digits and no clubbing  NEURO: alert & oriented x 3 with fluent speech, no focal motor/sensory deficits EXTREMITIES: No lower extremity edema   LABORATORY DATA:  I have reviewed the data as listed CMP Latest Ref Rng & Units 12/16/2018 12/09/2018 11/03/2018  Glucose 70 - 99 mg/dL 83 105(H) 129(H)  BUN 6 - 20 mg/dL 20 21(H) 20  Creatinine 0.44 -  1.00 mg/dL 0.77 0.83 0.74  Sodium 135 - 145 mmol/L 141 137 138  Potassium 3.5 - 5.1 mmol/L 4.1 3.3(L) 3.4(L)  Chloride 98 - 111 mmol/L 104 94(L) 100  CO2 22 - 32 mmol/L 25 30 31   Calcium 8.9 - 10.3 mg/dL 9.2 9.5 9.2  Total Protein 6.5 - 8.1 g/dL - 7.5 7.1  Total Bilirubin 0.3 - 1.2 mg/dL - 0.4 0.5  Alkaline Phos 38 - 126 U/L - 126 81  AST 15 - 41 U/L - 30 24  ALT 0 - 44 U/L - 22 16    Lab Results  Component Value Date   WBC 6.2 12/16/2018   HGB 13.5 12/16/2018   HCT 40.2 12/16/2018   MCV 88.4 12/16/2018   PLT 180 12/16/2018   NEUTROABS 4.1 12/16/2018    ASSESSMENT & PLAN:  Malignant neoplasm of lower-inner quadrant of right breast of female, estrogen receptor positive ([place]) 08/16/2017: Right lumpectomy: Grade 2 IDC 1.5 cm, with DCIS and necrosis, 0/3 lymph nodes negative, ER  95%, PR 30%, HER-2 negative ratio 1.14, Ki-67 40%, T1CN0 stage Ia Resection of the margins 09/11/2017: Benign  Treatment plan: 1. Systemic chemotherapy with dose dense Adriamycin and Cytoxan x4 followed by Taxol weekly x12 2.Adjuvant radiation therapy12/2/219-04/25/2018 3.followed by adjuvant antiestrogen therapy.  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 I would like her to obtain a biopsy.  Possible sites are pleural fluid versus bone versus liver  We will set her up for a thoracentesis on Thursday.  I discussed with interventional radiology and Dr. Annamaria Boots recommended getting a PET/CT and then decision on whether she needs a liver MRI or could proceed to get a biopsy.  If the thoracentesis is negative then we will see what the PET CT scan picks up in terms of additional areas of biopsy. If no other areas of biopsy are present then we may have to have her see pulmonary for bronchoscopy. It is also very possible that all of these changes are related to a viral infection. COVID-19 negative We will see her after the thoracentesis with the pathology report.    Orders Placed This Encounter  Procedures  . NM PET Image Initial (PI) Skull Base To Thigh    Standing Status:   Future    Standing Expiration Date:   01/07/2020    Order Specific Question:   ** REASON FOR EXAM (FREE TEXT)    Answer:   Pericardial and Pleural lesions suspicious for mets    Order Specific Question:   If indicated for the ordered procedure, I authorize the administration of a radiopharmaceutical per Radiology  protocol    Answer:   Yes    Order Specific Question:   Is the patient pregnant?    Answer:   No    Order Specific Question:   Preferred imaging location?    Answer:   [facility]    Order Specific Question:   Radiology Contrast Protocol - do NOT remove file path    Answer:   [email or web address]   The patient has a good understanding of the overall plan. she agrees with it. she will call with any problems that may develop before the next visit here.   Harriette Ohara, MD 01/07/19

## 2019-01-07 NOTE — Telephone Encounter (Signed)
PCCs, can you help with this? Order for thoracentesis has been placed 01/06/2019. Ordering provider says Dr. Johnsie Cancel only because we ordered it under his encounter from 01/06/2019. Order was actually placed by me and signed by CY. Please advise, thank you.

## 2019-01-07 NOTE — Telephone Encounter (Signed)
It looks like this was placed under Dr. Johnsie Cancel so it didn't hit our box to schedule.  Please cancel the previous order & place a new order to hit out box & we will schedule.  Thank you.

## 2019-01-07 NOTE — Telephone Encounter (Signed)
Looks like it has been schedule for 01/09/19 I have left a message for the patient

## 2019-01-07 NOTE — Assessment & Plan Note (Signed)
08/16/2017: Right lumpectomy: Grade 2 IDC 1.5 cm, with DCIS and necrosis, 0/3 lymph nodes negative, ER 95%, PR 30%, HER-2 negative ratio 1.14, Ki-67 40%, T1CN0 stage Ia Resection of the margins 09/11/2017: Benign  Treatment plan: 1. Systemic chemotherapy with dose dense Adriamycin and Cytoxan x4 followed by Taxol weekly x12 2.Adjuvant radiation therapy12/2/219-04/25/2018 3.followed by adjuvant antiestrogen therapy.  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 I would like her to obtain a biopsy.  Possible sites are pleural fluid versus bone versus liver  I discussed with interventional radiology and Dr. Annamaria Boots recommended getting a PET/CT and then decision on whether she needs a liver MRI or could proceed to get a biopsy.  Return to clinic after the PET scan.

## 2019-01-07 NOTE — Telephone Encounter (Signed)
Order has been updated. Thanks.

## 2019-01-07 NOTE — Telephone Encounter (Deleted)
I see a thoracentesis that says it's order by Dr. Johnsie Cancel.Marland KitchenMarland Kitchen

## 2019-01-07 NOTE — Telephone Encounter (Signed)
Message taken by Raquel Sarna, LPN and completed. See 01/06/19 message with Dr. Johnsie Cancel.

## 2019-01-08 ENCOUNTER — Other Ambulatory Visit: Payer: 59

## 2019-01-08 ENCOUNTER — Other Ambulatory Visit: Payer: Self-pay

## 2019-01-08 ENCOUNTER — Telehealth: Payer: Self-pay | Admitting: Hematology and Oncology

## 2019-01-08 ENCOUNTER — Other Ambulatory Visit (HOSPITAL_COMMUNITY)
Admission: RE | Admit: 2019-01-08 | Discharge: 2019-01-08 | Disposition: A | Discharge status: Home / Self Care | Payer: 59 | Source: Ambulatory Visit | Attending: Internal Medicine | Admitting: Internal Medicine

## 2019-01-08 ENCOUNTER — Ambulatory Visit (HOSPITAL_COMMUNITY): Payer: 59

## 2019-01-08 DIAGNOSIS — C50311 Malignant neoplasm of lower-inner quadrant of right female breast: Secondary | ICD-10-CM | POA: Diagnosis not present

## 2019-01-08 DIAGNOSIS — Z170 Estrogen receptor positive status [ER+]: Secondary | ICD-10-CM | POA: Diagnosis not present

## 2019-01-08 DIAGNOSIS — Z20828 Contact with and (suspected) exposure to other viral communicable diseases: Secondary | ICD-10-CM | POA: Insufficient documentation

## 2019-01-08 NOTE — Telephone Encounter (Signed)
I talk with patient regarding 10/8

## 2019-01-09 ENCOUNTER — Ambulatory Visit (HOSPITAL_COMMUNITY)
Admission: RE | Admit: 2019-01-09 | Discharge: 2019-01-09 | Disposition: A | Discharge status: Home / Self Care | Payer: 59 | Source: Ambulatory Visit | Attending: Internal Medicine | Admitting: Internal Medicine

## 2019-01-09 ENCOUNTER — Other Ambulatory Visit: Payer: Self-pay

## 2019-01-09 ENCOUNTER — Telehealth: Payer: Self-pay | Admitting: Hematology

## 2019-01-09 ENCOUNTER — Other Ambulatory Visit: Payer: Self-pay | Admitting: Internal Medicine

## 2019-01-09 ENCOUNTER — Encounter (HOSPITAL_COMMUNITY)
Admission: RE | Admit: 2019-01-09 | Discharge: 2019-01-09 | Disposition: A | Discharge status: Home / Self Care | Payer: 59 | Source: Ambulatory Visit | Attending: Hematology and Oncology | Admitting: Hematology and Oncology

## 2019-01-09 DIAGNOSIS — J90 Pleural effusion, not elsewhere classified: Secondary | ICD-10-CM | POA: Diagnosis not present

## 2019-01-09 DIAGNOSIS — C50919 Malignant neoplasm of unspecified site of unspecified female breast: Secondary | ICD-10-CM

## 2019-01-09 LAB — GLUCOSE, CAPILLARY: Glucose-Capillary: 95 mg/dL (ref 70–99)

## 2019-01-09 LAB — SARS CORONAVIRUS 2 (TAT 6-24 HRS): SARS Coronavirus 2: NEGATIVE

## 2019-01-09 MED ORDER — LIDOCAINE HCL 1 % IJ SOLN
INTRAMUSCULAR | Status: AC
  Filled 2019-01-09: qty 20

## 2019-01-09 MED ORDER — FLUDEOXYGLUCOSE F - 18 (FDG) INJECTION
6.1000 | Freq: Once | INTRAVENOUS | Status: AC | PRN
  Administered 2019-01-09: 15:00:00 6.1 via INTRAVENOUS

## 2019-01-09 NOTE — Telephone Encounter (Signed)
Pt's thoracentesis has been scheduled for today 01/09/2019. Nothing further needed at this time. Will close out this encounter.

## 2019-01-09 NOTE — Telephone Encounter (Signed)
Pt is aware covid 19 test is negative °

## 2019-01-09 NOTE — Progress Notes (Signed)
Patient Care Team: Aletha Halim., PA-C as PCP - General (Family Medicine) Nicholas Lose, MD as Consulting Physician (Hematology and Oncology) Kyung Rudd, MD as Consulting Physician (Radiation Oncology) Rolm Bookbinder, MD as Consulting Physician (General Surgery)  DIAGNOSIS:    ICD-10-CM   1. Malignant neoplasm of lower-inner quadrant of right breast of female, estrogen receptor positive ([place])  C50.311    Z17.0     SUMMARY OF ONCOLOGIC HISTORY: Oncology History  Malignant neoplasm of lower-inner quadrant of right breast of female, estrogen receptor positive ([place])  08/16/2017 Initial Diagnosis   Right lumpectomy: 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  High risk   08/16/2017 Cancer Staging   Staging form: Breast, AJCC 8th Edition - Pathologic stage from 08/16/2017: Stage IA (pT1c, pN0, cM0, G2, ER+, PR+, HER2-, Oncotype DX score: 31) - Signed by Gardenia Phlegm, NP on 09/12/2018   09/25/2017 - 02/05/2018 Chemotherapy   Adjuvant chemotherapy with dose dense Adriamycin and Cytoxan x4 followed by Taxol weekly x12    03/11/2018 - 04/25/2018 Radiation Therapy   Adjuvant XRT   05/2018 -  Anti-estrogen oral therapy   Anastrozole daily, plan for 7 years     CHIEF COMPLIANT: Follow-up to review recent scans  INTERVAL HISTORY: Jasmine Allen is a 53 y.o. with above-mentioned history of right breast cancer treated with lumpectomy, adjuvant chemotherapy, and radiation. 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  They attempted to do thoracentesis but there was not enough fluid to do it. PET CT scan was performed and the results officially are  pending.  My review reveals an infiltrate in the right lung along the pleura.  Uncertain etiology.  It is hypermetabolic.  REVIEW OF SYSTEMS:   Constitutional: Denies fevers, chills or abnormal weight loss Eyes: Denies blurriness of vision Ears, nose, mouth, throat, and face: Denies mucositis or sore throat Respiratory: Chronic and persistent cough and shortness of breath Cardiovascular: Denies palpitation, chest discomfort Gastrointestinal: Denies nausea, heartburn or change in bowel habits Skin: Denies abnormal skin rashes Lymphatics: Denies new lymphadenopathy or easy bruising Neurological: Denies numbness, tingling or new weaknesses Behavioral/Psych: Mood is stable, no new changes  Extremities: No lower extremity edema Breast: denies any pain or lumps or nodules in either breasts All other systems were reviewed with the patient and are negative.  I have reviewed the past medical history, past surgical history, social history and family history with the patient and they are unchanged from previous note.  ALLERGIES:  is allergic to amoxicillin-pot clavulanate; erythromycin; metoclopramide hcl; and sulfonamide derivatives.  MEDICATIONS:  Current Outpatient Medications  Medication Sig Dispense Refill  . albuterol (VENTOLIN HFA) 108 (90 Base) MCG/ACT inhaler Inhale 1 puff into the lungs every 6 (six) hours as needed for wheezing or shortness of breath. 6.7 g 0  . ALPRAZolam (XANAX) 0.5 MG tablet Take 0.5 mg by mouth at bedtime as needed for anxiety or sleep.     Marland Kitchen atorvastatin (LIPITOR) 20 MG tablet Take 20 mg by mouth daily.    . Biotin 10000 MCG TABS Take 10,000 mcg by mouth daily.     . bumetanide (BUMEX) 1 MG tablet TAKE 1 TABLET(1 MG) BY  MOUTH TWICE DAILY 60 tablet 2  . cetirizine (ZYRTEC) 10 MG tablet Take 10 mg by mouth daily.    . chlorpheniramine-HYDROcodone (TUSSIONEX) 10-8 MG/5ML SUER Take 5 mLs by mouth 2 (two) times daily. 140 mL 0  . cloNIDine (CATAPRES) 0.3 MG tablet Take  0.3 mg by mouth at bedtime.     . cyclobenzaprine (FLEXERIL) 10 MG tablet Take 10 mg by mouth every 8 (eight) hours as needed for muscle spasms.  1  . Fluticasone-Umeclidin-Vilant (TRELEGY ELLIPTA) 100-62.5-25 MCG/INH AEPB Inhale 1 puff into the lungs daily. 1 each 0  . HYDROcodone-acetaminophen (NORCO/VICODIN) 5-325 MG tablet Take 1 tablet by mouth every 12 (twelve) hours as needed. 45 tablet 0  . hydrOXYzine (ATARAX/VISTARIL) 10 MG tablet Take 10-30 mg by mouth at bedtime as needed for itching.   0  . hyoscyamine (LEVSIN SL) 0.125 MG SL tablet dissolve 1 tablet under the tongue every 4 hours if needed (Patient taking differently: Take 0.125 mg by mouth every 4 (four) hours as needed for cramping. ) 30 tablet 0  . ibuprofen (ADVIL) 800 MG tablet Take 1 tablet (800 mg total) by mouth every 6 (six) hours as needed for moderate pain. 20 tablet 0  . LORazepam (ATIVAN) 0.5 MG tablet Take 1 tablet daily, as needed for nausea. 30 tablet 1  . metaxalone (SKELAXIN) 800 MG tablet Take 800 mg by mouth 3 (three) times daily.    . methocarbamol (ROBAXIN) 500 MG tablet Take 1 tablet (500 mg total) by mouth every 8 (eight) hours as needed for muscle spasms. 20 tablet 0  . Multiple Vitamin (MULITIVITAMIN WITH MINERALS) TABS Take 1 tablet by mouth daily.    . pantoprazole (PROTONIX) 40 MG tablet Take 1 tablet (40 mg total) by mouth 2 (two) times daily before a meal. (Patient taking differently: Take 40 mg by mouth daily. ) 60 tablet 6  . traZODone (DESYREL) 100 MG tablet Take 100 mg by mouth at bedtime.     . valACYclovir (VALTREX) 1000 MG tablet Take 1,000 mg by mouth 2 (two) times daily as needed (for out breaks).      No current facility-administered medications for this visit.     PHYSICAL EXAMINATION: ECOG PERFORMANCE STATUS: 1 - Symptomatic but completely ambulatory  Vitals:   01/10/19 1040  BP: 122/81  Pulse: 93  Resp: 18  Temp: 98.7 F (37.1 C)  SpO2: 92%   Filed Weights   01/10/19 1040   Weight: 114 lb 12.8 oz (52.1 kg)    GENERAL: alert, no distress and comfortable SKIN: skin color, texture, turgor are normal, no rashes or significant lesions EYES: normal, Conjunctiva are pink and non-injected, sclera clear OROPHARYNX: no exudate, no erythema and lips, buccal mucosa, and tongue normal  NECK: supple, thyroid normal size, non-tender, without nodularity LYMPH: no palpable lymphadenopathy in the cervical, axillary or inguinal LUNGS: clear to auscultation and percussion with normal breathing effort HEART: regular rate & rhythm and no murmurs and no lower extremity edema ABDOMEN: abdomen soft, non-tender and normal bowel sounds MUSCULOSKELETAL: no cyanosis of digits and no clubbing  NEURO: alert & oriented x 3 with fluent speech, no focal motor/sensory deficits EXTREMITIES: No lower extremity edema  LABORATORY DATA:  I have reviewed the data as listed CMP Latest Ref Rng & Units 12/16/2018 12/09/2018 11/03/2018  Glucose 70 - 99 mg/dL 83 105(H) 129(H)  BUN 6 - 20 mg/dL 20 21(H) 20  Creatinine 0.44 - 1.00 mg/dL 0.77 0.83 0.74  Sodium 135 - 145 mmol/L  141 137 138  Potassium 3.5 - 5.1 mmol/L 4.1 3.3(L) 3.4(L)  Chloride 98 - 111 mmol/L 104 94(L) 100  CO2 22 - 32 mmol/L 25 30 31   Calcium 8.9 - 10.3 mg/dL 9.2 9.5 9.2  Total Protein 6.5 - 8.1 g/dL - 7.5 7.1  Total Bilirubin 0.3 - 1.2 mg/dL - 0.4 0.5  Alkaline Phos 38 - 126 U/L - 126 81  AST 15 - 41 U/L - 30 24  ALT 0 - 44 U/L - 22 16    Lab Results  Component Value Date   WBC 6.2 12/16/2018   HGB 13.5 12/16/2018   HCT 40.2 12/16/2018   MCV 88.4 12/16/2018   PLT 180 12/16/2018   NEUTROABS 4.1 12/16/2018    ASSESSMENT & PLAN:  Malignant neoplasm of lower-inner quadrant of right breast of female, estrogen receptor positive ([place]) 08/16/2017: Right lumpectomy: Grade 2 IDC 1.5 cm, with DCIS and necrosis, 0/3 lymph nodes negative, ER 95%, PR 30%, HER-2 negative ratio 1.14, Ki-67 40%, T1CN0 stage Ia Resection of the margins  09/11/2017: Benign  Treatment plan: 1. 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  Lung infiltrate is suspicious for lymphangitic spread versus infection.  Bone marrow hypermetabolic activity and sclerotic lesions in the bones are raising concerns for bone metastases.  The highest suspicion is in the left lobe of the liver. Thoracentesis 01/09/2019: Procedure did not happen because there was not enough fluid  Plan: 1.  Liver MRI for further characterization of the left lobe of the liver lesion and then contact interventional radiology for a biopsy 2.  Pulmonary consultation Dr. Valeta Harms for bronchoscopy and biopsy of the lung infiltrates 3.  If none of these are possible then we will consider biopsying the bone. We discussed that diffuse bone marrow activity.  This is suspicious for malignancy however there is absolutely nothing wrong on the CBC and it makes me wonder if it is physiologic activity. 4.  Bactrim and valacyclovir are prescribed in case this was a viral or bacterial infection.  Return to clinic after biopsies to discuss pathology report.  No orders of the defined types were placed in this encounter.  The patient has a good understanding of the overall plan. she agrees with it. she will call with any problems that may develop before  the next visit here.  Nicholas Lose, MD 01/10/2019  Julious Oka Dorshimer am acting as scribe for Dr. Nicholas Lose.  I have reviewed the above documentation for accuracy and completeness, and I agree with the above.

## 2019-01-10 ENCOUNTER — Ambulatory Visit (HOSPITAL_COMMUNITY): Payer: 59

## 2019-01-10 ENCOUNTER — Other Ambulatory Visit: Payer: Self-pay

## 2019-01-10 ENCOUNTER — Inpatient Hospital Stay: Payer: 59 | Attending: Adult Health | Admitting: Hematology and Oncology

## 2019-01-10 DIAGNOSIS — C773 Secondary and unspecified malignant neoplasm of axilla and upper limb lymph nodes: Secondary | ICD-10-CM | POA: Insufficient documentation

## 2019-01-10 DIAGNOSIS — Z79899 Other long term (current) drug therapy: Secondary | ICD-10-CM | POA: Insufficient documentation

## 2019-01-10 DIAGNOSIS — C50311 Malignant neoplasm of lower-inner quadrant of right female breast: Secondary | ICD-10-CM | POA: Insufficient documentation

## 2019-01-10 DIAGNOSIS — Z79811 Long term (current) use of aromatase inhibitors: Secondary | ICD-10-CM | POA: Insufficient documentation

## 2019-01-10 DIAGNOSIS — Z170 Estrogen receptor positive status [ER+]: Secondary | ICD-10-CM | POA: Insufficient documentation

## 2019-01-10 DIAGNOSIS — Z923 Personal history of irradiation: Secondary | ICD-10-CM | POA: Insufficient documentation

## 2019-01-10 DIAGNOSIS — Z791 Long term (current) use of non-steroidal anti-inflammatories (NSAID): Secondary | ICD-10-CM | POA: Insufficient documentation

## 2019-01-10 DIAGNOSIS — C770 Secondary and unspecified malignant neoplasm of lymph nodes of head, face and neck: Secondary | ICD-10-CM | POA: Insufficient documentation

## 2019-01-10 DIAGNOSIS — D696 Thrombocytopenia, unspecified: Secondary | ICD-10-CM | POA: Insufficient documentation

## 2019-01-10 DIAGNOSIS — C7801 Secondary malignant neoplasm of right lung: Secondary | ICD-10-CM | POA: Insufficient documentation

## 2019-01-10 DIAGNOSIS — K769 Liver disease, unspecified: Secondary | ICD-10-CM | POA: Insufficient documentation

## 2019-01-10 DIAGNOSIS — Z9889 Other specified postprocedural states: Secondary | ICD-10-CM | POA: Diagnosis not present

## 2019-01-10 DIAGNOSIS — R918 Other nonspecific abnormal finding of lung field: Secondary | ICD-10-CM | POA: Insufficient documentation

## 2019-01-10 DIAGNOSIS — Z9221 Personal history of antineoplastic chemotherapy: Secondary | ICD-10-CM | POA: Insufficient documentation

## 2019-01-10 MED ORDER — SULFAMETHOXAZOLE-TRIMETHOPRIM 800-160 MG PO TABS: 1.0000 | ORAL_TABLET | Freq: Two times a day (BID) | ORAL | 0 refills | Status: DC

## 2019-01-10 NOTE — Assessment & Plan Note (Signed)
08/16/2017: Right lumpectomy: Grade 2 IDC 1.5 cm, with DCIS and necrosis, 0/3 lymph nodes negative, ER 95%, PR 30%, HER-2 negative ratio 1.14, Ki-67 40%, T1CN0 stage Ia Resection of the margins 09/11/2017: Benign  Treatment plan: 1. 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

## 2019-01-13 ENCOUNTER — Other Ambulatory Visit: Payer: Self-pay

## 2019-01-13 ENCOUNTER — Emergency Department (HOSPITAL_COMMUNITY): Payer: 59

## 2019-01-13 ENCOUNTER — Inpatient Hospital Stay (HOSPITAL_COMMUNITY)
Admission: EM | Admit: 2019-01-13 | Discharge: 2019-01-16 | DRG: 180 | Disposition: A | Discharge status: Home / Self Care | Payer: 59 | Source: Ambulatory Visit | Attending: Internal Medicine | Admitting: Internal Medicine

## 2019-01-13 ENCOUNTER — Telehealth: Payer: Self-pay | Admitting: Hematology and Oncology

## 2019-01-13 ENCOUNTER — Telehealth: Payer: Self-pay

## 2019-01-13 ENCOUNTER — Encounter (HOSPITAL_COMMUNITY): Payer: Self-pay | Admitting: Emergency Medicine

## 2019-01-13 DIAGNOSIS — Z170 Estrogen receptor positive status [ER+]: Secondary | ICD-10-CM

## 2019-01-13 DIAGNOSIS — Z853 Personal history of malignant neoplasm of breast: Secondary | ICD-10-CM | POA: Diagnosis not present

## 2019-01-13 DIAGNOSIS — R600 Localized edema: Secondary | ICD-10-CM | POA: Diagnosis present

## 2019-01-13 DIAGNOSIS — Z8249 Family history of ischemic heart disease and other diseases of the circulatory system: Secondary | ICD-10-CM

## 2019-01-13 DIAGNOSIS — J910 Malignant pleural effusion: Secondary | ICD-10-CM | POA: Diagnosis present

## 2019-01-13 DIAGNOSIS — I10 Essential (primary) hypertension: Secondary | ICD-10-CM | POA: Diagnosis present

## 2019-01-13 DIAGNOSIS — J9601 Acute respiratory failure with hypoxia: Secondary | ICD-10-CM | POA: Diagnosis present

## 2019-01-13 DIAGNOSIS — Z9221 Personal history of antineoplastic chemotherapy: Secondary | ICD-10-CM

## 2019-01-13 DIAGNOSIS — Z87891 Personal history of nicotine dependence: Secondary | ICD-10-CM

## 2019-01-13 DIAGNOSIS — D61818 Other pancytopenia: Secondary | ICD-10-CM | POA: Diagnosis present

## 2019-01-13 DIAGNOSIS — J189 Pneumonia, unspecified organism: Secondary | ICD-10-CM | POA: Diagnosis present

## 2019-01-13 DIAGNOSIS — D464 Refractory anemia, unspecified: Secondary | ICD-10-CM | POA: Diagnosis present

## 2019-01-13 DIAGNOSIS — Z79811 Long term (current) use of aromatase inhibitors: Secondary | ICD-10-CM

## 2019-01-13 DIAGNOSIS — F329 Major depressive disorder, single episode, unspecified: Secondary | ICD-10-CM | POA: Diagnosis present

## 2019-01-13 DIAGNOSIS — J90 Pleural effusion, not elsewhere classified: Secondary | ICD-10-CM

## 2019-01-13 DIAGNOSIS — C7800 Secondary malignant neoplasm of unspecified lung: Secondary | ICD-10-CM | POA: Diagnosis not present

## 2019-01-13 DIAGNOSIS — C7802 Secondary malignant neoplasm of left lung: Secondary | ICD-10-CM | POA: Diagnosis present

## 2019-01-13 DIAGNOSIS — Z9889 Other specified postprocedural states: Secondary | ICD-10-CM

## 2019-01-13 DIAGNOSIS — Z881 Allergy status to other antibiotic agents status: Secondary | ICD-10-CM

## 2019-01-13 DIAGNOSIS — Z882 Allergy status to sulfonamides status: Secondary | ICD-10-CM

## 2019-01-13 DIAGNOSIS — F411 Generalized anxiety disorder: Secondary | ICD-10-CM | POA: Diagnosis present

## 2019-01-13 DIAGNOSIS — Z880 Allergy status to penicillin: Secondary | ICD-10-CM

## 2019-01-13 DIAGNOSIS — C787 Secondary malignant neoplasm of liver and intrahepatic bile duct: Secondary | ICD-10-CM | POA: Diagnosis present

## 2019-01-13 DIAGNOSIS — Z803 Family history of malignant neoplasm of breast: Secondary | ICD-10-CM

## 2019-01-13 DIAGNOSIS — R918 Other nonspecific abnormal finding of lung field: Secondary | ICD-10-CM

## 2019-01-13 DIAGNOSIS — C50311 Malignant neoplasm of lower-inner quadrant of right female breast: Secondary | ICD-10-CM | POA: Diagnosis not present

## 2019-01-13 DIAGNOSIS — R071 Chest pain on breathing: Secondary | ICD-10-CM | POA: Diagnosis not present

## 2019-01-13 DIAGNOSIS — C7952 Secondary malignant neoplasm of bone marrow: Secondary | ICD-10-CM | POA: Diagnosis present

## 2019-01-13 DIAGNOSIS — C7801 Secondary malignant neoplasm of right lung: Principal | ICD-10-CM | POA: Diagnosis present

## 2019-01-13 DIAGNOSIS — Z923 Personal history of irradiation: Secondary | ICD-10-CM | POA: Diagnosis not present

## 2019-01-13 DIAGNOSIS — E785 Hyperlipidemia, unspecified: Secondary | ICD-10-CM | POA: Diagnosis present

## 2019-01-13 DIAGNOSIS — C7951 Secondary malignant neoplasm of bone: Secondary | ICD-10-CM | POA: Diagnosis present

## 2019-01-13 DIAGNOSIS — K219 Gastro-esophageal reflux disease without esophagitis: Secondary | ICD-10-CM | POA: Diagnosis present

## 2019-01-13 DIAGNOSIS — R0600 Dyspnea, unspecified: Secondary | ICD-10-CM | POA: Diagnosis present

## 2019-01-13 LAB — COMPREHENSIVE METABOLIC PANEL
ALT: 32 U/L (ref 0–44)
AST: 85 U/L — ABNORMAL HIGH (ref 15–41)
Albumin: 3.3 g/dL — ABNORMAL LOW (ref 3.5–5.0)
Alkaline Phosphatase: 400 U/L — ABNORMAL HIGH (ref 38–126)
Anion gap: 9 (ref 5–15)
BUN: 17 mg/dL (ref 6–20)
CO2: 28 mmol/L (ref 22–32)
Calcium: 9 mg/dL (ref 8.9–10.3)
Chloride: 97 mmol/L — ABNORMAL LOW (ref 98–111)
Creatinine, Ser: 0.93 mg/dL (ref 0.44–1.00)
GFR calc Af Amer: >60 mL/min (ref >60)
GFR calc non Af Amer: >60 mL/min (ref >60)
Glucose, Bld: 108 mg/dL — ABNORMAL HIGH (ref 70–99)
Potassium: 4 mmol/L (ref 3.5–5.1)
Sodium: 134 mmol/L — ABNORMAL LOW (ref 135–145)
Total Bilirubin: 0.5 mg/dL (ref 0.3–1.2)
Total Protein: 6.7 g/dL (ref 6.5–8.1)

## 2019-01-13 LAB — CBC WITH DIFFERENTIAL/PLATELET
Abs Immature Granulocytes: 0.18 10*3/uL — ABNORMAL HIGH (ref 0.00–0.07)
Basophils Absolute: 0 10*3/uL (ref 0.0–0.1)
Basophils Relative: 1 %
Eosinophils Absolute: 0 10*3/uL (ref 0.0–0.5)
Eosinophils Relative: 1 %
HCT: 37.7 % (ref 36.0–46.0)
Hemoglobin: 12.9 g/dL (ref 12.0–15.0)
Immature Granulocytes: 5 %
Lymphocytes Relative: 23 %
Lymphs Abs: 0.9 10*3/uL (ref 0.7–4.0)
MCH: 29.1 pg (ref 26.0–34.0)
MCHC: 34.2 g/dL (ref 30.0–36.0)
MCV: 85.1 fL (ref 80.0–100.0)
Monocytes Absolute: 0.3 10*3/uL (ref 0.1–1.0)
Monocytes Relative: 8 %
Neutro Abs: 2.5 10*3/uL (ref 1.7–7.7)
Neutrophils Relative %: 62 %
Platelets: 95 10*3/uL — ABNORMAL LOW (ref 150–400)
RBC: 4.43 MIL/uL (ref 3.87–5.11)
RDW: 13.2 % (ref 11.5–15.5)
WBC: 4 10*3/uL (ref 4.0–10.5)
nRBC: 0.5 % — ABNORMAL HIGH (ref 0.0–0.2)

## 2019-01-13 LAB — PROTEIN, PLEURAL OR PERITONEAL FLUID: Total protein, fluid: 3 g/dL

## 2019-01-13 LAB — BODY FLUID CELL COUNT WITH DIFFERENTIAL
Lymphs, Fluid: 64 %
Monocyte-Macrophage-Serous Fluid: 28 % — ABNORMAL LOW (ref 50–90)
Neutrophil Count, Fluid: 8 % (ref 0–25)
Total Nucleated Cell Count, Fluid: 425 cu mm (ref 0–1000)

## 2019-01-13 LAB — LACTATE DEHYDROGENASE, PLEURAL OR PERITONEAL FLUID: LD, Fluid: 204 U/L — ABNORMAL HIGH (ref 3–23)

## 2019-01-13 LAB — ALBUMIN, PLEURAL OR PERITONEAL FLUID: Albumin, Fluid: 2.1 g/dL

## 2019-01-13 MED ORDER — ONDANSETRON HCL 4 MG/2ML IJ SOLN: 4.0000 mg | Freq: Four times a day (QID) | INTRAMUSCULAR | Status: DC | PRN

## 2019-01-13 MED ORDER — FLEET ENEMA 7-19 GM/118ML RE ENEM: 1.0000 | ENEMA | Freq: Once | RECTAL | Status: DC | PRN

## 2019-01-13 MED ORDER — BIOTIN 10000 MCG PO TABS: 10000.0000 ug | ORAL_TABLET | Freq: Every day | ORAL | Status: DC

## 2019-01-13 MED ORDER — ALBUTEROL SULFATE (2.5 MG/3ML) 0.083% IN NEBU: 2.5000 mg | INHALATION_SOLUTION | Freq: Four times a day (QID) | RESPIRATORY_TRACT | Status: DC | PRN

## 2019-01-13 MED ORDER — SORBITOL 70 % SOLN
30.0000 mL | Freq: Every day | Status: DC | PRN
  Filled 2019-01-13: qty 30

## 2019-01-13 MED ORDER — ATORVASTATIN CALCIUM 20 MG PO TABS
20.0000 mg | ORAL_TABLET | Freq: Every day | ORAL | Status: DC
  Administered 2019-01-13 – 2019-01-15 (×3): 20 mg via ORAL
  Filled 2019-01-13 (×3): qty 1

## 2019-01-13 MED ORDER — MAGNESIUM HYDROXIDE 400 MG/5ML PO SUSP
30.0000 mL | Freq: Every day | ORAL | Status: DC | PRN
  Administered 2019-01-15: 30 mL via ORAL
  Filled 2019-01-13: qty 30

## 2019-01-13 MED ORDER — LORATADINE 10 MG PO TABS
10.0000 mg | ORAL_TABLET | Freq: Every day | ORAL | Status: DC
  Administered 2019-01-13 – 2019-01-16 (×4): 10 mg via ORAL
  Filled 2019-01-13 (×4): qty 1

## 2019-01-13 MED ORDER — SULFAMETHOXAZOLE-TRIMETHOPRIM 800-160 MG PO TABS
1.0000 | ORAL_TABLET | Freq: Two times a day (BID) | ORAL | Status: DC
  Administered 2019-01-13 – 2019-01-16 (×6): 1 via ORAL
  Filled 2019-01-13 (×6): qty 1

## 2019-01-13 MED ORDER — METHYLPREDNISOLONE SODIUM SUCC 40 MG IJ SOLR
40.0000 mg | Freq: Two times a day (BID) | INTRAMUSCULAR | Status: DC
  Administered 2019-01-14: 40 mg via INTRAVENOUS
  Filled 2019-01-13: qty 1

## 2019-01-13 MED ORDER — BENZONATATE 100 MG PO CAPS
100.0000 mg | ORAL_CAPSULE | Freq: Three times a day (TID) | ORAL | Status: DC
  Administered 2019-01-13 – 2019-01-16 (×8): 100 mg via ORAL
  Filled 2019-01-13 (×8): qty 1

## 2019-01-13 MED ORDER — CYCLOBENZAPRINE HCL 10 MG PO TABS
10.0000 mg | ORAL_TABLET | Freq: Three times a day (TID) | ORAL | Status: DC | PRN
  Administered 2019-01-14: 10 mg via ORAL
  Filled 2019-01-13: qty 1

## 2019-01-13 MED ORDER — FENTANYL CITRATE (PF) 100 MCG/2ML IJ SOLN
50.0000 ug | Freq: Once | INTRAMUSCULAR | Status: AC
  Administered 2019-01-13: 50 ug via INTRAVENOUS
  Filled 2019-01-13: qty 2

## 2019-01-13 MED ORDER — IPRATROPIUM-ALBUTEROL 0.5-2.5 (3) MG/3ML IN SOLN
3.0000 mL | Freq: Three times a day (TID) | RESPIRATORY_TRACT | Status: DC
  Administered 2019-01-13 – 2019-01-14 (×2): 3 mL via RESPIRATORY_TRACT
  Filled 2019-01-13 (×2): qty 3

## 2019-01-13 MED ORDER — HYDROCODONE-ACETAMINOPHEN 5-325 MG PO TABS: 1.0000 | ORAL_TABLET | Freq: Four times a day (QID) | ORAL | Status: DC | PRN

## 2019-01-13 MED ORDER — MORPHINE SULFATE (PF) 4 MG/ML IV SOLN
4.0000 mg | INTRAVENOUS | Status: DC | PRN
  Administered 2019-01-13 – 2019-01-16 (×8): 4 mg via INTRAVENOUS
  Filled 2019-01-13 (×8): qty 1

## 2019-01-13 MED ORDER — HYDROXYZINE HCL 10 MG PO TABS
10.0000 mg | ORAL_TABLET | Freq: Every evening | ORAL | Status: DC | PRN
  Administered 2019-01-15: 22:00:00 30 mg via ORAL
  Filled 2019-01-13 (×2): qty 3

## 2019-01-13 MED ORDER — LORAZEPAM 0.5 MG PO TABS: 0.5000 mg | ORAL_TABLET | Freq: Every evening | ORAL | Status: DC | PRN

## 2019-01-13 MED ORDER — ENOXAPARIN SODIUM 40 MG/0.4ML ~~LOC~~ SOLN
40.0000 mg | SUBCUTANEOUS | Status: DC
  Filled 2019-01-13 (×3): qty 0.4

## 2019-01-13 MED ORDER — KETOROLAC TROMETHAMINE 30 MG/ML IJ SOLN
30.0000 mg | Freq: Four times a day (QID) | INTRAMUSCULAR | Status: DC
  Administered 2019-01-13 – 2019-01-14 (×2): 30 mg via INTRAVENOUS
  Filled 2019-01-13 (×2): qty 1

## 2019-01-13 MED ORDER — HYDROCODONE-HOMATROPINE 5-1.5 MG/5ML PO SYRP
5.0000 mL | ORAL_SOLUTION | Freq: Four times a day (QID) | ORAL | Status: DC | PRN
  Administered 2019-01-14: 5 mL via ORAL
  Filled 2019-01-13: qty 5

## 2019-01-13 MED ORDER — PANTOPRAZOLE SODIUM 40 MG PO TBEC
40.0000 mg | DELAYED_RELEASE_TABLET | Freq: Every day | ORAL | Status: DC
  Administered 2019-01-13 – 2019-01-16 (×4): 40 mg via ORAL
  Filled 2019-01-13 (×4): qty 1

## 2019-01-13 MED ORDER — VALACYCLOVIR HCL 500 MG PO TABS: 1000.0000 mg | ORAL_TABLET | Freq: Two times a day (BID) | ORAL | Status: DC | PRN

## 2019-01-13 MED ORDER — TRAZODONE HCL 100 MG PO TABS
100.0000 mg | ORAL_TABLET | Freq: Every evening | ORAL | Status: DC | PRN
  Administered 2019-01-13: 100 mg via ORAL
  Filled 2019-01-13: qty 1

## 2019-01-13 MED ORDER — ONDANSETRON HCL 4 MG PO TABS: 4.0000 mg | ORAL_TABLET | Freq: Four times a day (QID) | ORAL | Status: DC | PRN

## 2019-01-13 MED ORDER — ALPRAZOLAM 0.25 MG PO TABS
0.2500 mg | ORAL_TABLET | Freq: Three times a day (TID) | ORAL | Status: DC | PRN
  Administered 2019-01-13 – 2019-01-16 (×6): 0.25 mg via ORAL
  Filled 2019-01-13 (×6): qty 1

## 2019-01-13 MED ORDER — ALPRAZOLAM 0.5 MG PO TABS: 0.5000 mg | ORAL_TABLET | Freq: Every evening | ORAL | Status: DC | PRN

## 2019-01-13 MED ORDER — SENNA 8.6 MG PO TABS
1.0000 | ORAL_TABLET | Freq: Two times a day (BID) | ORAL | Status: DC
  Administered 2019-01-13 – 2019-01-16 (×5): 8.6 mg via ORAL
  Filled 2019-01-13 (×6): qty 1

## 2019-01-13 MED ORDER — OXYCODONE-ACETAMINOPHEN 5-325 MG PO TABS: 1.0000 | ORAL_TABLET | ORAL | Status: DC | PRN

## 2019-01-13 MED ORDER — ALPRAZOLAM 0.25 MG PO TABS: 0.2500 mg | ORAL_TABLET | Freq: Three times a day (TID) | ORAL | Status: DC | PRN

## 2019-01-13 MED ORDER — CLONIDINE HCL 0.2 MG PO TABS
0.3000 mg | ORAL_TABLET | Freq: Every day | ORAL | Status: DC
  Administered 2019-01-13 – 2019-01-15 (×3): 0.3 mg via ORAL
  Filled 2019-01-13 (×3): qty 1

## 2019-01-13 MED ORDER — DIAZEPAM 5 MG PO TABS
5.0000 mg | ORAL_TABLET | Freq: Once | ORAL | Status: AC
  Administered 2019-01-13: 5 mg via ORAL
  Filled 2019-01-13: qty 1

## 2019-01-13 MED ORDER — HYDROCOD POLST-CPM POLST ER 10-8 MG/5ML PO SUER
5.0000 mL | Freq: Two times a day (BID) | ORAL | Status: DC
  Administered 2019-01-13 – 2019-01-16 (×6): 5 mL via ORAL
  Filled 2019-01-13 (×6): qty 5

## 2019-01-13 MED ORDER — SODIUM CHLORIDE 0.9% FLUSH
3.0000 mL | Freq: Two times a day (BID) | INTRAVENOUS | Status: DC
  Administered 2019-01-13 – 2019-01-16 (×6): 3 mL via INTRAVENOUS

## 2019-01-13 MED ORDER — ACETAMINOPHEN 650 MG RE SUPP: 650.0000 mg | Freq: Four times a day (QID) | RECTAL | Status: DC | PRN

## 2019-01-13 MED ORDER — ACETAMINOPHEN 325 MG PO TABS: 650.0000 mg | ORAL_TABLET | Freq: Four times a day (QID) | ORAL | Status: DC | PRN

## 2019-01-13 MED ORDER — BUMETANIDE 1 MG PO TABS
1.0000 mg | ORAL_TABLET | Freq: Two times a day (BID) | ORAL | Status: DC
  Administered 2019-01-14: 1 mg via ORAL
  Filled 2019-01-13: qty 1

## 2019-01-13 MED ORDER — GUAIFENESIN-DM 100-10 MG/5ML PO SYRP: 5.0000 mL | ORAL_SOLUTION | ORAL | Status: DC | PRN

## 2019-01-13 NOTE — Consult Note (Addendum)
Pulmonary Consult Note  Date of Service: 01/13/2019  Consulting Physician: Lindi Adie  CC: Coughing and dyspnea  HPI: 53 year old woman with breast cancer treated earlier this year presenting with progressive dyspnea, pleurisy, and dry cough.  Treated with several rounds of abx, steroids with symptoms refractory/progressing.  Pain is bilateral on both ribs, worse with inspiration, sharp, and associated with coughing.  Subsequent imaging has shown a number of findings concerning for metastatic breast cancer (see below).  Pulmonology has been consulted to assist with further workup.  Former light smoker.  Med Hx Breast cancer, (pasted from onc note)  Oncology History  Malignant neoplasm of lower-inner quadrant of right breast of female, estrogen receptor positive ([place])  08/16/2017 Initial Diagnosis    Right lumpectomy: Grade 2 IDC 1.5 cm, with DCIS and necrosis, 0/3 lymph nodes negative, ER 95%, PR 30%, HER-2 negative ratio 1.14, Ki-67 40%, T1CN0 stage Ia; resection of the margin 09/11/2017: Benign    08/16/2017 Oncotype testing    Oncotype DX recurrence score 31: 19% risk of recurrence at 9 years.  High risk    08/16/2017 Cancer Staging    Staging form: Breast, AJCC 8th Edition - Pathologic stage from 08/16/2017: Stage IA (pT1c, pN0, cM0, G2, ER+, PR+, HER2-, Oncotype DX score: 31) - Signed by Gardenia Phlegm, NP on 09/12/2018    09/25/2017 - 02/05/2018 Chemotherapy    Adjuvant chemotherapy with dose dense Adriamycin and Cytoxan x4 followed by Taxol weekly x12      03/11/2018 - 04/25/2018 Radiation Therapy    Adjuvant XRT    05/2018 -  Anti-estrogen oral therapy    Anastrozole daily, plan for 7 years     HLD Hiatal hernia Depression/anxiety HTN   Surg Hx Lumpectomy x 2, axillary node sampling Tonsillectomy  Social Hx Former light smoker Lives with husband  Family Hx Breast cancer in multiple family members  ROS  Positive Symptoms in bold: Constitutional fevers, chills,  weight loss, fatigue, anorexia, malaise  Eyes decreased vision, double vision, eye irritation  Ears, Nose, Mouth, Throat sore throat, trouble swallowing, sinus congestion  Cardiovascular chest pain, paroxysmal nocturnal dyspnea, lower ext edema, palpitations   Respiratory SOB, cough, DOE, hemoptysis, wheezing  Gastrointestinal nausea, vomiting, diarrhea  Genitourinary burning with urination, trouble urinating  Musculoskeletal joint aches, joint swelling, back pain  Integumentary  rashes, skin lesions  Neurological focal weakness, focal numbness, trouble speaking, headaches  Psychiatric depression, anxiety, confusion  Endocrine polyuria, polydipsia, cold intolerance, heat intolerance  Hematologic abnormal bruising, abnormal bleeding, unexplained nose bleeds  Allergic/Immunologic recurrent infections, hives, swollen lymph nodes   Phys Exam  GEN: very anxious woman in NAD HEENT: MMM, no thrush CV: RRR, ext warm PULM: Diminished at bases with crackles, small R effusion with [place], severe pain and coughing fits with deep inspiration GI: soft, +BS EXT: no edema NEURO: moves all 4 ext to command PSYCH: AOx3, very anxious SKIN: No rashes   Studies PET 01/10/19 IMPRESSION: 1. Pulmonary hypermetabolism which is suspicious for lymphangitic tumor spread. More confluent right middle lobe hypermetabolic consolidation, also suspicious for metastatic disease versus less likely infection. 2. Hypermetabolic abdominal, cervical and possible left axillary nodes, suspicious for metastatic disease. 3. Diffuse hepatic hypermetabolism which is nonspecific. More confluent hypermetabolism within the lateral segment left liver lobe, suspicious for metastasis. Correlation with pre and post contrast abdominal MRI should be considered. 4. Diffuse heterogeneous marrow hypermetabolism, which, especially given the appearance of multifocal sclerosis since 10/30/2015, is most consistent with metastatic  disease. 5. Small  bilateral pleural effusions persist. The previously described pericardial effusion has resolved. 6.  Aortic and branch vessel atherosclerosis.  Bone scan neg Echo WNL  Assessment # Acute hypoxemic respiratory failure due to pneumonitis refractory to steroids/abx with suggestion of lymphangitic metastatic cancer # Also uncontrolled pleuritic pain despite home vicodin likely due to above # Hypermetabolic bone marrow nonspecific # Low grade hypermetabolic liver nonspecific # Diffuse low grade PET avid adenopathy # PET-avid RML lesion in bronchovascular distribution # R>L septal thickening and nodularity # R>L bilateral effusions # Pericardial nodularity although subtle # Hx breast cancer as above   Plan - Initially will do R pleural effusion tap and BMBx if onc wishes - If tap +/- BMBx nondiagnostic, proceed with CT super-D protocol for navigational bronch with Dr. Valeta Harms at date TBD to biopsy RML PET avid infiltrate - Appreciate hospitalist admitting patient - AM labs as ordered - Will notify onc to see if needs to be NPO MN - Duonebs - Anxiolytics, pain regimen as ordered (standing toradol, steroids, percocet moderate pain, morphine severe pain) - Will follow with you  Erskine Emery MD PCCM

## 2019-01-13 NOTE — Progress Notes (Signed)

## 2019-01-13 NOTE — ED Triage Notes (Signed)
Pt reports having SOB that has gotten worse over the past month. Thinks her cancer has gotten worse, not currently getting any chemo or radiation treatments, pt having generalized pains everywhere. Having a bone marrow biopsy tomorrow.

## 2019-01-13 NOTE — Telephone Encounter (Signed)
Multiple conversations throughout the day with the patient and with pulmonary (Dr.Icard) Patient continues to have persistent shortness of breath and cough and her husband reports that her symptoms have gotten markedly worse and she is really struggling because of it. After much discussion we determined that the best course of action is for her to get admitted to the hospital.  She will probably need a bronchoscopy.  The most expeditious way to get this done would be through the hospitalization. Because there is significant activity in the bone marrow we will obtain a bone marrow biopsy tomorrow.  If the bone marrow biopsy can show malignancy then possibly we can avoid the bronchoscopy.  However she definitely needs help from pulmonary to help control her symptoms.  If it is lymphangitic spread, it may be impossible to control her symptoms without a systemic treatment. I called [facility] and told to the ER physicians. I also discussed with Dr. Sarajane Jews with hospitalist service regarding her status and the need to hospitalize her.

## 2019-01-13 NOTE — Telephone Encounter (Signed)
-----   Message from Garner Nash, DO sent at 01/13/2019  9:14 AM EDT ----- Regarding: FW: Bronch? [place],  Can you get her in to see me? Maybe today if possible or tomorrow? Thanks Leory Plowman  ----- Message ----- From: Nicholas Lose, MD Sent: 01/10/2019  11:05 AM EDT To: Garner Nash, DO Subject: Bronch?                                        Brad, Can you review her scans and help with getting tissue for diagnosis? She saw Dr.Young before and doesn't want to see him (not sure why) Shes been on 3 rounds of antibiotics and still has persistant cough and SOB. Thanks for your help [employer]

## 2019-01-13 NOTE — Telephone Encounter (Signed)
Call made to patient, she states she was at ED to be admitted. I made her aware to call after D/C for hospital F/U. Voiced understanding. Nothing further needed at this time.

## 2019-01-13 NOTE — Procedures (Signed)
Consent signed Timeout performed US used to mark site of fluid pocket on R Area prepped and draped Anesthesized with 1% buffered lidocaine Using usual seldinger technique, 400cc cloudy fluid aspirated and sent for usual studies No immediate complications CXR pending  Erskine Emery MD

## 2019-01-13 NOTE — ED Notes (Signed)
ED TO INPATIENT HANDOFF REPORT  Name/Age/Gender Jasmine Allen 53 y.o. female  Code Status    Code Status Orders  (From admission, onward)         Start     Ordered   01/13/19 1747  Full code  Continuous     01/13/19 1749        Code Status History    This patient has a current code status but no historical code status.   Advance Care Planning Activity      Home/SNF/Other Home  Chief Complaint cancer pt; shob  Level of Care/Admitting Diagnosis ED Disposition    ED Disposition Condition Comment   Admit  Hospital Area: [facility] [id number]  Level of Care: Telemetry [5]  Admit to tele based on following criteria: Other see comments  Comments: dyspnea  Covid Evaluation: Confirmed COVID Negative  Diagnosis: Dyspnea [id number]  Admitting Physician: Terrilee Croak [id number]  Attending Physician: Terrilee Croak [id number]  Estimated length of stay: past midnight tomorrow  Certification:: I certify this patient will need inpatient services for at least 2 midnights  PT Class (Do Not Modify): Inpatient [101]  PT Acc Code (Do Not Modify): Private [1]       Medical History Past Medical History:  Diagnosis Date  . Alopecia, unspecified   . Anemia, unspecified    during her pregnancy  . Anxiety state, unspecified   . Breast cancer ([place]) 2019   Right Breast Cancer  . Bulging disc   . Depressive disorder, not elsewhere classified   . Diverticulosis of colon (without mention of hemorrhage)    CT Scan  . Dysrhythmia    PVCs in the past  . Esophageal reflux   . Esophagitis 1998  . Hiatal hernia 1998  . History of kidney stones   . Hypercholesteremia   . Hypertension   . Migraine   . Opioid abuse, in remission ([facility])     Allergies Allergies  Allergen Reactions  . Amoxicillin-Pot Clavulanate Other (See Comments)    Big doses cause diarrhea   . Erythromycin Rash  . Metoclopramide Hcl Other (See Comments)    Restless legs  . Sulfonamide  Derivatives Rash    IV Location/Drains/Wounds Patient Lines/Drains/Airways Status   Active Line/Drains/Airways    Name:   Placement date:   Placement time:   Site:   Days:   Implanted Port 09/11/17 Right Chest   09/11/17    1212    Chest   489   Peripheral IV 12/16/18 Left Antecubital   12/16/18    1425    Antecubital   28   Peripheral IV 01/13/19 Left Hand   01/13/19    1724    Hand   less than 1   Incision (Closed) 08/16/17 Breast Right   08/16/17    1004     515   Incision (Closed) 09/11/17 Chest Right   09/11/17    1238     489   Incision (Closed) 09/11/17 Breast Right   09/11/17    1238     489          Labs/Imaging Results for orders placed or performed during the hospital encounter of 01/13/19 (from the past 48 hour(s))  Albumin, pleural or peritoneal fluid     Status: None   Collection Time: 01/13/19  5:17 PM  Result Value Ref Range   Albumin, Fluid 2.1 g/dL    Comment: (NOTE) No normal range established for this test Results should be  evaluated in conjunction with serum values    Fluid Type-FALB PLEURAL     Comment: Performed at [facility], [place] [address], [place], [place] [address]  Lactate dehydrogenase (pleural or peritoneal fluid)     Status: Abnormal   Collection Time: 01/13/19  5:17 PM  Result Value Ref Range   LD, Fluid 204 (H) 3 - 23 U/L    Comment: (NOTE) Results should be evaluated in conjunction with serum values    Fluid Type-FLDH PLEURAL     Comment: Performed at [facility], [place] [address], [place], [place] [address]  Protein, pleural or peritoneal fluid     Status: None   Collection Time: 01/13/19  5:17 PM  Result Value Ref Range   Total protein, fluid 3.0 g/dL    Comment: (NOTE) No normal range established for this test Results should be evaluated in conjunction with serum values    Fluid Type-FTP PLEURAL     Comment: Performed at [facility], [place] [address] [place], [place]  [address]  Body fluid cell count with differential     Status: Abnormal   Collection Time: 01/13/19  5:17 PM  Result Value Ref Range   Fluid Type-FCT PLEURAL    Color, Fluid YELLOW YELLOW   Appearance, Fluid HAZY (A) CLEAR   Total Nucleated Cell Count, Fluid 425 0 - 1,000 cu mm   Neutrophil Count, Fluid 8 0 - 25 %   Lymphs, Fluid 64 %   Monocyte-Macrophage-Serous Fluid 28 (L) 50 - 90 %   Other Cells, Fluid OTHER CELLS IDENTIFIED AS MESOTHELIAL CELLS %    Comment: CORRELATE WITH CYTOLOGY. Performed at [facility], [place] [address] [place], [place] [address]   Comprehensive metabolic panel     Status: Abnormal   Collection Time: 01/13/19  6:42 PM  Result Value Ref Range   Sodium 134 (L) 135 - 145 mmol/L   Potassium 4.0 3.5 - 5.1 mmol/L   Chloride 97 (L) 98 - 111 mmol/L   CO2 28 22 - 32 mmol/L   Glucose, Bld 108 (H) 70 - 99 mg/dL   BUN 17 6 - 20 mg/dL   Creatinine, Ser 0.93 0.44 - 1.00 mg/dL   Calcium 9.0 8.9 - 10.3 mg/dL   Total Protein 6.7 6.5 - 8.1 g/dL   Albumin 3.3 (L) 3.5 - 5.0 g/dL   AST 85 (H) 15 - 41 U/L   ALT 32 0 - 44 U/L   Alkaline Phosphatase 400 (H) 38 - 126 U/L   Total Bilirubin 0.5 0.3 - 1.2 mg/dL   GFR calc non Af Amer >60 >60 mL/min   GFR calc Af Amer >60 >60 mL/min   Anion gap 9 5 - 15    Comment: Performed at [facility], [place] [address] [place],  [address]   Dg Chest 1 View  Result Date: 01/13/2019 CLINICAL DATA:  Post thoracentesis on the right. History of hypertension. EXAM: CHEST  1 VIEW COMPARISON:  Radiographs earlier today.  PET-CT 01/09/2019. FINDINGS: 1720 hours. The right pleural effusion has decreased in volume. There is no evidence of pneumothorax. Diffuse bilateral interstitial pulmonary opacities are stable. There is improved aeration of the right lung base. A small left pleural effusion remains. IMPRESSION: Decreased right pleural effusion and no evidence of pneumothorax following thoracentesis.  Electronically Signed   By: Richardean Sale M.D.   On: 01/13/2019 18:10   Dg Chest 2 View  Result Date: 01/13/2019 CLINICAL  DATA:  Worsening shortness of breath over the last month. Generalized abdominal pain. History of metastatic breast cancer. EXAM: CHEST - 2 VIEW COMPARISON:  PET-CT 01/09/2019, chest CT 01/06/2019 and chest radiographs 12/30/2018 and 12/16/2018. FINDINGS: The heart size and mediastinal contours are stable. There are small bilateral pleural effusions which appear unchanged from the recent CT and PET-CT. Coarse interstitial markings are again noted throughout both lungs with increased focal airspace disease at the right lung base, primarily in the middle lobe. There is a thoracolumbar scoliosis. IMPRESSION: Progressive coarsening of the interstitial markings and right middle lobe airspace disease compared with prior chest radiographs, but without gross change from the most recent PET-CT of 4 days ago. Again, findings may reflect lymphangitic spread of tumor with superimposed inflammation on the right. Electronically Signed   By: Richardean Sale M.D.   On: 01/13/2019 16:27    Pending Labs Unresulted Labs (From admission, onward)    Start     Ordered   01/14/19 0500  Protime-INR  Tomorrow morning,   R     01/13/19 1720   01/14/19 0500  Sedimentation rate  Tomorrow morning,   R     01/13/19 1720   01/14/19 0500  Magnesium  Tomorrow morning,   STAT     01/13/19 1744   01/14/19 0500  Phosphorus  Tomorrow morning,   R     01/13/19 1744   01/14/19 [id number]  Basic metabolic panel  Daily,   R     01/13/19 1749   01/14/19 [id number]  Basic metabolic panel  Daily,   R     01/13/19 1749   01/14/19 0500  CBC WITH DIFFERENTIAL  Daily,   R     01/13/19 1749   01/14/19 0500  TSH  Tomorrow morning,   R     01/13/19 1749   01/13/19 1745  HIV Antibody (routine testing w rflx)  (HIV Antibody (Routine testing w reflex) panel)  Once,   STAT     01/13/19 1749   01/13/19 1745  HIV4GL Save Tube  (HIV  Antibody (Routine testing w reflex) panel)  Once,   STAT     01/13/19 1749   01/13/19 1744  CBC with Differential/Platelet  ONCE - STAT,   STAT     01/13/19 1744   01/13/19 1717  PH, Body Fluid  (Thoracentesis Labs Panel)  Once,   STAT    Comments: Send specimen on ice.    01/13/19 1717   01/13/19 1717  Body fluid culture (includes gram stain)  (Thoracentesis Labs Panel)  Once,   STAT    Question:  Are there also cytology or pathology orders on this specimen?  Answer:  Yes   01/13/19 1717          Vitals/Pain Today's Vitals   01/13/19 1520 01/13/19 1521 01/13/19 1836  BP: 134/85  123/83  Pulse: 81  75  Resp: 19  17  Temp: 98.6 F (37 C)    TempSrc: Oral    SpO2: (!) 88% 96% 96%    Isolation Precautions No active isolations  Medications Medications  ketorolac (TORADOL) 30 MG/ML injection 30 mg (has no administration in time range)  oxyCODONE-acetaminophen (PERCOCET/ROXICET) 5-325 MG per tablet 1 tablet (has no administration in time range)  morphine 4 MG/ML injection 4 mg (has no administration in time range)  ALPRAZolam (XANAX) tablet 0.25 mg (has no administration in time range)  methylPREDNISolone sodium succinate (SOLU-MEDROL) 40 mg/mL injection 40 mg (has no administration in  time range)  ipratropium-albuterol (DUONEB) 0.5-2.5 (3) MG/3ML nebulizer solution 3 mL (has no administration in time range)  guaiFENesin-dextromethorphan (ROBITUSSIN DM) 100-10 MG/5ML syrup 5 mL (has no administration in time range)  sodium chloride flush (NS) 0.9 % injection 3 mL (has no administration in time range)  acetaminophen (TYLENOL) tablet 650 mg (has no administration in time range)    Or  acetaminophen (TYLENOL) suppository 650 mg (has no administration in time range)  senna (SENOKOT) tablet 8.6 mg (has no administration in time range)  magnesium hydroxide (MILK OF MAGNESIA) suspension 30 mL (has no administration in time range)  sorbitol 70 % solution 30 mL (has no administration  in time range)  sodium phosphate (FLEET) 7-19 GM/118ML enema 1 enema (has no administration in time range)  ondansetron (ZOFRAN) tablet 4 mg (has no administration in time range)    Or  ondansetron (ZOFRAN) injection 4 mg (has no administration in time range)  enoxaparin (LOVENOX) injection 40 mg (has no administration in time range)  diazepam (VALIUM) tablet 5 mg (5 mg Oral Given 01/13/19 1645)  fentaNYL (SUBLIMAZE) injection 50 mcg (50 mcg Intravenous Given 01/13/19 1723)    Mobility walks

## 2019-01-13 NOTE — H&P (Signed)
[facility] History and Physical  Jasmine Allen [id number] DOB: [birth year] DOA: 01/13/2019 Referring physician: ED PCP: Aletha Halim., PA-C  Chief Complaint: Persistent intractable cough and shortness of breath, worsening for 3 months ------------------------------------------------------------------------------------------------------ Assessment/Plan: Active Problems:   Pain of anterior chest wall with respiration   Abnormal CT scan, lung   Pleural effusion   Dyspnea  Acute hypoxemic respiratory failure -Presented with dyspnea and exertion and persistent intractable cough, refractory to steroids and antibiotics -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 -Echocardiogram 9/29 with EF 60 to 65%. -10/2, patient was started empirically on Bactrim and valacyclovir.  -Pulmonary consult obtained.  Underwent thoracentesis of right pleural effusion, 400 ml drained.  Fluid analysis sent. -I will start on Hycodan and benzonatate for cough.  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 Lindi Adie involved. -Currently on anastrozole.  Anxiety -Xanax as needed  Hyperlipidemia -Statin  Bilateral pedal edema -Patient states post chemotherapy, she started having bilateral pedal edema requiring Bumex twice daily.  Echocardiogram normal. -Continue Bumex.  GERD -Protonix.  Mobility: Encourage ambulation Diet: Cardiac diet DVT prophylaxis:  Lovenox subcu Code Status:  Full code Family Communication:  Husband at bedside Disposition Plan:  Anticipate home in 2 to 3 days.  ----------------------------------------------------------------------------------------------------- History of Present Illness: Jasmine Allen is a 53 y.o. female with right breast cancer diagnosed in May 2019, status post lumpectomy, chemo, radiation and currently on anastrozole.  She follows up with oncologist Dr. Lindi Adie as an outpatient.  For the last 3 months,  patient is having persistent cough and shortness of breath on exertion.  She follow-up with oncologist and also with cardiologist Dr. Meda Coffee.  Patient reports that she had an echocardiogram done 2 weeks ago and it was reportedly normal. Per oncology note from 10/2, recent chest CT scan showed evidence of carcinomatosis in the lungs and pericardial thickening concerning for metastatic breast cancer.  PET CT scan was performed showing lung infiltrate suspicious for lymphangitic spread versus infection.  Also showed hypermetabolic activity and sclerotic lesions in the bones raising concerns for metastasis to bone and left lobe of liver. She was started presumptively on Bactrim and valacyclovir at that time and was planned for further work-up as an outpatient. Because of her non-remitting symptoms, her oncologist sent her to the hospital as a direct admit.  Because of the bed not being available, patient presented to the ED.  Oncologist also called pulmonary Dr. Tamala Julian who saw the patient in the ED and did a thoracentesis releasing 400 mL of cloudy pleural fluid. Hospital service was called for inpatient admission and coordination of care. In the ED, patient was afebrile, heart rate in 70s, blood pressure normal, oxygen saturation is 96% on room air at rest but it would drop to 80s on minimal exertion.  At the time of my evaluation, patient was propped up in bed.  She had persistent dry cough throughout the conversation.  Husband at bedside and helping with history.  Last set of labs are from a month ago.  I ordered for stat labs today.  Review of Systems:  All systems were reviewed and were negative unless otherwise mentioned in the HPI   Past medical history: Past Medical History:  Diagnosis Date  . Alopecia, unspecified   . Anemia, unspecified    during her pregnancy  . Anxiety state, unspecified   . Breast cancer ([place]) 2019   Right Breast Cancer  . Bulging disc   . Depressive disorder, not  elsewhere  classified   . Diverticulosis of colon (without mention of hemorrhage)    CT Scan  . Dysrhythmia    PVCs in the past  . Esophageal reflux   . Esophagitis 1998  . Hiatal hernia 1998  . History of kidney stones   . Hypercholesteremia   . Hypertension   . Migraine   . Opioid abuse, in remission [facility])     Past surgical history: Past Surgical History:  Procedure Laterality Date  . ABLATION    . BREAST LUMPECTOMY Right 08/16/2017  . BREAST LUMPECTOMY WITH AXILLARY LYMPH NODE BIOPSY Right 08/16/2017   Procedure: RIGHT BREAST LUMPECTOMY WITH AXILLARY SENTINEL LYMPH NODE BIOPSY;  Surgeon: Rolm Bookbinder, MD;  Location: [place];  Service: General;  Laterality: Right;  . CARDIAC CATHETERIZATION  2011   normal coronaries  . KNEE SURGERY    . PORTACATH PLACEMENT Right 09/11/2017   Procedure: INSERTION PORT-A-CATH WITH ULTRASOUND;  Surgeon: Rolm Bookbinder, MD;  Location: [place];  Service: General;  Laterality: Right;  . RE-EXCISION OF BREAST LUMPECTOMY Right 09/11/2017   Procedure: RE-EXCISION OF BREAST LUMPECTOMY;  Surgeon: Rolm Bookbinder, MD;  Location: [place];  Service: General;  Laterality: Right;  . TONSILLECTOMY      Social History:  reports that she quit smoking about 2 years ago. She smoked 0.25 packs per day. She has never used smokeless tobacco. She reports current alcohol use. She reports that she does not use drugs.  Allergies:  Allergies  Allergen Reactions  . Amoxicillin-Pot Clavulanate Other (See Comments)    Big doses cause diarrhea   . Erythromycin Rash  . Metoclopramide Hcl Other (See Comments)    Restless legs  . Sulfonamide Derivatives Rash    Family history:  Family History  Problem Relation Age of Onset  . Heart disease Father        CABG  . Breast cancer Mother   . Breast cancer Maternal Grandmother   . Breast cancer Cousin   . Colon cancer Neg Hx   . Stomach cancer Neg Hx      Home Meds: Prior to  Admission medications   Medication Sig Start Date End Date Taking? Authorizing Provider  albuterol (VENTOLIN HFA) 108 (90 Base) MCG/ACT inhaler Inhale 1 puff into the lungs every 6 (six) hours as needed for wheezing or shortness of breath. 01/06/19  Yes Young, Tarri Fuller D, MD  ALPRAZolam Duanne Moron) 0.5 MG tablet Take 0.5 mg by mouth at bedtime as needed for anxiety or sleep.  10/21/13  Yes [provider]  atorvastatin (LIPITOR) 20 MG tablet Take 20 mg by mouth daily. 11/27/18  Yes [provider]  Biotin 10000 MCG TABS Take 10,000 mcg by mouth daily.    Yes [provider]  bumetanide (BUMEX) 1 MG tablet TAKE 1 TABLET(1 MG) BY MOUTH TWICE DAILY Patient taking differently: Take 1 mg by mouth 2 (two) times daily.  10/31/18  Yes Nicholas Lose, MD  cetirizine (ZYRTEC) 10 MG tablet Take 10 mg by mouth daily.   Yes [provider]  chlorpheniramine-HYDROcodone (TUSSIONEX) 10-8 MG/5ML SUER Take 5 mLs by mouth 2 (two) times daily. 01/07/19  Yes Nicholas Lose, MD  cloNIDine (CATAPRES) 0.3 MG tablet Take 0.3 mg by mouth at bedtime.    Yes [provider]  cyclobenzaprine (FLEXERIL) 10 MG tablet Take 10 mg by mouth every 8 (eight) hours as needed for muscle spasms. 07/16/17  Yes [provider]  HYDROcodone-acetaminophen (NORCO/VICODIN) 5-325 MG tablet Take 1 tablet  by mouth every 12 (twelve) hours as needed. 12/25/18  Yes Nicholas Lose, MD  hydrOXYzine (ATARAX/VISTARIL) 10 MG tablet Take 10-30 mg by mouth at bedtime as needed for itching.  10/07/15  Yes [provider]  hyoscyamine (LEVSIN SL) 0.125 MG SL tablet dissolve 1 tablet under the tongue every 4 hours if needed Patient taking differently: Take 0.125 mg by mouth every 4 (four) hours as needed for cramping.  10/02/16  Yes Nandigam, Venia Minks, MD  ibuprofen (ADVIL) 800 MG tablet Take 1 tablet (800 mg total) by mouth every 6 (six) hours as needed for moderate pain. 11/03/18  Yes Pollina, Gwenyth Allegra, MD   LORazepam (ATIVAN) 0.5 MG tablet Take 1 tablet daily, as needed for nausea. 12/06/18  Yes Nicholas Lose, MD  Multiple Vitamin (MULITIVITAMIN WITH MINERALS) TABS Take 1 tablet by mouth daily.   Yes [provider]  pantoprazole (PROTONIX) 40 MG tablet Take 1 tablet (40 mg total) by mouth 2 (two) times daily before a meal. Patient taking differently: Take 40 mg by mouth daily.  06/28/16  Yes Nandigam, Venia Minks, MD  sulfamethoxazole-trimethoprim (BACTRIM DS) 800-160 MG tablet Take 1 tablet by mouth 2 (two) times daily for 14 days. 01/10/19 01/24/19 Yes Nicholas Lose, MD  traZODone (DESYREL) 100 MG tablet Take 100 mg by mouth at bedtime.  10/17/12  Yes [provider]  valACYclovir (VALTREX) 1000 MG tablet Take 1,000 mg by mouth 2 (two) times daily as needed (for out breaks).    Yes [provider]  methocarbamol (ROBAXIN) 500 MG tablet Take 1 tablet (500 mg total) by mouth every 8 (eight) hours as needed for muscle spasms. Patient not taking: Reported on 01/13/2019 11/03/18   Orpah Greek, MD    Physical Exam: Vitals:   01/13/19 1520 01/13/19 1521  BP: 134/85   Pulse: 81   Resp: 19   Temp: 98.6 F (37 C)   TempSrc: Oral   SpO2: (!) 88% 96%   Wt Readings from Last 3 Encounters:  01/10/19 52.1 kg  01/07/19 52.6 kg  01/06/19 51.3 kg   There is no height or weight on file to calculate BMI.  General exam: Middle-aged Caucasian female, in mild to moderate distress because of persistent cough Skin: No rashes, lesions or ulcers. HEENT: Atraumatic, normocephalic, supple neck, no obvious bleeding Lungs: Clear to auscultation bilaterally CVS: Regular rate and rhythm, no murmur GI/Abd soft, nondistended, nontender, bowel sound present CNS: Alert, awake, oriented x3 Psychiatry: Judgment and insight clear, mood depressed Extremities: No pedal edema, no calf tenderness  Labs on Admission:   CBC: No results for input(s): WBC, NEUTROABS, HGB, HCT, MCV, PLT in  the last 168 hours.  Basic Metabolic Panel: No results for input(s): NA, K, CL, CO2, GLUCOSE, BUN, CREATININE, CALCIUM, MG, PHOS in the last 168 hours.  Liver Function Tests: No results for input(s): AST, ALT, ALKPHOS, BILITOT, PROT, ALBUMIN in the last 168 hours. No results for input(s): LIPASE, AMYLASE in the last 168 hours. No results for input(s): AMMONIA in the last 168 hours.  Cardiac Enzymes: No results for input(s): CKTOTAL, CKMB, CKMBINDEX, TROPONINI in the last 168 hours.  BNP (last 3 results) No results for input(s): BNP in the last 8760 hours.  ProBNP (last 3 results) No results for input(s): PROBNP in the last 8760 hours.  CBG: Recent Labs  Lab 01/09/19 1430  GLUCAP 95    Lipase     Component Value Date/Time   LIPASE 38 11/03/2018 0306  Urinalysis    Component Value Date/Time   COLORURINE YELLOW 11/19/2016 [place] 11/19/2016 1604   LABSPEC 1.018 11/19/2016 1604   PHURINE 6.0 11/19/2016 1604   GLUCOSEU NEGATIVE 11/19/2016 1604   HGBUR NEGATIVE 11/19/2016 [place] 11/19/2016 [place] 11/19/2016 1604   PROTEINUR NEGATIVE 11/19/2016 1604   UROBILINOGEN 0.2 12/28/2013 1030   NITRITE NEGATIVE 11/19/2016 1604   LEUKOCYTESUR NEGATIVE 11/19/2016 1604     Drugs of Abuse     Component Value Date/Time   LABOPIA NONE DETECTED 07/21/2011 2208   COCAINSCRNUR NONE DETECTED 07/21/2011 2208   LABBENZ NONE DETECTED 07/21/2011 2208   AMPHETMU POSITIVE (A) 07/21/2011 2208   THCU NONE DETECTED 07/21/2011 2208   LABBARB POSITIVE (A) 07/21/2011 2208      Radiological Exams on Admission: Dg Chest 1 View  Result Date: 01/13/2019 CLINICAL DATA:  Post thoracentesis on the right. History of hypertension. EXAM: CHEST  1 VIEW COMPARISON:  Radiographs earlier today.  PET-CT 01/09/2019. FINDINGS: 1720 hours. The right pleural effusion has decreased in volume. There is no evidence of pneumothorax. Diffuse bilateral  interstitial pulmonary opacities are stable. There is improved aeration of the right lung base. A small left pleural effusion remains. IMPRESSION: Decreased right pleural effusion and no evidence of pneumothorax following thoracentesis. Electronically Signed   By: Richardean Sale M.D.   On: 01/13/2019 18:10   Dg Chest 2 View  Result Date: 01/13/2019 CLINICAL DATA:  Worsening shortness of breath over the last month. Generalized abdominal pain. History of metastatic breast cancer. EXAM: CHEST - 2 VIEW COMPARISON:  PET-CT 01/09/2019, chest CT 01/06/2019 and chest radiographs 12/30/2018 and 12/16/2018. FINDINGS: The heart size and mediastinal contours are stable. There are small bilateral pleural effusions which appear unchanged from the recent CT and PET-CT. Coarse interstitial markings are again noted throughout both lungs with increased focal airspace disease at the right lung base, primarily in the middle lobe. There is a thoracolumbar scoliosis. IMPRESSION: Progressive coarsening of the interstitial markings and right middle lobe airspace disease compared with prior chest radiographs, but without gross change from the most recent PET-CT of 4 days ago. Again, findings may reflect lymphangitic spread of tumor with superimposed inflammation on the right. Electronically Signed   By: Richardean Sale M.D.   On: 01/13/2019 16:27   ----------------------------------------------------------------------------------------------------------------------------------------------------------- Severity of Illness: The appropriate patient status for this patient is INPATIENT. Inpatient status is judged to be reasonable and necessary in order to provide the required intensity of service to ensure the patient's safety. The patient's presenting symptoms, physical exam findings, and initial radiographic and laboratory data in the context of their chronic comorbidities is felt to place them at high risk for further clinical  deterioration. Furthermore, it is not anticipated that the patient will be medically stable for discharge from the hospital within 2 midnights of admission. The following factors support the patient status of inpatient.   " The patient's presenting symptoms include persistent cough and dyspnea on exertion for 3 months. " The worrisome physical exam findings include persistent cough, hypoxia. " The initial radiographic and laboratory data are worrisome because of carcinomatosis. " The chronic co-morbidities include breast cancer with metastasis to lungs.   * I certify that at the point of admission it is my clinical judgment that the patient will require inpatient hospital care spanning beyond 2 midnights from the point of admission due to high intensity of service, high risk for further deterioration and high frequency  of surveillance required.*   Signed, Terrilee Croak, MD [facility] 01/13/2019

## 2019-01-13 NOTE — Progress Notes (Signed)
Spoke with onc on call, no need to be NPO for bone marrow biopsy.  Erskine Emery MD

## 2019-01-13 NOTE — ED Notes (Signed)
Lab called for room 11's labwork

## 2019-01-14 DIAGNOSIS — R0600 Dyspnea, unspecified: Secondary | ICD-10-CM

## 2019-01-14 LAB — CBC WITH DIFFERENTIAL/PLATELET
Abs Immature Granulocytes: 0.17 10*3/uL — ABNORMAL HIGH (ref 0.00–0.07)
Basophils Absolute: 0 10*3/uL (ref 0.0–0.1)
Basophils Relative: 1 %
Eosinophils Absolute: 0 10*3/uL (ref 0.0–0.5)
Eosinophils Relative: 1 %
HCT: 34.4 % — ABNORMAL LOW (ref 36.0–46.0)
Hemoglobin: 11.5 g/dL — ABNORMAL LOW (ref 12.0–15.0)
Immature Granulocytes: 5 %
Lymphocytes Relative: 38 %
Lymphs Abs: 1.4 10*3/uL (ref 0.7–4.0)
MCH: 28.8 pg (ref 26.0–34.0)
MCHC: 33.4 g/dL (ref 30.0–36.0)
MCV: 86 fL (ref 80.0–100.0)
Monocytes Absolute: 0.3 10*3/uL (ref 0.1–1.0)
Monocytes Relative: 8 %
Neutro Abs: 1.7 10*3/uL (ref 1.7–7.7)
Neutrophils Relative %: 47 %
Platelets: 88 10*3/uL — ABNORMAL LOW (ref 150–400)
RBC: 4 MIL/uL (ref 3.87–5.11)
RDW: 13.2 % (ref 11.5–15.5)
WBC: 3.5 10*3/uL — ABNORMAL LOW (ref 4.0–10.5)
nRBC: 0.6 % — ABNORMAL HIGH (ref 0.0–0.2)

## 2019-01-14 LAB — HIV ANTIBODY (ROUTINE TESTING W REFLEX): HIV Screen 4th Generation wRfx: NONREACTIVE

## 2019-01-14 LAB — MAGNESIUM: Magnesium: 2.2 mg/dL (ref 1.7–2.4)

## 2019-01-14 LAB — PROTIME-INR
INR: 1.1 (ref 0.8–1.2)
Prothrombin Time: 13.6 seconds (ref 11.4–15.2)

## 2019-01-14 LAB — TSH: TSH: 4.161 u[IU]/mL (ref 0.350–4.500)

## 2019-01-14 LAB — SEDIMENTATION RATE: Sed Rate: 62 mm/hr — ABNORMAL HIGH (ref 0–22)

## 2019-01-14 LAB — PHOSPHORUS: Phosphorus: 4.4 mg/dL (ref 2.5–4.6)

## 2019-01-14 MED ORDER — LIDOCAINE-EPINEPHRINE 2 %-1:50000 IJ SOLN: 1.7000 mL | Freq: Once | INTRAMUSCULAR | Status: DC

## 2019-01-14 MED ORDER — PREDNISONE 20 MG PO TABS
20.0000 mg | ORAL_TABLET | Freq: Every day | ORAL | Status: DC
  Administered 2019-01-14 – 2019-01-16 (×3): 20 mg via ORAL
  Filled 2019-01-14 (×3): qty 1

## 2019-01-14 MED ORDER — LIDOCAINE HCL (PF) 2 % IJ SOLN
10.0000 mL | Freq: Once | INTRAMUSCULAR | Status: AC
  Administered 2019-01-14: 10 mL via INTRADERMAL
  Filled 2019-01-14: qty 10

## 2019-01-14 MED ORDER — TRAZODONE HCL 50 MG PO TABS
150.0000 mg | ORAL_TABLET | Freq: Every evening | ORAL | Status: DC | PRN
  Administered 2019-01-14 – 2019-01-15 (×2): 150 mg via ORAL
  Filled 2019-01-14 (×2): qty 1

## 2019-01-14 MED ORDER — LORAZEPAM 2 MG/ML IJ SOLN
INTRAMUSCULAR | Status: AC
  Filled 2019-01-14: qty 1

## 2019-01-14 MED ORDER — LORAZEPAM 2 MG/ML IJ SOLN: 1.0000 mg | Freq: Once | INTRAMUSCULAR | Status: DC

## 2019-01-14 MED ORDER — IBUPROFEN 200 MG PO TABS
600.0000 mg | ORAL_TABLET | Freq: Three times a day (TID) | ORAL | Status: DC
  Administered 2019-01-14 – 2019-01-16 (×7): 600 mg via ORAL
  Filled 2019-01-14 (×7): qty 3

## 2019-01-14 MED ORDER — IPRATROPIUM-ALBUTEROL 0.5-2.5 (3) MG/3ML IN SOLN: 3.0000 mL | Freq: Two times a day (BID) | RESPIRATORY_TRACT | Status: DC

## 2019-01-14 MED ORDER — ALBUTEROL SULFATE (2.5 MG/3ML) 0.083% IN NEBU
3.0000 mL | INHALATION_SOLUTION | RESPIRATORY_TRACT | Status: DC | PRN
  Administered 2019-01-14: 3 mL via RESPIRATORY_TRACT
  Filled 2019-01-14: qty 3

## 2019-01-14 NOTE — Progress Notes (Addendum)
Pulmonary Followup Note  S: No events overnight.   Breathing a bit better but still coughing and desaturating with exertion.  O: afebrile, besides sats vitals benign In NAD Lungs a bit more clear today Less anxious I see no pedal edema  Mild pancytopenia on labs Markedly elevated alk phos  A: # New hypoxemia related to either lymphangitic spread of cancer vs. less likely inflammatory # Uncontrolled pleuritic vs. rib/bone marrow pain improved # Variety of imaging and lab findings concerning for metastatic cancer including bone marrow spread # Pleural effusion s/p thoracentesis # History of recently treated breast cancer # Anxiety/depression  - Switch toradol to motrin - Switch nebs to albuterol MDI (notices no difference with nebs) - I really don't think she needs bumex - Cough suppressing agents per primary - PRN benzos and opiates as ordered - Bone marrow biopsy today by oncology - Walking pulse oximetry prior to DC, she will likely need 2L with activity and at night - Hopefully home later today, we can arrange nav bronch PRN depending on path results from bone marrow and pleural fluid - Patient can f/u in our clinic in 2 weeks to check on breathing and cough - Call me if any questions/concerns  Erskine Emery MD PCCM

## 2019-01-14 NOTE — Progress Notes (Signed)
I was present for entire procedure for ACLS support.  Procedure went smoothly and patients vitals remained stable throughout.  Handed off follow up care to patients nurse.

## 2019-01-14 NOTE — Procedures (Signed)
INDICATION: refractory anemia, pancytopenia, metastatic breast cancer  Bone Marrow Biopsy and Aspiration Procedure Note   Informed consent was obtained and potential risks including bleeding, infection and pain were reviewed with the patient.  The patient's name, date of birth, identification, consent and allergies were verified prior to the start of procedure and time out was performed.  The left posterior iliac crest was chosen as the site of biopsy.  The skin was prepped with ChloraPrep.   10 cc of 2% lidocaine was used to provide local anaesthesia.   Unable to obtain aspirate.  2cm core biopsy obtained.  Pressure was applied to the biopsy site and bandage was placed over the biopsy site. Patient was made to lie on the back for 30 mins.  Recommended repeat VS at 30 minute point with bandage check.  To call me at [phone] for any questions.  The procedure was tolerated well. COMPLICATIONS: None BLOOD LOSS: none   Specimens sent for flow cytometry, cytogenetics and additional studies.  Signed Scot Dock, NP

## 2019-01-14 NOTE — Progress Notes (Signed)
PROGRESS NOTE  Jasmine Allen  DOB: September 11, 1965  PCP: Aletha Halim., PA-C DGU:[id number]  DOA: 01/13/2019  LOS: 1 day   Brief narrative: Jasmine Allen is a 53 y.o. female with right breast cancer diagnosed in May 2019, status post lumpectomy, chemo, radiation and currently on anastrozole.  She follows up with oncologist Dr. Lindi Adie as an outpatient.  For the last 3 months, patient is having persistent cough and shortness of breath on exertion.  Patient had normal echocardiogram 2 weeks prior to presentation.   Per oncology note from 10/2, recent chest CT scan showed evidence of carcinomatosis in the lungs and pericardial thickening concerning for metastatic breast cancer.  PET CT scan was performed showing lung infiltrate suspicious for lymphangitic spread versus infection.  Also showed hypermetabolic activity and sclerotic lesions in the bones raising concerns for metastasis to bone and left lobe of liver. She was started presumptively on Bactrim and valacyclovir at that time and was planned for further work-up as an outpatient. Because of her non-remitting symptoms, her oncologist sent her to the hospital as a direct admit on 10/5. Pulmonary Dr. Tamala Julian did a thoracentesis on the right releasing 400 mL of cloudy pleural fluid. Patient was admitted under hospitalist service for further vascular management.  Subjective: Patient was seen and examined this morning.  Middle-aged Caucasian female.  Sitting up in bed.  Not in distress.  Continues to have cough which is slightly better than yesterday.  Continues to have dyspnea on minimal exertion.  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 -Echocardiogram 9/29 with EF 60 to 65%. -10/2, patient was started empirically on Bactrim and valacyclovir.  -Pulmonary consult  appreciated..  Underwent thoracentesis of right pleural effusion, 400 ml drained.  Fluid analysis sent. -Continue Hycodan and benzonatate for cough.  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 Lindi Adie involved. -Currently on anastrozole. -Underwent bone marrow biopsy per oncology today.  Anxiety -Xanax as needed  Hyperlipidemia -Statin  Bilateral pedal edema -Patient states post chemotherapy, she started having bilateral pedal edema requiring Bumex twice daily.  Echocardiogram normal. -Continue Bumex.  GERD -Protonix.  Mobility: Encourage ambulation Diet: Cardiac diet DVT prophylaxis: Lovenox subcu Code Status: Full code Family Communication: Husband was not at bedside today. Disposition Plan:  And to be discharged home tomorrow.  Encouraged to ambulate on the hallway today with oxygen supplementation.  Consultants:  Oncology, pulmonary  Procedures:  Bone marrow biopsy, 10/6  Antimicrobials: Anti-infectives (From admission, onward)   Start     Dose/Rate Route Frequency Ordered Stop   01/13/19 2200  sulfamethoxazole-trimethoprim (BACTRIM DS) 800-160 MG per tablet 1 tablet     1 tablet Oral 2 times daily 01/13/19 2006     01/13/19 2006  valACYclovir (VALTREX) tablet 1,000 mg     1,000 mg Oral 2 times daily PRN 01/13/19 2006        Diet Order            Diet regular Room service appropriate? Yes; Fluid consistency: Thin  Diet effective now              Infusions:    Scheduled Meds: . atorvastatin  20 mg Oral q1800  . benzonatate  100 mg Oral TID  . chlorpheniramine-HYDROcodone  5 mL Oral BID  . cloNIDine  0.3 mg Oral QHS  . enoxaparin (LOVENOX) injection  40 mg Subcutaneous  Q24H  . ibuprofen  600 mg Oral TID WC  . loratadine  10 mg Oral Daily  . LORazepam  1-2 mg Intravenous Once  . pantoprazole  40 mg Oral Daily  . predniSONE  20 mg Oral Q breakfast  . senna  1 tablet Oral BID  . sodium chloride flush  3 mL  Intravenous Q12H  . sulfamethoxazole-trimethoprim  1 tablet Oral BID    PRN meds: acetaminophen **OR** acetaminophen, albuterol, ALPRAZolam, cyclobenzaprine, guaiFENesin-dextromethorphan, HYDROcodone-acetaminophen, HYDROcodone-homatropine, hydrOXYzine, magnesium hydroxide, morphine injection, ondansetron **OR** ondansetron (ZOFRAN) IV, oxyCODONE-acetaminophen, sodium phosphate, sorbitol, traZODone, valACYclovir   Objective: Vitals:   01/14/19 1253 01/14/19 1335  BP: (!) 88/73   Pulse: 86   Resp: 16   Temp: 98.5 F (36.9 C)   SpO2: 95% 93%    Intake/Output Summary (Last 24 hours) at 01/14/2019 1358 Last data filed at 01/14/2019 0942 Gross per 24 hour  Intake 120 ml  Output -  Net 120 ml   Filed Weights   01/13/19 2015  Weight: 51.1 kg   Weight change:  Body mass index is 22 kg/m.   Physical Exam: General exam: Appears calm and comfortable.  Propped up in bed.  Not in distress Skin: No rashes, lesions or ulcers. HEENT: Atraumatic, normocephalic, supple neck, no obvious bleeding Lungs: Clear to auscultation bilaterally CVS: Regular rate and rhythm, no murmur GI/Abd soft, nontender, nondistended, bowel sound present CNS: Alert, awake, oriented x3 Psychiatry: Mood appropriate, judgment and insight clear Extremities: No pedal edema at this time, no calf tenderness  Data Review: I have personally reviewed the laboratory data and studies available.  Recent Labs  Lab 01/13/19 1842 01/14/19 0518  WBC 4.0 3.5*  NEUTROABS 2.5 1.7  HGB 12.9 11.5*  HCT 37.7 34.4*  MCV 85.1 86.0  PLT 95* 88*   Recent Labs  Lab 01/13/19 1842 01/14/19 0518  NA 134*  --   K 4.0  --   CL 97*  --   CO2 28  --   GLUCOSE 108*  --   BUN 17  --   CREATININE 0.93  --   CALCIUM 9.0  --   MG  --  2.2  PHOS  --  4.4    Terrilee Croak, MD  [facility] 01/14/2019

## 2019-01-14 NOTE — Progress Notes (Signed)
Walking oxygen sat 86% on room air.

## 2019-01-15 ENCOUNTER — Other Ambulatory Visit: Payer: Self-pay | Admitting: Hematology and Oncology

## 2019-01-15 ENCOUNTER — Inpatient Hospital Stay: Payer: 59

## 2019-01-15 ENCOUNTER — Encounter (HOSPITAL_COMMUNITY): Payer: 59

## 2019-01-15 ENCOUNTER — Encounter: Payer: Self-pay | Admitting: *Deleted

## 2019-01-15 DIAGNOSIS — Z170 Estrogen receptor positive status [ER+]: Secondary | ICD-10-CM

## 2019-01-15 DIAGNOSIS — J910 Malignant pleural effusion: Secondary | ICD-10-CM

## 2019-01-15 DIAGNOSIS — Z923 Personal history of irradiation: Secondary | ICD-10-CM

## 2019-01-15 DIAGNOSIS — C50311 Malignant neoplasm of lower-inner quadrant of right female breast: Secondary | ICD-10-CM

## 2019-01-15 LAB — CBC WITH DIFFERENTIAL/PLATELET
Abs Immature Granulocytes: 0.16 10*3/uL — ABNORMAL HIGH (ref 0.00–0.07)
Basophils Absolute: 0 10*3/uL (ref 0.0–0.1)
Basophils Relative: 0 %
Eosinophils Absolute: 0 10*3/uL (ref 0.0–0.5)
Eosinophils Relative: 0 %
HCT: 31.2 % — ABNORMAL LOW (ref 36.0–46.0)
Hemoglobin: 10.7 g/dL — ABNORMAL LOW (ref 12.0–15.0)
Immature Granulocytes: 4 %
Lymphocytes Relative: 23 %
Lymphs Abs: 1 10*3/uL (ref 0.7–4.0)
MCH: 29.1 pg (ref 26.0–34.0)
MCHC: 34.3 g/dL (ref 30.0–36.0)
MCV: 84.8 fL (ref 80.0–100.0)
Monocytes Absolute: 0.3 10*3/uL (ref 0.1–1.0)
Monocytes Relative: 7 %
Neutro Abs: 2.9 10*3/uL (ref 1.7–7.7)
Neutrophils Relative %: 66 %
Platelets: 82 10*3/uL — ABNORMAL LOW (ref 150–400)
RBC: 3.68 MIL/uL — ABNORMAL LOW (ref 3.87–5.11)
RDW: 13 % (ref 11.5–15.5)
WBC: 4.4 10*3/uL (ref 4.0–10.5)
nRBC: 0.9 % — ABNORMAL HIGH (ref 0.0–0.2)

## 2019-01-15 LAB — BASIC METABOLIC PANEL
Anion gap: 10 (ref 5–15)
BUN: 20 mg/dL (ref 6–20)
CO2: 26 mmol/L (ref 22–32)
Calcium: 8.4 mg/dL — ABNORMAL LOW (ref 8.9–10.3)
Chloride: 96 mmol/L — ABNORMAL LOW (ref 98–111)
Creatinine, Ser: 0.97 mg/dL (ref 0.44–1.00)
GFR calc Af Amer: >60 mL/min (ref >60)
GFR calc non Af Amer: >60 mL/min (ref >60)
Glucose, Bld: 100 mg/dL — ABNORMAL HIGH (ref 70–99)
Potassium: 3.5 mmol/L (ref 3.5–5.1)
Sodium: 132 mmol/L — ABNORMAL LOW (ref 135–145)

## 2019-01-15 LAB — SURGICAL PATHOLOGY

## 2019-01-15 LAB — PH, BODY FLUID: pH, Body Fluid: 7.7

## 2019-01-15 MED ORDER — LORAZEPAM 2 MG/ML IJ SOLN
0.5000 mg | Freq: Once | INTRAMUSCULAR | Status: AC
  Administered 2019-01-15: 0.5 mg via INTRAVENOUS
  Filled 2019-01-15: qty 1

## 2019-01-15 MED ORDER — POLYETHYLENE GLYCOL 3350 17 G PO PACK: 17.0000 g | PACK | Freq: Every day | ORAL | Status: DC | PRN

## 2019-01-15 MED ORDER — LETROZOLE 2.5 MG PO TABS: 2.5000 mg | ORAL_TABLET | Freq: Every day | ORAL | 3 refills | Status: DC

## 2019-01-15 MED ORDER — LETROZOLE 2.5 MG PO TABS
2.5000 mg | ORAL_TABLET | Freq: Every day | ORAL | Status: DC
  Administered 2019-01-15 – 2019-01-16 (×2): 2.5 mg via ORAL
  Filled 2019-01-15 (×2): qty 1

## 2019-01-15 NOTE — Progress Notes (Signed)
Per Dr. Geralyn Flash request [facility] Tumor Profiling Requisition filled out and faxed to [phone]

## 2019-01-15 NOTE — Progress Notes (Signed)
HEMATOLOGY-ONCOLOGY PROGRESS NOTE  SUBJECTIVE: Patient is feeling slightly better with mild improvement in the cough.  She is still short of breath to ambulation. Patient's husband was in the room and I discussed the pathology report. The pleural effusion and the bone marrow biopsy both are positive for metastatic breast cancer.  Breast prognostic panel and molecular testing have been ordered and they are pending.   Oncology History  Malignant neoplasm of lower-inner quadrant of right breast of female, estrogen receptor positive ([place])  08/16/2017 Initial Diagnosis   Right lumpectomy: 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  High risk   08/16/2017 Cancer Staging   Staging form: Breast, AJCC 8th Edition - Pathologic stage from 08/16/2017: Stage IA (pT1c, pN0, cM0, G2, ER+, PR+, HER2-, Oncotype DX score: 31) - Signed by Gardenia Phlegm, NP on 09/12/2018   09/25/2017 - 02/05/2018 Chemotherapy   Adjuvant chemotherapy with dose dense Adriamycin and Cytoxan x4 followed by Taxol weekly x12    03/11/2018 - 04/25/2018 Radiation Therapy   Adjuvant XRT   05/2018 -  Anti-estrogen oral therapy   Anastrozole daily, plan for 7 years     OBJECTIVE: REVIEW OF SYSTEMS:   Constitutional: Denies fevers, chills or abnormal weight loss Eyes: Denies blurriness of vision Ears, nose, mouth, throat, and face: Denies mucositis or sore throat Respiratory: Cough and shortness of breath Cardiovascular: Denies palpitation, chest discomfort Gastrointestinal:  Denies nausea, heartburn or change in bowel habits Skin: Denies abnormal skin rashes Lymphatics: Denies new lymphadenopathy or easy bruising Neurological:Denies numbness, tingling or new weaknesses Behavioral/Psych: Mood  is stable, no new changes  Extremities: No lower extremity edema  All other systems were reviewed with the patient and are negative.  I have reviewed the past medical history, past surgical history, social history and family history with the patient and they are unchanged from previous note.   PHYSICAL EXAMINATION: ECOG PERFORMANCE STATUS: 3 - Symptomatic, >50% confined to bed  Vitals:   01/15/19 0509 01/15/19 1401  BP: (!) 94/52 119/72  Pulse: 75 74  Resp: 16 16  Temp: 98.5 F (36.9 C) 98.4 F (36.9 C)  SpO2: 92% 93%   Filed Weights   01/13/19 2015  Weight: 112 lb 10.5 oz (51.1 kg)    GENERAL:alert, no distress and comfortable SKIN: skin color, texture, turgor are normal, no rashes or significant lesions EYES: normal, Conjunctiva are pink and non-injected, sclera clear OROPHARYNX:no exudate, no erythema and lips, buccal mucosa, and tongue normal  NECK: supple, thyroid normal size, non-tender, without nodularity LYMPH:  no palpable lymphadenopathy in the cervical, axillary or inguinal LUNGS: clear to auscultation and percussion with normal breathing effort HEART: regular rate & rhythm and no murmurs and no lower extremity edema ABDOMEN:abdomen soft, non-tender and normal bowel sounds Musculoskeletal:no cyanosis of digits and no clubbing  NEURO: alert & oriented x 3 with fluent speech, no focal motor/sensory deficits  LABORATORY DATA:  I have reviewed the data as listed CMP Latest Ref Rng & Units 01/15/2019 01/13/2019 12/16/2018  Glucose 70 - 99 mg/dL 100(H) 108(H) 83  BUN 6 - 20 mg/dL 20 17 20   Creatinine 0.44 - 1.00 mg/dL 0.97 0.93 0.77  Sodium 135 - 145 mmol/L 132(L) 134(L) 141  Potassium 3.5 - 5.1 mmol/L  3.5 4.0 4.1  Chloride 98 - 111 mmol/L 96(L) 97(L) 104  CO2 22 - 32 mmol/L 26 28 25   Calcium 8.9 - 10.3 mg/dL 8.4(L) 9.0 9.2  Total Protein 6.5 - 8.1 g/dL - 6.7 -  Total Bilirubin 0.3 - 1.2 mg/dL - 0.5 -  Alkaline Phos 38 - 126 U/L - 400(H) -  AST 15 - 41 U/L - 85(H)  -  ALT 0 - 44 U/L - 32 -    Lab Results  Component Value Date   WBC 4.4 01/15/2019   HGB 10.7 (L) 01/15/2019   HCT 31.2 (L) 01/15/2019   MCV 84.8 01/15/2019   PLT 82 (L) 01/15/2019   NEUTROABS 2.9 01/15/2019    ASSESSMENT AND PLAN: 1.  Metastatic breast cancer: Pleural fluid as well as the bone marrow biopsy are both positive for malignant cells consistent with metastatic breast cancer.  Breast prognostic panel and [facility] molecular testing have been ordered on the pleural fluid. I suspect that this will be estrogen receptor positive disease.  We had a discussion about why this happened.  I reviewed her chart and because she refused to take antiestrogen therapy, I feel that may be the cause of her metastatic disease.  In any case if it is ER PR positive then I would recommend systemic treatment with Ibrance with letrozole.  We will start letrozole today.  Ibrance: I discussed the risks and benefits of Ibrance including myelosuppression especially neutropenia and with that risk of infection, there is risk of pulmonary embolism and mild peripheral neuropathy as well. Fatigue, nausea, diarrhea, decreased appetite as well as alopecia and thrombocytopenia are also potential side effects of Ibrance  2.  Shortness of breath minimal exertion: Cough: Lymphangitic spread, we will watch and monitor this.  She will benefit from home oxygen in the interim until we get a response from the treatment.  I will start her on letrozole and send it to her pharmacy.  Once we have ER PR results and I will send for Ibrance.  We will see her in a week to go over her symptoms and toxicities.  She was asking if she can go to beach trip with her family.  I instructed her that she can go for the trip and the whole purpose of our treatment is palliation and quality of life. I suspect the patient will be discharged home tomorrow.

## 2019-01-15 NOTE — Progress Notes (Signed)
PROGRESS NOTE  [facility]  DOB: 10/27/1965  PCP: Aletha Halim., PA-C QMV:[id number]  DOA: 01/13/2019  LOS: 2 days   Brief narrative: Jasmine Allen is a 53 y.o. female with right breast cancer diagnosed in May 2019, status post lumpectomy, chemo, radiation and currently on anastrozole.  She follows up with oncologist Dr. Lindi Adie as an outpatient.  For the last 3 months, patient is having persistent cough and shortness of breath on exertion.  Patient had normal echocardiogram 2 weeks prior to presentation.   Per oncology note from 10/2, recent chest CT scan showed evidence of carcinomatosis in the lungs and pericardial thickening concerning for metastatic breast cancer.  PET CT scan was performed showing lung infiltrate suspicious for lymphangitic spread versus infection.  Also showed hypermetabolic activity and sclerotic lesions in the bones raising concerns for metastasis to bone and left lobe of liver. She was started presumptively on Bactrim and valacyclovir at that time and was planned for further work-up as an outpatient. Because of her non-remitting symptoms, her oncologist sent her to the hospital as a direct admit on 10/5. Pulmonary Dr. Tamala Julian did a thoracentesis on the right releasing 400 mL of cloudy pleural fluid. Patient was admitted under hospitalist service for further vascular management.  Subjective: Patient was seen and examined this morning.  Middle-aged Caucasian female.  Sitting up in bed.  Not in distress.  Continues to have cough which is slightly better than yesterday.  Continues to have dyspnea on minimal exertion. 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  Assessment/Plan: Acute hypoxemic respiratory failure -Presented with dyspnea and exertion and persistent intractable cough, refractory to steroids and antibiotics -Echocardiogram 9/29 with EF 60 to 65%. -Underwent thoracentesis of right pleural effusion, 400 ml drained on  10/5. -Pleural fluid cytology showed adenocarcinoma. Treatment plan per oncology and pulmonary. -Pulmonary consult appreciated. Fluid analysis sent. -Continue Hycodan and benzonatate for cough.  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 Lindi Adie involved. -Currently on anastrozole. -Underwent bone marrow biopsy yesterday 10/6.  Anxiety - Xanax as needed  Hyperlipidemia - statin  Bilateral pedal edema -Patient states post chemotherapy, she started having bilateral pedal edema requiring Bumex twice daily.  Echocardiogram normal. -Patient does not have pedal edema at this time and her blood pressure is running low.  Bumex is stopped.  GERD -Protonix.  Mobility: Encourage ambulation Diet: Cardiac diet DVT prophylaxis: Lovenox subcu Code Status: Full code Family Communication: Husband was not at bedside today. Disposition Plan:  And to be discharged home tomorrow.  Encouraged to ambulate on the hallway today with oxygen supplementation.  Consultants:  Oncology, pulmonary  Procedures:  Bone marrow biopsy, 10/6  Antimicrobials: Anti-infectives (From admission, onward)   Start     Dose/Rate Route Frequency Ordered Stop   01/13/19 2200  sulfamethoxazole-trimethoprim (BACTRIM DS) 800-160 MG per tablet 1 tablet     1 tablet Oral 2 times daily 01/13/19 2006     01/13/19 2006  valACYclovir (VALTREX) tablet 1,000 mg     1,000 mg Oral 2 times daily PRN 01/13/19 2006        Diet Order            Diet regular Room service appropriate? Yes; Fluid consistency: Thin  Diet effective now              Infusions:    Scheduled Meds: . atorvastatin  20 mg Oral q1800  . benzonatate  100 mg Oral TID  .  chlorpheniramine-HYDROcodone  5 mL Oral BID  . cloNIDine  0.3 mg Oral QHS  . enoxaparin (LOVENOX) injection  40 mg Subcutaneous Q24H  . ibuprofen  600 mg Oral TID WC  . loratadine  10 mg Oral Daily  . LORazepam  1-2 mg Intravenous Once   . pantoprazole  40 mg Oral Daily  . predniSONE  20 mg Oral Q breakfast  . senna  1 tablet Oral BID  . sodium chloride flush  3 mL Intravenous Q12H  . sulfamethoxazole-trimethoprim  1 tablet Oral BID    PRN meds: acetaminophen **OR** acetaminophen, albuterol, ALPRAZolam, cyclobenzaprine, guaiFENesin-dextromethorphan, HYDROcodone-acetaminophen, HYDROcodone-homatropine, hydrOXYzine, magnesium hydroxide, morphine injection, ondansetron **OR** ondansetron (ZOFRAN) IV, oxyCODONE-acetaminophen, sodium phosphate, sorbitol, traZODone, valACYclovir   Objective: Vitals:   01/14/19 2025 01/15/19 0509  BP: 105/72 (!) 94/52  Pulse: 89 75  Resp: 20 16  Temp: 99.1 F (37.3 C) 98.5 F (36.9 C)  SpO2: (!) 89% 92%   No intake or output data in the 24 hours ending 01/15/19 1335 Filed Weights   01/13/19 2015  Weight: 51.1 kg   Weight change:  Body mass index is 22 kg/m.   Physical Exam: General exam: Appears calm and comfortable.  Propped up in bed.  Not in distress Skin: No rashes, lesions or ulcers. HEENT: Atraumatic, normocephalic, supple neck, no obvious bleeding Lungs: Clear to auscultation bilaterally CVS: Regular rate and rhythm, no murmur GI/Abd soft, nontender, nondistended, bowel sound present CNS: Alert, awake, oriented x3 Psychiatry: Mood appropriate, judgment and insight clear Extremities: No pedal edema at this time, no calf tenderness  Data Review: I have personally reviewed the laboratory data and studies available.  Recent Labs  Lab 01/13/19 1842 01/14/19 0518 01/15/19 0447  WBC 4.0 3.5* 4.4  NEUTROABS 2.5 1.7 2.9  HGB 12.9 11.5* 10.7*  HCT 37.7 34.4* 31.2*  MCV 85.1 86.0 84.8  PLT 95* 88* 82*   Recent Labs  Lab 01/13/19 1842 01/14/19 0518 01/15/19 0447  NA 134*  --  132*  K 4.0  --  3.5  CL 97*  --  96*  CO2 28  --  26  GLUCOSE 108*  --  100*  BUN 17  --  20  CREATININE 0.93  --  0.97  CALCIUM 9.0  --  8.4*  MG  --  2.2  --   PHOS  --  4.4  --      Terrilee Croak, MD  [facility] 01/15/2019

## 2019-01-16 ENCOUNTER — Ambulatory Visit: Payer: 59 | Admitting: Hematology and Oncology

## 2019-01-16 DIAGNOSIS — C7800 Secondary malignant neoplasm of unspecified lung: Secondary | ICD-10-CM

## 2019-01-16 LAB — CYTOLOGY - NON PAP

## 2019-01-16 MED ORDER — PREDNISONE 20 MG PO TABS: 20.0000 mg | ORAL_TABLET | Freq: Every day | ORAL | 0 refills | Status: AC

## 2019-01-16 MED ORDER — ALBUTEROL SULFATE (2.5 MG/3ML) 0.083% IN NEBU: 3.0000 mL | INHALATION_SOLUTION | Freq: Four times a day (QID) | RESPIRATORY_TRACT | 0 refills | Status: DC | PRN

## 2019-01-16 MED ORDER — BENZONATATE 100 MG PO CAPS: 100.0000 mg | ORAL_CAPSULE | Freq: Three times a day (TID) | ORAL | 0 refills | Status: AC

## 2019-01-16 MED ORDER — HYDROCOD POLST-CPM POLST ER 10-8 MG/5ML PO SUER: 5.0000 mL | Freq: Two times a day (BID) | ORAL | 0 refills | Status: DC

## 2019-01-16 MED ORDER — HYDROCOD POLST-CPM POLST ER 10-8 MG/5ML PO SUER: 5.0000 mL | Freq: Four times a day (QID) | ORAL | 0 refills | Status: AC | PRN

## 2019-01-16 MED ORDER — SENNA 8.6 MG PO TABS: 1.0000 | ORAL_TABLET | Freq: Two times a day (BID) | ORAL | 0 refills | Status: AC

## 2019-01-16 MED ORDER — HYDROCODONE-ACETAMINOPHEN 10-325 MG PO TABS: 1.0000 | ORAL_TABLET | Freq: Four times a day (QID) | ORAL | 0 refills | Status: AC | PRN

## 2019-01-16 MED ORDER — BUMETANIDE 1 MG PO TABS: 1.0000 mg | ORAL_TABLET | Freq: Two times a day (BID) | ORAL | Status: DC | PRN

## 2019-01-16 MED ORDER — HYDROCODONE-ACETAMINOPHEN 5-325 MG PO TABS: 2.0000 | ORAL_TABLET | Freq: Four times a day (QID) | ORAL | 0 refills | Status: DC | PRN

## 2019-01-16 NOTE — TOC Progression Note (Signed)
Transition of Care [facility]) - Progression Note    Patient Details  Name: Jasmine Allen MRN: [id number] Date of Birth: 12/30/1965  [facility]) CM/SW Contact  Kyrene Longan, Juliann Pulse, RN Phone Number: 01/16/2019, 12:31 PM  Clinical Narrative:sats are in. [employer]-Awaiting home 02 delivery, & neb machine to rm.            Expected Discharge Plan and Services           Expected Discharge Date: 01/16/19                                     Social Determinants of Health (SDOH) Interventions    Readmission Risk Interventions No flowsheet data found.

## 2019-01-16 NOTE — Progress Notes (Signed)
SATURATION QUALIFICATIONS: (This note is used to comply with regulatory documentation for home oxygen)  Patient Saturations on Room Air at Rest = 94%  Patient Saturations on Room Air while Ambulating = 85%  Patient Saturations on 1 Liters of oxygen while Ambulating = 96%  Please briefly explain why patient needs home oxygen: Acute hypoxemic respiratory failure. Breast cancer with mets to lungs

## 2019-01-16 NOTE — Discharge Summary (Signed)
Physician Discharge Summary  Jasmine Allen YOM:[id number] DOB: 03-Jul-1965 DOA: 01/13/2019  PCP: Aletha Halim., PA-C  Admit date: 01/13/2019 Discharge date: 01/16/2019  Admitted From: home Discharge disposition: Home   Code Status: Full Code  Diet Recommendation: Regular diet   Recommendations for Outpatient Follow-Up:   1. Follow-up with oncologist as an outpatient  Discharge Diagnosis:   Active Problems:   Pain of anterior chest wall with respiration   Abnormal CT scan, lung   Pleural effusion   Dyspnea    History of Present Illness / Brief narrative:  Jasmine Bells Turneris a 53 y.o.femalewith right breast cancer diagnosed in May 2019,status post lumpectomy, chemo, radiation and currently on anastrozole. She follows up with oncologist Dr. Lindi Adie as an outpatient.  For the last 3 months, patient is having persistent cough and shortness of breath on exertion.  Patient had normal echocardiogram 2 weeks prior to presentation.   Per oncology note from 10/2, recent chest CT scan showed evidence of carcinomatosis in the lungs and pericardial thickening concerning for metastatic breast cancer. PET CT scan was performed showing lung infiltrate suspicious for lymphangitic spread versus infection. Also showed hypermetabolic activity and sclerotic lesions in the bones raising concerns formetastasis to bone and left lobe of liver. She was started presumptively on Bactrim and valacyclovir at that time and was planned for further work-up as an outpatient. Because of her non-remitting symptoms, her oncologist sent her to the hospital as a direct admit on 10/5. Pulmonary Dr. Tamala Julian did a thoracentesis on the right releasing 400 mL of cloudy pleural fluid. Patient was admitted under hospitalist service for further vascular management.  Subjective:  Patient was seen and examined this morning.  Middle-aged Caucasian female.  Cough partially relieved with medicines.  Oncology note  reviewed.  Patient has been started on hormonal therapy which she will continue as an outpatient.  Feels okay to discharge home today.  Hospital Course:  Acute hypoxemic respiratory failure -Presented with dyspnea and exertion and persistent intractable cough,refractory to steroids and antibiotics -Oncology and pulmonary consult appreciated. -Underwent thoracentesis of right pleural effusion,400 mldrained on 10/5. -Pleural fluid cytology as well as bone marrow biopsy report showed adenocarcinoma, metastatic from breast -Oncology note reviewed.  Patient has been started on hormonal therapy which she will continue as an outpatient. -Continue benzonatate at home for cough. -O2 sat down to 86% on ambulation on room air.  Home oxygen ordered. -Also to continue nebulizers and inhalers at home.  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 Lindi Adie involved. -Pleural fluid cytology as well as bone marrow biopsy report showed adenocarcinoma, metastatic from breast -Oncology note reviewed.  Patient has been started on hormonal therapy which she will continue as an outpatient.  Anxiety - Xanax as needed  Hyperlipidemia - statin  Bilateral pedal edema -Patient states post chemotherapy, she started having bilateral pedal edema requiring Bumex twice daily. Echocardiogram normal. -Patient does not have pedal edema at this time and her blood pressure is running low.  Bumex has been stopped.  Okay to resume at home on PRN basis.  GERD -Protonix.  Stable for discharge home today.  Discharge Exam:   Vitals:   01/15/19 1401 01/15/19 2031 01/16/19 0539 01/16/19 0550  BP: 119/72 103/65 99/62   Pulse: 74 78 68   Resp: 16 18 17    Temp: 98.4 F (36.9 C) 98.5 F (36.9 C) 98.4 F (36.9 C)   TempSrc: Oral Oral Oral   SpO2: 93% 91% Marland Kitchen)  89% 92%  Weight:      Height:        Body mass index is 22 kg/m.  General exam: Appears calm and comfortable.  Skin: No  rashes, lesions or ulcers. HEENT: Atraumatic, normocephalic, supple neck, no obvious bleeding Lungs: Clear to auscultation bilaterally, cough and deep breathing CVS: Regular rate and rhythm, no murmur GI/Abd soft, nontender, nondistended, bowel sound present CNS: Alert, awake, oriented x3 Psychiatry: Mood appropriate Extremities: No pedal edema, no calf tenderness  Discharge Instructions:  Wound care: None Discharge Instructions    Diet general   Complete by: As directed    For home use only DME Nebulizer machine   Complete by: As directed    Patient needs a nebulizer to treat with the following condition: Metastasis to lung (HCC)   Length of Need: Lifetime   For home use only DME oxygen   Complete by: As directed    86% O2 sat on walking on room air   Length of Need: Lifetime   Mode or (Route): Nasal cannula   Liters per Minute: 2   Frequency: Continuous (stationary and portable oxygen unit needed)   Oxygen conserving device: Yes   Oxygen delivery system: Gas   Increase activity slowly   Complete by: As directed      Follow-up Information    [facility]. Schedule an appointment as soon as possible for a visit in 2 week(s).   Specialty: Pulmonology Contact information: [place] [id number] [phone]         Allergies as of 01/16/2019      Reactions   Amoxicillin-pot Clavulanate Other (See Comments)   Big doses cause diarrhea    Erythromycin Rash   Metoclopramide Hcl Other (See Comments)   Restless legs   Sulfonamide Derivatives Rash      Medication List    STOP taking these medications   albuterol 108 (90 Base) MCG/ACT inhaler Commonly known as: VENTOLIN HFA Replaced by: albuterol (2.5 MG/3ML) 0.083% nebulizer solution   methocarbamol 500 MG tablet Commonly known as: ROBAXIN   sulfamethoxazole-trimethoprim 800-160 MG tablet Commonly known as: BACTRIM DS   valACYclovir 1000 MG tablet Commonly known  as: VALTREX     TAKE these medications   albuterol (2.5 MG/3ML) 0.083% nebulizer solution Commonly known as: PROVENTIL Inhale 3 mLs into the lungs every 6 (six) hours as needed for wheezing or shortness of breath (cough). Replaces: albuterol 108 (90 Base) MCG/ACT inhaler   ALPRAZolam 0.5 MG tablet Commonly known as: XANAX Take 0.5 mg by mouth at bedtime as needed for anxiety or sleep.   atorvastatin 20 MG tablet Commonly known as: LIPITOR Take 20 mg by mouth daily.   benzonatate 100 MG capsule Commonly known as: TESSALON Take 1 capsule (100 mg total) by mouth 3 (three) times daily.   Biotin 10000 MCG Tabs Take 10,000 mcg by mouth daily.   bumetanide 1 MG tablet Commonly known as: BUMEX Take 1 tablet (1 mg total) by mouth 2 (two) times daily as needed (leg swelling). What changed: See the new instructions.   cetirizine 10 MG tablet Commonly known as: ZYRTEC Take 10 mg by mouth daily.   chlorpheniramine-HYDROcodone 10-8 MG/5ML Suer Commonly known as: TUSSIONEX Take 5 mLs by mouth 2 (two) times daily.   cloNIDine 0.3 MG tablet Commonly known as: CATAPRES Take 0.3 mg by mouth at bedtime.   cyclobenzaprine 10 MG tablet Commonly known as: FLEXERIL Take 10 mg by mouth every  8 (eight) hours as needed for muscle spasms.   HYDROcodone-acetaminophen 5-325 MG tablet Commonly known as: NORCO/VICODIN Take 2 tablets by mouth every 6 (six) hours as needed. What changed:   how much to take  when to take this   hydrOXYzine 10 MG tablet Commonly known as: ATARAX/VISTARIL Take 10-30 mg by mouth at bedtime as needed for itching.   hyoscyamine 0.125 MG SL tablet Commonly known as: LEVSIN SL dissolve 1 tablet under the tongue every 4 hours if needed What changed: See the new instructions.   ibuprofen 800 MG tablet Commonly known as: ADVIL Take 1 tablet (800 mg total) by mouth every 6 (six) hours as needed for moderate pain.   letrozole 2.5 MG tablet Commonly known as:  FEMARA Take 1 tablet (2.5 mg total) by mouth daily.   LORazepam 0.5 MG tablet Commonly known as: ATIVAN Take 1 tablet daily, as needed for nausea.   multivitamin with minerals Tabs tablet Take 1 tablet by mouth daily.   pantoprazole 40 MG tablet Commonly known as: PROTONIX Take 1 tablet (40 mg total) by mouth 2 (two) times daily before a meal. What changed: when to take this   predniSONE 20 MG tablet Commonly known as: DELTASONE Take 1 tablet (20 mg total) by mouth daily with breakfast for 5 days. Start taking on: January 17, 2019   senna 8.6 MG Tabs tablet Commonly known as: SENOKOT Take 1 tablet (8.6 mg total) by mouth 2 (two) times daily.   traZODone 100 MG tablet Commonly known as: DESYREL Take 100 mg by mouth at bedtime.            Durable Medical Equipment  (From admission, onward)         Start     Ordered   01/16/19 0000  For home use only DME oxygen    Comments: 86% O2 sat on walking on room air  Question Answer Comment  Length of Need Lifetime   Mode or (Route) Nasal cannula   Liters per Minute 2   Frequency Continuous (stationary and portable oxygen unit needed)   Oxygen conserving device Yes   Oxygen delivery system Gas      01/16/19 0915   01/16/19 0000  For home use only DME Nebulizer machine    Question Answer Comment  Patient needs a nebulizer to treat with the following condition Metastasis to lung [facility])   Length of Need Lifetime      01/16/19 0915          Time coordinating discharge: 35 minutes  The results of significant diagnostics from this hospitalization (including imaging, microbiology, ancillary and laboratory) are listed below for reference.    Procedures and Diagnostic Studies:   Dg Chest 1 View  Result Date: 01/13/2019 CLINICAL DATA:  Post thoracentesis on the right. History of hypertension. EXAM: CHEST  1 VIEW COMPARISON:  Radiographs earlier today.  PET-CT 01/09/2019. FINDINGS: 1720 hours. The right pleural effusion  has decreased in volume. There is no evidence of pneumothorax. Diffuse bilateral interstitial pulmonary opacities are stable. There is improved aeration of the right lung base. A small left pleural effusion remains. IMPRESSION: Decreased right pleural effusion and no evidence of pneumothorax following thoracentesis. Electronically Signed   By: Richardean Sale M.D.   On: 01/13/2019 18:10   Dg Chest 2 View  Result Date: 01/13/2019 CLINICAL DATA:  Worsening shortness of breath over the last month. Generalized abdominal pain. History of metastatic breast cancer. EXAM: CHEST - 2 VIEW COMPARISON:  PET-CT 01/09/2019, chest CT 01/06/2019 and chest radiographs 12/30/2018 and 12/16/2018. FINDINGS: The heart size and mediastinal contours are stable. There are small bilateral pleural effusions which appear unchanged from the recent CT and PET-CT. Coarse interstitial markings are again noted throughout both lungs with increased focal airspace disease at the right lung base, primarily in the middle lobe. There is a thoracolumbar scoliosis. IMPRESSION: Progressive coarsening of the interstitial markings and right middle lobe airspace disease compared with prior chest radiographs, but without gross change from the most recent PET-CT of 4 days ago. Again, findings may reflect lymphangitic spread of tumor with superimposed inflammation on the right. Electronically Signed   By: Richardean Sale M.D.   On: 01/13/2019 16:27     Labs:   Basic Metabolic Panel: Recent Labs  Lab 01/13/19 1842 01/14/19 0518 01/15/19 0447  NA 134*  --  132*  K 4.0  --  3.5  CL 97*  --  96*  CO2 28  --  26  GLUCOSE 108*  --  100*  BUN 17  --  20  CREATININE 0.93  --  0.97  CALCIUM 9.0  --  8.4*  MG  --  2.2  --   PHOS  --  4.4  --    GFR Estimated Creatinine Clearance: 49.3 mL/min (by C-G formula based on SCr of 0.97 mg/dL). Liver Function Tests: Recent Labs  Lab 01/13/19 1842  AST 85*  ALT 32  ALKPHOS 400*  BILITOT 0.5    PROT 6.7  ALBUMIN 3.3*   No results for input(s): LIPASE, AMYLASE in the last 168 hours. No results for input(s): AMMONIA in the last 168 hours. Coagulation profile Recent Labs  Lab 01/14/19 0518  INR 1.1    CBC: Recent Labs  Lab 01/13/19 1842 01/14/19 0518 01/15/19 0447  WBC 4.0 3.5* 4.4  NEUTROABS 2.5 1.7 2.9  HGB 12.9 11.5* 10.7*  HCT 37.7 34.4* 31.2*  MCV 85.1 86.0 84.8  PLT 95* 88* 82*   Cardiac Enzymes: No results for input(s): CKTOTAL, CKMB, CKMBINDEX, TROPONINI in the last 168 hours. BNP: Invalid input(s): POCBNP CBG: Recent Labs  Lab 01/09/19 1430  GLUCAP 95   D-Dimer No results for input(s): DDIMER in the last 72 hours. Hgb A1c No results for input(s): HGBA1C in the last 72 hours. Lipid Profile No results for input(s): CHOL, HDL, LDLCALC, TRIG, CHOLHDL, LDLDIRECT in the last 72 hours. Thyroid function studies Recent Labs    01/14/19 0518  TSH 4.161   Anemia work up No results for input(s): VITAMINB12, FOLATE, FERRITIN, TIBC, IRON, RETICCTPCT in the last 72 hours. Microbiology Recent Results (from the past 240 hour(s))  SARS CORONAVIRUS 2 (TAT 6-24 HRS)     Status: None   Collection Time: 01/08/19 11:30 AM  Result Value Ref Range Status   SARS Coronavirus 2 NEGATIVE NEGATIVE Final    Comment: (NOTE) SARS-CoV-2 target nucleic acids are NOT DETECTED. The SARS-CoV-2 RNA is generally detectable in upper and lower respiratory specimens during the acute phase of infection. Negative results do not preclude SARS-CoV-2 infection, do not rule out co-infections with other pathogens, and should not be used as the sole basis for treatment or other patient management decisions. Negative results must be combined with clinical observations, patient history, and epidemiological information. The expected result is Negative. Fact Sheet for Patients: SugarRoll.be Fact Sheet for Healthcare  Providers: https://www.woods-mathews.com/ This test is not yet approved or cleared by the [place] FDA and  has been authorized for detection  and/or diagnosis of SARS-CoV-2 by FDA under an Emergency Use Authorization (EUA). This EUA will remain  in effect (meaning this test can be used) for the duration of the COVID-19 declaration under Section 56 4(b)(1) of the Act, 21 U.S.C. section 360bbb-3(b)(1), unless the authorization is terminated or revoked sooner. Performed at [facility], [place] [address], [place], [place] [address]   Body fluid culture (includes gram stain)     Status: None (Preliminary result)   Collection Time: 01/13/19  5:17 PM   Specimen: Pleural Fluid  Result Value Ref Range Status   Specimen Description   Final    PLEURAL Performed at [place] [address], [place], [place] [address]    Special Requests   Final    NONE Performed at [facility], [place] [address] [place], [place] [address]    Gram Stain   Final    FEW WBC PRESENT, PREDOMINANTLY MONONUCLEAR NO ORGANISMS SEEN    Culture   Final    NO GROWTH 2 DAYS Performed at [place] [address] [place],  [address]    Report Status PENDING  Incomplete    Please note: You were cared for by a hospitalist during your hospital stay. Once you are discharged, your primary care physician will handle any further medical issues. Please note that NO REFILLS for any discharge medications will be authorized once you are discharged, as it is imperative that you return to your primary care physician (or establish a relationship with a primary care physician if you do not have one) for your post hospital discharge needs so that they can reassess your need for medications and monitor your lab values.  Signed: Terrilee Croak  [facility] 01/16/2019, 9:41 AM

## 2019-01-16 NOTE — TOC Transition Note (Signed)
Transition of Care [facility]) - CM/SW Discharge Note   Patient Details  Name: Jasmine Allen MRN: [id number] Date of Birth: [birth year]  [facility]) CM/SW Contact:  Dessa Phi, RN Phone Number: 01/16/2019, 1:39 PM   Clinical Narrative:  [employer] rep Zack has home 02 order,neb machine order,sats are in-they will deliver travel tank to rm,& neb machine. They will also deliver home 02 set up to patients home. No further CM needs.     Final next level of care: Home/Self Care Barriers to Discharge: No Barriers Identified   Patient Goals and CMS Choice   CMS Medicare.gov Compare Post Acute Care list provided to:: Patient    Discharge Placement                       Discharge Plan and Services                DME Arranged: Oxygen DME Agency: [employer] Date DME Agency Contacted: 01/16/19 Time DME Agency Contacted: [phone] Representative spoke with at DME Agency: [place] ([place]) Interventions     Readmission Risk Interventions No flowsheet data found.

## 2019-01-16 NOTE — TOC Progression Note (Signed)
Transition of Care [facility]) - Progression Note    Patient Details  Name: Jasmine Allen MRN: [id number] Date of Birth: 05-29-1965  Transition of Care [facility]) CM/SW Contact  Kathlynn Swofford, Juliann Pulse, RN Phone Number: 01/16/2019, 9:28 AM  Clinical Narrative: Awaiting 02 sats to be documented-ra @ rest,ambulating,recovery-Nurse aware. [employer] dme awaiting 02 sats for delivery home 02 witin 4hr time frame-also neb machine to be delivered to rm prior dc.           Expected Discharge Plan and Services           Expected Discharge Date: 01/16/19                                     Social Determinants of Health (SDOH) Interventions    Readmission Risk Interventions No flowsheet data found.

## 2019-01-17 DIAGNOSIS — D696 Thrombocytopenia, unspecified: Secondary | ICD-10-CM | POA: Diagnosis not present

## 2019-01-17 DIAGNOSIS — C50311 Malignant neoplasm of lower-inner quadrant of right female breast: Secondary | ICD-10-CM | POA: Diagnosis not present

## 2019-01-17 DIAGNOSIS — R918 Other nonspecific abnormal finding of lung field: Secondary | ICD-10-CM | POA: Diagnosis not present

## 2019-01-17 DIAGNOSIS — Z791 Long term (current) use of non-steroidal anti-inflammatories (NSAID): Secondary | ICD-10-CM | POA: Diagnosis not present

## 2019-01-17 DIAGNOSIS — C770 Secondary and unspecified malignant neoplasm of lymph nodes of head, face and neck: Secondary | ICD-10-CM | POA: Diagnosis not present

## 2019-01-17 DIAGNOSIS — Z9221 Personal history of antineoplastic chemotherapy: Secondary | ICD-10-CM | POA: Diagnosis not present

## 2019-01-17 DIAGNOSIS — C773 Secondary and unspecified malignant neoplasm of axilla and upper limb lymph nodes: Secondary | ICD-10-CM | POA: Diagnosis not present

## 2019-01-17 DIAGNOSIS — C7801 Secondary malignant neoplasm of right lung: Secondary | ICD-10-CM | POA: Diagnosis not present

## 2019-01-17 DIAGNOSIS — K769 Liver disease, unspecified: Secondary | ICD-10-CM | POA: Diagnosis not present

## 2019-01-17 DIAGNOSIS — Z79899 Other long term (current) drug therapy: Secondary | ICD-10-CM | POA: Diagnosis not present

## 2019-01-17 DIAGNOSIS — Z923 Personal history of irradiation: Secondary | ICD-10-CM | POA: Diagnosis not present

## 2019-01-17 DIAGNOSIS — Z170 Estrogen receptor positive status [ER+]: Secondary | ICD-10-CM | POA: Diagnosis not present

## 2019-01-17 DIAGNOSIS — Z79811 Long term (current) use of aromatase inhibitors: Secondary | ICD-10-CM | POA: Diagnosis not present

## 2019-01-17 LAB — BODY FLUID CULTURE: Culture: NO GROWTH

## 2019-01-18 ENCOUNTER — Other Ambulatory Visit: Payer: Self-pay | Admitting: Hematology and Oncology

## 2019-01-20 ENCOUNTER — Encounter (HOSPITAL_COMMUNITY): Payer: Self-pay | Admitting: Hematology and Oncology

## 2019-01-22 ENCOUNTER — Other Ambulatory Visit (HOSPITAL_COMMUNITY): Payer: 59

## 2019-01-22 ENCOUNTER — Ambulatory Visit: Payer: 59 | Admitting: Adult Health

## 2019-01-24 ENCOUNTER — Inpatient Hospital Stay (HOSPITAL_BASED_OUTPATIENT_CLINIC_OR_DEPARTMENT_OTHER): Payer: 59 | Admitting: Hematology and Oncology

## 2019-01-24 ENCOUNTER — Telehealth: Payer: Self-pay

## 2019-01-24 ENCOUNTER — Ambulatory Visit: Payer: 59 | Admitting: Cardiovascular Disease

## 2019-01-24 ENCOUNTER — Telehealth: Payer: Self-pay | Admitting: Pharmacist

## 2019-01-24 ENCOUNTER — Ambulatory Visit (HOSPITAL_COMMUNITY)
Admission: RE | Admit: 2019-01-24 | Discharge: 2019-01-24 | Disposition: A | Discharge status: Home / Self Care | Payer: 59 | Source: Ambulatory Visit | Attending: Hematology and Oncology | Admitting: Hematology and Oncology

## 2019-01-24 ENCOUNTER — Other Ambulatory Visit: Payer: Self-pay

## 2019-01-24 ENCOUNTER — Other Ambulatory Visit: Payer: Self-pay | Admitting: Hematology and Oncology

## 2019-01-24 ENCOUNTER — Inpatient Hospital Stay: Payer: 59

## 2019-01-24 DIAGNOSIS — Z170 Estrogen receptor positive status [ER+]: Secondary | ICD-10-CM

## 2019-01-24 DIAGNOSIS — C50311 Malignant neoplasm of lower-inner quadrant of right female breast: Secondary | ICD-10-CM | POA: Diagnosis not present

## 2019-01-24 LAB — CMP (CANCER CENTER ONLY)
ALT: 60 U/L — ABNORMAL HIGH (ref 0–44)
AST: 87 U/L — ABNORMAL HIGH (ref 15–41)
Albumin: 3 g/dL — ABNORMAL LOW (ref 3.5–5.0)
Alkaline Phosphatase: 735 U/L — ABNORMAL HIGH (ref 38–126)
Anion gap: 13 (ref 5–15)
BUN: 14 mg/dL (ref 6–20)
CO2: 30 mmol/L (ref 22–32)
Calcium: 9.1 mg/dL (ref 8.9–10.3)
Chloride: 93 mmol/L — ABNORMAL LOW (ref 98–111)
Creatinine: 0.82 mg/dL (ref 0.44–1.00)
GFR, Est AFR Am: >60 mL/min (ref >60)
GFR, Estimated: >60 mL/min (ref >60)
Glucose, Bld: 142 mg/dL — ABNORMAL HIGH (ref 70–99)
Potassium: 3.6 mmol/L (ref 3.5–5.1)
Sodium: 136 mmol/L (ref 135–145)
Total Bilirubin: 0.5 mg/dL (ref 0.3–1.2)
Total Protein: 6.5 g/dL (ref 6.5–8.1)

## 2019-01-24 LAB — CBC WITH DIFFERENTIAL (CANCER CENTER ONLY)
Abs Immature Granulocytes: 0.36 10*3/uL — ABNORMAL HIGH (ref 0.00–0.07)
Basophils Absolute: 0 10*3/uL (ref 0.0–0.1)
Basophils Relative: 1 %
Eosinophils Absolute: 0 10*3/uL (ref 0.0–0.5)
Eosinophils Relative: 1 %
HCT: 35.3 % — ABNORMAL LOW (ref 36.0–46.0)
Hemoglobin: 12 g/dL (ref 12.0–15.0)
Immature Granulocytes: 8 %
Lymphocytes Relative: 29 %
Lymphs Abs: 1.2 10*3/uL (ref 0.7–4.0)
MCH: 29 pg (ref 26.0–34.0)
MCHC: 34 g/dL (ref 30.0–36.0)
MCV: 85.3 fL (ref 80.0–100.0)
Monocytes Absolute: 0.4 10*3/uL (ref 0.1–1.0)
Monocytes Relative: 9 %
Neutro Abs: 2.3 10*3/uL (ref 1.7–7.7)
Neutrophils Relative %: 52 %
Platelet Count: 74 10*3/uL — ABNORMAL LOW (ref 150–400)
RBC: 4.14 MIL/uL (ref 3.87–5.11)
RDW: 14.2 % (ref 11.5–15.5)
WBC Count: 4.3 10*3/uL (ref 4.0–10.5)
nRBC: 3.5 % — ABNORMAL HIGH (ref 0.0–0.2)

## 2019-01-24 MED ORDER — ALPRAZOLAM 0.5 MG PO TABS: 0.5000 mg | ORAL_TABLET | Freq: Every evening | ORAL | 3 refills | Status: DC | PRN

## 2019-01-24 MED ORDER — OXYCODONE-ACETAMINOPHEN 10-325 MG PO TABS: 1.0000 | ORAL_TABLET | ORAL | 0 refills | Status: DC | PRN

## 2019-01-24 MED ORDER — PALBOCICLIB 125 MG PO CAPS: 125.0000 mg | ORAL_CAPSULE | Freq: Every day | ORAL | 1 refills | Status: DC

## 2019-01-24 NOTE — Telephone Encounter (Signed)
Oral Oncology Patient Advocate Encounter  Received notification from [employer] that prior authorization for Jasmine Allen is required.  PA submitted on CoverMyMeds Key [id number] Status is pending  Oral Oncology Clinic will continue to follow.  [place] [facility] [place] Phone [phone] Fax [phone] 01/24/2019    11:56 AM

## 2019-01-24 NOTE — Assessment & Plan Note (Signed)
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  Diffuse bone marrow hypermetabolism worrisome for metastatic disease.  Prior pericardial effusion resolved.  01/16/2019:Pleural effusion: Thoracentesis revealed metastatic adenocarcinoma breast primary ER 75%, PR 50%, HER-2 -1+ Bone marrow biopsy positive for metastatic breast cancer -------------------------------------------------------------------------------------------------------------------------------------------------------------- Current treatment: Letrozole 2.5 mg daily started 01/15/2019, Ibrance started 01/24/2019 Severe cough and shortness of breath: Due to lymphangitic spread of carcinoma in the lung.  Status post thoracentesis  Return to clinic in 2 weeks for labs and follow-up

## 2019-01-24 NOTE — Telephone Encounter (Signed)
Oral Chemotherapy Pharmacist Encounter   I spoke with patient and husband in exam room for overview of: Ibrance (palbociclib) for the 1st line treatment of metastatic, hormone receptor positive, HER2 receptor negative breast cancer in conjunction with Femara (letrozole), planned duration until disease progression or unacceptable toxicity.  Counseled patient on administration, dosing, side effects, monitoring, drug-food interactions, safe handling, storage, and disposal.  Patient will take Ibrance 143m tablets, 1 tablet by mouth once daily, with or without food, taken for 3 weeks on, 1 week off, and repeated.  Patient knows to avoid grapefruit and grapefruit juice while on treatment with Ibrance.  Patient is taking Femara once daily with breakfast.  Ibrance start date: 01/25/19  Adverse effects include but are not limited to: fatigue, hair loss, GI upset, nausea, decreased blood counts, and increased upper respiratory infections.  Patient will obtain anti diarrheal and alert the office of 4 or more loose stools above baseline.  Patient reminded of WBC check on Cycle 1 Day 14 for dose and ANC assessment.  Reviewed with patient importance of keeping a medication schedule and plan for any missed doses.  Medication reconciliation performed and medication/allergy list updated. Patient informed about moderate interaction with atorvastatin and to alert the office with new muscle pain. Patient informed about moderate interactions with trazodone and alprazolam, patient will mindful of increased action of these medications.  Insurance authorization for ILeslee Homewill be obtained. We will follow-up with patient with medication acquisition details once insurance authorization is obtained.  Dispensed samples to patient for immediate start:  Medication: Ibrance 125 mg tablets Instructions: Take 1 tablet by mouth once daily, with or without food, take for 21 days on, 7 days off, repeat every 28  days Quantity dispensed: 21 Days supply: 28 Manufacturer: Pfizer Lot: [id number]: 11/2019  All questions answered.  Mr. and Mrs. Cullinane voiced understanding and appreciation.   Patient knows to call the office with questions or concerns.  JJohny Drilling PharmD, BCPS, BCOP  01/24/2019   10:48 AM Oral Oncology Clinic [phone]

## 2019-01-24 NOTE — Telephone Encounter (Signed)
Oral Oncology Pharmacist Encounter  Received new prescription for Ibrance (palbociclib) for the 1st line treatment of metastatic, hormone receptor positive, HER2 receptor negative breast cancer in conjunction with Femara (letrozole), planned duration until disease progression or unacceptable toxicity.  Labs from 01/15/19 assessed, OK for treatment initiation. Noted patient with moderate thrombocytopenia, pltc=82k, dose reduction at initiation will be discussed with MD  Current medication list in Epic reviewed, moderate DDIs with palbociclib identified:  All category C interactions: palbociclib is a moderate inhibitor of CYP3A4 leading to possible decreased metabolism and increased systemic exposure to patient's atorvastatin, trazodone, and alprazolam. Patient will be counseled about possible increased effects of these medication. No change to current therapy is indicated at this time.  Prescription has been e-scribed to the [facility] for benefits analysis and approval by MD.  Oral Oncology Clinic will continue to follow for insurance authorization, copayment issues, initial counseling and start date.  Johny Drilling, PharmD, BCPS, BCOP  01/24/2019 9:24 AM Oral Oncology Clinic [phone]

## 2019-01-24 NOTE — Progress Notes (Signed)
Patient Care Team: Aletha Halim., PA-C as PCP - General (Family Medicine) Nicholas Lose, MD as Consulting Physician (Hematology and Oncology) Kyung Rudd, MD as Consulting Physician (Radiation Oncology) Rolm Bookbinder, MD as Consulting Physician (General Surgery)  DIAGNOSIS:    ICD-10-CM   1. Malignant neoplasm of lower-inner quadrant of right breast of female, estrogen receptor positive ([place])  C50.311 CBC with Differential ([place] Only)   Z17.0 [place] ([place] only)    CBC with Differential ([place] Only)    [place] ([place] only)    DG Chest 2 View    SUMMARY OF ONCOLOGIC HISTORY: Oncology History  Malignant neoplasm of lower-inner quadrant of right breast of female, estrogen receptor positive ([place])  08/16/2017 Initial Diagnosis   Right lumpectomy: 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  High risk   08/16/2017 Cancer Staging   Staging form: Breast, AJCC 8th Edition - Pathologic stage from 08/16/2017: Stage IA (pT1c, pN0, cM0, G2, ER+, PR+, HER2-, Oncotype DX score: 31) - Signed by Gardenia Phlegm, NP on 09/12/2018   09/25/2017 - 02/05/2018 Chemotherapy   Adjuvant chemotherapy with dose dense Adriamycin and Cytoxan x4 followed by Taxol weekly x12    03/11/2018 - 04/25/2018 Radiation Therapy   Adjuvant XRT   01/09/2019 PET scan   Pulmonary hypermetabolic and consistent with lymphangitic tumor spread, confluent right middle lobe consolidation, hypermetabolic abdominal, cervical and left axillary lymph nodes, diffuse liver hypermetabolic some nonspecific.  Diffuse bone marrow hypermetabolism worrisome for metastatic disease.  Prior pericardial effusion resolved.   01/16/2019 Relapse/Recurrence   Pleural effusion:  Thoracentesis revealed metastatic adenocarcinoma breast primary ER 75%, PR 50%, HER-2 -1+ Bone marrow biopsy positive for metastatic breast cancer     CHIEF COMPLIANT: Follow-up of metastatic breast cancer to discuss treatment options  INTERVAL HISTORY: Jasmine Allen is a 53 y.o. with above-mentioned history of metastatic breast cancer. She was admitted from 10/5-10/8 and underwent a thoracentesis for which pathology showed metastatic carcinoma, HER-2 negative (1+), ER 75%, PR 50%. Bone marrow biopsy on 01/14/19 showed metastatic carcinoma, normocytic anemia, and thrombocytopenia. She presents to the clinic today to discuss treatment options.   REVIEW OF SYSTEMS:   Constitutional: Denies fevers, chills or abnormal weight loss Eyes: Denies blurriness of vision Ears, nose, mouth, throat, and face: Denies mucositis or sore throat Respiratory: Continued shortness of breath, persistent cough at nighttime.  She feels that the breathing and coughing had improved after hospitalization but they appear to be getting worse now. Cardiovascular: Denies palpitation, chest discomfort Gastrointestinal: Denies nausea, heartburn or change in bowel habits Skin: Denies abnormal skin rashes Lymphatics: Denies new lymphadenopathy or easy bruising Neurological: Denies numbness, tingling or new weaknesses Behavioral/Psych: Mood is stable, no new changes  Extremities: No lower extremity edema Breast: denies any pain or lumps or nodules in either breasts All other systems were reviewed with the patient and are negative.  I have reviewed the past medical history, past surgical history, social history and family history with the patient and they are unchanged from previous note.  ALLERGIES:  is allergic to amoxicillin-pot clavulanate; erythromycin; metoclopramide hcl; and sulfonamide derivatives.  MEDICATIONS:  Current Outpatient Medications  Medication Sig Dispense Refill  . albuterol (PROVENTIL) (2.5 MG/3ML)  0.083% nebulizer  solution Inhale 3 mLs into the lungs every 6 (six) hours as needed for wheezing or shortness of breath (cough). 360 mL 0  . ALPRAZolam (XANAX) 0.5 MG tablet Take 1 tablet (0.5 mg total) by mouth at bedtime as needed for anxiety or sleep. 30 tablet 3  . atorvastatin (LIPITOR) 20 MG tablet Take 20 mg by mouth daily.    . benzonatate (TESSALON) 100 MG capsule Take 1 capsule (100 mg total) by mouth 3 (three) times daily. 90 capsule 0  . Biotin 10000 MCG TABS Take 10,000 mcg by mouth daily.     . bumetanide (BUMEX) 1 MG tablet TAKE 1 TABLET(1 MG) BY MOUTH TWICE DAILY 60 tablet 2  . cetirizine (ZYRTEC) 10 MG tablet Take 10 mg by mouth daily.    . chlorpheniramine-HYDROcodone (TUSSIONEX) 10-8 MG/5ML SUER Take 5 mLs by mouth 4 (four) times daily as needed for up to 15 days for cough. 300 mL 0  . cloNIDine (CATAPRES) 0.3 MG tablet Take 0.3 mg by mouth at bedtime.     . cyclobenzaprine (FLEXERIL) 10 MG tablet Take 10 mg by mouth every 8 (eight) hours as needed for muscle spasms.  1  . hydrOXYzine (ATARAX/VISTARIL) 10 MG tablet Take 10-30 mg by mouth at bedtime as needed for itching.   0  . hyoscyamine (LEVSIN SL) 0.125 MG SL tablet dissolve 1 tablet under the tongue every 4 hours if needed (Patient taking differently: Take 0.125 mg by mouth every 4 (four) hours as needed for cramping. ) 30 tablet 0  . ibuprofen (ADVIL) 800 MG tablet Take 1 tablet (800 mg total) by mouth every 6 (six) hours as needed for moderate pain. 20 tablet 0  . letrozole (FEMARA) 2.5 MG tablet Take 1 tablet (2.5 mg total) by mouth daily. 90 tablet 3  . LORazepam (ATIVAN) 0.5 MG tablet Take 1 tablet daily, as needed for nausea. 30 tablet 1  . Multiple Vitamin (MULITIVITAMIN WITH MINERALS) TABS Take 1 tablet by mouth daily.    Marland Kitchen oxyCODONE-acetaminophen (PERCOCET) 10-325 MG tablet Take 1 tablet by mouth every 4 (four) hours as needed for pain. 30 tablet 0  . palbociclib (IBRANCE) 125 MG capsule Take 1 capsule (125 mg total)  by mouth daily with breakfast. Take whole with food. Take for 21 days on, 7 days off, repeat every 28 days. 21 capsule 1  . pantoprazole (PROTONIX) 40 MG tablet Take 1 tablet (40 mg total) by mouth 2 (two) times daily before a meal. (Patient taking differently: Take 40 mg by mouth daily. ) 60 tablet 6  . senna (SENOKOT) 8.6 MG TABS tablet Take 1 tablet (8.6 mg total) by mouth 2 (two) times daily. 60 tablet 0  . traZODone (DESYREL) 100 MG tablet Take 100 mg by mouth at bedtime.      No current facility-administered medications for this visit.     PHYSICAL EXAMINATION: ECOG PERFORMANCE STATUS: 1 - Symptomatic but completely ambulatory  Vitals:   01/24/19 0939  BP: 109/72  Pulse: (!) 101  Resp: 18  Temp: 98.7 F (37.1 C)  SpO2: 95%   Filed Weights   01/24/19 0939  Weight: 115 lb 12.8 oz (52.5 kg)    GENERAL: alert, no distress and comfortable SKIN: skin color, texture, turgor are normal, no rashes or significant lesions EYES: normal, Conjunctiva are pink and non-injected, sclera clear OROPHARYNX: no exudate, no erythema and lips, buccal mucosa, and tongue normal  NECK: supple, thyroid normal size, non-tender, without nodularity LYMPH: no palpable lymphadenopathy in  the cervical, axillary or inguinal LUNGS: clear to auscultation and percussion with normal breathing effort HEART: regular rate & rhythm and no murmurs and no lower extremity edema ABDOMEN: abdomen soft, non-tender and normal bowel sounds MUSCULOSKELETAL: no cyanosis of digits and no clubbing  NEURO: alert & oriented x 3 with fluent speech, no focal motor/sensory deficits EXTREMITIES: No lower extremity edema  LABORATORY DATA:  I have reviewed the data as listed CMP Latest Ref Rng & Units 01/24/2019 01/15/2019 01/13/2019  Glucose 70 - 99 mg/dL 142(H) 100(H) 108(H)  BUN 6 - 20 mg/dL _0 Creatinine 0.44 - 1.00 mg/dL 0.82 0.97 0.93  Sodium 135 - 145 mmol/L 136 132(L) 134(L)  Potassium 3.5 - 5.1 mmol/L 3.6 3.5 4.0   Chloride 98 - 111 mmol/L 93(L) 96(L) 97(L)  CO2 22 - 32 mmol/L _1 Calcium 8.9 - 10.3 mg/dL 9.1 8.4(L) 9.0  Total Protein 6.5 - 8.1 g/dL 6.5 - 6.7  Total Bilirubin 0.3 - 1.2 mg/dL 0.5 - 0.5  Alkaline Phos 38 - 126 U/L 735(H) - 400(H)  AST 15 - 41 U/L 87(H) - 85(H)  ALT 0 - 44 U/L 60(H) - 32    Lab Results  Component Value Date   WBC 4.3 01/24/2019   HGB 12.0 01/24/2019   HCT 35.3 (L) 01/24/2019   MCV 85.3 01/24/2019   PLT 74 (L) 01/24/2019   NEUTROABS 2.3 01/24/2019    ASSESSMENT & PLAN:  Malignant neoplasm of lower-inner quadrant of right breast of female, estrogen receptor positive ([facility]) 08/16/2017:Right lumpectomy: Grade 2 IDC 1.5 cm, with DCIS and necrosis, 0/3 lymph nodes negative, ER 95%, PR 30%, HER-2 negative ratio 1.14, Ki-67 40%, T1CN0 stage Ia; resection of the margin 09/11/2017: Benign  01/09/2019: PET CT scan:Pulmonary hypermetabolic and consistent with lymphangitic tumor spread, confluent right middle lobe consolidation, hypermetabolic abdominal, cervical and left axillary lymph nodes, diffuse liver hypermetabolic some nonspecific.  Diffuse bone marrow hypermetabolism worrisome for metastatic disease.  Prior pericardial effusion resolved.  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  Status post thoracentesis Chest x-ray performed today did not reveal any dramatic worsening of the pleural effusion.  We will monitor it.  Persistent cough:  I discussed with her that until Ibrance and letrozole work she will likely continue to have the symptoms.  Thrombocytopenia: Due to bone marrow infiltration.  We discussed about whether or not we should start her at a lower dosage of Ibrance on her stay at 125.  I recommended starting at 125 mg at this time.  Prognosis: Patient understands that there is no cure for her condition and our goal is palliation.  I discussed with her that response to treatment would predict prognosis.  Return to clinic in 2 weeks for labs and follow-up     Orders Placed This Encounter  Procedures  . DG Chest 2 View    Standing Status:   Future    Number of Occurrences:   1    Standing Expiration Date:   02/28/2020    Order Specific Question:   Reason for exam:    Answer:   Shortness of breath and pleural effusion    Order  Specific Question:   Preferred imaging location?    Answer:   [facility]  . CBC with Differential ([place] Only)    Standing Status:   Future    Standing Expiration Date:   01/24/2020  . CMP ([place] only)    Standing Status:   Future    Standing Expiration Date:   01/24/2020  . CBC with Differential ([facility] Only)    Standing Status:   Future    Number of Occurrences:   1    Standing Expiration Date:   01/24/2020  . CMP ([place] only)    Standing Status:   Future    Number of Occurrences:   1    Standing Expiration Date:   01/24/2020   The patient has a good understanding of the overall plan. she agrees with it. she will call with any problems that may develop before the next visit here.  Nicholas Lose, MD 01/24/2019  Julious Oka Dorshimer am acting as scribe for Dr. Nicholas Lose.  I have reviewed the above documentation for accuracy and completeness, and I agree with the above.

## 2019-01-27 NOTE — Telephone Encounter (Signed)
Oral Oncology Patient Advocate Encounter  Prior Authorization for Jasmine Allen has been approved.    PA# [id number] Effective dates: 01/24/19 through 01/24/20  Oral Oncology Clinic will continue to follow.   [place] Patient [place] Phone [phone] Fax [phone] 01/27/2019    8:10 AM

## 2019-02-03 ENCOUNTER — Inpatient Hospital Stay (HOSPITAL_COMMUNITY): Payer: 59

## 2019-02-03 ENCOUNTER — Emergency Department (HOSPITAL_COMMUNITY): Payer: 59

## 2019-02-03 ENCOUNTER — Encounter (HOSPITAL_COMMUNITY): Payer: Self-pay | Admitting: Emergency Medicine

## 2019-02-03 ENCOUNTER — Inpatient Hospital Stay (HOSPITAL_COMMUNITY)
Admission: EM | Admit: 2019-02-03 | Discharge: 2019-02-06 | DRG: 597 | Disposition: A | Discharge status: Home / Self Care | Payer: 59 | Attending: Family Medicine | Admitting: Family Medicine

## 2019-02-03 ENCOUNTER — Other Ambulatory Visit: Payer: Self-pay

## 2019-02-03 DIAGNOSIS — Z888 Allergy status to other drugs, medicaments and biological substances status: Secondary | ICD-10-CM

## 2019-02-03 DIAGNOSIS — Z87442 Personal history of urinary calculi: Secondary | ICD-10-CM | POA: Diagnosis not present

## 2019-02-03 DIAGNOSIS — R079 Chest pain, unspecified: Secondary | ICD-10-CM

## 2019-02-03 DIAGNOSIS — J90 Pleural effusion, not elsewhere classified: Secondary | ICD-10-CM | POA: Diagnosis not present

## 2019-02-03 DIAGNOSIS — E785 Hyperlipidemia, unspecified: Secondary | ICD-10-CM | POA: Diagnosis present

## 2019-02-03 DIAGNOSIS — K219 Gastro-esophageal reflux disease without esophagitis: Secondary | ICD-10-CM | POA: Diagnosis present

## 2019-02-03 DIAGNOSIS — Z20828 Contact with and (suspected) exposure to other viral communicable diseases: Secondary | ICD-10-CM | POA: Diagnosis present

## 2019-02-03 DIAGNOSIS — Z9889 Other specified postprocedural states: Secondary | ICD-10-CM

## 2019-02-03 DIAGNOSIS — C50919 Malignant neoplasm of unspecified site of unspecified female breast: Secondary | ICD-10-CM

## 2019-02-03 DIAGNOSIS — R0789 Other chest pain: Secondary | ICD-10-CM | POA: Diagnosis present

## 2019-02-03 DIAGNOSIS — Z803 Family history of malignant neoplasm of breast: Secondary | ICD-10-CM

## 2019-02-03 DIAGNOSIS — E876 Hypokalemia: Secondary | ICD-10-CM | POA: Diagnosis present

## 2019-02-03 DIAGNOSIS — C50311 Malignant neoplasm of lower-inner quadrant of right female breast: Principal | ICD-10-CM | POA: Diagnosis present

## 2019-02-03 DIAGNOSIS — Z170 Estrogen receptor positive status [ER+]: Secondary | ICD-10-CM

## 2019-02-03 DIAGNOSIS — C7951 Secondary malignant neoplasm of bone: Secondary | ICD-10-CM | POA: Diagnosis present

## 2019-02-03 DIAGNOSIS — T451X5A Adverse effect of antineoplastic and immunosuppressive drugs, initial encounter: Secondary | ICD-10-CM | POA: Diagnosis present

## 2019-02-03 DIAGNOSIS — D61810 Antineoplastic chemotherapy induced pancytopenia: Secondary | ICD-10-CM | POA: Diagnosis present

## 2019-02-03 DIAGNOSIS — E7800 Pure hypercholesterolemia, unspecified: Secondary | ICD-10-CM | POA: Diagnosis present

## 2019-02-03 DIAGNOSIS — K449 Diaphragmatic hernia without obstruction or gangrene: Secondary | ICD-10-CM | POA: Diagnosis present

## 2019-02-03 DIAGNOSIS — Z87891 Personal history of nicotine dependence: Secondary | ICD-10-CM

## 2019-02-03 DIAGNOSIS — Z8249 Family history of ischemic heart disease and other diseases of the circulatory system: Secondary | ICD-10-CM

## 2019-02-03 DIAGNOSIS — I10 Essential (primary) hypertension: Secondary | ICD-10-CM | POA: Diagnosis present

## 2019-02-03 DIAGNOSIS — Z515 Encounter for palliative care: Secondary | ICD-10-CM

## 2019-02-03 DIAGNOSIS — F411 Generalized anxiety disorder: Secondary | ICD-10-CM | POA: Diagnosis present

## 2019-02-03 DIAGNOSIS — F1111 Opioid abuse, in remission: Secondary | ICD-10-CM | POA: Diagnosis present

## 2019-02-03 DIAGNOSIS — I959 Hypotension, unspecified: Secondary | ICD-10-CM | POA: Diagnosis present

## 2019-02-03 DIAGNOSIS — Z79899 Other long term (current) drug therapy: Secondary | ICD-10-CM

## 2019-02-03 DIAGNOSIS — K5900 Constipation, unspecified: Secondary | ICD-10-CM | POA: Diagnosis present

## 2019-02-03 DIAGNOSIS — J910 Malignant pleural effusion: Secondary | ICD-10-CM | POA: Diagnosis present

## 2019-02-03 DIAGNOSIS — Z7189 Other specified counseling: Secondary | ICD-10-CM

## 2019-02-03 DIAGNOSIS — G893 Neoplasm related pain (acute) (chronic): Secondary | ICD-10-CM | POA: Diagnosis present

## 2019-02-03 DIAGNOSIS — Z880 Allergy status to penicillin: Secondary | ICD-10-CM

## 2019-02-03 DIAGNOSIS — R7989 Other specified abnormal findings of blood chemistry: Secondary | ICD-10-CM | POA: Diagnosis present

## 2019-02-03 DIAGNOSIS — C7800 Secondary malignant neoplasm of unspecified lung: Secondary | ICD-10-CM | POA: Diagnosis present

## 2019-02-03 DIAGNOSIS — L659 Nonscarring hair loss, unspecified: Secondary | ICD-10-CM | POA: Diagnosis present

## 2019-02-03 DIAGNOSIS — D701 Agranulocytosis secondary to cancer chemotherapy: Secondary | ICD-10-CM

## 2019-02-03 DIAGNOSIS — F329 Major depressive disorder, single episode, unspecified: Secondary | ICD-10-CM | POA: Diagnosis present

## 2019-02-03 DIAGNOSIS — D696 Thrombocytopenia, unspecified: Secondary | ICD-10-CM

## 2019-02-03 DIAGNOSIS — R1011 Right upper quadrant pain: Secondary | ICD-10-CM | POA: Diagnosis not present

## 2019-02-03 DIAGNOSIS — Z882 Allergy status to sulfonamides status: Secondary | ICD-10-CM

## 2019-02-03 DIAGNOSIS — Z881 Allergy status to other antibiotic agents status: Secondary | ICD-10-CM

## 2019-02-03 DIAGNOSIS — R0600 Dyspnea, unspecified: Secondary | ICD-10-CM

## 2019-02-03 LAB — COMPREHENSIVE METABOLIC PANEL
ALT: 52 U/L — ABNORMAL HIGH (ref 0–44)
AST: 82 U/L — ABNORMAL HIGH (ref 15–41)
Albumin: 3.2 g/dL — ABNORMAL LOW (ref 3.5–5.0)
Alkaline Phosphatase: 381 U/L — ABNORMAL HIGH (ref 38–126)
Anion gap: 11 (ref 5–15)
BUN: 14 mg/dL (ref 6–20)
CO2: 28 mmol/L (ref 22–32)
Calcium: 8.5 mg/dL — ABNORMAL LOW (ref 8.9–10.3)
Chloride: 98 mmol/L (ref 98–111)
Creatinine, Ser: 0.73 mg/dL (ref 0.44–1.00)
GFR calc Af Amer: >60 mL/min (ref >60)
GFR calc non Af Amer: >60 mL/min (ref >60)
Glucose, Bld: 137 mg/dL — ABNORMAL HIGH (ref 70–99)
Potassium: 3.1 mmol/L — ABNORMAL LOW (ref 3.5–5.1)
Sodium: 137 mmol/L (ref 135–145)
Total Bilirubin: 1 mg/dL (ref 0.3–1.2)
Total Protein: 6.1 g/dL — ABNORMAL LOW (ref 6.5–8.1)

## 2019-02-03 LAB — BODY FLUID CELL COUNT WITH DIFFERENTIAL
Eos, Fluid: 0 %
Lymphs, Fluid: 24 %
Monocyte-Macrophage-Serous Fluid: 27 % — ABNORMAL LOW (ref 50–90)
Neutrophil Count, Fluid: 49 % — ABNORMAL HIGH (ref 0–25)
Other Cells, Fluid: 1 %
Total Nucleated Cell Count, Fluid: 345 cu mm (ref 0–1000)

## 2019-02-03 LAB — CBC WITH DIFFERENTIAL/PLATELET
Abs Immature Granulocytes: 0.02 10*3/uL (ref 0.00–0.07)
Basophils Absolute: 0 10*3/uL (ref 0.0–0.1)
Basophils Relative: 0 %
Eosinophils Absolute: 0 10*3/uL (ref 0.0–0.5)
Eosinophils Relative: 0 %
HCT: 28.9 % — ABNORMAL LOW (ref 36.0–46.0)
Hemoglobin: 9.8 g/dL — ABNORMAL LOW (ref 12.0–15.0)
Immature Granulocytes: 2 %
Lymphocytes Relative: 45 %
Lymphs Abs: 0.6 10*3/uL — ABNORMAL LOW (ref 0.7–4.0)
MCH: 28.1 pg (ref 26.0–34.0)
MCHC: 33.9 g/dL (ref 30.0–36.0)
MCV: 82.8 fL (ref 80.0–100.0)
Monocytes Absolute: 0 10*3/uL — ABNORMAL LOW (ref 0.1–1.0)
Monocytes Relative: 3 %
Neutro Abs: 0.6 10*3/uL — ABNORMAL LOW (ref 1.7–7.7)
Neutrophils Relative %: 50 %
Platelets: 16 10*3/uL — CL (ref 150–400)
RBC: 3.49 MIL/uL — ABNORMAL LOW (ref 3.87–5.11)
RDW: 13.7 % (ref 11.5–15.5)
WBC: 1.2 10*3/uL — CL (ref 4.0–10.5)
nRBC: 0 % (ref 0.0–0.2)

## 2019-02-03 LAB — GRAM STAIN: Gram Stain: NONE SEEN

## 2019-02-03 LAB — ABO/RH: ABO/RH(D): O NEG

## 2019-02-03 LAB — SARS CORONAVIRUS 2 (TAT 6-24 HRS): SARS Coronavirus 2: NEGATIVE

## 2019-02-03 LAB — PATHOLOGIST SMEAR REVIEW

## 2019-02-03 LAB — APTT: aPTT: 32 seconds (ref 24–36)

## 2019-02-03 LAB — PROTIME-INR
INR: 1.1 (ref 0.8–1.2)
Prothrombin Time: 14.5 seconds (ref 11.4–15.2)

## 2019-02-03 LAB — D-DIMER, QUANTITATIVE: D-Dimer, Quant: 3.4 ug/mL-FEU — ABNORMAL HIGH (ref 0.00–0.50)

## 2019-02-03 MED ORDER — ONDANSETRON HCL 4 MG/2ML IJ SOLN
4.0000 mg | Freq: Once | INTRAMUSCULAR | Status: AC
  Administered 2019-02-03: 4 mg via INTRAVENOUS
  Filled 2019-02-03: qty 2

## 2019-02-03 MED ORDER — IOHEXOL 350 MG/ML SOLN
100.0000 mL | Freq: Once | INTRAVENOUS | Status: AC | PRN
  Administered 2019-02-03: 100 mL via INTRAVENOUS

## 2019-02-03 MED ORDER — OXYCODONE HCL 5 MG PO TABS
10.0000 mg | ORAL_TABLET | Freq: Once | ORAL | Status: AC
  Administered 2019-02-03: 10 mg via ORAL
  Filled 2019-02-03: qty 2

## 2019-02-03 MED ORDER — MIDODRINE HCL 5 MG PO TABS
5.0000 mg | ORAL_TABLET | Freq: Two times a day (BID) | ORAL | Status: DC
  Administered 2019-02-03 – 2019-02-06 (×8): 5 mg via ORAL
  Filled 2019-02-03 (×10): qty 1

## 2019-02-03 MED ORDER — BENZONATATE 100 MG PO CAPS
100.0000 mg | ORAL_CAPSULE | Freq: Three times a day (TID) | ORAL | Status: DC
  Administered 2019-02-03 – 2019-02-06 (×10): 100 mg via ORAL
  Filled 2019-02-03 (×10): qty 1

## 2019-02-03 MED ORDER — TRAZODONE HCL 50 MG PO TABS
150.0000 mg | ORAL_TABLET | Freq: Every day | ORAL | Status: DC
  Administered 2019-02-03 – 2019-02-05 (×3): 150 mg via ORAL
  Filled 2019-02-03 (×3): qty 3

## 2019-02-03 MED ORDER — ATORVASTATIN CALCIUM 20 MG PO TABS
20.0000 mg | ORAL_TABLET | Freq: Every day | ORAL | Status: DC
  Filled 2019-02-03: qty 2

## 2019-02-03 MED ORDER — ALBUTEROL SULFATE (2.5 MG/3ML) 0.083% IN NEBU: 3.0000 mL | INHALATION_SOLUTION | Freq: Four times a day (QID) | RESPIRATORY_TRACT | Status: DC | PRN

## 2019-02-03 MED ORDER — ALPRAZOLAM 0.5 MG PO TABS
0.5000 mg | ORAL_TABLET | Freq: Every evening | ORAL | Status: DC | PRN
  Administered 2019-02-03 – 2019-02-04 (×2): 0.5 mg via ORAL
  Filled 2019-02-03 (×2): qty 1

## 2019-02-03 MED ORDER — HYOSCYAMINE SULFATE 0.125 MG SL SUBL
0.1250 mg | SUBLINGUAL_TABLET | SUBLINGUAL | Status: DC | PRN
  Administered 2019-02-05: 0.125 mg via ORAL
  Filled 2019-02-03 (×2): qty 1

## 2019-02-03 MED ORDER — OXYCODONE-ACETAMINOPHEN 5-325 MG PO TABS
2.0000 | ORAL_TABLET | Freq: Four times a day (QID) | ORAL | Status: DC | PRN
  Administered 2019-02-03 – 2019-02-04 (×4): 2 via ORAL
  Filled 2019-02-03 (×4): qty 2

## 2019-02-03 MED ORDER — ADULT MULTIVITAMIN W/MINERALS CH
1.0000 | ORAL_TABLET | Freq: Every day | ORAL | Status: DC
  Administered 2019-02-04 – 2019-02-06 (×3): 1 via ORAL
  Filled 2019-02-03 (×4): qty 1

## 2019-02-03 MED ORDER — LORATADINE 10 MG PO TABS
10.0000 mg | ORAL_TABLET | Freq: Every day | ORAL | Status: DC
  Administered 2019-02-03 – 2019-02-06 (×4): 10 mg via ORAL
  Filled 2019-02-03 (×4): qty 1

## 2019-02-03 MED ORDER — ATORVASTATIN CALCIUM 20 MG PO TABS
20.0000 mg | ORAL_TABLET | Freq: Every day | ORAL | Status: DC
  Administered 2019-02-04 – 2019-02-05 (×2): 20 mg via ORAL
  Filled 2019-02-03 (×2): qty 1

## 2019-02-03 MED ORDER — SODIUM CHLORIDE 0.9% IV SOLUTION: Freq: Once | INTRAVENOUS | Status: DC

## 2019-02-03 MED ORDER — ACETAMINOPHEN 650 MG RE SUPP: 650.0000 mg | Freq: Four times a day (QID) | RECTAL | Status: DC | PRN

## 2019-02-03 MED ORDER — CYCLOBENZAPRINE HCL 10 MG PO TABS
10.0000 mg | ORAL_TABLET | Freq: Three times a day (TID) | ORAL | Status: DC | PRN
  Administered 2019-02-03 – 2019-02-05 (×4): 10 mg via ORAL
  Filled 2019-02-03 (×4): qty 1

## 2019-02-03 MED ORDER — BIOTIN 10000 MCG PO TABS: 10000.0000 ug | ORAL_TABLET | Freq: Every day | ORAL | Status: DC

## 2019-02-03 MED ORDER — PANTOPRAZOLE SODIUM 40 MG PO TBEC
40.0000 mg | DELAYED_RELEASE_TABLET | Freq: Two times a day (BID) | ORAL | Status: DC
  Administered 2019-02-03 – 2019-02-06 (×7): 40 mg via ORAL
  Filled 2019-02-03 (×7): qty 1

## 2019-02-03 MED ORDER — TBO-FILGRASTIM 300 MCG/0.5ML ~~LOC~~ SOSY
300.0000 ug | PREFILLED_SYRINGE | Freq: Once | SUBCUTANEOUS | Status: AC
  Administered 2019-02-03: 300 ug via SUBCUTANEOUS
  Filled 2019-02-03: qty 0.5

## 2019-02-03 MED ORDER — ONDANSETRON HCL 4 MG PO TABS
4.0000 mg | ORAL_TABLET | Freq: Four times a day (QID) | ORAL | Status: DC | PRN
  Administered 2019-02-03: 4 mg via ORAL
  Filled 2019-02-03: qty 1

## 2019-02-03 MED ORDER — MORPHINE SULFATE (PF) 4 MG/ML IV SOLN
4.0000 mg | Freq: Once | INTRAVENOUS | Status: AC
  Administered 2019-02-03: 4 mg via INTRAVENOUS
  Filled 2019-02-03: qty 1

## 2019-02-03 MED ORDER — ACETAMINOPHEN 325 MG PO TABS: 650.0000 mg | ORAL_TABLET | Freq: Four times a day (QID) | ORAL | Status: DC | PRN

## 2019-02-03 MED ORDER — SENNA 8.6 MG PO TABS
1.0000 | ORAL_TABLET | Freq: Two times a day (BID) | ORAL | Status: DC
  Administered 2019-02-03: 8.6 mg via ORAL
  Filled 2019-02-03: qty 1

## 2019-02-03 MED ORDER — SODIUM CHLORIDE 0.9 % IV BOLUS
250.0000 mL | Freq: Once | INTRAVENOUS | Status: AC
  Administered 2019-02-03: 250 mL via INTRAVENOUS

## 2019-02-03 MED ORDER — SODIUM CHLORIDE 0.9% FLUSH: 3.0000 mL | INTRAVENOUS | Status: DC | PRN

## 2019-02-03 MED ORDER — SODIUM CHLORIDE 0.9 % IV SOLN: 250.0000 mL | INTRAVENOUS | Status: DC | PRN

## 2019-02-03 MED ORDER — SODIUM CHLORIDE 0.9% FLUSH
3.0000 mL | Freq: Two times a day (BID) | INTRAVENOUS | Status: DC
  Administered 2019-02-03 – 2019-02-05 (×5): 3 mL via INTRAVENOUS

## 2019-02-03 MED ORDER — POTASSIUM CHLORIDE CRYS ER 20 MEQ PO TBCR
40.0000 meq | EXTENDED_RELEASE_TABLET | Freq: Once | ORAL | Status: AC
  Administered 2019-02-03: 40 meq via ORAL
  Filled 2019-02-03: qty 2

## 2019-02-03 MED ORDER — ONDANSETRON HCL 4 MG/2ML IJ SOLN: 4.0000 mg | Freq: Four times a day (QID) | INTRAMUSCULAR | Status: DC | PRN

## 2019-02-03 NOTE — ED Triage Notes (Signed)
Pt comes from home via [facility] c/o RUQ abd pain and rib pain. Pt states she woke up around 3am with pain and shortness of breath. Recent dx of breast cancer with metastasis to lung, liver and bone marrow. Last pain medication was 10mg  oxycodone around 1700 last night.

## 2019-02-03 NOTE — ED Notes (Signed)
Attempt report [facility]

## 2019-02-03 NOTE — ED Notes (Signed)
Report given to [facility] 

## 2019-02-03 NOTE — H&P (Signed)
History and Physical    LATECIA Allen [id number] DOB: 09-Aug-1965 DOA: 02/03/2019  PCP: Aletha Halim., PA-C   Patient coming from:   Chief Complaint:   HPI: Jasmine Allen is a 53 y.o. female with medical history significant for breast cancer with metastasis, anxiety, dyslipidemia, and GERD who follows with Dr. Lindi Adie for chemotherapy.  She was last seen by him on 10/16 at which time she was told that there is no cure for her condition and that they would aim for palliative care.  She presented to the ED at 3 AM this morning with right anterior and lower chest pain.  She states that the pain is sharp and achy and is worse with movement.  There appears to be a pleuritic component with deep breath and coughing that seems to worsen it and she has had a dry cough for the last few days.  She normally wears 2 L nasal cannula oxygen and does not have any swelling in her lower extremities.  She denies any fevers or chills.  No abdominal pain, nausea or vomiting noted.   ED Course: Stable vital signs noted with soft blood pressure readings but no tachycardia noted on EKG or telemetry.  She is noted to have pancytopenia with WBC of 1.2 and absolute neutrophil count of 600.  Platelet count of 16,000 and hemoglobin 9.8.  Potassium is 3.1.  CT angiogram with no signs of PE despite elevated D-dimer.  There is a large right-sided pleural effusion as well as moderate left pleural effusion.  EDP had discussed with Dr. Jana Hakim of oncology who had recommended a blood smear as well as palliative consultation and Neupogen administration.  Review of Systems: All others reviewed as noted above and otherwise negative.  Past Medical History:  Diagnosis Date  . Alopecia, unspecified   . Anemia, unspecified    during her pregnancy  . Anxiety state, unspecified   . Breast cancer ([place]) 2019   Right Breast Cancer  . Bulging disc   . Depressive disorder, not elsewhere classified   . Diverticulosis of colon  (without mention of hemorrhage)    CT Scan  . Dysrhythmia    PVCs in the past  . Esophageal reflux   . Esophagitis 1998  . Hiatal hernia 1998  . History of kidney stones   . Hypercholesteremia   . Hypertension   . Migraine   . Opioid abuse, in remission [facility])     Past Surgical History:  Procedure Laterality Date  . ABLATION    . BREAST LUMPECTOMY Right 08/16/2017  . BREAST LUMPECTOMY WITH AXILLARY LYMPH NODE BIOPSY Right 08/16/2017   Procedure: RIGHT BREAST LUMPECTOMY WITH AXILLARY SENTINEL LYMPH NODE BIOPSY;  Surgeon: Rolm Bookbinder, MD;  Location: [place];  Service: General;  Laterality: Right;  . CARDIAC CATHETERIZATION  2011   normal coronaries  . KNEE SURGERY    . PORTACATH PLACEMENT Right 09/11/2017   Procedure: INSERTION PORT-A-CATH WITH ULTRASOUND;  Surgeon: Rolm Bookbinder, MD;  Location: [place];  Service: General;  Laterality: Right;  . RE-EXCISION OF BREAST LUMPECTOMY Right 09/11/2017   Procedure: RE-EXCISION OF BREAST LUMPECTOMY;  Surgeon: Rolm Bookbinder, MD;  Location: [place];  Service: General;  Laterality: Right;  . TONSILLECTOMY       reports that she quit smoking about 2 years ago. She smoked 0.25 packs per day. She has never used smokeless tobacco. She reports current alcohol use. She reports that she does not use drugs.  Allergies  Allergen Reactions  . Amoxicillin-Pot Clavulanate Other (See Comments)    Big doses cause diarrhea   . Erythromycin Rash  . Metoclopramide Hcl Other (See Comments)    Restless legs  . Sulfonamide Derivatives Rash    Family History  Problem Relation Age of Onset  . Heart disease Father        CABG  . Breast cancer Mother   . Breast cancer Maternal Grandmother   . Breast cancer Cousin   . Colon cancer Neg Hx   . Stomach cancer Neg Hx     Prior to Admission medications   Medication Sig Start Date End Date Taking? Authorizing Provider  albuterol (PROVENTIL) (2.5 MG/3ML) 0.083%  nebulizer solution Inhale 3 mLs into the lungs every 6 (six) hours as needed for wheezing or shortness of breath (cough). 01/16/19 02/15/19 Yes Dahal, Marlowe Aschoff, MD  ALPRAZolam Duanne Moron) 0.5 MG tablet Take 1 tablet (0.5 mg total) by mouth at bedtime as needed for anxiety or sleep. 01/24/19  Yes Nicholas Lose, MD  atorvastatin (LIPITOR) 20 MG tablet Take 20 mg by mouth daily. 11/27/18  Yes [provider]  benzonatate (TESSALON) 100 MG capsule Take 1 capsule (100 mg total) by mouth 3 (three) times daily. 01/16/19 02/15/19 Yes Dahal, Marlowe Aschoff, MD  Biotin 10000 MCG TABS Take 10,000 mcg by mouth daily.    Yes [provider]  bumetanide (BUMEX) 1 MG tablet TAKE 1 TABLET(1 MG) BY MOUTH TWICE DAILY Patient taking differently: Take 1 mg by mouth 2 (two) times daily.  01/20/19  Yes Nicholas Lose, MD  cetirizine (ZYRTEC) 10 MG tablet Take 10 mg by mouth daily.   Yes [provider]  cloNIDine (CATAPRES) 0.3 MG tablet Take 0.3 mg by mouth at bedtime.    Yes [provider]  cyclobenzaprine (FLEXERIL) 10 MG tablet Take 10 mg by mouth every 8 (eight) hours as needed for muscle spasms. 07/16/17  Yes [provider]  hydrOXYzine (ATARAX/VISTARIL) 10 MG tablet Take 10-30 mg by mouth at bedtime as needed for itching.  10/07/15  Yes [provider]  hyoscyamine (LEVSIN SL) 0.125 MG SL tablet dissolve 1 tablet under the tongue every 4 hours if needed Patient taking differently: Take 0.125 mg by mouth every 4 (four) hours as needed for cramping.  10/02/16  Yes Nandigam, Venia Minks, MD  letrozole (FEMARA) 2.5 MG tablet Take 1 tablet (2.5 mg total) by mouth daily. 01/15/19  Yes Nicholas Lose, MD  LORazepam (ATIVAN) 0.5 MG tablet Take 1 tablet daily, as needed for nausea. Patient taking differently: Take 0.5 mg by mouth at bedtime as needed. Take 1 tablet daily, as needed for nausea. 12/06/18  Yes Nicholas Lose, MD  Multiple Vitamin (MULITIVITAMIN WITH MINERALS) TABS Take 1 tablet by  mouth daily.   Yes [provider]  oxyCODONE-acetaminophen (PERCOCET) 10-325 MG tablet Take 1 tablet by mouth every 4 (four) hours as needed for pain. 01/24/19  Yes Nicholas Lose, MD  palbociclib Leslee Home) 125 MG capsule Take 1 capsule (125 mg total) by mouth daily with breakfast. Take whole with food. Take for 21 days on, 7 days off, repeat every 28 days. 01/24/19  Yes Nicholas Lose, MD  pantoprazole (PROTONIX) 40 MG tablet Take 1 tablet (40 mg total) by mouth 2 (two) times daily before a meal. Patient taking differently: Take 40 mg by mouth daily.  06/28/16  Yes Nandigam, Venia Minks, MD  senna (SENOKOT) 8.6 MG TABS tablet Take 1 tablet (8.6 mg total) by mouth 2 (two) times  daily. 01/16/19 02/15/19 Yes Dahal, Marlowe Aschoff, MD  traZODone (DESYREL) 100 MG tablet Take 150 mg by mouth at bedtime.  10/17/12  Yes [provider]    Physical Exam: Vitals:   02/03/19 0500 02/03/19 0530 02/03/19 0630 02/03/19 1019  BP: 98/61 (!) 82/59 98/66 92/62   Pulse: 88 73 78   Resp: 17 (!) 21 (!) 21   Temp:      TempSrc:      SpO2: 98% 98% 96%   Weight:      Height:        Constitutional: NAD, calm, comfortable Vitals:   02/03/19 0500 02/03/19 0530 02/03/19 0630 02/03/19 1019  BP: 98/61 (!) 82/59 98/66 92/62   Pulse: 88 73 78   Resp: 17 (!) 21 (!) 21   Temp:      TempSrc:      SpO2: 98% 98% 96%   Weight:      Height:       Eyes: lids and conjunctivae normal ENMT: Mucous membranes are moist.  Neck: normal, supple Respiratory: Diminished to auscultation bilaterally. Normal respiratory effort. No accessory muscle use.  Currently on room air. Cardiovascular: Regular rate and rhythm, no murmurs. No extremity edema. Abdomen: no tenderness, no distention. Bowel sounds positive.  Musculoskeletal:  No joint deformity upper and lower extremities.   Skin: no rashes, lesions, ulcers.  Psychiatric: Normal judgment and insight. Alert and oriented x 3. Normal mood.   Labs on Admission: I have  personally reviewed following labs and imaging studies  CBC: Recent Labs  Lab 02/03/19 0411  WBC 1.2*  NEUTROABS 0.6*  HGB 9.8*  HCT 28.9*  MCV 82.8  PLT 16*   Basic Metabolic Panel: Recent Labs  Lab 02/03/19 0411  NA 137  K 3.1*  CL 98  CO2 28  GLUCOSE 137*  BUN 14  CREATININE 0.73  CALCIUM 8.5*   GFR: Estimated Creatinine Clearance: 59.8 mL/min (by C-G formula based on SCr of 0.73 mg/dL). Liver Function Tests: Recent Labs  Lab 02/03/19 0411  AST 82*  ALT 52*  ALKPHOS 381*  BILITOT 1.0  PROT 6.1*  ALBUMIN 3.2*   No results for input(s): LIPASE, AMYLASE in the last 168 hours. No results for input(s): AMMONIA in the last 168 hours. Coagulation Profile: Recent Labs  Lab 02/03/19 0411  INR 1.1   Cardiac Enzymes: No results for input(s): CKTOTAL, CKMB, CKMBINDEX, TROPONINI in the last 168 hours. BNP (last 3 results) No results for input(s): PROBNP in the last 8760 hours. HbA1C: No results for input(s): HGBA1C in the last 72 hours. CBG: No results for input(s): GLUCAP in the last 168 hours. Lipid Profile: No results for input(s): CHOL, HDL, LDLCALC, TRIG, CHOLHDL, LDLDIRECT in the last 72 hours. Thyroid Function Tests: No results for input(s): TSH, T4TOTAL, FREET4, T3FREE, THYROIDAB in the last 72 hours. Anemia Panel: No results for input(s): VITAMINB12, FOLATE, FERRITIN, TIBC, IRON, RETICCTPCT in the last 72 hours. Urine analysis:    Component Value Date/Time   COLORURINE YELLOW 11/19/2016 [place] 11/19/2016 1604   LABSPEC 1.018 11/19/2016 1604   PHURINE 6.0 11/19/2016 1604   GLUCOSEU NEGATIVE 11/19/2016 1604   HGBUR NEGATIVE 11/19/2016 1604   BILIRUBINUR NEGATIVE 11/19/2016 1604   KETONESUR NEGATIVE 11/19/2016 1604   PROTEINUR NEGATIVE 11/19/2016 1604   UROBILINOGEN 0.2 12/28/2013 1030   NITRITE NEGATIVE 11/19/2016 1604   LEUKOCYTESUR NEGATIVE 11/19/2016 1604    Radiological Exams on Admission: Dg Chest 1 View  Result  Date: 02/03/2019 CLINICAL DATA:  Status post thoracentesis. History of breast carcinoma EXAM: CHEST  1 VIEW COMPARISON:  February 03, 2019 chest radiograph and CT angiogram chest February 03, 2019; PET-CT January 09, 2019 FINDINGS: No evident pneumothorax. No appreciable residual pleural effusion on the right. There is a persistent small pleural effusion on the left. There remains reticular opacity throughout the lungs, concerning for lymphangitic spread of tumor based on prior CT appearance. There is ill-defined hazy opacity in the right lower lobe which may represent a degree of re-expansion pulmonary edema. There is also atelectatic change in the lung bases. Heart is upper normal in size with pulmonary vascularity normal. No appreciable adenopathy evident by radiography. No bone lesions. IMPRESSION: No pneumothorax. Persistent small left pleural effusion. Diffuse reticular opacity concerning for lymphangitic spread of tumor. Stable atelectasis left base. Ill-defined hazy opacity in the right mid lower lung zones may represent a degree of re-expansion pulmonary edema given the recent thoracentesis. Stable cardiac silhouette. Electronically Signed   By: Lowella Grip III M.D.   On: 02/03/2019 10:49   Ct Angio Chest Pe W/cm &/or Wo Cm  Result Date: 02/03/2019 CLINICAL DATA:  Breast cancer.  Recent thoracentesis. EXAM: CT ANGIOGRAPHY CHEST WITH CONTRAST TECHNIQUE: Multidetector CT imaging of the chest was performed using the standard protocol during bolus administration of intravenous contrast. Multiplanar CT image reconstructions and MIPs were obtained to evaluate the vascular anatomy. CONTRAST:  169mL OMNIPAQUE IOHEXOL 350 MG/ML SOLN COMPARISON:  PET CT from 01/09/2019 FINDINGS: Cardiovascular: Normal heart size. No pericardial effusion. Normal aorta. No pulmonary artery filling defects. Mediastinum/Nodes: Negative for adenopathy in the chest Lungs/Pleura: Reticulonodular opacities in the upper lungs with  chronic consolidation in the right middle lobe, likely lymphangitic tumor based on previous staging. Large right and moderate left pleural effusion which is layering without visible parietal pleural nodularity. No acute airspace disease. Upper Abdomen: Negative in the arterial phase Musculoskeletal: Osseous metastatic disease which is widespread. No acute osseous finding. Review of the MIP images confirms the above findings. IMPRESSION: 1. Large right and moderate left pleural effusion which have progressed from PET 01/09/2019. The fluid is dependent/layering. 2. Negative for pulmonary embolism. 3. Extensive metastatic disease as recently staged. Electronically Signed   By: Monte Fantasia M.D.   On: 02/03/2019 06:01   Dg Chest Port 1 View  Result Date: 02/03/2019 CLINICAL DATA:  Right chest pain.  Thoracentesis EXAM: PORTABLE CHEST 1 VIEW COMPARISON:  Ten days ago FINDINGS: Normal size for technique. Reticulonodular density on both sides with small pleural effusions, mildly increased on the right. No pneumothorax. Bony metastatic disease by CT. IMPRESSION: 1. Small bilateral pleural effusion with probable mild increase on the right since study 10 days ago. There may also be right lower lobe atelectasis since prior. 2. 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 Electronically Signed   By: Monte Fantasia M.D.   On: 02/03/2019 04:53   US Thoracentesis Asp Pleural Space W/img Guide  Result Date: 02/03/2019 INDICATION: Pleural effusion EXAM: ULTRASOUND GUIDED RIGHT THORACENTESIS MEDICATIONS: None. COMPLICATIONS: None immediate. PROCEDURE: An ultrasound guided thoracentesis was thoroughly discussed with the patient and questions answered. The benefits, risks, alternatives and complications were also discussed. The patient understands and wishes to proceed with the procedure. Written consent was obtained. Ultrasound was performed to localize and mark  an adequate pocket of fluid in the right posterior chest. The area was then prepped and draped in the normal sterile fashion. 1% Lidocaine was used for local anesthesia. Under ultrasound guidance  a 19 gauge, 7-cm, Yueh catheter was introduced. Thoracentesis was performed. The catheter was removed and a dressing applied. FINDINGS: A total of approximately 600 mL of clear yellow fluid was removed. Samples were sent to the laboratory as requested by the clinical team. IMPRESSION: Successful ultrasound guided right thoracentesis yielding 600 mL of pleural fluid. Electronically Signed   By: Rolm Baptise M.D.   On: 02/03/2019 10:45    EKG: Independently reviewed. SR 89bpm.  Assessment/Plan Active Problems:   Pleural effusion    Bilateral pleural effusion in the setting of breast cancer with metastasis -Thoracentesis with platelet transfusion performed with 600 mL of pleural fluid removed on right side -Continue close monitoring -No PE noted on CTA of chest -Palliative evaluation per oncology recommendations -Stop Ibrance and letrozole for now -Hold Bumex for now until blood pressures more stable -Continue pain medications for chest pain  Pancytopenia  -We will plan to transfuse platelets -Appreciate oncology evaluation and further monitoring at [facility] long with smear ordered and pending -Started on Neupogen due to [place] of 600 and will likely need this daily until [facility] is greater than 1000 -Repeat CBC with differential in a.m.  Hypokalemia -Replete and reevaluate labs in a.m. along with magnesium  Dyslipidemia -Continue statin  GERD -Continue PPI  Anxiety -Xanax as needed   DVT prophylaxis: SCDs Code Status: Full Family Communication: Husband, Grayland Ormond at bedside Disposition Plan: Admit for thoracentesis, platelet transfusion, and further oncology evaluation with smear pending Consults called: EDP discussed with oncology Dr. Jana Hakim, will consult palliative care as well Admission  status: Inpatient, MedSurg at [facility] long   [facility] Pager [phone]  If 7PM-7AM, please contact night-coverage [email or web address] Password [email or web address]  02/03/2019, 10:55 AM

## 2019-02-03 NOTE — ED Notes (Addendum)
Called AC for Tbo-filgrastrim injection

## 2019-02-03 NOTE — ED Notes (Signed)
Pt back from CT

## 2019-02-03 NOTE — Progress Notes (Signed)
Thoracentesis complete no signs of distress.  

## 2019-02-03 NOTE — ED Notes (Signed)
Patient transported to CT 

## 2019-02-03 NOTE — ED Notes (Signed)
Date and time results received: 02/03/19 0434 (use smartphrase ".now" to insert current time)  Test: WBC / Platelets Critical Value: WBC 1.2 / Platelets 16  Name of Provider Notified: Tomi Bamberger  Orders Received? Or Actions Taken?:

## 2019-02-03 NOTE — ED Provider Notes (Signed)
[facility] EMERGENCY DEPARTMENT Provider Note   CSN: [id number] Arrival date & time: 02/03/19  0346   Time seen 4:07 AM  History   Chief Complaint Chief Complaint  Patient presents with   Abdominal Pain    HPI Jasmine Allen is a 53 y.o. female.     HPI patient is followed by Dr. Lindi Adie, oncology for breast cancer.  She was diagnosed in May 2019 and had a lumpectomy done.  She was T1CN0 stage Ia.  She had adjuvant chemotherapy with Adriamycin and Cytoxan followed by Taxol.  She also had adjuvant radiation therapy from December 2 through April 25, 2018.  She had a PET scan done on October 1 which showed pulmonary hypermetabolic lesions consistent with lymphangitic tumor spread and a confluent right middle lobe consolidation and hypermetabolic abdominal, cervical, and left axillary lymph nodes, diffuse liver hypermetabolic lesions some nonspecific.  She had diffuse bone marrow hypermetabolism worrisome for metastatic disease.  Patient had a bone marrow biopsy done on October 8 that was positive for metastatic breast cancer.  She also had a pleural effusion and had a thoracentesis done that was consistent with metastatic adenocarcinoma of the breast.  She was seen by Dr. Lindi Adie on October 16 and at that time he told her that fortunately there is no cure for her condition at this point, and they were going to do palliation care.  Patient states about 3 AM she was awakened from sleep with right anterior lower chest pain.  She states this pain is different from what she had before when she had the thoracentesis done.  She states the pain is sharp and achy.  She states movement makes it worse.  She initially told me it was not pleuritic however during exam she states it hurts to breathe deep.  She states she has a chronic cough but it seems worse the last few days.  She denies any fevers.  She states she is on 2 L nasal cannula oxygen and states without it her pulse ox is 80%.  She thinks her  pulse ox is 94% on the 2 L.  She denies any swelling or pain of her legs.  Patient did not take any of her pain medicine prior to coming to the ED.  PCP Aletha Halim., PA-C Oncology Dr Lindi Adie  Past Medical History:  Diagnosis Date   Alopecia, unspecified    Anemia, unspecified    during her pregnancy   Anxiety state, unspecified    Breast cancer ([place]) 2019   Right Breast Cancer   Bulging disc    Depressive disorder, not elsewhere classified    Diverticulosis of colon (without mention of hemorrhage)    CT Scan   Dysrhythmia    PVCs in the past   Esophageal reflux    Esophagitis 1998   Hiatal hernia 1998   History of kidney stones    Hypercholesteremia    Hypertension    Migraine    Opioid abuse, in remission [facility])     Patient Active Problem List   Diagnosis Date Noted   Abnormal CT scan, lung 01/13/2019   Pleural effusion 01/13/2019   Dyspnea 01/13/2019   Community acquired pneumonia 01/03/2019   Port-A-Cath in place 10/23/2017   Malignant neoplasm of lower-inner quadrant of right breast of female, estrogen receptor positive ([place]) 08/23/2017   Pain of anterior chest wall with respiration 04/07/2014   Smoking 04/07/2014   ANEMIA, IRON DEFICIENCY 10/20/2009   DEPRESSION 06/15/2009   GERD 06/15/2009  IRRITABLE BOWEL SYNDROME 06/15/2009   RECTAL BLEEDING 06/15/2009    Past Surgical History:  Procedure Laterality Date   ABLATION     BREAST LUMPECTOMY Right 08/16/2017   BREAST LUMPECTOMY WITH AXILLARY LYMPH NODE BIOPSY Right 08/16/2017   Procedure: RIGHT BREAST LUMPECTOMY WITH AXILLARY SENTINEL LYMPH NODE BIOPSY;  Surgeon: Rolm Bookbinder, MD;  Location: [place];  Service: General;  Laterality: Right;   CARDIAC CATHETERIZATION  2011   normal coronaries   KNEE SURGERY     PORTACATH PLACEMENT Right 09/11/2017   Procedure: INSERTION PORT-A-CATH WITH ULTRASOUND;  Surgeon: Rolm Bookbinder, MD;  Location: [place];   Service: General;  Laterality: Right;   RE-EXCISION OF BREAST LUMPECTOMY Right 09/11/2017   Procedure: RE-EXCISION OF BREAST LUMPECTOMY;  Surgeon: Rolm Bookbinder, MD;  Location: [place];  Service: General;  Laterality: Right;   TONSILLECTOMY       OB History   No obstetric history on file.      Home Medications    Prior to Admission medications   Medication Sig Start Date End Date Taking? Authorizing Provider  albuterol (PROVENTIL) (2.5 MG/3ML) 0.083% nebulizer solution Inhale 3 mLs into the lungs every 6 (six) hours as needed for wheezing or shortness of breath (cough). 01/16/19 02/15/19  Terrilee Croak, MD  ALPRAZolam Duanne Moron) 0.5 MG tablet Take 1 tablet (0.5 mg total) by mouth at bedtime as needed for anxiety or sleep. 01/24/19   Nicholas Lose, MD  atorvastatin (LIPITOR) 20 MG tablet Take 20 mg by mouth daily. 11/27/18   [provider]  benzonatate (TESSALON) 100 MG capsule Take 1 capsule (100 mg total) by mouth 3 (three) times daily. 01/16/19 02/15/19  Terrilee Croak, MD  Biotin 10000 MCG TABS Take 10,000 mcg by mouth daily.     [provider]  bumetanide (BUMEX) 1 MG tablet TAKE 1 TABLET(1 MG) BY MOUTH TWICE DAILY 01/20/19   Nicholas Lose, MD  cetirizine (ZYRTEC) 10 MG tablet Take 10 mg by mouth daily.    [provider]  cloNIDine (CATAPRES) 0.3 MG tablet Take 0.3 mg by mouth at bedtime.     [provider]  cyclobenzaprine (FLEXERIL) 10 MG tablet Take 10 mg by mouth every 8 (eight) hours as needed for muscle spasms. 07/16/17   [provider]  hydrOXYzine (ATARAX/VISTARIL) 10 MG tablet Take 10-30 mg by mouth at bedtime as needed for itching.  10/07/15   [provider]  hyoscyamine (LEVSIN SL) 0.125 MG SL tablet dissolve 1 tablet under the tongue every 4 hours if needed Patient taking differently: Take 0.125 mg by mouth every 4 (four) hours as needed for cramping.  10/02/16   Mauri Pole, MD  ibuprofen  (ADVIL) 800 MG tablet Take 1 tablet (800 mg total) by mouth every 6 (six) hours as needed for moderate pain. 11/03/18   Orpah Greek, MD  letrozole [facility]) 2.5 MG tablet Take 1 tablet (2.5 mg total) by mouth daily. 01/15/19   Nicholas Lose, MD  LORazepam (ATIVAN) 0.5 MG tablet Take 1 tablet daily, as needed for nausea. 12/06/18   Nicholas Lose, MD  Multiple Vitamin (MULITIVITAMIN WITH MINERALS) TABS Take 1 tablet by mouth daily.    [provider]  oxyCODONE-acetaminophen (PERCOCET) 10-325 MG tablet Take 1 tablet by mouth every 4 (four) hours as needed for pain. 01/24/19   Nicholas Lose, MD  palbociclib Leslee Home) 125 MG capsule Take 1 capsule (125 mg total) by mouth daily with breakfast. Take whole with food. Take  for 21 days on, 7 days off, repeat every 28 days. 01/24/19   Nicholas Lose, MD  pantoprazole (PROTONIX) 40 MG tablet Take 1 tablet (40 mg total) by mouth 2 (two) times daily before a meal. Patient taking differently: Take 40 mg by mouth daily.  06/28/16   Mauri Pole, MD  senna (SENOKOT) 8.6 MG TABS tablet Take 1 tablet (8.6 mg total) by mouth 2 (two) times daily. 01/16/19 02/15/19  Terrilee Croak, MD  traZODone (DESYREL) 100 MG tablet Take 100 mg by mouth at bedtime.  10/17/12   [provider]    Family History Family History  Problem Relation Age of Onset   Heart disease Father        CABG   Breast cancer Mother    Breast cancer Maternal Grandmother    Breast cancer Cousin    Colon cancer Neg Hx    Stomach cancer Neg Hx     Social History Social History   Tobacco Use   Smoking status: Former Smoker    Packs/day: 0.25    Quit date: 08/25/2016    Years since quitting: 2.4   Smokeless tobacco: Never Used  Substance Use Topics   Alcohol use: Yes    Comment: "once a week"   Drug use: No     Allergies   Amoxicillin-pot clavulanate, Erythromycin, Metoclopramide hcl, and Sulfonamide derivatives   Review of Systems Review of  Systems  All other systems reviewed and are negative.    Physical Exam Updated Vital Signs BP 103/69 (BP Location: Left Arm)    Pulse 93    Temp 98.6 F (37 C) (Oral)    Resp 20    Ht 5' (1.524 m)    Wt 50.8 kg    LMP 12/24/2010    SpO2 96%    BMI 21.87 kg/m   Laboratory interpretation all normal    Physical Exam Vitals signs and nursing note reviewed.  Constitutional:      General: She is in acute distress.     Appearance: Normal appearance.     Comments: Patient is very dramatic and is holding her right arm across her right lower chest area.  HENT:     Head: Normocephalic and atraumatic.     Right Ear: External ear normal.     Left Ear: External ear normal.     Nose: Nose normal.     Mouth/Throat:     Mouth: Mucous membranes are moist.     Pharynx: No oropharyngeal exudate or posterior oropharyngeal erythema.  Eyes:     Extraocular Movements: Extraocular movements intact.     Conjunctiva/sclera: Conjunctivae normal.     Pupils: Pupils are equal, round, and reactive to light.  Neck:     Musculoskeletal: Normal range of motion.  Cardiovascular:     Rate and Rhythm: Normal rate and regular rhythm.     Pulses: Normal pulses.  Pulmonary:     Effort: Respiratory distress present.     Comments: Patient has diminished breath sounds at the bases, she has E to A changes at the bases but no increased tactile fremitus consistent with bilateral lower pleural effusions. Chest:     Chest wall: Tenderness present.    Abdominal:     General: Abdomen is flat.     Palpations: Abdomen is soft.     Tenderness: There is no abdominal tenderness.  Musculoskeletal: Normal range of motion.        General: No swelling or tenderness.  Right lower leg: No edema.     Left lower leg: No edema.  Skin:    General: Skin is warm and dry.     Findings: No rash.  Neurological:     General: No focal deficit present.     Mental Status: She is alert and oriented to person, place, and time.      Cranial Nerves: No cranial nerve deficit.  Psychiatric:        Mood and Affect: Mood normal.        Behavior: Behavior normal.        Thought Content: Thought content normal.      ED Treatments / Results  Labs (all labs ordered are listed, but only abnormal results are displayed) Results for orders placed or performed during the hospital encounter of 02/03/19  Comprehensive metabolic panel  Result Value Ref Range   Sodium 137 135 - 145 mmol/L   Potassium 3.1 (L) 3.5 - 5.1 mmol/L   Chloride 98 98 - 111 mmol/L   CO2 28 22 - 32 mmol/L   Glucose, Bld 137 (H) 70 - 99 mg/dL   BUN 14 6 - 20 mg/dL   Creatinine, Ser 0.73 0.44 - 1.00 mg/dL   Calcium 8.5 (L) 8.9 - 10.3 mg/dL   Total Protein 6.1 (L) 6.5 - 8.1 g/dL   Albumin 3.2 (L) 3.5 - 5.0 g/dL   AST 82 (H) 15 - 41 U/L   ALT 52 (H) 0 - 44 U/L   Alkaline Phosphatase 381 (H) 38 - 126 U/L   Total Bilirubin 1.0 0.3 - 1.2 mg/dL   GFR calc non Af Amer >60 >60 mL/min   GFR calc Af Amer >60 >60 mL/min   Anion gap 11 5 - 15  CBC with Differential  Result Value Ref Range   WBC 1.2 (LL) 4.0 - 10.5 K/uL   RBC 3.49 (L) 3.87 - 5.11 MIL/uL   Hemoglobin 9.8 (L) 12.0 - 15.0 g/dL   HCT 28.9 (L) 36.0 - 46.0 %   MCV 82.8 80.0 - 100.0 fL   MCH 28.1 26.0 - 34.0 pg   MCHC 33.9 30.0 - 36.0 g/dL   RDW 13.7 11.5 - 15.5 %   Platelets 16 (LL) 150 - 400 K/uL   nRBC 0.0 0.0 - 0.2 %   Neutrophils Relative % 50 %   Neutro Abs 0.6 (L) 1.7 - 7.7 K/uL   Lymphocytes Relative 45 %   Lymphs Abs 0.6 (L) 0.7 - 4.0 K/uL   Monocytes Relative 3 %   Monocytes Absolute 0.0 (L) 0.1 - 1.0 K/uL   Eosinophils Relative 0 %   Eosinophils Absolute 0.0 0.0 - 0.5 K/uL   Basophils Relative 0 %   Basophils Absolute 0.0 0.0 - 0.1 K/uL   Immature Granulocytes 2 %   Abs Immature Granulocytes 0.02 0.00 - 0.07 K/uL  D-dimer, quantitative  Result Value Ref Range   D-Dimer, Quant 3.40 (H) 0.00 - 0.50 ug/mL-FEU  Protime-INR  Result Value Ref Range   Prothrombin Time 14.5 11.4 -  15.2 seconds   INR 1.1 0.8 - 1.2  APTT  Result Value Ref Range   aPTT 32 24 - 36 seconds   Laboratory interpretation all normal except elevated D-dimer, low total white blood cell count with neutropenia of 600, worsening thrombocytopenia that had been in the 70-90,000 range up to 3 weeks ago.  Hypokalemia, elevation of LFTs, malnutrition    EKG None   ED ECG REPORT   Date: 02/03/2019  Rate: 89  Rhythm: normal sinus rhythm  QRS Axis: normal  Intervals: PR shortened  ST/T Wave abnormalities: nonspecific T wave changes  Conduction Disutrbances:none  Narrative Interpretation:   Old EKG Reviewed: none available  I have personally reviewed the EKG tracing and agree with the computerized printout as noted.   Radiology Ct Angio Chest Pe W/cm &/or Wo Cm  Result Date: 02/03/2019 CLINICAL DATA:  Breast cancer.  Recent thoracentesis. EXAM: CT ANGIOGRAPHY CHEST WITH CONTRAST TECHNIQUE: Multidetector CT imaging of the chest was performed using the standard protocol during bolus administration of intravenous contrast. Multiplanar CT image reconstructions and MIPs were obtained to evaluate the vascular anatomy. CONTRAST:  175m OMNIPAQUE IOHEXOL 350 MG/ML SOLN COMPARISON:  PET CT from 01/09/2019 FINDINGS: Cardiovascular: Normal heart size. No pericardial effusion. Normal aorta. No pulmonary artery filling defects. Mediastinum/Nodes: Negative for adenopathy in the chest Lungs/Pleura: Reticulonodular opacities in the upper lungs with chronic consolidation in the right middle lobe, likely lymphangitic tumor based on previous staging. Large right and moderate left pleural effusion which is layering without visible parietal pleural nodularity. No acute airspace disease. Upper Abdomen: Negative in the arterial phase Musculoskeletal: Osseous metastatic disease which is widespread. No acute osseous finding. Review of the MIP images confirms the above findings. IMPRESSION: 1. Large right and moderate left  pleural effusion which have progressed from PET 01/09/2019. The fluid is dependent/layering. 2. Negative for pulmonary embolism. 3. Extensive metastatic disease as recently staged. Electronically Signed   By: JMonte FantasiaM.D.   On: 02/03/2019 06:01   Dg Chest Port 1 View  Result Date: 02/03/2019 CLINICAL DATA:  Right chest pain.  Thoracentesis EXAM: PORTABLE CHEST 1 VIEW COMPARISON:  Ten days ago FINDINGS: Normal size for technique. Reticulonodular density on both sides with small pleural effusions, mildly increased on the right. No pneumothorax. Bony metastatic disease by CT. IMPRESSION: 1. Small bilateral pleural effusion with probable mild increase on the right since study 10 days ago. There may also be right lower lobe atelectasis since prior. 2. 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 Electronically Signed   By: JMonte FantasiaM.D.   On: 02/03/2019 04:53    Procedures Procedures (including critical care time)  Medications Ordered in ED Medications  morphine 4 MG/ML injection 4 mg (4 mg Intravenous Given 02/03/19 0423)  ondansetron (ZOFRAN) injection 4 mg (4 mg Intravenous Given 02/03/19 0422)  iohexol (OMNIPAQUE) 350 MG/ML injection 100 mL (100 mLs Intravenous Contrast Given 02/03/19 0548)     Initial Impression / Assessment and Plan / ED Course  I have reviewed the triage vital signs and the nursing notes.  Pertinent labs & imaging results that were available during my care of the patient were reviewed by me and considered in my medical decision making (see chart for details).      Laboratory testing was done.  Patient was given IV morphine for pain.  Patient's absolute neutrophil count is 600.  She was placed on protective isolation.  5:00 AM I discussed the test results with patient and her husband.  They are agreeable to doing a CTA of her chest to see if she has a PE, recurrent pleural effusion whether from  her cancer or bleeding, and they also reports she has been on Ibrance for about 1 week.  She appears more comfortable now.  Review of her record from October 16 shows she had been started on letrozole 2.5 mg daily on October 7 and ILeslee Homewas started on October  16.  6:25 AM I discussed the patient with Dr. Jana Hakim, oncologist.  He wants the pathologist to review her blood smear to look for nucleated RBCs.  He states that will help delineate whether the pancytopenia is from bone marrow iinfiltration or from the [place].  He also wants to stop the [place].  He states no transfusions at this time since she is not bleeding, either PRC's or platelets.  He wants her started on Neupogen 300 mcg subcu daily until her absolute neutrophil count is over 1000.  He recommends supportive care and having palliative care consult.  6:30 AM I talked to the patient and her husband.  They are agreeable to admission, Covid testing was ordered.  We discussed being admitted to [facility] long since that is where all her physicians are located.  07:14 AM Dr Marlowe Sax, hospitalist, will admit.   Final Clinical Impressions(s) / ED Diagnoses   Final diagnoses:  Atypical chest pain  Pleural effusion, bilateral    Plan admission  Rolland Porter, MD, Barbette Or, MD 02/03/19 [phone]

## 2019-02-04 ENCOUNTER — Inpatient Hospital Stay: Payer: 59 | Admitting: Internal Medicine

## 2019-02-04 ENCOUNTER — Other Ambulatory Visit: Payer: Self-pay | Admitting: Hematology and Oncology

## 2019-02-04 ENCOUNTER — Telehealth: Payer: Self-pay | Admitting: Pharmacist

## 2019-02-04 DIAGNOSIS — J90 Pleural effusion, not elsewhere classified: Secondary | ICD-10-CM | POA: Diagnosis not present

## 2019-02-04 DIAGNOSIS — C50919 Malignant neoplasm of unspecified site of unspecified female breast: Secondary | ICD-10-CM

## 2019-02-04 LAB — MAGNESIUM: Magnesium: 2 mg/dL (ref 1.7–2.4)

## 2019-02-04 LAB — BPAM PLATELET PHERESIS
Blood Product Expiration Date: 202010262359
ISSUE DATE / TIME: 202010261050
Unit Type and Rh: 6200

## 2019-02-04 LAB — CBC WITH DIFFERENTIAL/PLATELET
Abs Immature Granulocytes: 0.13 10*3/uL — ABNORMAL HIGH (ref 0.00–0.07)
Basophils Absolute: 0 10*3/uL (ref 0.0–0.1)
Basophils Relative: 0 %
Eosinophils Absolute: 0 10*3/uL (ref 0.0–0.5)
Eosinophils Relative: 1 %
HCT: 25 % — ABNORMAL LOW (ref 36.0–46.0)
Hemoglobin: 8.3 g/dL — ABNORMAL LOW (ref 12.0–15.0)
Immature Granulocytes: 7 %
Lymphocytes Relative: 33 %
Lymphs Abs: 0.6 10*3/uL — ABNORMAL LOW (ref 0.7–4.0)
MCH: 28.2 pg (ref 26.0–34.0)
MCHC: 33.2 g/dL (ref 30.0–36.0)
MCV: 85 fL (ref 80.0–100.0)
Monocytes Absolute: 0.1 10*3/uL (ref 0.1–1.0)
Monocytes Relative: 5 %
Neutro Abs: 1 10*3/uL — ABNORMAL LOW (ref 1.7–7.7)
Neutrophils Relative %: 54 %
Platelets: 34 10*3/uL — ABNORMAL LOW (ref 150–400)
RBC: 2.94 MIL/uL — ABNORMAL LOW (ref 3.87–5.11)
RDW: 13.5 % (ref 11.5–15.5)
WBC: 1.8 10*3/uL — ABNORMAL LOW (ref 4.0–10.5)
nRBC: 1.1 % — ABNORMAL HIGH (ref 0.0–0.2)

## 2019-02-04 LAB — PREPARE PLATELET PHERESIS: Unit division: 0

## 2019-02-04 LAB — BASIC METABOLIC PANEL
Anion gap: 7 (ref 5–15)
BUN: 12 mg/dL (ref 6–20)
CO2: 29 mmol/L (ref 22–32)
Calcium: 8.7 mg/dL — ABNORMAL LOW (ref 8.9–10.3)
Chloride: 102 mmol/L (ref 98–111)
Creatinine, Ser: 0.68 mg/dL (ref 0.44–1.00)
GFR calc Af Amer: >60 mL/min (ref >60)
GFR calc non Af Amer: >60 mL/min (ref >60)
Glucose, Bld: 92 mg/dL (ref 70–99)
Potassium: 3.5 mmol/L (ref 3.5–5.1)
Sodium: 138 mmol/L (ref 135–145)

## 2019-02-04 LAB — PATHOLOGIST SMEAR REVIEW

## 2019-02-04 MED ORDER — PRO-STAT SUGAR FREE PO LIQD
30.0000 mL | Freq: Two times a day (BID) | ORAL | Status: DC
  Administered 2019-02-04 – 2019-02-06 (×5): 30 mL via ORAL
  Filled 2019-02-04 (×5): qty 30

## 2019-02-04 MED ORDER — OXYCODONE-ACETAMINOPHEN 5-325 MG PO TABS: 2.0000 | ORAL_TABLET | ORAL | Status: DC | PRN

## 2019-02-04 MED ORDER — MORPHINE SULFATE ER 15 MG PO TBCR
15.0000 mg | EXTENDED_RELEASE_TABLET | Freq: Two times a day (BID) | ORAL | Status: DC
  Administered 2019-02-04 – 2019-02-06 (×5): 15 mg via ORAL
  Filled 2019-02-04 (×5): qty 1

## 2019-02-04 MED ORDER — PALBOCICLIB 75 MG PO TABS: 75.0000 mg | ORAL_TABLET | Freq: Every day | ORAL | 6 refills | Status: DC

## 2019-02-04 MED ORDER — SENNOSIDES-DOCUSATE SODIUM 8.6-50 MG PO TABS
1.0000 | ORAL_TABLET | Freq: Two times a day (BID) | ORAL | Status: DC
  Administered 2019-02-04 – 2019-02-06 (×5): 1 via ORAL
  Filled 2019-02-04 (×5): qty 1

## 2019-02-04 MED ORDER — MORPHINE SULFATE 15 MG PO TABS
15.0000 mg | ORAL_TABLET | ORAL | Status: DC | PRN
  Administered 2019-02-04: 30 mg via ORAL
  Administered 2019-02-05 (×2): 15 mg via ORAL
  Administered 2019-02-05 (×2): 30 mg via ORAL
  Administered 2019-02-06 (×2): 15 mg via ORAL
  Administered 2019-02-06: 30 mg via ORAL
  Filled 2019-02-04 (×3): qty 2
  Filled 2019-02-04: qty 1
  Filled 2019-02-04: qty 2
  Filled 2019-02-04: qty 1
  Filled 2019-02-04 (×2): qty 2

## 2019-02-04 MED ORDER — BUMETANIDE 0.5 MG PO TABS
0.5000 mg | ORAL_TABLET | Freq: Two times a day (BID) | ORAL | Status: DC
  Administered 2019-02-04: 0.5 mg via ORAL
  Filled 2019-02-04: qty 1

## 2019-02-04 MED ORDER — MORPHINE SULFATE (PF) 2 MG/ML IV SOLN
2.0000 mg | INTRAVENOUS | Status: DC | PRN
  Administered 2019-02-04: 2 mg via INTRAVENOUS
  Administered 2019-02-04: 4 mg via INTRAVENOUS
  Administered 2019-02-05 – 2019-02-06 (×2): 2 mg via INTRAVENOUS
  Filled 2019-02-04 (×3): qty 1
  Filled 2019-02-04: qty 2

## 2019-02-04 MED ORDER — ALBUTEROL SULFATE HFA 108 (90 BASE) MCG/ACT IN AERS: 1.0000 | INHALATION_SPRAY | Freq: Four times a day (QID) | RESPIRATORY_TRACT | Status: DC | PRN

## 2019-02-04 MED ORDER — HYDROCOD POLST-CPM POLST ER 10-8 MG/5ML PO SUER
5.0000 mL | Freq: Two times a day (BID) | ORAL | Status: DC | PRN
  Administered 2019-02-04 – 2019-02-05 (×3): 5 mL via ORAL
  Filled 2019-02-04 (×3): qty 5

## 2019-02-04 MED ORDER — POLYETHYLENE GLYCOL 3350 17 G PO PACK
17.0000 g | PACK | Freq: Every day | ORAL | Status: DC
  Administered 2019-02-04 – 2019-02-06 (×3): 17 g via ORAL
  Filled 2019-02-04 (×3): qty 1

## 2019-02-04 MED ORDER — ENSURE ENLIVE PO LIQD
237.0000 mL | Freq: Three times a day (TID) | ORAL | Status: DC
  Administered 2019-02-05 – 2019-02-06 (×2): 237 mL via ORAL

## 2019-02-04 MED ORDER — MORPHINE SULFATE 15 MG PO TABS
15.0000 mg | ORAL_TABLET | ORAL | Status: DC | PRN
  Administered 2019-02-04: 15 mg via ORAL
  Filled 2019-02-04: qty 1

## 2019-02-04 NOTE — Telephone Encounter (Signed)
Oral Oncology Pharmacist Encounter  Received notification from Dr. Geralyn Flash collaborative practice RN that patient is currently admitted and Dr. Lindi Adie would like to decrease her Ibrance (palbociclib) dose to 75 mg tablets, take 1 tablet by mouth once daily for 3 weeks on, 1 week off, repeat every 28 days.  Patient was provided samples from the office of 125 mg tablets on 01/24/2019 and started Ibrance on 01/25/2019. Original plan was to follow-up day 14 lab check originally scheduled for 02/07/2019 and send a new prescription to mandated dispensing pharmacy ([facility]) after lab check had resulted, and an effort to send the prescription for the correct dose.  New prescription for Ibrance (palbociclib) 75 mg tablets, take 1 tablet by mouth once daily for 21 days on, 7 days off, repeat every 28 days, quantity #21, refills = 6, has been E scribed to [facility] in [place], [place].  We will reach out to patient to provide information about dispensing pharmacy once we have confirmed with the pharmacy that they are processing her prescription.  Jasmine Allen, PharmD, BCPS, BCOP  02/04/2019 11:05 AM Oral Oncology Clinic [phone]

## 2019-02-04 NOTE — Progress Notes (Signed)
[facility] Progress Note  Patient: Jasmine Allen [id number]   PCP: Aletha Halim., PA-C DOB: [birth year]   DOA: 02/03/2019   DOS: 02/04/2019   Date of Service: the patient was seen and examined on 02/04/2019  Chief Complaint  Patient presents with  . Abdominal Pain   Brief hospital course: Jasmine Allen is a 53 y.o. female with medical history significant for breast cancer with metastasis, anxiety, dyslipidemia, and GERD who follows with Dr. Lindi Adie for chemotherapy.  She was last seen by him on 10/16 at which time she was told that there is no cure for her condition and that they would aim for palliative care.  She presented to the ED at 3 AM this morning with right anterior and lower chest pain.  She states that the pain is sharp and achy and is worse with movement.  There appears to be a pleuritic component with deep breath and coughing that seems to worsen it and she has had a dry cough for the last few days.  She normally wears 2 L nasal cannula oxygen and does not have any swelling in her lower extremities.  She denies any fevers or chills.  No abdominal pain, nausea or vomiting noted.  Currently further plan is continue pain control and follow-up on palliative care for further assistance for pain control.  Subjective: Continues to have severe pain.  Any movement makes the pain significantly worsened and tolerable.  No nausea no vomiting.  Continues to have cough.  Continues to have difficulty breathing secondary to pain as well.  No diarrhea.  Reports constipation.  Assessment and Plan: Bilateral pleural effusion in the setting of breast cancer with metastasis -Thoracentesis with platelet transfusion performed with 600 mL of pleural fluid removed on right side -Continue close monitoring -No PE noted on CTA of chest -Palliative evaluation per oncology recommendations -Stop Ibrance and letrozole for now -Hold Bumex for now until blood pressures more stable -Continue  pain medications for chest pain  Pancytopenia  -We will plan to transfuse platelets -Appreciate oncology evaluation and further monitoring at [facility] long with smear ordered and pending -Started on Neupogen due to [place] of 600, discussed with oncology currently holding it further. -Repeat CBC with differential in a.m.  Pain control secondary to cancer. We will add IV morphine and Percocet and monitor. Palliative care consulted for further assistance.  Hypokalemia -Replete and reevaluate labs in a.m. along with magnesium  Dyslipidemia -Continue statin  GERD -Continue PPI  Anxiety -Xanax as needed  Diet: Regular diet  DVT Prophylaxis: Subcutaneous Lovenox  Advance goals of care discussion: Full code  Family Communication: family was present at bedside, at the time of interview. The pt provided permission to discuss medical plan with the family. Opportunity was given to ask question and all questions were answered satisfactorily.   Disposition:  Discharge to home.  Consultants: Oncology, palliative care Procedures: Thoracentesis  Scheduled Meds: . sodium chloride   Intravenous Once  . atorvastatin  20 mg Oral QHS  . benzonatate  100 mg Oral TID  . feeding supplement (ENSURE ENLIVE)  237 mL Oral TID BM  . feeding supplement (PRO-STAT SUGAR FREE 64)  30 mL Oral BID  . loratadine  10 mg Oral Daily  . midodrine  5 mg Oral BID WC  . morphine  15 mg Oral Q12H  . multivitamin with minerals  1 tablet Oral Daily  . pantoprazole  40 mg Oral BID AC  . polyethylene glycol  17 g  Oral Daily  . senna-docusate  1 tablet Oral BID  . sodium chloride flush  3 mL Intravenous Q12H  . traZODone  150 mg Oral QHS   Continuous Infusions: . sodium chloride     PRN Meds: sodium chloride, acetaminophen **OR** acetaminophen, albuterol, ALPRAZolam, chlorpheniramine-HYDROcodone, cyclobenzaprine, hyoscyamine, morphine, morphine injection, ondansetron **OR** ondansetron (ZOFRAN) IV, sodium  chloride flush Antibiotics: Anti-infectives (From admission, onward)   None       Objective: Physical Exam: Vitals:   02/03/19 2200 02/04/19 0639 02/04/19 1442 02/04/19 2015  BP: 90/60 98/60 (!) 99/56 93/61  Pulse: 73 82 82 83  Resp: 17 17 18 16   Temp: 98.6 F (37 C) 98.7 F (37.1 C) 98.6 F (37 C) 98.9 F (37.2 C)  TempSrc: Oral Oral Oral Oral  SpO2: 100% 97% 96% 97%  Weight:      Height:        Intake/Output Summary (Last 24 hours) at 02/04/2019 2025 Last data filed at 02/04/2019 1444 Gross per 24 hour  Intake 355 ml  Output 600 ml  Net -245 ml   Filed Weights   02/03/19 0356 02/03/19 1643  Weight: 50.8 kg 52.5 kg   General: alert and oriented to time, place, and person. Appear in marked distress, affect anxious Eyes: PERRL, Conjunctiva normal ENT: Oral Mucosa Clear, moist  Neck: no JVD, no Abnormal Mass Or lumps Cardiovascular: S1 and S2 Present, no Murmur, peripheral pulses symmetrical Respiratory: increased respiratory effort, Bilateral Air entry equal and Decreased, no signs of accessory muscle use, bilateral  Crackles, no wheezes Abdomen: Bowel Sound present, Soft and no tenderness, no hernia Skin: no rashes  Extremities: no Pedal edema, no calf tenderness Neurologic: without any new focal findings Gait not checked due to patient safety concerns  Data Reviewed: I have personally reviewed and interpreted daily labs, tele strips, imagings as discussed above. I reviewed all nursing notes, pharmacy notes, vitals, pertinent old records I have discussed plan of care as described above with RN and patient/family.  CBC: Recent Labs  Lab 02/03/19 0411 02/04/19 0528  WBC 1.2* 1.8*  NEUTROABS 0.6* 1.0*  HGB 9.8* 8.3*  HCT 28.9* 25.0*  MCV 82.8 85.0  PLT 16* 34*   Basic Metabolic Panel: Recent Labs  Lab 02/03/19 0411 02/04/19 0528  NA 137 138  K 3.1* 3.5  CL 98 102  CO2 28 29  GLUCOSE 137* 92  BUN 14 12  CREATININE 0.73 0.68  CALCIUM 8.5* 8.7*   MG  --  2.0    Liver Function Tests: Recent Labs  Lab 02/03/19 0411  AST 82*  ALT 52*  ALKPHOS 381*  BILITOT 1.0  PROT 6.1*  ALBUMIN 3.2*   No results for input(s): LIPASE, AMYLASE in the last 168 hours. No results for input(s): AMMONIA in the last 168 hours. Coagulation Profile: Recent Labs  Lab 02/03/19 0411  INR 1.1   Cardiac Enzymes: No results for input(s): CKTOTAL, CKMB, CKMBINDEX, TROPONINI in the last 168 hours. BNP (last 3 results) No results for input(s): PROBNP in the last 8760 hours. CBG: No results for input(s): GLUCAP in the last 168 hours. Studies: No results found.   Time spent: 35 minutes  Author: Berle Mull, MD [facility] 02/04/2019 8:25 PM  To reach On-call, see care teams to locate the attending and reach out to them via [email or web address]. If 7PM-7AM, please contact night-coverage If you still have difficulty reaching the attending provider, please page the [facility] (Director on Call) for [facility] on amion for  assistance.

## 2019-02-04 NOTE — Progress Notes (Addendum)
HEMATOLOGY-ONCOLOGY PROGRESS NOTE  SUBJECTIVE: The patient was admitted secondary to uncontrolled right anterior chest pain.  Pain is sharp and worse with movement.  Labs on admission showed a total white blood cell count of 1.2 and an ANC of 600.  Platelets were low at 16,000.  Hemoglobin also low at 9.8.  CT angiogram the chest showed no PE, large right-sided pleural effusion as well as a moderate left pleural effusion.  On-call oncologist recommended Granix.  She underwent an ultrasound-guided right thoracentesis yielding 600 cc of pleural fluid.  Reports ongoing pain.  Denies fevers and chills.  Reports that her breathing is stable.  Denies nausea vomiting.  Denies bleeding.  Oncology History  Malignant neoplasm of lower-inner quadrant of right breast of female, estrogen receptor positive ([place])  08/16/2017 Initial Diagnosis   Right lumpectomy: 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  High risk   08/16/2017 Cancer Staging   Staging form: Breast, AJCC 8th Edition - Pathologic stage from 08/16/2017: Stage IA (pT1c, pN0, cM0, G2, ER+, PR+, HER2-, Oncotype DX score: 31) - Signed by Gardenia Phlegm, NP on 09/12/2018   09/25/2017 - 02/05/2018 Chemotherapy   Adjuvant chemotherapy with dose dense Adriamycin and Cytoxan x4 followed by Taxol weekly x12    03/11/2018 - 04/25/2018 Radiation Therapy   Adjuvant XRT   01/09/2019 PET scan   Pulmonary hypermetabolic and consistent with lymphangitic tumor spread, confluent right middle lobe consolidation, hypermetabolic abdominal, cervical and left axillary lymph nodes, diffuse liver hypermetabolic some nonspecific.  Diffuse bone marrow hypermetabolism worrisome for metastatic disease.  Prior pericardial effusion  resolved.   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  Shortness of breath improved following thoracentesis. Cardiovascular: Right anterior chest pain Gastrointestinal:  Denies nausea, heartburn or change in bowel habits Skin: Denies abnormal skin rashes Lymphatics: Denies new lymphadenopathy or easy bruising Neurological:Denies numbness, tingling or new weaknesses Behavioral/Psych: Mood is stable, no new changes  Extremities: No lower extremity edema All other systems were reviewed with the patient and are negative.  I have reviewed the past medical history, past surgical history, social history and family history with the patient and they are unchanged from previous note.   PHYSICAL EXAMINATION: ECOG PERFORMANCE STATUS: 1 - Symptomatic but completely ambulatory  Vitals:   02/03/19 2200 02/04/19 0639  BP: 90/60 98/60  Pulse: 73 82  Resp: 17 17  Temp: 98.6 F (37 C) 98.7 F (37.1 C)  SpO2: 100% 97%   Filed Weights   02/03/19 0356 02/03/19 1643  Weight: 112 lb (50.8 kg) 115 lb 11.9 oz (52.5 kg)    Intake/Output from previous day: 10/26 0701 - 10/27 0700 In: 355 [P.O.:355] Out: -   GENERAL:alert, no distress and comfortable SKIN: skin color, texture, turgor are normal, no rashes or significant lesions EYES: normal, Conjunctiva are pink and non-injected, sclera clear OROPHARYNX:no exudate, no erythema and lips, buccal  mucosa, and tongue normal  NECK: supple, thyroid normal size, non-tender, without nodularity LYMPH:  no palpable lymphadenopathy in the  cervical, axillary or inguinal LUNGS: Diminished breath sounds left base HEART: regular rate & rhythm and no murmurs and no lower extremity edema ABDOMEN:abdomen soft, non-tender and normal bowel sounds Musculoskeletal:no cyanosis of digits and no clubbing  NEURO: alert & oriented x 3 with fluent speech, no focal motor/sensory deficits  LABORATORY DATA:  I have reviewed the data as listed CMP Latest Ref Rng & Units 02/04/2019 02/03/2019 01/24/2019  Glucose 70 - 99 mg/dL 92 137(H) 142(H)  BUN 6 - 20 mg/dL 12 14 14   Creatinine 0.44 - 1.00 mg/dL 0.68 0.73 0.82  Sodium 135 - 145 mmol/L 138 137 136  Potassium 3.5 - 5.1 mmol/L 3.5 3.1(L) 3.6  Chloride 98 - 111 mmol/L 102 98 93(L)  CO2 22 - 32 mmol/L 29 28 30   Calcium 8.9 - 10.3 mg/dL 8.7(L) 8.5(L) 9.1  Total Protein 6.5 - 8.1 g/dL - 6.1(L) 6.5  Total Bilirubin 0.3 - 1.2 mg/dL - 1.0 0.5  Alkaline Phos 38 - 126 U/L - 381(H) 735(H)  AST 15 - 41 U/L - 82(H) 87(H)  ALT 0 - 44 U/L - 52(H) 60(H)    Lab Results  Component Value Date   WBC 1.8 (L) 02/04/2019   HGB 8.3 (L) 02/04/2019   HCT 25.0 (L) 02/04/2019   MCV 85.0 02/04/2019   PLT 34 (L) 02/04/2019   NEUTROABS 1.0 (L) 02/04/2019    Dg Chest 1 View  Result Date: 02/03/2019 CLINICAL DATA:  Status post thoracentesis. History of breast carcinoma EXAM: CHEST  1 VIEW COMPARISON:  February 03, 2019 chest radiograph and CT angiogram chest February 03, 2019; PET-CT January 09, 2019 FINDINGS: No evident pneumothorax. No appreciable residual pleural effusion on the right. There is a persistent small pleural effusion on the left. There remains reticular opacity throughout the lungs, concerning for lymphangitic spread of tumor based on prior CT appearance. There is ill-defined hazy opacity in the right lower lobe which may represent a degree of re-expansion pulmonary edema. There is also atelectatic change in the lung bases. Heart is upper normal in size with pulmonary vascularity normal. No appreciable  adenopathy evident by radiography. No bone lesions. IMPRESSION: No pneumothorax. Persistent small left pleural effusion. Diffuse reticular opacity concerning for lymphangitic spread of tumor. Stable atelectasis left base. Ill-defined hazy opacity in the right mid lower lung zones may represent a degree of re-expansion pulmonary edema given the recent thoracentesis. Stable cardiac silhouette. Electronically Signed   By: Lowella Grip III M.D.   On: 02/03/2019 10:49   Dg Chest 1 View  Result Date: 01/13/2019 CLINICAL DATA:  Post thoracentesis on the right. History of hypertension. EXAM: CHEST  1 VIEW COMPARISON:  Radiographs earlier today.  PET-CT 01/09/2019. FINDINGS: 1720 hours. The right pleural effusion has decreased in volume. There is no evidence of pneumothorax. Diffuse bilateral interstitial pulmonary opacities are stable. There is improved aeration of the right lung base. A small left pleural effusion remains. IMPRESSION: Decreased right pleural effusion and no evidence of pneumothorax following thoracentesis. Electronically Signed   By: Richardean Sale M.D.   On: 01/13/2019 18:10   Dg Chest 2 View  Result Date: 01/24/2019 CLINICAL DATA:  Shortness of breath. Pleural effusion. EXAM: CHEST - 2 VIEW COMPARISON:  Chest x-rays dated 01/13/2019 and PET-CT dated 01/09/2019 FINDINGS: There is progression of the infiltrate in the right middle lobe. There is persistent diffuse accentuation of the  interstitial markings bilaterally. Heart size and vascularity are normal. Minimal right effusion, unchanged. Small left effusion, slightly more prominent. No acute bone abnormality. Thoracolumbar scoliosis. IMPRESSION: 1. Progressive infiltrate in the right middle lobe. 2. Slight increase in small left pleural effusion. 3. No change in the small right pleural effusion. 4. Persistent diffuse accentuation of the interstitial markings. Electronically Signed   By: Lorriane Shire M.D.   On: 01/24/2019 11:33   Dg  Chest 2 View  Result Date: 01/13/2019 CLINICAL DATA:  Worsening shortness of breath over the last month. Generalized abdominal pain. History of metastatic breast cancer. EXAM: CHEST - 2 VIEW COMPARISON:  PET-CT 01/09/2019, chest CT 01/06/2019 and chest radiographs 12/30/2018 and 12/16/2018. FINDINGS: The heart size and mediastinal contours are stable. There are small bilateral pleural effusions which appear unchanged from the recent CT and PET-CT. Coarse interstitial markings are again noted throughout both lungs with increased focal airspace disease at the right lung base, primarily in the middle lobe. There is a thoracolumbar scoliosis. IMPRESSION: Progressive coarsening of the interstitial markings and right middle lobe airspace disease compared with prior chest radiographs, but without gross change from the most recent PET-CT of 4 days ago. Again, findings may reflect lymphangitic spread of tumor with superimposed inflammation on the right. Electronically Signed   By: Richardean Sale M.D.   On: 01/13/2019 16:27   Ct Chest Wo Contrast  Result Date: 01/06/2019 CLINICAL DATA:  Shortness of breath, recent pneumonia with persistent cough and chest tightness. EXAM: CT CHEST WITHOUT CONTRAST TECHNIQUE: Multidetector CT imaging of the chest was performed following the standard protocol without IV contrast. COMPARISON:  December 09, 2018 FINDINGS: Cardiovascular: Aortic caliber is normal. Central pulmonary arteries are unremarkable on noncontrast scan. There is nodularity along the pericardium most pronounced along the left pericardial border. He has Mediastinum/Nodes: No signs of mediastinal lymphadenopathy. No internal mammary lymphadenopathy. No signs of hilar adenopathy though assessment of the bilateral hila limited secondary to lack of contrast. Lungs/Pleura: Diffuse interstitial thickening bilaterally with nodular features particularly along the major fissures. Signs of bilateral pleural effusions, small right  greater than left. Area of consolidation in the right chest anteriorly measuring approximately 3.3 x 2.0 cm. Patchy ground-glass opacities in the upper lobes bilaterally as well slightly worse than on the previous examination. Upper Abdomen: Subtle hypodensity in the anterior right hepatic lobe measuring approximately 1 cm may have been present on previous exams. Musculoskeletal: Subtle areas of sclerosis again noted throughout the spine. No destructive bone process. Scattered tiny areas of sclerosis also noted within the ribs. IMPRESSION: 1. 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 2. Background patchy opacities of uncertain significance area of more profound consolidation over the right anterior chest could represent post treatment/radiation change. Other areas may represent a background of pneumonitis. Correlate with any drug therapy that would predispose this patient to pneumonitis. 3. Subtle hypodensity in the anterior right hepatic lobe is of uncertain significance potentially correlating with new site of disease, also similarly in the anterior left hepatic lobe on image 107 and 139. 4. Multifocal tiny areas of sclerosis remain concerning given other findings, attention on follow-up. 5. These results will be called to the ordering clinician or representative by the Radiologist Assistant, and communication documented in the PACS or zVision Dashboard. Electronically Signed   By: Zetta Bills M.D.   On: 01/06/2019 10:29   Ct Angio Chest Pe W/cm &/or Wo Cm  Result Date: 02/03/2019 CLINICAL DATA:  Breast cancer.  Recent thoracentesis. EXAM: CT ANGIOGRAPHY CHEST WITH CONTRAST TECHNIQUE: Multidetector CT imaging of the chest was performed using the standard protocol during bolus administration of intravenous contrast. Multiplanar CT image reconstructions and MIPs were obtained to evaluate the vascular anatomy. CONTRAST:  172m  OMNIPAQUE IOHEXOL 350 MG/ML SOLN COMPARISON:  PET CT from 01/09/2019 FINDINGS: Cardiovascular: Normal heart size. No pericardial effusion. Normal aorta. No pulmonary artery filling defects. Mediastinum/Nodes: Negative for adenopathy in the chest Lungs/Pleura: Reticulonodular opacities in the upper lungs with chronic consolidation in the right middle lobe, likely lymphangitic tumor based on previous staging. Large right and moderate left pleural effusion which is layering without visible parietal pleural nodularity. No acute airspace disease. Upper Abdomen: Negative in the arterial phase Musculoskeletal: Osseous metastatic disease which is widespread. No acute osseous finding. Review of the MIP images confirms the above findings. IMPRESSION: 1. Large right and moderate left pleural effusion which have progressed from PET 01/09/2019. The fluid is dependent/layering. 2. Negative for pulmonary embolism. 3. Extensive metastatic disease as recently staged. Electronically Signed   By: JMonte FantasiaM.D.   On: 02/03/2019 06:01   Nm Pet Image Initial (pi) Skull Base To Thigh  Result Date: 01/10/2019 CLINICAL DATA:  Initial treatment strategy for breast cancer. Pericardial and pleural effusions suspicious for metastasis. EXAM: NUCLEAR MEDICINE PET SKULL BASE TO THIGH TECHNIQUE: 6.1 mCi F-18 FDG was injected intravenously. Full-ring PET imaging was performed from the skull base to thigh after the radiotracer. CT data was obtained and used for attenuation correction and anatomic localization. Fasting blood glucose: 95 mg/dl COMPARISON:  Chest CT 01/06/2019.  Bone scan 12/20/2018. FINDINGS: Mediastinal blood pool activity: SUV max 2.0 Liver activity: SUV max Not applicable. NECK: left level 2 jugular node measures 6 mm and likely corresponds to low-level hypermetabolism, including at 3.2 cm on 21/4. Incidental CT findings: No cervical adenopathy. Left carotid atherosclerosis. CHEST: Low-level hypermetabolism corresponding  to upper lung predominant irregular interstitial thickening. More focal area of subpleural right middle lobe consolidation and hypermetabolism, including at a S.U.V. max of 8.2 on 35/8. This is similar in CT appearance 01/06/2019. Hypermetabolism along the course of the left axillary vessels with an equivocal lymph node in this area. Example at 5 mm and a S.U.V. max of 3.3 on 45/4. Otherwise, no thoracic nodal hypermetabolism. Incidental CT findings: Small bilateral pleural effusions are similar. ABDOMEN/PELVIS: Heterogeneous metabolism throughout the liver. The most focal area is within the subcapsular portion of segment 2 (separate from the CT abnormality) and may correspond to vague hypoattenuation. Example at a S.U.V. max of 4.9 on 94/4. A portal caval node measures 5 mm and a S.U.V. max of 3.6 on 99/4. Incidental CT findings: Normal adrenal glands. Large colonic stool burden. Abdominal aortic atherosclerosis. SKELETON: Heterogeneous marrow hypermetabolism is relatively diffuse. Example area of right sacral and iliac hypermetabolism measures a S.U.V. max of 5.5. Incidental CT findings: Tiny sclerotic lesions throughout the marrow space, new since 10/30/2015. IMPRESSION: 1. Pulmonary hypermetabolism which is suspicious for lymphangitic tumor spread. More confluent right middle lobe hypermetabolic consolidation, also suspicious for metastatic disease versus less likely infection. 2. Hypermetabolic abdominal, cervical and possible left axillary nodes, suspicious for metastatic disease. 3. Diffuse hepatic hypermetabolism which is nonspecific. More confluent hypermetabolism within the lateral segment left liver lobe, suspicious for metastasis. Correlation with pre and post contrast abdominal MRI should be considered. 4. Diffuse heterogeneous marrow hypermetabolism, which, especially given the appearance of multifocal sclerosis since 10/30/2015, is most consistent with metastatic disease. 5. Small  bilateral pleural  effusions persist. The previously described pericardial effusion has resolved. 6.  Aortic and branch vessel atherosclerosis. Electronically Signed   By: Abigail Miyamoto M.D.   On: 01/10/2019 11:25   Dg Chest Port 1 View  Result Date: 02/03/2019 CLINICAL DATA:  Right chest pain.  Thoracentesis EXAM: PORTABLE CHEST 1 VIEW COMPARISON:  Ten days ago FINDINGS: Normal size for technique. Reticulonodular density on both sides with small pleural effusions, mildly increased on the right. No pneumothorax. Bony metastatic disease by CT. IMPRESSION: 1. Small bilateral pleural effusion with probable mild increase on the right since study 10 days ago. There may also be right lower lobe atelectasis since prior. 2. 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 Electronically Signed   By: Monte Fantasia M.D.   On: 02/03/2019 04:53   Ir US Chest  Result Date: 01/09/2019 CLINICAL DATA:  53 year old with history of breast cancer. Evaluate for pleural effusion and thoracentesis. EXAM: CHEST ULTRASOUND COMPARISON:  Chest CT 01/06/2019 FINDINGS: There is trace right pleural fluid. No evidence for a safe percutaneous window for thoracentesis. IMPRESSION: Trace right pleural fluid. Thoracentesis not performed due to the small fluid volume. Electronically Signed   By: Markus Daft M.D.   On: 01/09/2019 17:06   US Thoracentesis Asp Pleural Space W/img Guide  Result Date: 02/03/2019 INDICATION: Pleural effusion EXAM: ULTRASOUND GUIDED RIGHT THORACENTESIS MEDICATIONS: None. COMPLICATIONS: None immediate. PROCEDURE: An ultrasound guided thoracentesis was thoroughly discussed with the patient and questions answered. The benefits, risks, alternatives and complications were also discussed. The patient understands and wishes to proceed with the procedure. Written consent was obtained. Ultrasound was performed to localize and mark an adequate pocket of fluid in the right posterior  chest. The area was then prepped and draped in the normal sterile fashion. 1% Lidocaine was used for local anesthesia. Under ultrasound guidance a 19 gauge, 7-cm, Yueh catheter was introduced. Thoracentesis was performed. The catheter was removed and a dressing applied. FINDINGS: A total of approximately 600 mL of clear yellow fluid was removed. Samples were sent to the laboratory as requested by the clinical team. IMPRESSION: Successful ultrasound guided right thoracentesis yielding 600 mL of pleural fluid. Electronically Signed   By: Rolm Baptise M.D.   On: 02/03/2019 10:45    ASSESSMENT AND PLAN: 1.  Metastatic breast cancer 2.  Bilateral malignant pleural effusions 3.  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  Uncontrolled pain  -The patient has a second opinion pending at [facility] scheduled for tomorrow afternoon.  From our standpoint, if her pain is controlled and her breathing is stable, she may be discharged home.  She has outpatient follow-up already scheduled the [place] [facility] on 02/06/2019. -The patient was previously on oxycodone for pain which was ineffective.  Receiving IV morphine.  I would recommend trying her on oral morphine 15 to 30 mg every 4 hours as needed for pain.  We can titrate this as an outpatient. -Labs from today have been reviewed.  She remains neutropenic but has no signs of infection.  Okay to discontinue Granix.  She does not need any additional Granix as an outpatient.  She was instructed to call the cancer center for fever greater than 100.4, chills, or any other signs of infection.   LOS: 1 day   Mikey Bussing, DNP, AGPCNP-BC, AOCNP 02/04/19  Attending Note  I personally saw the patient, reviewed the chart. The plan of care was discussed with the patientI agree with the  assessment and plan as documented above.  We plan to decrease the dosage of Ibrance when she gets discharged from the hospital Patient has appointment at [facility] for second  opinion. I would like to see her in a few days to recheck her blood counts and determine the treatment plan. Pleural effusion status post thoracentesis. Pancytopenia due to [place].  She does not need growth factor injections because she is not febrile

## 2019-02-05 ENCOUNTER — Other Ambulatory Visit: Payer: Self-pay | Admitting: Hematology and Oncology

## 2019-02-05 ENCOUNTER — Other Ambulatory Visit: Payer: Self-pay

## 2019-02-05 ENCOUNTER — Inpatient Hospital Stay (HOSPITAL_COMMUNITY): Payer: 59

## 2019-02-05 DIAGNOSIS — Z515 Encounter for palliative care: Secondary | ICD-10-CM

## 2019-02-05 DIAGNOSIS — Z7189 Other specified counseling: Secondary | ICD-10-CM | POA: Diagnosis not present

## 2019-02-05 DIAGNOSIS — J910 Malignant pleural effusion: Secondary | ICD-10-CM | POA: Diagnosis not present

## 2019-02-05 DIAGNOSIS — R0789 Other chest pain: Secondary | ICD-10-CM

## 2019-02-05 DIAGNOSIS — J90 Pleural effusion, not elsewhere classified: Secondary | ICD-10-CM | POA: Diagnosis not present

## 2019-02-05 DIAGNOSIS — C50311 Malignant neoplasm of lower-inner quadrant of right female breast: Secondary | ICD-10-CM

## 2019-02-05 DIAGNOSIS — C50919 Malignant neoplasm of unspecified site of unspecified female breast: Secondary | ICD-10-CM

## 2019-02-05 DIAGNOSIS — G893 Neoplasm related pain (acute) (chronic): Secondary | ICD-10-CM | POA: Diagnosis not present

## 2019-02-05 DIAGNOSIS — Z170 Estrogen receptor positive status [ER+]: Secondary | ICD-10-CM

## 2019-02-05 LAB — COMPREHENSIVE METABOLIC PANEL
ALT: 37 U/L (ref 0–44)
AST: 56 U/L — ABNORMAL HIGH (ref 15–41)
Albumin: 2.6 g/dL — ABNORMAL LOW (ref 3.5–5.0)
Alkaline Phosphatase: 326 U/L — ABNORMAL HIGH (ref 38–126)
Anion gap: 7 (ref 5–15)
BUN: 15 mg/dL (ref 6–20)
CO2: 31 mmol/L (ref 22–32)
Calcium: 8.7 mg/dL — ABNORMAL LOW (ref 8.9–10.3)
Chloride: 99 mmol/L (ref 98–111)
Creatinine, Ser: 0.57 mg/dL (ref 0.44–1.00)
GFR calc Af Amer: >60 mL/min (ref >60)
GFR calc non Af Amer: >60 mL/min (ref >60)
Glucose, Bld: 117 mg/dL — ABNORMAL HIGH (ref 70–99)
Potassium: 4.3 mmol/L (ref 3.5–5.1)
Sodium: 137 mmol/L (ref 135–145)
Total Bilirubin: 0.7 mg/dL (ref 0.3–1.2)
Total Protein: 5.6 g/dL — ABNORMAL LOW (ref 6.5–8.1)

## 2019-02-05 LAB — CBC
HCT: 25.2 % — ABNORMAL LOW (ref 36.0–46.0)
Hemoglobin: 8.5 g/dL — ABNORMAL LOW (ref 12.0–15.0)
MCH: 28.5 pg (ref 26.0–34.0)
MCHC: 33.7 g/dL (ref 30.0–36.0)
MCV: 84.6 fL (ref 80.0–100.0)
Platelets: 26 10*3/uL — CL (ref 150–400)
RBC: 2.98 MIL/uL — ABNORMAL LOW (ref 3.87–5.11)
RDW: 13.4 % (ref 11.5–15.5)
WBC: 1.2 10*3/uL — CL (ref 4.0–10.5)
nRBC: 1.6 % — ABNORMAL HIGH (ref 0.0–0.2)

## 2019-02-05 MED ORDER — LETROZOLE 2.5 MG PO TABS
2.5000 mg | ORAL_TABLET | Freq: Every day | ORAL | Status: DC
  Administered 2019-02-05 – 2019-02-06 (×2): 2.5 mg via ORAL
  Filled 2019-02-05 (×3): qty 1

## 2019-02-05 MED ORDER — BISACODYL 5 MG PO TBEC: 5.0000 mg | DELAYED_RELEASE_TABLET | Freq: Every day | ORAL | Status: DC | PRN

## 2019-02-05 NOTE — Progress Notes (Signed)
CRITICAL VALUE ALERT  Critical Value:  WBC 1.2, pH 26  Date & Time Notied:  02/05/19 1501  Provider Notified: Cordelia Poche  Orders Received/Actions taken:

## 2019-02-05 NOTE — Consult Note (Signed)
Consultation Note Date: 02/05/2019   Patient Name: Jasmine Allen  DOB: 23-Aug-1965  MRN: [id number]  Age / Sex: 53 y.o., female  PCP: Aletha Halim., PA-C Referring Physician: Mariel Aloe, MD  Reason for Consultation: Establishing goals of care  HPI/Patient Profile: 52 y.o. female  with past medical history of metastatic breast cancer, anxiety, dyslipidemia, GERD with recent PET scan that revealed disease progression of breast cancer admitted on 02/03/2019 with abdominal/chest pain. Dr. Lindi Adie following for metastatic breast cancer with bilateral malignant pleural effusions. S/p thoracentesis this admit with 600cc removed. Pending second opinion with [facility]. Palliative medicine consultation for goals of care.   Clinical Assessment and Goals of Care:  I have reviewed medical records, discussed with care team and met with patient and husband Grayland Ormond) at bedside to discuss diagnosis, Oval, EOL wishes, disposition and options.  Introduced Palliative Medicine as specialized medical care for people living with serious illness. It focuses on providing relief from the symptoms and stress of a serious illness. The goal is to improve quality of life for both the patient and the family.  We discussed a brief life review of the patient. Grayland Ormond and [place] have two children. She worked as a Psychologist, sport and exercise. 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 She was working up until 3-4 weeks ago. Functionally, she has been doing what she can to remain active at home & able to complete household activities. Pain has been impacting her ability to function. Appetite fair.   Discussed her symptoms at length. Pain/coughing spells are worse when she stands up to use the bathroom. Her pain is currently controlled sitting up in bed. Keona is  agreeable to take extended release morphine but is hopeful this will not make her too 'loopy' to the point where she is unable to function and do household activities. We discussed prn morphine for breakthrough pain, and taking this medication prior to activities to stay on top of pain management. She has not had a BM since 10/25. Takes Miralax and Senna daily. Reports relief from prn xanax.   Discussed course of hospitalization including diagnoses, interventions, plan of care. She was supposed to go to [facility] today for second opinion. [name] plans to reschedule this appointment when she is discharged.   I attempted to elicit values and goals of care important to the patient. [name] tells me she is trying to stay "positive" but also wants to be "real" and "realistic" about her diagnosis. She is hopeful for quality of life and again having symptoms managed so she can still somewhat function around the house.   We discussed having 'piece of mind' once she receives second opinion from [place] about options.   Advanced directives, concepts specific to code status, artifical feeding and hydration, and rehospitalization were considered and discussed. [name] and Grayland Ormond have a scheduled appointment with attorney tomorrow, 10/29 at 4pm. Unfortunately she might not make this appointment since she  is hospitalized. If she can't make tomorrow's appointment, [name] plans to reschedule. She shares that attorney has already prepared financial and healthcare POA/living will documentation and her and Grayland Ormond just need to review and complete.   Briefly discussed code status and her thoughts surrounding heroic interventions at EOL. Explained fear that these interventions will cause pain/suffering at the EOL as well as prolong the inevitable, with underlying incurable cancer. Ivelis is nodding her head no during this part of conversation.   Introduced and discussed MOST form as a way to document limitations to  care. No decisions made today but encouraged [name] and Grayland Ormond to consider completing with outpatient provider when ready. It seems that [name] is leaning away from heroic measures at EOL. Encouraged [name] to consider EOL wishes--including in the hospital hooked up to machines vs. Comfort focused care, peace/dignity, and home with family.   Patient copes by prayer and her belief that God will not give her more than she can handle. She enjoys being in her screened in porch, reading a good book. She is very thankful for all the friends that have assisted her and family with meals since her diagnosis.   Palliative Care services outpatient were explained and offered. Patient/husband agreeable. Grayland Ormond asks when do they know if it is time for hospice services. Explained hospice philosophy, options and prognosis of 6 months or less. Again plan is for second opinion at [facility]. Encouraged patient and husband to ask about prognosis at [facility] as well as Dr. Lindi Adie when they follow back up with him.   Questions and concerns were addressed.  Hard Choices booklet left for review. PMT contact information given.     SUMMARY OF RECOMMENDATIONS    Continue current plan of care and medical management.  Pending second opinion from [facility]. Appointment originally scheduled 10/28. Patient plans to reschedule once she is discharged.  Patient also has appointment with attorney 10/29 to complete advance directive documentation. Unfortunately, she might also miss this appointment if still admitted. Patient plans to re-schedule this appointment and understands importance of completing advance directive documentation.   Introduced and discussed MOST form. No decisions made today, but seems that patient is leaning against aggressive/heroic measures at EOL. Hard Choices copy given for review.   Outpatient palliative referral on discharge. RN CM notified. Introduced hospice philosophy and home option.  Continue  symptom management medications.   Code Status/Advance Care Planning:  Full code  Symptom Management:   MS Contin 12hr tab 1m BID  MSIR 15-3575mPO q4h prn pain  Continue IV Morphine prn breakthrough pain  Miralax daily  Senna BID  Dulcolax daily prn   Xanax 0.75m10mO HS prn anxiety/sleep  Palliative Prophylaxis:   Aspiration, Bowel Regimen, Delirium Protocol and Frequent Pain Assessment    Psycho-social/Spiritual:   Desire for further Chaplaincy support: yes  Additional Recommendations: Caregiving  Support/Resources, Compassionate Wean Education and Education on Hospice  Prognosis:   Poor long-term prognosis with metastatic breast cancer  Discharge Planning: Home. Outpatient palliative referral.     Primary Diagnoses: Present on Admission: . Pleural effusion   I have reviewed the medical record, interviewed the patient and family, and examined the patient. The following aspects are pertinent.  Past Medical History:  Diagnosis Date  . Alopecia, unspecified   . Anemia, unspecified    during her pregnancy  . Anxiety state, unspecified   . Breast cancer ([id number]   Right Breast Cancer  . Bulging disc   . Depressive disorder, not  elsewhere classified   . Diverticulosis of colon (without mention of hemorrhage)    CT Scan  . Dysrhythmia    PVCs in the past  . Esophageal reflux   . Esophagitis 1998  . Hiatal hernia 1998  . History of kidney stones   . Hypercholesteremia   . Hypertension   . Migraine   . Opioid abuse, in remission [facility])    Social History   Socioeconomic History  . Marital status: Married    Spouse name: Not on file  . Number of children: Not on file  . Years of education: Not on file  . Highest education level: Not on file  Occupational History  . Not on file  Social Needs  . Financial resource strain: Not on file  . Food insecurity    Worry: Not on file    Inability: Not on file  . Transportation needs    Medical: No     Non-medical: No  Tobacco Use  . Smoking status: Former Smoker    Packs/day: 0.25    Quit date: 08/25/2016    Years since quitting: 2.4  . Smokeless tobacco: Never Used  Substance and Sexual Activity  . Alcohol use: Yes    Comment: "once a week"  . Drug use: No  . Sexual activity: Yes    Birth control/protection: None, Post-menopausal  Lifestyle  . Physical activity    Days per week: Not on file    Minutes per session: Not on file  . Stress: Not on file  Relationships  . Social Herbalist on phone: Not on file    Gets together: Not on file    Attends religious service: Not on file    Active member of club or organization: Not on file    Attends meetings of clubs or organizations: Not on file    Relationship status: Not on file  Other Topics Concern  . Not on file  Social History Narrative  . Not on file   Family History  Problem Relation Age of Onset  . Heart disease Father        CABG  . Breast cancer Mother   . Breast cancer Maternal Grandmother   . Breast cancer Cousin   . Colon cancer Neg Hx   . Stomach cancer Neg Hx    Scheduled Meds: . sodium chloride   Intravenous Once  . atorvastatin  20 mg Oral QHS  . benzonatate  100 mg Oral TID  . feeding supplement (ENSURE ENLIVE)  237 mL Oral TID BM  . feeding supplement (PRO-STAT SUGAR FREE 64)  30 mL Oral BID  . letrozole  2.5 mg Oral Daily  . loratadine  10 mg Oral Daily  . midodrine  5 mg Oral BID WC  . morphine  15 mg Oral Q12H  . multivitamin with minerals  1 tablet Oral Daily  . pantoprazole  40 mg Oral BID AC  . polyethylene glycol  17 g Oral Daily  . senna-docusate  1 tablet Oral BID  . sodium chloride flush  3 mL Intravenous Q12H  . traZODone  150 mg Oral QHS   Continuous Infusions: . sodium chloride     PRN Meds:.sodium chloride, acetaminophen **OR** acetaminophen, albuterol, ALPRAZolam, chlorpheniramine-HYDROcodone, cyclobenzaprine, hyoscyamine, morphine, morphine injection, ondansetron  **OR** ondansetron (ZOFRAN) IV, sodium chloride flush Medications Prior to Admission:  Prior to Admission medications   Medication Sig Start Date End Date Taking? Authorizing Provider  albuterol (PROVENTIL) (2.5 MG/3ML) 0.083% nebulizer  solution Inhale 3 mLs into the lungs every 6 (six) hours as needed for wheezing or shortness of breath (cough). 01/16/19 02/15/19 Yes Dahal, Marlowe Aschoff, MD  ALPRAZolam Duanne Moron) 0.5 MG tablet Take 1 tablet (0.5 mg total) by mouth at bedtime as needed for anxiety or sleep. 01/24/19  Yes Nicholas Lose, MD  atorvastatin (LIPITOR) 20 MG tablet Take 20 mg by mouth at bedtime.  11/27/18  Yes [provider]  benzonatate (TESSALON) 100 MG capsule Take 1 capsule (100 mg total) by mouth 3 (three) times daily. 01/16/19 02/15/19 Yes Dahal, Marlowe Aschoff, MD  Biotin 10000 MCG TABS Take 10,000 mcg by mouth daily.    Yes [provider]  bumetanide (BUMEX) 1 MG tablet TAKE 1 TABLET(1 MG) BY MOUTH TWICE DAILY Patient taking differently: Take 1 mg by mouth 2 (two) times daily.  01/20/19  Yes Nicholas Lose, MD  cetirizine (ZYRTEC) 10 MG tablet Take 10 mg by mouth daily.   Yes [provider]  chlorpheniramine-HYDROcodone (TUSSIONEX PENNKINETIC ER) 10-8 MG/5ML SUER Take 5 mLs by mouth 2 (two) times daily.   Yes [provider]  cloNIDine (CATAPRES) 0.3 MG tablet Take 0.3 mg by mouth at bedtime.    Yes [provider]  cyclobenzaprine (FLEXERIL) 10 MG tablet Take 10 mg by mouth every 8 (eight) hours as needed for muscle spasms. 07/16/17  Yes [provider]  hydrOXYzine (ATARAX/VISTARIL) 10 MG tablet Take 10-30 mg by mouth at bedtime as needed for itching.  10/07/15  Yes [provider]  hyoscyamine (LEVSIN SL) 0.125 MG SL tablet dissolve 1 tablet under the tongue every 4 hours if needed Patient taking differently: Take 0.125 mg by mouth every 4 (four) hours as needed for cramping.  10/02/16  Yes Nandigam, Venia Minks, MD  letrozole (FEMARA) 2.5  MG tablet Take 1 tablet (2.5 mg total) by mouth daily. 01/15/19  Yes Nicholas Lose, MD  LORazepam (ATIVAN) 0.5 MG tablet Take 1 tablet daily, as needed for nausea. Patient taking differently: Take 0.5 mg by mouth at bedtime as needed (nausea). Take 1 tablet daily, as needed for nausea. 12/06/18  Yes Nicholas Lose, MD  Multiple Vitamin (MULITIVITAMIN WITH MINERALS) TABS Take 1 tablet by mouth daily.   Yes [provider]  oxyCODONE-acetaminophen (PERCOCET) 10-325 MG tablet Take 1 tablet by mouth every 4 (four) hours as needed for pain. 01/24/19  Yes Nicholas Lose, MD  pantoprazole (PROTONIX) 40 MG tablet Take 1 tablet (40 mg total) by mouth 2 (two) times daily before a meal. Patient taking differently: Take 40 mg by mouth 2 (two) times daily.  06/28/16  Yes Nandigam, Venia Minks, MD  senna (SENOKOT) 8.6 MG TABS tablet Take 1 tablet (8.6 mg total) by mouth 2 (two) times daily. 01/16/19 02/15/19 Yes Dahal, Marlowe Aschoff, MD  traZODone (DESYREL) 100 MG tablet Take 150 mg by mouth at bedtime.  10/17/12  Yes [provider]  palbociclib (IBRANCE) 75 MG tablet Take 1 tablet (75 mg total) by mouth daily. Take for 21 days on, 7 days off, repeat every 28 days. 02/04/19   Nicholas Lose, MD   Allergies  Allergen Reactions  . Amoxicillin-Pot Clavulanate Other (See Comments)    Big doses cause diarrhea   . Erythromycin Rash  . Metoclopramide Hcl Other (See Comments)    Restless legs  . Sulfonamide Derivatives Rash   Review of Systems  Constitutional: Positive for activity change.  Respiratory: Positive for shortness of breath.   Cardiovascular: Positive for chest pain.   Physical Exam  Vitals signs and nursing note reviewed.  Constitutional:      General: She is awake.  Pulmonary:     Effort: No tachypnea, accessory muscle usage or respiratory distress.  Skin:    General: Skin is warm and dry.  Neurological:     Mental Status: She is alert and oriented to person, place, and time.  Psychiatric:         Mood and Affect: Mood normal.        Speech: Speech normal.        Behavior: Behavior normal.        Cognition and Memory: Cognition normal.     Vital Signs: BP 94/65 (BP Location: Right Arm)   Pulse 82   Temp 98.2 F (36.8 C) (Oral)   Resp 16   Ht 5' (1.524 m)   Wt 52.5 kg   LMP 12/24/2010   SpO2 99%   BMI 22.60 kg/m  Pain Scale: 0-10 POSS *See Group Information*: 1-Acceptable,Awake and alert Pain Score: 7    SpO2: SpO2: 99 % O2 Device:SpO2: 99 % O2 Flow Rate: .O2 Flow Rate (L/min): 3 L/min  IO: Intake/output summary:   Intake/Output Summary (Last 24 hours) at 02/05/2019 1629 Last data filed at 02/05/2019 1300 Gross per 24 hour  Intake -  Output 280 ml  Net -280 ml    LBM: Last BM Date: 02/02/19 Baseline Weight: Weight: 50.8 kg Most recent weight: Weight: 52.5 kg     Palliative Assessment/Data: PPS 50%   Flowsheet Rows     Most Recent Value  Intake Tab  Referral Department  Hospitalist  Unit at Time of Referral  Med/Surg Unit  Palliative Care Primary Diagnosis  Cancer  Date Notified  02/03/19  Palliative Care Type  New Palliative care  Date first seen by Palliative Care  02/05/19  # of days Palliative referral response time  2 Day(s)  Clinical Assessment  Palliative Performance Scale Score  50%  Psychosocial & Spiritual Assessment  Palliative Care Outcomes  Patient/Family meeting held?  Yes  Who was at the meeting?  patient and husband  Palliative Care Outcomes  Clarified goals of care, Provided end of life care assistance, Provided psychosocial or spiritual support, ACP counseling assistance, Linked to palliative care logitudinal support      Time In:1500 Time Out: 1610 Time Total:70 Greater than 50%  of this time was spent counseling and coordinating care related to the above assessment and plan.  Signed by:  Ihor Dow, DNP, FNP-C Palliative Medicine Team  Phone: [phone] Fax: [phone]   Please contact Palliative Medicine  Team phone at [phone] for questions and concerns.  For individual provider: See Shea Evans

## 2019-02-05 NOTE — Progress Notes (Signed)
Per Dr. Geralyn Flash office, pt okay to continue letrozole dose 2.5mg . MD notified.

## 2019-02-05 NOTE — Progress Notes (Signed)
PROGRESS NOTE    GHINA MEHRHOFF  [id number] DOB: 1965/09/03 DOA: 02/03/2019 PCP: Aletha Halim., PA-C   Brief Narrative: JODEL SCIANDRA is a 53 y.o. femalewith medical history significant forbreast cancer with metastasis, anxiety, dyslipidemia, and GERD. Patient presented secondary to right lower chest pain and found to have bilateral pleural effusions (recurrent).    Assessment & Plan:   Active Problems:   Pleural effusion, bilateral   Palliative care by specialist   Goals of care, counseling/discussion   Atypical chest pain   Metastatic breast cancer ([place])   Cancer related pain   Bilateral pleural effusions Malignant pleural effusion Patient is s/p thoracentesis on admission. Physical exam suggests reaccumulation. Chest x-ray obtained today shows recurrent effusion. Patient continues to have reaccumulation. Briefly discussed possibility of Pleurx catheter to reduce need for recurrent. thoracenteses/hospitalizations. -Repeat US thoracentesis  Dyspnea Secondary to above. Patient improved but still not at baseline. Significant coughing spells make symptoms worse. CTA negative for PE. -Management above  Cough Likely related to effusions -Continue Tussionex -Further management above  Pancytopenia Likely secondary to Ibrance. Oncology recommends this be held. Patient received 1 unit of platelets to date. Numbers stable today -Repeat CBC in AM  Acute on chronic pain Appears to be secondary to her cancer. ?effusion vs liver pathology with regard to location of pain. Oral regimen adjusted but not controlling pain well enough -Continue Morphine PO and IV for breakthrough -Palliative care consulted and recommendations pending  Metastatic breast cancer On Ibrance and letrozole as an outpatient. Follows with Dr. Lindi Adie.  GERD -Continue Protonix  Anxiety -Continue Xanax as needed  Hyperlipidemia -Continue Lipitor  Hypotension -Continue midodrine   DVT  prophylaxis: SCDs Code Status:   Code Status: Full Code Family Communication: Husband at bedside Disposition Plan: Discharge possibly in 24 hours if baseline dyspnea   Consultants:   Medical oncology  Palliative care medicine  Procedures:   Thoracentesis  Antimicrobials:  None    Subjective: Dyspnea today. Significant cough especially with ambulation; non-productive.  Objective: Vitals:   02/04/19 1442 02/04/19 2015 02/05/19 0548 02/05/19 1309  BP: (!) 99/56 93/61 106/71 94/65  Pulse: 82 83 84 82  Resp: 18 16 15 16   Temp: 98.6 F (37 C) 98.9 F (37.2 C) 99.1 F (37.3 C) 98.2 F (36.8 C)  TempSrc: Oral Oral Oral Oral  SpO2: 96% 97% 96% 99%  Weight:      Height:        Intake/Output Summary (Last 24 hours) at 02/05/2019 1650 Last data filed at 02/05/2019 1300 Gross per 24 hour  Intake -  Output 280 ml  Net -280 ml   Filed Weights   02/03/19 0356 02/03/19 1643  Weight: 50.8 kg 52.5 kg    Examination:  General exam: Appears calm and comfortable Respiratory system: Significantly decreased breath sounds at bilateral bases. Does not easily complete long sentesnces Cardiovascular system: S1 & S2 heard, RRR. No murmurs, rubs, gallops or clicks. Gastrointestinal system: Abdomen is nondistended, soft and nontender. No organomegaly or masses felt. Normal bowel sounds heard. Central nervous system: Alert and oriented. No focal neurological deficits. Extremities: No edema. No calf tenderness Skin: No cyanosis. No rashes Psychiatry: Judgement and insight appear normal. Mood & affect appropriate.     Data Reviewed: I have personally reviewed following labs and imaging studies  CBC: Recent Labs  Lab 02/03/19 0411 02/04/19 0528 02/05/19 1409  WBC 1.2* 1.8* 1.2*  NEUTROABS 0.6* 1.0*  --   HGB 9.8* 8.3* 8.5*  HCT  28.9* 25.0* 25.2*  MCV 82.8 85.0 84.6  PLT 16* 34* 26*   Basic Metabolic Panel: Recent Labs  Lab 02/03/19 0411 02/04/19 0528 02/05/19 1409   NA 137 138 137  K 3.1* 3.5 4.3  CL 98 102 99  CO2 28 29 31   GLUCOSE 137* 92 117*  BUN 14 12 15   CREATININE 0.73 0.68 0.57  CALCIUM 8.5* 8.7* 8.7*  MG  --  2.0  --    GFR: Estimated Creatinine Clearance: 59.8 mL/min (by C-G formula based on SCr of 0.57 mg/dL). Liver Function Tests: Recent Labs  Lab 02/03/19 0411 02/05/19 1409  AST 82* 56*  ALT 52* 37  ALKPHOS 381* 326*  BILITOT 1.0 0.7  PROT 6.1* 5.6*  ALBUMIN 3.2* 2.6*   No results for input(s): LIPASE, AMYLASE in the last 168 hours. No results for input(s): AMMONIA in the last 168 hours. Coagulation Profile: Recent Labs  Lab 02/03/19 0411  INR 1.1   Cardiac Enzymes: No results for input(s): CKTOTAL, CKMB, CKMBINDEX, TROPONINI in the last 168 hours. BNP (last 3 results) No results for input(s): PROBNP in the last 8760 hours. HbA1C: No results for input(s): HGBA1C in the last 72 hours. CBG: No results for input(s): GLUCAP in the last 168 hours. Lipid Profile: No results for input(s): CHOL, HDL, LDLCALC, TRIG, CHOLHDL, LDLDIRECT in the last 72 hours. Thyroid Function Tests: No results for input(s): TSH, T4TOTAL, FREET4, T3FREE, THYROIDAB in the last 72 hours. Anemia Panel: No results for input(s): VITAMINB12, FOLATE, FERRITIN, TIBC, IRON, RETICCTPCT in the last 72 hours. Sepsis Labs: No results for input(s): PROCALCITON, LATICACIDVEN in the last 168 hours.  Recent Results (from the past 240 hour(s))  SARS CORONAVIRUS 2 (TAT 6-24 HRS) Nasopharyngeal Nasopharyngeal Swab     Status: None   Collection Time: 02/03/19  6:32 AM   Specimen: Nasopharyngeal Swab  Result Value Ref Range Status   SARS Coronavirus 2 NEGATIVE NEGATIVE Final    Comment: (NOTE) SARS-CoV-2 target nucleic acids are NOT DETECTED. The SARS-CoV-2 RNA is generally detectable in upper and lower respiratory specimens during the acute phase of infection. Negative results do not preclude SARS-CoV-2 infection, do not rule out co-infections with other  pathogens, and should not be used as the sole basis for treatment or other patient management decisions. Negative results must be combined with clinical observations, patient history, and epidemiological information. The expected result is Negative. Fact Sheet for Patients: SugarRoll.be Fact Sheet for Healthcare Providers: https://www.woods-mathews.com/ This test is not yet approved or cleared by the [place] FDA and  has been authorized for detection and/or diagnosis of SARS-CoV-2 by FDA under an Emergency Use Authorization (EUA). This EUA will remain  in effect (meaning this test can be used) for the duration of the COVID-19 declaration under Section 56 4(b)(1) of the Act, 21 U.S.C. section 360bbb-3(b)(1), unless the authorization is terminated or revoked sooner. Performed at [facility], [place] [address] [place], [place] [address]   Gram stain     Status: None   Collection Time: 02/03/19 10:23 AM   Specimen: Pleura; Body Fluid  Result Value Ref Range Status   Specimen Description PLEURAL  Final   Special Requests NONE  Final   Gram Stain   Final    NO ORGANISMS SEEN WBC PRESENT,BOTH PMN AND MONONUCLEAR CYTOSPIN SMEAR Performed at [facility], [address] [place], [place] [address]    Report Status 02/03/2019 FINAL  Final  Culture, body fluid-bottle     Status: None (  Preliminary result)   Collection Time: 02/03/19 10:23 AM   Specimen: Pleura  Result Value Ref Range Status   Specimen Description PLEURAL  Final   Special Requests BOTTLES DRAWN AEROBIC AND ANAEROBIC 10CC  Final   Culture   Final    NO GROWTH 2 DAYS Performed at [facility], [address] [place], [place] [address]    Report Status PENDING  Incomplete         Radiology Studies: Dg Chest 2 View  Result Date: 02/05/2019 CLINICAL DATA:  History of metastatic breast cancer.  Severe pain. EXAM: CHEST - 2 VIEW COMPARISON:  02/03/2019.  Single-view  of the chest and CT chest FINDINGS: Right pleural effusion appears mildly increased compared to the prior plain films. Much smaller left pleural effusion is unchanged. No pneumothorax. Extensive reticulonodular opacities throughout both lungs and chronic opacity in the right middle lobe again seen. Heart size is normal. Osseous metastatic disease noted. IMPRESSION: Mild increase in right pleural effusion since the most recent plain film. Small left pleural effusion is unchanged. Stable appearance of chronic right middle lobe opacity and diffuse reticulonodular change. Electronically Signed   By: Inge Rise M.D.   On: 02/05/2019 12:48        Scheduled Meds: . sodium chloride   Intravenous Once  . atorvastatin  20 mg Oral QHS  . benzonatate  100 mg Oral TID  . feeding supplement (ENSURE ENLIVE)  237 mL Oral TID BM  . feeding supplement (PRO-STAT SUGAR FREE 64)  30 mL Oral BID  . letrozole  2.5 mg Oral Daily  . loratadine  10 mg Oral Daily  . midodrine  5 mg Oral BID WC  . morphine  15 mg Oral Q12H  . multivitamin with minerals  1 tablet Oral Daily  . pantoprazole  40 mg Oral BID AC  . polyethylene glycol  17 g Oral Daily  . senna-docusate  1 tablet Oral BID  . sodium chloride flush  3 mL Intravenous Q12H  . traZODone  150 mg Oral QHS   Continuous Infusions: . sodium chloride       LOS: 2 days     Cordelia Poche, MD [facility] 02/05/2019, 4:50 PM  If 7PM-7AM, please contact night-coverage [email or web address]

## 2019-02-05 NOTE — Telephone Encounter (Signed)
Oral Oncology Patient Advocate Encounter  [facility] has received the [facility] prescription, they have everything they need from [place] and are processing the prescription.  There is no copay information yet but I will follow up again and get the patient a copay card if needed.  Oral Oncology clinic will continue to follow.  [place] Patient [place] Phone [phone] Fax [phone] 02/05/2019   8:51 AM

## 2019-02-06 ENCOUNTER — Inpatient Hospital Stay (HOSPITAL_COMMUNITY): Payer: 59

## 2019-02-06 ENCOUNTER — Inpatient Hospital Stay: Payer: 59

## 2019-02-06 ENCOUNTER — Inpatient Hospital Stay: Payer: 59 | Admitting: Hematology and Oncology

## 2019-02-06 ENCOUNTER — Telehealth: Payer: Self-pay | Admitting: Hematology and Oncology

## 2019-02-06 ENCOUNTER — Telehealth: Payer: Self-pay

## 2019-02-06 DIAGNOSIS — G893 Neoplasm related pain (acute) (chronic): Secondary | ICD-10-CM | POA: Diagnosis not present

## 2019-02-06 DIAGNOSIS — R0789 Other chest pain: Secondary | ICD-10-CM

## 2019-02-06 DIAGNOSIS — J910 Malignant pleural effusion: Secondary | ICD-10-CM | POA: Diagnosis not present

## 2019-02-06 DIAGNOSIS — J90 Pleural effusion, not elsewhere classified: Secondary | ICD-10-CM

## 2019-02-06 DIAGNOSIS — Z7189 Other specified counseling: Secondary | ICD-10-CM | POA: Diagnosis not present

## 2019-02-06 DIAGNOSIS — C50919 Malignant neoplasm of unspecified site of unspecified female breast: Secondary | ICD-10-CM | POA: Diagnosis not present

## 2019-02-06 DIAGNOSIS — Z515 Encounter for palliative care: Secondary | ICD-10-CM | POA: Diagnosis not present

## 2019-02-06 DIAGNOSIS — R1011 Right upper quadrant pain: Secondary | ICD-10-CM

## 2019-02-06 LAB — CBC
HCT: 22.8 % — ABNORMAL LOW (ref 36.0–46.0)
Hemoglobin: 7.7 g/dL — ABNORMAL LOW (ref 12.0–15.0)
MCH: 28.5 pg (ref 26.0–34.0)
MCHC: 33.8 g/dL (ref 30.0–36.0)
MCV: 84.4 fL (ref 80.0–100.0)
Platelets: 19 10*3/uL — CL (ref 150–400)
RBC: 2.7 MIL/uL — ABNORMAL LOW (ref 3.87–5.11)
RDW: 13.3 % (ref 11.5–15.5)
WBC: 0.9 10*3/uL — CL (ref 4.0–10.5)
nRBC: 2.2 % — ABNORMAL HIGH (ref 0.0–0.2)

## 2019-02-06 LAB — GAMMA GT: GGT: 114 U/L — ABNORMAL HIGH (ref 7–50)

## 2019-02-06 MED ORDER — MORPHINE SULFATE 15 MG PO TABS: 15.0000 mg | ORAL_TABLET | Freq: Four times a day (QID) | ORAL | 0 refills | Status: DC | PRN

## 2019-02-06 MED ORDER — ALPRAZOLAM 0.5 MG PO TABS
0.5000 mg | ORAL_TABLET | Freq: Three times a day (TID) | ORAL | Status: DC | PRN
  Administered 2019-02-06: 0.5 mg via ORAL
  Filled 2019-02-06: qty 1

## 2019-02-06 MED ORDER — POLYETHYLENE GLYCOL 3350 17 G PO PACK: 17.0000 g | PACK | Freq: Two times a day (BID) | ORAL | Status: DC

## 2019-02-06 MED ORDER — LIDOCAINE HCL 1 % IJ SOLN
INTRAMUSCULAR | Status: AC
  Filled 2019-02-06: qty 10

## 2019-02-06 MED ORDER — MORPHINE SULFATE ER 15 MG PO TBCR: 15.0000 mg | EXTENDED_RELEASE_TABLET | Freq: Two times a day (BID) | ORAL | 0 refills | Status: DC

## 2019-02-06 MED ORDER — POLYETHYLENE GLYCOL 3350 17 G PO PACK: 17.0000 g | PACK | Freq: Two times a day (BID) | ORAL | 0 refills | Status: DC

## 2019-02-06 MED ORDER — SENNOSIDES-DOCUSATE SODIUM 8.6-50 MG PO TABS: 2.0000 | ORAL_TABLET | Freq: Two times a day (BID) | ORAL | Status: DC

## 2019-02-06 NOTE — TOC Initial Note (Signed)
Transition of Care [facility]) - Initial/Assessment Note    Patient Details  Name: Jasmine Allen MRN: [id number] Date of Birth: 12/09/1965  [facility]) CM/SW Contact:    Lynnell Catalan, RN Phone Number: 02/06/2019, 3:58 PM  Clinical Narrative:                 CM consult for home palliative services. This CM spoke with pt and husband at bedside about DC plan. They chose [facility] for Palliative care outpatient. She already has home 02 through [employer]. No other DME needs at this time.  Expected Discharge Plan: Home/Self Care(Palliative) Barriers to Discharge: Continued Medical Work up   Patient Goals and CMS Choice        Expected Discharge Plan and Services Expected Discharge Plan: Home/Self Care(Palliative)     Post Acute Care Choice: Hospice(Palliative) Living arrangements for the past 2 months: Single Family Home                                      Prior Living Arrangements/Services Living arrangements for the past 2 months: Single Family Home Lives with:: Spouse Patient language and need for interpreter reviewed:: Yes                 Activities of Daily Living Home Assistive Devices/Equipment: None ADL Screening (condition at time of admission) Patient's cognitive ability adequate to safely complete daily activities?: Yes Is the patient deaf or have difficulty hearing?: No Does the patient have difficulty seeing, even when wearing glasses/contacts?: No Does the patient have difficulty concentrating, remembering, or making decisions?: No Patient able to express need for assistance with ADLs?: Yes Does the patient have difficulty dressing or bathing?: No Independently performs ADLs?: Yes (appropriate for developmental age) Does the patient have difficulty walking or climbing stairs?: No Weakness of Legs: None Weakness of Arms/Hands: None  Permission Sought/Granted         Permission granted to share info w AGENCY: [facility]         Emotional Assessment Appearance:: Appears stated age Attitude/Demeanor/Rapport: Sedated Affect (typically observed): Accepting Orientation: : Oriented to Self, Oriented to Place, Oriented to  Time, Oriented to Situation      Admission diagnosis:  Pleural effusion [J90] Thrombocytopenia (HCC) [D69.6] Atypical chest pain [R07.89] Status post thoracentesis [Z98.890] Chemotherapy-induced neutropenia (HCC) [D70.1, T45.1X5A] Pleural effusion, bilateral [J90] Patient Active Problem List   Diagnosis Date Noted  . Palliative care by specialist   . Goals of care, counseling/discussion   . Atypical chest pain   . Metastatic breast cancer ([place])   . Cancer related pain   . Abnormal CT scan, lung 01/13/2019  . Pleural effusion, bilateral 01/13/2019  . Dyspnea 01/13/2019  . Community acquired pneumonia 01/03/2019  . Port-A-Cath in place 10/23/2017  . Malignant neoplasm of lower-inner quadrant of right breast of female, estrogen receptor positive ([place]) 08/23/2017  . Pain of anterior chest wall with respiration 04/07/2014  . Smoking 04/07/2014  . ANEMIA, IRON DEFICIENCY 10/20/2009  . DEPRESSION 06/15/2009  . GERD 06/15/2009  . IRRITABLE BOWEL SYNDROME 06/15/2009  . RECTAL BLEEDING 06/15/2009   PCP:  Aletha Halim., PA-C Pharmacy:   [facility] [phone] - [place], [place] [address] [place] [id number] [place] [address] Phone: [phone] Fax: [place], [place]  [place] [address] Phone: [phone] Fax: [phone]  [facility], [place] [address] [place] [address] Phone: [phone] Fax: [phone]     Social Determinants of Health (SDOH) Interventions    Readmission Risk Interventions No flowsheet data found.

## 2019-02-06 NOTE — Progress Notes (Signed)
CT looks okay. Will have her come in to my office Thursday next week w/ CXR to see how doing. If has rapid re-accumulation will have to figure out timing of pleurX. She will call if breathing worsens in interim. Fine for home from my standpoint.

## 2019-02-06 NOTE — Telephone Encounter (Signed)
-----   Message from Candee Furbish, MD sent at 02/06/2019  4:52 PM EDT ----- Regarding: Appt with me Thurs AM please with CXR.  Thanks!

## 2019-02-06 NOTE — Procedures (Signed)
PROCEDURE SUMMARY:  Successful US guided right thoracentesis. Yielded 500 mL of clear amber fluid. Pt tolerated procedure well. No immediate complications.  Specimen was not sent for labs. CXR ordered.  EBL < 5 mL  Ascencion Dike PA-C 02/06/2019 10:05 AM

## 2019-02-06 NOTE — Discharge Instructions (Signed)
Jasmine Allen,  You were in the hospital because of breathing issues and fluid around your lungs. You had this fluid removed multiple times. It would be beneficial for you to have a Pleurx catheter placed which will be arranged as an outpatient. Your blood counts are also low and you will need to follow precautions for increased risk of bleeding. Dr. Lindi Adie will follow-up with you with regard to your blood counts. Please go to the emergency room if evidence of or concern for bleeding. With regard to your pain, you will be prescribed a new regimen for pain control. Your ultrasound of your gallbladder did not show gallstones, ductal dilation or liver pathology.   I have recommended for you to discontinue some of your medications as they can cause an added effect to your narcotic use. I recommend you do not use benzodiazepines with chronic/scheduled opiates until effects can more safely be predicted.

## 2019-02-06 NOTE — Progress Notes (Signed)
Daily Progress Note   Patient Name: Jasmine Allen       Date: 02/06/2019 DOB: [birth year]  Age: 53 y.o. MRN#: [id number] Attending Physician: Mariel Aloe, MD Primary Care Physician: Aletha Halim., PA-C Admit Date: 02/03/2019  Reason for Consultation/Follow-up: Establishing goals of care  Subjective/GOC: Patient had thoracentesis this morning. She feels uncomfortable following thoracentesis and mildly short of breath. Attending has ordered CT chest to evaluate. She reports pain relief from scheduled extended relief morphine and MSIR.   Discussed recurrent, malignant pleural effusions and she may benefit from pleurx cath placement in near future to help manage symptoms from pleural effusions as well as something that can be managed at home.   Husband at bedside. Jasmine Allen shares that she is still 'trying to wrap by head around this' but also that she is a spiritual individual, and this is God's plan for her. He won't give her more than she can handle. She speaks highly of great support from family and friends.   Again discussed outpatient palliative referral at home on discharge. Patient/husband agreeable.   Length of Stay: 3  Current Medications: Scheduled Meds:  . sodium chloride   Intravenous Once  . atorvastatin  20 mg Oral QHS  . benzonatate  100 mg Oral TID  . feeding supplement (ENSURE ENLIVE)  237 mL Oral TID BM  . feeding supplement (PRO-STAT SUGAR FREE 64)  30 mL Oral BID  . letrozole  2.5 mg Oral Daily  . lidocaine      . loratadine  10 mg Oral Daily  . midodrine  5 mg Oral BID WC  . morphine  15 mg Oral Q12H  . multivitamin with minerals  1 tablet Oral Daily  . pantoprazole  40 mg Oral BID AC  . polyethylene glycol  17 g Oral Daily  . senna-docusate  1 tablet  Oral BID  . sodium chloride flush  3 mL Intravenous Q12H  . traZODone  150 mg Oral QHS    Continuous Infusions: . sodium chloride      PRN Meds: sodium chloride, acetaminophen **OR** acetaminophen, albuterol, ALPRAZolam, bisacodyl, chlorpheniramine-HYDROcodone, cyclobenzaprine, hyoscyamine, morphine, morphine injection, ondansetron **OR** ondansetron (ZOFRAN) IV, sodium chloride flush  Physical Exam Vitals signs and nursing note reviewed.  Constitutional:      General: She is awake.  Pulmonary:  Effort: No tachypnea, accessory muscle usage or respiratory distress.  Neurological:     Mental Status: She is alert and oriented to person, place, and time.            Vital Signs: BP 120/78 (BP Location: Left Arm)   Pulse 91   Temp 98.6 F (37 C) (Oral)   Resp 16   Ht 5' (1.524 m)   Wt 52.5 kg   LMP 12/24/2010   SpO2 100%   BMI 22.60 kg/m  SpO2: SpO2: 100 % O2 Device: O2 Device: Nasal Cannula O2 Flow Rate: O2 Flow Rate (L/min): 3 L/min  Intake/output summary:   Intake/Output Summary (Last 24 hours) at 02/06/2019 1223 Last data filed at 02/05/2019 1300 Gross per 24 hour  Intake -  Output 280 ml  Net -280 ml   LBM: Last BM Date: 02/02/19 Baseline Weight: Weight: 50.8 kg Most recent weight: Weight: 52.5 kg       Palliative Assessment/Data: PPS 50%   Flowsheet Rows     Most Recent Value  Intake Tab  Referral Department  Hospitalist  Unit at Time of Referral  Med/Surg Unit  Palliative Care Primary Diagnosis  Cancer  Date Notified  02/03/19  Palliative Care Type  New Palliative care  Date first seen by Palliative Care  02/05/19  # of days Palliative referral response time  2 Day(s)  Clinical Assessment  Palliative Performance Scale Score  50%  Psychosocial & Spiritual Assessment  Palliative Care Outcomes  Patient/Family meeting held?  Yes  Who was at the meeting?  patient and husband  Palliative Care Outcomes  Clarified goals of care, Provided end of life  care assistance, Provided psychosocial or spiritual support, ACP counseling assistance, Linked to palliative care logitudinal support      Patient Active Problem List   Diagnosis Date Noted  . Palliative care by specialist   . Goals of care, counseling/discussion   . Atypical chest pain   . Metastatic breast cancer ([place])   . Cancer related pain   . Abnormal CT scan, lung 01/13/2019  . Pleural effusion, bilateral 01/13/2019  . Dyspnea 01/13/2019  . Community acquired pneumonia 01/03/2019  . Port-A-Cath in place 10/23/2017  . Malignant neoplasm of lower-inner quadrant of right breast of female, estrogen receptor positive ([place]) 08/23/2017  . Pain of anterior chest wall with respiration 04/07/2014  . Smoking 04/07/2014  . ANEMIA, IRON DEFICIENCY 10/20/2009  . DEPRESSION 06/15/2009  . GERD 06/15/2009  . IRRITABLE BOWEL SYNDROME 06/15/2009  . RECTAL BLEEDING 06/15/2009    Palliative Care Assessment & Plan   Patient Profile: 53 y.o. female  with past medical history of metastatic breast cancer, anxiety, dyslipidemia, GERD with recent PET scan that revealed disease progression of breast cancer admitted on 02/03/2019 with abdominal/chest pain. Dr. Lindi Adie following for metastatic breast cancer with bilateral malignant pleural effusions. S/p thoracentesis this admit with 600cc removed. Pending second opinion with [place] [facility]. Palliative medicine consultation for goals of care.   Assessment: Metastatic breast cancer Bilateral pleural effusions Pancytopenia Cancer-related pain Anxiety  Recommendations/Plan:  Pending second opinion from [facility]. Also continued follow-up at [facility].  Patient/husband are working with attorney to complete advance directive documentation outpatient.   Continue current symptom management regimen.  Patient may benefit from pleurx cath placement with recurrent pleural effusions.   Outpatient palliative referral at home for ongoing [place]  discussions.   Code Status: FULL   Code Status Orders  (From admission, onward)  Start     Ordered   02/03/19 1113  Full code  Continuous     02/03/19 1113        Code Status History    Date Active Date Inactive Code Status Order ID Comments User Context   01/13/2019 1749 01/16/2019 1742 Full Code [id number]  Terrilee Croak, MD ED   Advance Care Planning Activity       Prognosis:   Poor long-term prognosis with metastatic breast cancer.  Discharge Planning:  Home with Palliative Services  Care plan was discussed with patient, husband  Thank you for allowing the Palliative Medicine Team to assist in the care of this patient.       Total Time 83min Prolonged Time Billed  no      Greater than 50%  of this time was spent counseling and coordinating care related to the above assessment and plan.  Ihor Dow, DNP, FNP-C Palliative Medicine Team  Phone: [phone] Fax: [phone]  Please contact Palliative Medicine Team phone at [phone] for questions and concerns.

## 2019-02-06 NOTE — Telephone Encounter (Signed)
Scheduled appt per 10/28 sch message - pt is aware of appt date and time.   

## 2019-02-06 NOTE — Consult Note (Signed)
Pulmonary Consult Note  Date of Service: 01/13/2019  Consulting Physician: Lindi Adie  CC: chest pain  HPI: 53 year old woman with hx of recurrent metastatic breast cancer with R pleural involvementon chemotherapy presenting with atypical chest pain that was relieved with thoracentesis at OSH (600cc). She had another thoracentesis here 2 days later (400cc) but did not have the same relief.  Med Hx Recurrent breast cancer on Ibrance Chemo-induced pancytopenia HLD Hiatal hernia Depression/anxiety HTN   Surg Hx Lumpectomy x 2, axillary node sampling Tonsillectomy  Thora x 3  Social Hx Former light smoker Lives with husband  Family Hx Breast cancer in multiple family members  ROS  Positive Symptoms in bold: Constitutional fevers, chills, weight loss, fatigue, anorexia, malaise  Eyes decreased vision, double vision, eye irritation  Ears, Nose, Mouth, Throat sore throat, trouble swallowing, sinus congestion  Cardiovascular chest pain, paroxysmal nocturnal dyspnea, lower ext edema, palpitations   Respiratory SOB, cough, DOE, hemoptysis, wheezing  Gastrointestinal nausea, vomiting, diarrhea  Genitourinary burning with urination, trouble urinating  Musculoskeletal joint aches, joint swelling, back pain  Integumentary  rashes, skin lesions  Neurological focal weakness, focal numbness, trouble speaking, headaches  Psychiatric depression, anxiety, confusion  Endocrine polyuria, polydipsia, cold intolerance, heat intolerance  Hematologic abnormal bruising, abnormal bleeding, unexplained nose bleeds  Allergic/Immunologic recurrent infections, hives, swollen lymph nodes   Phys Exam GEN: middle aged woman in NAD HEENT: no thrush, RRR CV: RRR, ext warm PULM: Clear, no accessory muscle use GI: Soft, +BS EXT: No edema NEURO: Moves all 4 ext to command PSYCH: AOx3, excellent insight  SKIN: Pale  Bedside US: fluid collection posteriorly with some loculations   Studies CTA  02/03/19 IMPRESSION: 1. Large right and moderate left pleural effusion which have progressed from PET 01/09/2019. The fluid is dependent/layering. 2. Negative for pulmonary embolism. 3. Extensive metastatic disease as recently staged.   Assessment # Recurrent malignant R effusion, relieved with thoracentesis a few days ago, made worse by tap this AM # Abnormal [place] chest # Atypical chest pain related to some combination of inflammatory fluid, entrapped lung, and bony metastases # Metastatic breast cancer on chemo # Chemo induced pancytopenia  Plan - Check CT of chest given increased pain after thoracentesis, unusual [place] to r/o hemothorax - If shows hemothorax/hematoma, probably need to keep a day to ensure HD stability - If CT shows a good pocket of fluid, will set up for pleurx tomorrow AM at bedside - If not much fluid, will arrange f/u next week in clinic with CXR with plans for pleurX if any reaccumulation - I will circle back and talk to patient about plan  Erskine Emery MD PCCM

## 2019-02-06 NOTE — Progress Notes (Signed)
PROGRESS NOTE    LEILANY AINSLEY  [id number] DOB: [birth year] DOA: 02/03/2019 PCP: Aletha Halim., PA-C   Brief Narrative: Jasmine Allen is a 53 y.o. femalewith medical history significant forbreast cancer with metastasis, anxiety, dyslipidemia, and GERD. Patient presented secondary to right lower chest pain and found to have bilateral pleural effusions (recurrent).    Assessment & Plan:   Active Problems:   Pleural effusion, bilateral   Palliative care by specialist   Goals of care, counseling/discussion   Atypical chest pain   Metastatic breast cancer ([place])   Cancer related pain   Bilateral pleural effusions Malignant pleural effusion Patient is s/p thoracentesis on admission. Physical exam suggests reaccumulation. Chest x-ray obtained today shows recurrent effusion. Patient continues to have reaccumulation. Briefly discussed possibility of Pleurx catheter to reduce need for recurrent thoracenteses/hospitalizations on 10/28 and again today. Also discussed with oncology. Repeat thoracentesis yielded 500 mL of fluid  Dyspnea Secondary to above. Patient improved but still not at baseline. Significant coughing spells make symptoms worse. CTA negative for PE. -Management above  Cough Likely related to effusions. Appears to be improved today. -Continue Tussionex -Further management above  Pancytopenia Likely secondary to Ibrance. Oncology recommends this be held. Patient received 1 unit of platelets to date. Numbers trending down. No change in management per oncology  Acute on chronic pain Appears to be secondary to her cancer. ?effusion vs liver pathology with regard to location of pain. Oral regimen adjusted and is controlling pain better. Patient becoming slightly delirious during the night. Required morphine this morning for new chest pain after her thoracentesis. -Continue Morphine PO and IV for breakthrough -Repeat chest x-ray since pain worsened after  thoracentesis today.  Elevated LFTs/Alkaline phosphatase PET CT suggests possible metastatic disease. Highly concerning for metastatic liver lesions. Patient still has her gallbladder and pain is persistent in that area. GGT elevated. -Will obtain RUQ ultrasound to rule out gallbladder pathology  Metastatic breast cancer On Ibrance and letrozole as an outpatient. Follows with Dr. Lindi Adie. Plan for second opinion at [facility]  GERD -Continue Protonix  Anxiety -Continue Xanax as needed  Hyperlipidemia -Continue Lipitor  Hypotension -Continue midodrine   DVT prophylaxis: SCDs Code Status:   Code Status: Full Code Family Communication: Husband at bedside Disposition Plan: Discharge possibly in 24 hours if baseline dyspnea and pain managed   Consultants:   Medical oncology  Palliative care medicine  Procedures:   Thoracentesis  Antimicrobials:  None    Subjective: Right sided chest pain and "clicking" sensation since having her last thoracentesis.  Objective: Vitals:   02/05/19 2319 02/06/19 0603 02/06/19 0951 02/06/19 0955  BP: 115/69 108/64 119/80 120/78  Pulse: 93 91    Resp: 16 16    Temp: 98.6 F (37 C) 98.6 F (37 C)    TempSrc: Oral Oral    SpO2: 93% 100%    Weight:      Height:       No intake or output data in the 24 hours ending 02/06/19 1300 Filed Weights   02/03/19 0356 02/03/19 1643  Weight: 50.8 kg 52.5 kg    Examination:  General exam: Appears calm and comfortable Respiratory system: mild rales on right lower lobe area. Respiratory effort normal. Cardiovascular system: S1 & S2 heard, RRR. No murmurs, rubs, gallops or clicks. Gastrointestinal system: Abdomen is nondistended, soft and tenderness in RUQ. No organomegaly or masses felt. Normal bowel sounds heard. Central nervous system: Alert and oriented. No focal neurological deficits. Extremities: No edema.  No calf tenderness Skin: No cyanosis. No rashes Psychiatry: Judgement and insight  appear normal. Mood & affect appropriate.    Data Reviewed: I have personally reviewed following labs and imaging studies  CBC: Recent Labs  Lab 02/03/19 0411 02/04/19 0528 02/05/19 1409 02/06/19 0524  WBC 1.2* 1.8* 1.2* 0.9*  NEUTROABS 0.6* 1.0*  --   --   HGB 9.8* 8.3* 8.5* 7.7*  HCT 28.9* 25.0* 25.2* 22.8*  MCV 82.8 85.0 84.6 84.4  PLT 16* 34* 26* 19*   Basic Metabolic Panel: Recent Labs  Lab 02/03/19 0411 02/04/19 0528 02/05/19 1409  NA 137 138 137  K 3.1* 3.5 4.3  CL 98 102 99  CO2 28 29 31   GLUCOSE 137* 92 117*  BUN 14 12 15   CREATININE 0.73 0.68 0.57  CALCIUM 8.5* 8.7* 8.7*  MG  --  2.0  --    GFR: Estimated Creatinine Clearance: 59.8 mL/min (by C-G formula based on SCr of 0.57 mg/dL). Liver Function Tests: Recent Labs  Lab 02/03/19 0411 02/05/19 1409  AST 82* 56*  ALT 52* 37  ALKPHOS 381* 326*  BILITOT 1.0 0.7  PROT 6.1* 5.6*  ALBUMIN 3.2* 2.6*   No results for input(s): LIPASE, AMYLASE in the last 168 hours. No results for input(s): AMMONIA in the last 168 hours. Coagulation Profile: Recent Labs  Lab 02/03/19 0411  INR 1.1   Cardiac Enzymes: No results for input(s): CKTOTAL, CKMB, CKMBINDEX, TROPONINI in the last 168 hours. BNP (last 3 results) No results for input(s): PROBNP in the last 8760 hours. HbA1C: No results for input(s): HGBA1C in the last 72 hours. CBG: No results for input(s): GLUCAP in the last 168 hours. Lipid Profile: No results for input(s): CHOL, HDL, LDLCALC, TRIG, CHOLHDL, LDLDIRECT in the last 72 hours. Thyroid Function Tests: No results for input(s): TSH, T4TOTAL, FREET4, T3FREE, THYROIDAB in the last 72 hours. Anemia Panel: No results for input(s): VITAMINB12, FOLATE, FERRITIN, TIBC, IRON, RETICCTPCT in the last 72 hours. Sepsis Labs: No results for input(s): PROCALCITON, LATICACIDVEN in the last 168 hours.  Recent Results (from the past 240 hour(s))  SARS CORONAVIRUS 2 (TAT 6-24 HRS) Nasopharyngeal  Nasopharyngeal Swab     Status: None   Collection Time: 02/03/19  6:32 AM   Specimen: Nasopharyngeal Swab  Result Value Ref Range Status   SARS Coronavirus 2 NEGATIVE NEGATIVE Final    Comment: (NOTE) SARS-CoV-2 target nucleic acids are NOT DETECTED. The SARS-CoV-2 RNA is generally detectable in upper and lower respiratory specimens during the acute phase of infection. Negative results do not preclude SARS-CoV-2 infection, do not rule out co-infections with other pathogens, and should not be used as the sole basis for treatment or other patient management decisions. Negative results must be combined with clinical observations, patient history, and epidemiological information. The expected result is Negative. Fact Sheet for Patients: SugarRoll.be Fact Sheet for Healthcare Providers: https://www.woods-mathews.com/ This test is not yet approved or cleared by the [place] FDA and  has been authorized for detection and/or diagnosis of SARS-CoV-2 by FDA under an Emergency Use Authorization (EUA). This EUA will remain  in effect (meaning this test can be used) for the duration of the COVID-19 declaration under Section 56 4(b)(1) of the Act, 21 U.S.C. section 360bbb-3(b)(1), unless the authorization is terminated or revoked sooner. Performed at [facility], [place] [address] [place], [place] [address]   Gram stain     Status: None   Collection Time: 02/03/19 10:23 AM   Specimen:  Pleura; Body Fluid  Result Value Ref Range Status   Specimen Description PLEURAL  Final   Special Requests NONE  Final   Gram Stain   Final    NO ORGANISMS SEEN WBC PRESENT,BOTH PMN AND MONONUCLEAR CYTOSPIN SMEAR Performed at [facility], [address] [place], [place] [address]    Report Status 02/03/2019 FINAL  Final  Culture, body fluid-bottle     Status: None (Preliminary result)   Collection Time: 02/03/19 10:23 AM   Specimen: Pleura  Result Value  Ref Range Status   Specimen Description PLEURAL  Final   Special Requests BOTTLES DRAWN AEROBIC AND ANAEROBIC 10CC  Final   Culture   Final    NO GROWTH 3 DAYS Performed at [facility], [address] [place], [place] [address]    Report Status PENDING  Incomplete         Radiology Studies: Dg Chest 1 View  Result Date: 02/06/2019 CLINICAL DATA:  Status post right thoracentesis. EXAM: CHEST  1 VIEW COMPARISON:  Yesterday. FINDINGS: Stable mildly enlarged cardiac silhouette and diffusely prominent interstitial markings. Decreased ill-defined oval density in the right lower lung zone. Mild increase in size of a small left pleural effusion. No visible right pleural fluid. No pneumothorax. Stable mild to moderate thoracolumbar scoliosis. IMPRESSION: 1. No pneumothorax following thoracentesis. 2. Mild increase in size of a small left pleural effusion. 3. Decreased ill-defined oval density in the right lower lung zone. Electronically Signed   By: Claudie Revering M.D.   On: 02/06/2019 10:18   Dg Chest 2 View  Result Date: 02/05/2019 CLINICAL DATA:  History of metastatic breast cancer.  Severe pain. EXAM: CHEST - 2 VIEW COMPARISON:  02/03/2019.  Single-view of the chest and CT chest FINDINGS: Right pleural effusion appears mildly increased compared to the prior plain films. Much smaller left pleural effusion is unchanged. No pneumothorax. Extensive reticulonodular opacities throughout both lungs and chronic opacity in the right middle lobe again seen. Heart size is normal. Osseous metastatic disease noted. IMPRESSION: Mild increase in right pleural effusion since the most recent plain film. Small left pleural effusion is unchanged. Stable appearance of chronic right middle lobe opacity and diffuse reticulonodular change. Electronically Signed   By: Inge Rise M.D.   On: 02/05/2019 12:48   US Thoracentesis Asp Pleural Space W/img Guide  Result Date: 02/06/2019 INDICATION: History of breast  cancer. Recurrent right pleural effusion. Request for therapeutic thoracentesis. EXAM: ULTRASOUND GUIDED RIGHT THORACENTESIS MEDICATIONS: None. COMPLICATIONS: None immediate. Postprocedural chest x-ray negative for pneumothorax. PROCEDURE: An ultrasound guided thoracentesis was thoroughly discussed with the patient and questions answered. The benefits, risks, alternatives and complications were also discussed. The patient understands and wishes to proceed with the procedure. Written consent was obtained. Ultrasound was performed to localize and mark an adequate pocket of fluid in the right chest. The area was then prepped and draped in the normal sterile fashion. 1% Lidocaine was used for local anesthesia. Under ultrasound guidance a 6 Fr Safe-T-Centesis catheter was introduced. Thoracentesis was performed. The catheter was removed and a dressing applied. FINDINGS: A total of approximately 500 mL of clear, amber colored fluid was removed. IMPRESSION: Successful ultrasound guided right thoracentesis yielding 500 mL of pleural fluid. Read by: Ascencion Dike PA-C Electronically Signed   By: Markus Daft M.D.   On: 02/06/2019 10:44        Scheduled Meds:  sodium chloride   Intravenous Once   atorvastatin  20 mg Oral QHS   benzonatate  100 mg Oral TID   feeding supplement (ENSURE ENLIVE)  237 mL Oral TID BM   feeding supplement (PRO-STAT SUGAR FREE 64)  30 mL Oral BID   letrozole  2.5 mg Oral Daily   lidocaine       loratadine  10 mg Oral Daily   midodrine  5 mg Oral BID WC   morphine  15 mg Oral Q12H   multivitamin with minerals  1 tablet Oral Daily   pantoprazole  40 mg Oral BID AC   polyethylene glycol  17 g Oral BID   senna-docusate  2 tablet Oral BID   sodium chloride flush  3 mL Intravenous Q12H   traZODone  150 mg Oral QHS   Continuous Infusions:  sodium chloride       LOS: 3 days     Cordelia Poche, MD [facility] 02/06/2019, 1:00 PM  If 7PM-7AM, please  contact night-coverage [email or web address]

## 2019-02-06 NOTE — Progress Notes (Signed)
Pt discharged home with husband in stable condition. Discharge instructions given. Scripts sent to pharmacy of choice. No immediate questions or concerns at this time. Pt discharged from unit via wheelchair.

## 2019-02-06 NOTE — Telephone Encounter (Signed)
Appt made 11/5 at 1130. CXR order placed. Pt aware to arrive early for CXR. Nothing further needed at this time.

## 2019-02-07 ENCOUNTER — Other Ambulatory Visit: Payer: Self-pay

## 2019-02-07 DIAGNOSIS — C50311 Malignant neoplasm of lower-inner quadrant of right female breast: Secondary | ICD-10-CM

## 2019-02-07 DIAGNOSIS — Z170 Estrogen receptor positive status [ER+]: Secondary | ICD-10-CM

## 2019-02-07 NOTE — Discharge Summary (Signed)
Physician Discharge Summary  Jasmine Allen [id number] DOB: Feb 11, 1966 DOA: 02/03/2019  PCP: Aletha Halim., PA-C  Admit date: 02/03/2019 Discharge date: 02/07/2019  Admitted From: Home Disposition: Home  Recommendations for Outpatient Follow-up:  1. Follow up with PCP in 1 week 2. Follow up with oncology 3. Follow up with pulmonology for PleurX catheter placement 4. Please obtain BMP/CBC in one week 5. Please follow up on the following pending results: None  Home Health: None Equipment/Devices: None  Discharge Condition: Stable CODE STATUS: Full code Diet recommendation: Regular diet   Brief/Interim Summary:  Admission HPI written by Rodena Goldmann, DO   HPI: Jasmine Allen is a 53 y.o. female with medical history significant for breast cancer with metastasis, anxiety, dyslipidemia, and GERD who follows with Dr. Lindi Adie for chemotherapy.  She was last seen by him on 10/16 at which time she was told that there is no cure for her condition and that they would aim for palliative care.  She presented to the ED at 3 AM this morning with right anterior and lower chest pain.  She states that the pain is sharp and achy and is worse with movement.  There appears to be a pleuritic component with deep breath and coughing that seems to worsen it and she has had a dry cough for the last few days.  She normally wears 2 L nasal cannula oxygen and does not have any swelling in her lower extremities.  She denies any fevers or chills.  No abdominal pain, nausea or vomiting noted.   ED Course: Stable vital signs noted with soft blood pressure readings but no tachycardia noted on EKG or telemetry.  She is noted to have pancytopenia with WBC of 1.2 and absolute neutrophil count of 600.  Platelet count of 16,000 and hemoglobin 9.8.  Potassium is 3.1.  CT angiogram with no signs of PE despite elevated D-dimer.  There is a large right-sided pleural effusion as well as moderate left pleural  effusion.  EDP had discussed with Dr. Jana Hakim of oncology who had recommended a blood smear as well as palliative consultation and Neupogen administration.   Hospital course:  Bilateral pleural effusions Malignant pleural effusion Patient is s/p thoracentesis on admission. Physical exam suggests reaccumulation. Chest x-ray obtained today shows recurrent effusion. Patient continues to have reaccumulation. Briefly discussed possibility of Pleurx catheter to reduce need for recurrent thoracenteses/hospitalizations on 10/28 and again today. Also discussed with oncology. Repeat thoracentesis yielded 500 mL of fluid. Pulmonology was consulted for consideration of a Pleurx catheter. Not enough fluid to attempt placement so plan for outpatient catheter placement per pulmonology.  Dyspnea Secondary to above. Patient improved but still not at baseline. Significant coughing spells make symptoms worse. CTA negative for PE. Improved with thoracenteses.  Cough Likely related to effusions. Improved significantly.  Pancytopenia Likely secondary to Ibrance. Oncology recommends this be held. Patient received 1 unit of platelets to date. Numbers trending down. No change in management per oncology. Outpatient follow up with labs. Precautions given.  Acute on chronic pain Appears to be secondary to her cancer. ?effusion vs liver pathology with regard to location of pain. Oral regimen adjusted and is controlling pain better. Patient becoming slightly delirious during the night. Required morphine this morning for new chest pain after her thoracentesis. Patient started on morphine regimen with good toleration.  Elevated LFTs/Alkaline phosphatase PET CT suggests possible metastatic disease. Highly concerning for metastatic liver lesions. Patient still has her gallbladder and pain is  persistent in that area. GGT elevated. Suboptimal RUQ ultrasound exam, however, no cholecystitis or ductal pathology  noted.  Metastatic breast cancer On Ibrance and letrozole as an outpatient. Follows with Dr. Lindi Adie. Plan for second opinion at [facility] Xanax as needed  Hyperlipidemia Continue Lipitor  Hypotension Improved.  Discharge Diagnoses:  Active Problems:   Pleural effusion   Palliative care by specialist   Goals of care, counseling/discussion   Atypical chest pain   Metastatic breast cancer [facility])   Cancer related pain    Discharge Instructions  Discharge Instructions    Call MD for:  difficulty breathing, headache or visual disturbances   Complete by: As directed    Call MD for:  redness, tenderness, or signs of infection (pain, swelling, redness, odor or green/yellow discharge around incision site)   Complete by: As directed    Call MD for:  temperature >100.4   Complete by: As directed    Increase activity slowly   Complete by: As directed      Allergies as of 02/06/2019      Reactions   Amoxicillin-pot Clavulanate Other (See Comments)   Big doses cause diarrhea    Erythromycin Rash   Metoclopramide Hcl Other (See Comments)   Restless legs   Sulfonamide Derivatives Rash      Medication List    STOP taking these medications   ALPRAZolam 0.5 MG tablet Commonly known as: XANAX   LORazepam 0.5 MG tablet Commonly known as: ATIVAN   oxyCODONE-acetaminophen 10-325 MG tablet Commonly known as: Percocet     TAKE these medications   albuterol (2.5 MG/3ML) 0.083% nebulizer solution Commonly known as: PROVENTIL Inhale 3 mLs into the lungs every 6 (six) hours as needed for wheezing or shortness of breath (cough).   atorvastatin 20 MG tablet Commonly known as: LIPITOR Take 20 mg by mouth at bedtime.   benzonatate 100 MG capsule Commonly known as: TESSALON Take 1 capsule (100 mg total) by mouth 3 (three) times daily.   Biotin 10000 MCG Tabs Take 10,000 mcg by mouth daily.   bumetanide 1 MG tablet Commonly known  as: BUMEX TAKE 1 TABLET(1 MG) BY MOUTH TWICE DAILY What changed: See the new instructions.   cetirizine 10 MG tablet Commonly known as: ZYRTEC Take 10 mg by mouth daily.   cloNIDine 0.3 MG tablet Commonly known as: CATAPRES Take 0.3 mg by mouth at bedtime.   cyclobenzaprine 10 MG tablet Commonly known as: FLEXERIL Take 10 mg by mouth every 8 (eight) hours as needed for muscle spasms.   hydrOXYzine 10 MG tablet Commonly known as: ATARAX/VISTARIL Take 10-30 mg by mouth at bedtime as needed for itching.   hyoscyamine 0.125 MG SL tablet Commonly known as: LEVSIN SL dissolve 1 tablet under the tongue every 4 hours if needed What changed: See the new instructions.   letrozole 2.5 MG tablet Commonly known as: FEMARA Take 1 tablet (2.5 mg total) by mouth daily.   morphine 15 MG 12 hr tablet Commonly known as: MS CONTIN Take 1 tablet (15 mg total) by mouth every 12 (twelve) hours.   morphine 15 MG tablet Commonly known as: MSIR Take 1 tablet (15 mg total) by mouth every 6 (six) hours as needed for severe pain.   multivitamin with minerals Tabs tablet Take 1 tablet by mouth daily.   pantoprazole 40 MG tablet Commonly known as: PROTONIX Take 1 tablet (40 mg total) by mouth 2 (two) times daily before a meal.  What changed: when to take this   polyethylene glycol 17 g packet Commonly known as: MIRALAX / GLYCOLAX Take 17 g by mouth 2 (two) times daily. Decrease to daily if having watery or multiple loose stools daily.   senna 8.6 MG Tabs tablet Commonly known as: SENOKOT Take 1 tablet (8.6 mg total) by mouth 2 (two) times daily.   traZODone 100 MG tablet Commonly known as: DESYREL Take 150 mg by mouth at bedtime.   Tussionex Pennkinetic ER 10-8 MG/5ML Suer Generic drug: chlorpheniramine-HYDROcodone Take 5 mLs by mouth 2 (two) times daily.      Follow-up Information    Candee Furbish, MD Follow up on 02/13/2019.   Specialty: Pulmonary Disease Why: We will call you  with appt date/time Contact information: [place] [address] [phone]        Aletha Halim., PA-C. Schedule an appointment as soon as possible for a visit in 1 week(s).   Specialty: Family Medicine Why: Hospital follow-up Contact information: [address] [place] [address] [phone]        Nicholas Lose, MD. Go on 02/10/2019.   Specialty: Hematology and Oncology Contact information: [place] [address] [phone]          Allergies  Allergen Reactions   Amoxicillin-Pot Clavulanate Other (See Comments)    Big doses cause diarrhea    Erythromycin Rash   Metoclopramide Hcl Other (See Comments)    Restless legs   Sulfonamide Derivatives Rash    Consultations:  Medical oncology  Interventional radiology  Pulmonology   Procedures/Studies: Dg Chest 1 View  Result Date: 02/06/2019 CLINICAL DATA:  Status post right thoracentesis. EXAM: CHEST  1 VIEW COMPARISON:  Yesterday. FINDINGS: Stable mildly enlarged cardiac silhouette and diffusely prominent interstitial markings. Decreased ill-defined oval density in the right lower lung zone. Mild increase in size of a small left pleural effusion. No visible right pleural fluid. No pneumothorax. Stable mild to moderate thoracolumbar scoliosis. IMPRESSION: 1. No pneumothorax following thoracentesis. 2. Mild increase in size of a small left pleural effusion. 3. Decreased ill-defined oval density in the right lower lung zone. Electronically Signed   By: Claudie Revering M.D.   On: 02/06/2019 10:18   Dg Chest 1 View  Result Date: 02/03/2019 CLINICAL DATA:  Status post thoracentesis. History of breast carcinoma EXAM: CHEST  1 VIEW COMPARISON:  February 03, 2019 chest radiograph and CT angiogram chest February 03, 2019; PET-CT January 09, 2019 FINDINGS: No evident pneumothorax. No appreciable residual pleural effusion on the right. There is a persistent small  pleural effusion on the left. There remains reticular opacity throughout the lungs, concerning for lymphangitic spread of tumor based on prior CT appearance. There is ill-defined hazy opacity in the right lower lobe which may represent a degree of re-expansion pulmonary edema. There is also atelectatic change in the lung bases. Heart is upper normal in size with pulmonary vascularity normal. No appreciable adenopathy evident by radiography. No bone lesions. IMPRESSION: No pneumothorax. Persistent small left pleural effusion. Diffuse reticular opacity concerning for lymphangitic spread of tumor. Stable atelectasis left base. Ill-defined hazy opacity in the right mid lower lung zones may represent a degree of re-expansion pulmonary edema given the recent thoracentesis. Stable cardiac silhouette. Electronically Signed   By: Lowella Grip III M.D.   On: 02/03/2019 10:49   Dg Chest 1 View  Result Date: 01/13/2019 CLINICAL DATA:  Post thoracentesis on the right. History of hypertension. EXAM: CHEST  1 VIEW COMPARISON:  Radiographs earlier today.  PET-CT 01/09/2019. FINDINGS: 1720 hours. The right pleural effusion has decreased in volume. There is no evidence of pneumothorax. Diffuse bilateral interstitial pulmonary opacities are stable. There is improved aeration of the right lung base. A small left pleural effusion remains. IMPRESSION: Decreased right pleural effusion and no evidence of pneumothorax following thoracentesis. Electronically Signed   By: Richardean Sale M.D.   On: 01/13/2019 18:10   Dg Chest 2 View  Result Date: 02/05/2019 CLINICAL DATA:  History of metastatic breast cancer.  Severe pain. EXAM: CHEST - 2 VIEW COMPARISON:  02/03/2019.  Single-view of the chest and CT chest FINDINGS: Right pleural effusion appears mildly increased compared to the prior plain films. Much smaller left pleural effusion is unchanged. No pneumothorax. Extensive reticulonodular opacities throughout both lungs and  chronic opacity in the right middle lobe again seen. Heart size is normal. Osseous metastatic disease noted. IMPRESSION: Mild increase in right pleural effusion since the most recent plain film. Small left pleural effusion is unchanged. Stable appearance of chronic right middle lobe opacity and diffuse reticulonodular change. Electronically Signed   By: Inge Rise M.D.   On: 02/05/2019 12:48   Dg Chest 2 View  Result Date: 01/24/2019 CLINICAL DATA:  Shortness of breath. Pleural effusion. EXAM: CHEST - 2 VIEW COMPARISON:  Chest x-rays dated 01/13/2019 and PET-CT dated 01/09/2019 FINDINGS: There is progression of the infiltrate in the right middle lobe. There is persistent diffuse accentuation of the interstitial markings bilaterally. Heart size and vascularity are normal. Minimal right effusion, unchanged. Small left effusion, slightly more prominent. No acute bone abnormality. Thoracolumbar scoliosis. IMPRESSION: 1. Progressive infiltrate in the right middle lobe. 2. Slight increase in small left pleural effusion. 3. No change in the small right pleural effusion. 4. Persistent diffuse accentuation of the interstitial markings. Electronically Signed   By: Lorriane Shire M.D.   On: 01/24/2019 11:33   Dg Chest 2 View  Result Date: 01/13/2019 CLINICAL DATA:  Worsening shortness of breath over the last month. Generalized abdominal pain. History of metastatic breast cancer. EXAM: CHEST - 2 VIEW COMPARISON:  PET-CT 01/09/2019, chest CT 01/06/2019 and chest radiographs 12/30/2018 and 12/16/2018. FINDINGS: The heart size and mediastinal contours are stable. There are small bilateral pleural effusions which appear unchanged from the recent CT and PET-CT. Coarse interstitial markings are again noted throughout both lungs with increased focal airspace disease at the right lung base, primarily in the middle lobe. There is a thoracolumbar scoliosis. IMPRESSION: Progressive coarsening of the interstitial markings  and right middle lobe airspace disease compared with prior chest radiographs, but without gross change from the most recent PET-CT of 4 days ago. Again, findings may reflect lymphangitic spread of tumor with superimposed inflammation on the right. Electronically Signed   By: Richardean Sale M.D.   On: 01/13/2019 16:27   Ct Chest Wo Contrast  Result Date: 02/06/2019 CLINICAL DATA:  Chest pain. History of metastatic breast cancer. Status post thoracentesis. EXAM: CT CHEST WITHOUT CONTRAST TECHNIQUE: Multidetector CT imaging of the chest was performed following the standard protocol without IV contrast. COMPARISON:  February 03, 2019. FINDINGS: Cardiovascular: No significant vascular findings. Normal heart size. No pericardial effusion. Mediastinum/Nodes: No enlarged mediastinal or axillary lymph nodes. Thyroid gland, trachea, and esophagus demonstrate no significant findings. Lungs/Pleura: Right pleural effusion noted on prior exam is significantly smaller currently status post thoracentesis. Left pleural effusion is not significantly changed. No pneumothorax is noted. Grossly stable reticular opacities are noted throughout  both lungs with focal consolidation seen in the right middle lobe which is slightly decreased compared to prior exam. This may represent lymphangitic spread of malignancy. New focal ground-glass opacity is noted in lingular segment of left upper lobe which may represent inflammation. Upper Abdomen: No acute abnormality. Musculoskeletal: Diffuse sclerotic densities are noted consistent with osseous metastases. IMPRESSION: Right pleural effusion is significantly smaller compared to prior exam, consistent with recent thoracentesis. Left pleural effusion is not significantly changed. Grossly stable reticular densities are noted throughout both lungs with focal consolidation seen in right middle lobe which is slightly improved compared to prior exam, concerning for lymphangitic spread of malignancy.  New focal ground-glass opacity is noted in lingular segment of left upper lobe which may represent focal inflammation. Stable diffuse osseous metastases. Electronically Signed   By: Marijo Conception M.D.   On: 02/06/2019 15:42   Ct Angio Chest Pe W/cm &/or Wo Cm  Result Date: 02/03/2019 CLINICAL DATA:  Breast cancer.  Recent thoracentesis. EXAM: CT ANGIOGRAPHY CHEST WITH CONTRAST TECHNIQUE: Multidetector CT imaging of the chest was performed using the standard protocol during bolus administration of intravenous contrast. Multiplanar CT image reconstructions and MIPs were obtained to evaluate the vascular anatomy. CONTRAST:  147mL OMNIPAQUE IOHEXOL 350 MG/ML SOLN COMPARISON:  PET CT from 01/09/2019 FINDINGS: Cardiovascular: Normal heart size. No pericardial effusion. Normal aorta. No pulmonary artery filling defects. Mediastinum/Nodes: Negative for adenopathy in the chest Lungs/Pleura: Reticulonodular opacities in the upper lungs with chronic consolidation in the right middle lobe, likely lymphangitic tumor based on previous staging. Large right and moderate left pleural effusion which is layering without visible parietal pleural nodularity. No acute airspace disease. Upper Abdomen: Negative in the arterial phase Musculoskeletal: Osseous metastatic disease which is widespread. No acute osseous finding. Review of the MIP images confirms the above findings. IMPRESSION: 1. Large right and moderate left pleural effusion which have progressed from PET 01/09/2019. The fluid is dependent/layering. 2. Negative for pulmonary embolism. 3. Extensive metastatic disease as recently staged. Electronically Signed   By: Monte Fantasia M.D.   On: 02/03/2019 06:01   Nm Pet Image Initial (pi) Skull Base To Thigh  Result Date: 01/10/2019 CLINICAL DATA:  Initial treatment strategy for breast cancer. Pericardial and pleural effusions suspicious for metastasis. EXAM: NUCLEAR MEDICINE PET SKULL BASE TO THIGH TECHNIQUE: 6.1 mCi F-18  FDG was injected intravenously. Full-ring PET imaging was performed from the skull base to thigh after the radiotracer. CT data was obtained and used for attenuation correction and anatomic localization. Fasting blood glucose: 95 mg/dl COMPARISON:  Chest CT 01/06/2019.  Bone scan 12/20/2018. FINDINGS: Mediastinal blood pool activity: SUV max 2.0 Liver activity: SUV max Not applicable. NECK: left level 2 jugular node measures 6 mm and likely corresponds to low-level hypermetabolism, including at 3.2 cm on 21/4. Incidental CT findings: No cervical adenopathy. Left carotid atherosclerosis. CHEST: Low-level hypermetabolism corresponding to upper lung predominant irregular interstitial thickening. More focal area of subpleural right middle lobe consolidation and hypermetabolism, including at a S.U.V. max of 8.2 on 35/8. This is similar in CT appearance 01/06/2019. Hypermetabolism along the course of the left axillary vessels with an equivocal lymph node in this area. Example at 5 mm and a S.U.V. max of 3.3 on 45/4. Otherwise, no thoracic nodal hypermetabolism. Incidental CT findings: Small bilateral pleural effusions are similar. ABDOMEN/PELVIS: Heterogeneous metabolism throughout the liver. The most focal area is within the subcapsular portion of segment 2 (separate from the CT abnormality) and may correspond to vague hypoattenuation.  Example at a S.U.V. max of 4.9 on 94/4. A portal caval node measures 5 mm and a S.U.V. max of 3.6 on 99/4. Incidental CT findings: Normal adrenal glands. Large colonic stool burden. Abdominal aortic atherosclerosis. SKELETON: Heterogeneous marrow hypermetabolism is relatively diffuse. Example area of right sacral and iliac hypermetabolism measures a S.U.V. max of 5.5. Incidental CT findings: Tiny sclerotic lesions throughout the marrow space, new since 10/30/2015. IMPRESSION: 1. Pulmonary hypermetabolism which is suspicious for lymphangitic tumor spread. More confluent right middle lobe  hypermetabolic consolidation, also suspicious for metastatic disease versus less likely infection. 2. Hypermetabolic abdominal, cervical and possible left axillary nodes, suspicious for metastatic disease. 3. Diffuse hepatic hypermetabolism which is nonspecific. More confluent hypermetabolism within the lateral segment left liver lobe, suspicious for metastasis. Correlation with pre and post contrast abdominal MRI should be considered. 4. Diffuse heterogeneous marrow hypermetabolism, which, especially given the appearance of multifocal sclerosis since 10/30/2015, is most consistent with metastatic disease. 5. Small bilateral pleural effusions persist. The previously described pericardial effusion has resolved. 6.  Aortic and branch vessel atherosclerosis. Electronically Signed   By: Abigail Miyamoto M.D.   On: 01/10/2019 11:25   Dg Chest Port 1 View  Result Date: 02/06/2019 CLINICAL DATA:  Chest pain after thoracentesis. EXAM: PORTABLE CHEST 1 VIEW COMPARISON:  Same day. FINDINGS: Stable mild cardiomegaly. No pneumothorax is noted. Stable diffuse interstitial densities are noted throughout both lungs. Small pleural effusions are noted. Stable thoracic scoliosis. IMPRESSION: Stable mild diffuse interstitial densities throughout both lungs. Small bilateral pleural effusions are noted. No pneumothorax is noted. Electronically Signed   By: Marijo Conception M.D.   On: 02/06/2019 13:04   Dg Chest Port 1 View  Result Date: 02/03/2019 CLINICAL DATA:  Right chest pain.  Thoracentesis EXAM: PORTABLE CHEST 1 VIEW COMPARISON:  Ten days ago FINDINGS: Normal size for technique. Reticulonodular density on both sides with small pleural effusions, mildly increased on the right. No pneumothorax. Bony metastatic disease by CT. IMPRESSION: 1. Small bilateral pleural effusion with probable mild increase on the right since study 10 days ago. There may also be right lower lobe atelectasis since prior. 2. 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 Electronically Signed   By: Monte Fantasia M.D.   On: 02/03/2019 04:53   Ir US Chest  Result Date: 01/09/2019 CLINICAL DATA:  53 year old with history of breast cancer. Evaluate for pleural effusion and thoracentesis. EXAM: CHEST ULTRASOUND COMPARISON:  Chest CT 01/06/2019 FINDINGS: There is trace right pleural fluid. No evidence for a safe percutaneous window for thoracentesis. IMPRESSION: Trace right pleural fluid. Thoracentesis not performed due to the small fluid volume. Electronically Signed   By: Markus Daft M.D.   On: 01/09/2019 17:06   US Abdomen Limited Ruq  Result Date: 02/06/2019 CLINICAL DATA:  Right upper quadrant pain EXAM: ULTRASOUND ABDOMEN LIMITED RIGHT UPPER QUADRANT COMPARISON:  None. FINDINGS: Gallbladder: Collapsed gallbladder. No gallstones. Negative sonographic Murphy sign. Gallbladder wall 3.8 mm which is thickened but likely due to collapse gallbladder Common bile duct: Diameter: 3.4 mm. Liver: No focal lesion identified. Within normal limits in parenchymal echogenicity. Portal vein is patent on color Doppler imaging with normal direction of blood flow towards the liver. Other: Small right effusion IMPRESSION: Gallbladder is collapsed. No gallstones are identified. Gallbladder wall appears thickened however this is likely due to incomplete distention. No biliary dilatation or liver abnormality Small right effusion. Electronically Signed   By: Franchot Gallo M.D.   On: 02/06/2019  18:06   US Thoracentesis Asp Pleural Space W/img Guide  Result Date: 02/06/2019 INDICATION: History of breast cancer. Recurrent right pleural effusion. Request for therapeutic thoracentesis. EXAM: ULTRASOUND GUIDED RIGHT THORACENTESIS MEDICATIONS: None. COMPLICATIONS: None immediate. Postprocedural chest x-ray negative for pneumothorax. PROCEDURE: An ultrasound guided thoracentesis was thoroughly discussed with the patient and  questions answered. The benefits, risks, alternatives and complications were also discussed. The patient understands and wishes to proceed with the procedure. Written consent was obtained. Ultrasound was performed to localize and mark an adequate pocket of fluid in the right chest. The area was then prepped and draped in the normal sterile fashion. 1% Lidocaine was used for local anesthesia. Under ultrasound guidance a 6 Fr Safe-T-Centesis catheter was introduced. Thoracentesis was performed. The catheter was removed and a dressing applied. FINDINGS: A total of approximately 500 mL of clear, amber colored fluid was removed. IMPRESSION: Successful ultrasound guided right thoracentesis yielding 500 mL of pleural fluid. Read by: Ascencion Dike PA-C Electronically Signed   By: Markus Daft M.D.   On: 02/06/2019 10:44   US Thoracentesis Asp Pleural Space W/img Guide  Result Date: 02/03/2019 INDICATION: Pleural effusion EXAM: ULTRASOUND GUIDED RIGHT THORACENTESIS MEDICATIONS: None. COMPLICATIONS: None immediate. PROCEDURE: An ultrasound guided thoracentesis was thoroughly discussed with the patient and questions answered. The benefits, risks, alternatives and complications were also discussed. The patient understands and wishes to proceed with the procedure. Written consent was obtained. Ultrasound was performed to localize and mark an adequate pocket of fluid in the right posterior chest. The area was then prepped and draped in the normal sterile fashion. 1% Lidocaine was used for local anesthesia. Under ultrasound guidance a 19 gauge, 7-cm, Yueh catheter was introduced. Thoracentesis was performed. The catheter was removed and a dressing applied. FINDINGS: A total of approximately 600 mL of clear yellow fluid was removed. Samples were sent to the laboratory as requested by the clinical team. IMPRESSION: Successful ultrasound guided right thoracentesis yielding 600 mL of pleural fluid. Electronically Signed   By: Rolm Baptise M.D.   On: 02/03/2019 10:45      Subjective: Pain is better controlled.  Discharge Exam: Vitals:   02/06/19 0955 02/06/19 1326  BP: 120/78 116/83  Pulse:  95  Resp:  20  Temp:  99.1 F (37.3 C)  SpO2:  100%   Vitals:   02/06/19 0603 02/06/19 0951 02/06/19 0955 02/06/19 1326  BP: 108/64 119/80 120/78 116/83  Pulse: 91   95  Resp: 16   20  Temp: 98.6 F (37 C)   99.1 F (37.3 C)  TempSrc: Oral   Oral  SpO2: 100%   100%  Weight:      Height:        General: Pt is alert, awake, not in acute distress Cardiovascular: RRR, S1/S2 +, no rubs, no gallops Respiratory: CTA bilaterally, no wheezing, no rhonchi Abdominal: Soft, NT, ND, bowel sounds + Extremities: no edema, no cyanosis    The results of significant diagnostics from this hospitalization (including imaging, microbiology, ancillary and laboratory) are listed below for reference.     Microbiology: Recent Results (from the past 240 hour(s))  SARS CORONAVIRUS 2 (TAT 6-24 HRS) Nasopharyngeal Nasopharyngeal Swab     Status: None   Collection Time: 02/03/19  6:32 AM   Specimen: Nasopharyngeal Swab  Result Value Ref Range Status   SARS Coronavirus 2 NEGATIVE NEGATIVE Final    Comment: (NOTE) SARS-CoV-2 target nucleic acids are NOT DETECTED. The SARS-CoV-2 RNA is generally detectable in  upper and lower respiratory specimens during the acute phase of infection. Negative results do not preclude SARS-CoV-2 infection, do not rule out co-infections with other pathogens, and should not be used as the sole basis for treatment or other patient management decisions. Negative results must be combined with clinical observations, patient history, and epidemiological information. The expected result is Negative. Fact Sheet for Patients: SugarRoll.be Fact Sheet for Healthcare Providers: https://www.woods-mathews.com/ This test is not yet approved or cleared by the [place]  FDA and  has been authorized for detection and/or diagnosis of SARS-CoV-2 by FDA under an Emergency Use Authorization (EUA). This EUA will remain  in effect (meaning this test can be used) for the duration of the COVID-19 declaration under Section 56 4(b)(1) of the Act, 21 U.S.C. section 360bbb-3(b)(1), unless the authorization is terminated or revoked sooner. Performed at [facility], [place] [address], [place], [place] [address]   Gram stain     Status: None   Collection Time: 02/03/19 10:23 AM   Specimen: Pleura; Body Fluid  Result Value Ref Range Status   Specimen Description PLEURAL  Final   Special Requests NONE  Final   Gram Stain   Final    NO ORGANISMS SEEN WBC PRESENT,BOTH PMN AND MONONUCLEAR CYTOSPIN SMEAR Performed at [facility], [address] [place], [place] [address]    Report Status 02/03/2019 FINAL  Final  Culture, body fluid-bottle     Status: None (Preliminary result)   Collection Time: 02/03/19 10:23 AM   Specimen: Pleura  Result Value Ref Range Status   Specimen Description PLEURAL  Final   Special Requests BOTTLES DRAWN AEROBIC AND ANAEROBIC 10CC  Final   Culture   Final    NO GROWTH 4 DAYS Performed at [facility], [address] [place], [place] [address]    Report Status PENDING  Incomplete     Labs: BNP (last 3 results) No results for input(s): BNP in the last 8760 hours. Basic Metabolic Panel: Recent Labs  Lab 02/03/19 0411 02/04/19 0528 02/05/19 1409  NA 137 138 137  K 3.1* 3.5 4.3  CL 98 102 99  CO2 28 29 31   GLUCOSE 137* 92 117*  BUN 14 12 15   CREATININE 0.73 0.68 0.57  CALCIUM 8.5* 8.7* 8.7*  MG  --  2.0  --    Liver Function Tests: Recent Labs  Lab 02/03/19 0411 02/05/19 1409  AST 82* 56*  ALT 52* 37  ALKPHOS 381* 326*  BILITOT 1.0 0.7  PROT 6.1* 5.6*  ALBUMIN 3.2* 2.6*   No results for input(s): LIPASE, AMYLASE in the last 168 hours. No results for input(s): AMMONIA in the last 168 hours. CBC: Recent Labs    Lab 02/03/19 0411 02/04/19 0528 02/05/19 1409 02/06/19 0524  WBC 1.2* 1.8* 1.2* 0.9*  NEUTROABS 0.6* 1.0*  --   --   HGB 9.8* 8.3* 8.5* 7.7*  HCT 28.9* 25.0* 25.2* 22.8*  MCV 82.8 85.0 84.6 84.4  PLT 16* 34* 26* 19*   Cardiac Enzymes: No results for input(s): CKTOTAL, CKMB, CKMBINDEX, TROPONINI in the last 168 hours. BNP: Invalid input(s): POCBNP CBG: No results for input(s): GLUCAP in the last 168 hours. D-Dimer No results for input(s): DDIMER in the last 72 hours. Hgb A1c No results for input(s): HGBA1C in the last 72 hours. Lipid Profile No results for input(s): CHOL, HDL, LDLCALC, TRIG, CHOLHDL, LDLDIRECT in the last 72 hours. Thyroid function studies No results for input(s): TSH, T4TOTAL, T3FREE, THYROIDAB in the  last 72 hours.  Invalid input(s): FREET3 Anemia work up No results for input(s): VITAMINB12, FOLATE, FERRITIN, TIBC, IRON, RETICCTPCT in the last 72 hours. Urinalysis    Component Value Date/Time   COLORURINE YELLOW 11/19/2016 1604   APPEARANCEUR CLEAR 11/19/2016 1604   LABSPEC 1.018 11/19/2016 1604   PHURINE 6.0 11/19/2016 1604   GLUCOSEU NEGATIVE 11/19/2016 1604   HGBUR NEGATIVE 11/19/2016 1604   BILIRUBINUR NEGATIVE 11/19/2016 [place] 11/19/2016 1604   PROTEINUR NEGATIVE 11/19/2016 1604   UROBILINOGEN 0.2 12/28/2013 1030   NITRITE NEGATIVE 11/19/2016 1604   LEUKOCYTESUR NEGATIVE 11/19/2016 1604   Sepsis Labs Invalid input(s): PROCALCITONIN,  WBC,  LACTICIDVEN Microbiology Recent Results (from the past 240 hour(s))  SARS CORONAVIRUS 2 (TAT 6-24 HRS) Nasopharyngeal Nasopharyngeal Swab     Status: None   Collection Time: 02/03/19  6:32 AM   Specimen: Nasopharyngeal Swab  Result Value Ref Range Status   SARS Coronavirus 2 NEGATIVE NEGATIVE Final    Comment: (NOTE) SARS-CoV-2 target nucleic acids are NOT DETECTED. The SARS-CoV-2 RNA is generally detectable in upper and lower respiratory specimens during the acute phase of  infection. Negative results do not preclude SARS-CoV-2 infection, do not rule out co-infections with other pathogens, and should not be used as the sole basis for treatment or other patient management decisions. Negative results must be combined with clinical observations, patient history, and epidemiological information. The expected result is Negative. Fact Sheet for Patients: SugarRoll.be Fact Sheet for Healthcare Providers: https://www.woods-mathews.com/ This test is not yet approved or cleared by the [place] FDA and  has been authorized for detection and/or diagnosis of SARS-CoV-2 by FDA under an Emergency Use Authorization (EUA). This EUA will remain  in effect (meaning this test can be used) for the duration of the COVID-19 declaration under Section 56 4(b)(1) of the Act, 21 U.S.C. section 360bbb-3(b)(1), unless the authorization is terminated or revoked sooner. Performed at [facility], [place] [address] [place], [place] [address]   Gram stain     Status: None   Collection Time: 02/03/19 10:23 AM   Specimen: Pleura; Body Fluid  Result Value Ref Range Status   Specimen Description PLEURAL  Final   Special Requests NONE  Final   Gram Stain   Final    NO ORGANISMS SEEN WBC PRESENT,BOTH PMN AND MONONUCLEAR CYTOSPIN SMEAR Performed at [facility], [address] [place], [place] [address]    Report Status 02/03/2019 FINAL  Final  Culture, body fluid-bottle     Status: None (Preliminary result)   Collection Time: 02/03/19 10:23 AM   Specimen: Pleura  Result Value Ref Range Status   Specimen Description PLEURAL  Final   Special Requests BOTTLES DRAWN AEROBIC AND ANAEROBIC 10CC  Final   Culture   Final    NO GROWTH 4 DAYS Performed at [facility] [address], [place], [place] [address]    Report Status PENDING  Incomplete     Time coordinating discharge: 35 minutes  SIGNED:   Cordelia Poche, MD [facility] 02/07/2019, 5:27 PM

## 2019-02-07 NOTE — Telephone Encounter (Signed)
Oral Oncology Pharmacist Encounter  I called [facility] at [phone] to follow-up on Ibrance (palbociclib) 75mg  tablet prescription. Leslee Home has shipped to patient and is planned to be delivered today. Copayment $0.  Johny Drilling, PharmD, BCPS, BCOP  02/07/2019 2:38 PM [facility] Oncology [facility] [phone]

## 2019-02-08 LAB — CULTURE, BODY FLUID W GRAM STAIN -BOTTLE: Culture: NO GROWTH

## 2019-02-09 NOTE — Progress Notes (Signed)
Patient Care Team: Aletha Halim., PA-C as PCP - General (Family Medicine) Nicholas Lose, MD as Consulting Physician (Hematology and Oncology) Kyung Rudd, MD as Consulting Physician (Radiation Oncology) Rolm Bookbinder, MD as Consulting Physician (General Surgery)  DIAGNOSIS:    ICD-10-CM   1. Malignant neoplasm of lower-inner quadrant of right breast of female, estrogen receptor positive ([place])  C50.311 CBC with Differential ([place] Only)   Z17.0 [place] ([place] only)    Sample to Blood Bank    SUMMARY OF ONCOLOGIC HISTORY: Oncology History  Malignant neoplasm of lower-inner quadrant of right breast of female, estrogen receptor positive ([place])  08/16/2017 Initial Diagnosis   Right lumpectomy: 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  High risk   08/16/2017 Cancer Staging   Staging form: Breast, AJCC 8th Edition - Pathologic stage from 08/16/2017: Stage IA (pT1c, pN0, cM0, G2, ER+, PR+, HER2-, Oncotype DX score: 31) - Signed by Gardenia Phlegm, NP on 09/12/2018   09/25/2017 - 02/05/2018 Chemotherapy   Adjuvant chemotherapy with dose dense Adriamycin and Cytoxan x4 followed by Taxol weekly x12    03/11/2018 - 04/25/2018 Radiation Therapy   Adjuvant XRT   01/09/2019 PET scan   Pulmonary hypermetabolic and consistent with lymphangitic tumor spread, confluent right middle lobe consolidation, hypermetabolic abdominal, cervical and left axillary lymph nodes, diffuse liver hypermetabolic some nonspecific.  Diffuse bone marrow hypermetabolism worrisome for metastatic disease.  Prior pericardial effusion resolved.   01/16/2019 Relapse/Recurrence   Pleural effusion: Thoracentesis revealed metastatic adenocarcinoma breast primary ER 75%, PR  50%, HER-2 -1+ Bone marrow biopsy positive for metastatic breast cancer     CHIEF COMPLIANT: Follow-up of metastatic breast cancer  INTERVAL HISTORY: Jasmine Allen is a 53 y.o. 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 She is currently on treatment with letrozole and Ibrance. She was admitted from 10/26-10/29 after presenting to the ED with RUQ abdominal pain and rib pain. CT showed a large right-sided pleural effusion and moderate left pleural effusion. She underwent a right thoracentesis yielding 600cc of pleural fluid. She presents to the clinic today for follow-up.   REVIEW OF SYSTEMS:   Constitutional: Denies fevers, chills or abnormal weight loss Eyes: Denies blurriness of vision Ears, nose, mouth, throat, and face: Denies mucositis or sore throat Respiratory: Shortness of breath requiring oxygen 24/7 Cardiovascular: Denies palpitation, chest discomfort Gastrointestinal: Constipation, right upper quadrant abdominal pain Skin: Denies abnormal skin rashes Lymphatics: Denies new lymphadenopathy or easy bruising Neurological: Denies numbness, tingling or new weaknesses Behavioral/Psych: Mood is stable, no new changes  Extremities: No lower extremity edema Breast: denies any pain or lumps or nodules in either breasts All other systems were reviewed with the patient and are negative.  I have reviewed the past medical history, past surgical history, social history and family history with the patient and they are unchanged from previous note.  ALLERGIES:  is allergic to amoxicillin-pot clavulanate; erythromycin; metoclopramide hcl; and sulfonamide derivatives.  MEDICATIONS:  Current Outpatient Medications  Medication Sig Dispense Refill   albuterol (PROVENTIL) (2.5 MG/3ML) 0.083% nebulizer solution Inhale 3 mLs into the lungs every 6 (six) hours as needed for wheezing or shortness of breath (cough). 360 mL 0  ALPRAZolam (XANAX) 0.25 MG tablet Take 1 tablet (0.25 mg total) by mouth at bedtime as needed for anxiety. 30 tablet 0   atorvastatin (LIPITOR) 20 MG tablet Take 20 mg by mouth at bedtime.      benzonatate (TESSALON) 100 MG capsule Take 1 capsule (100 mg total) by mouth 3 (three) times daily. 90 capsule 0   Biotin 10000 MCG TABS Take 10,000 mcg by mouth daily.      bumetanide (BUMEX) 1 MG tablet TAKE 1 TABLET(1 MG) BY MOUTH TWICE DAILY (Patient taking differently: Take 1 mg by mouth 2 (two) times daily. ) 60 tablet 2   cetirizine (ZYRTEC) 10 MG tablet Take 10 mg by mouth daily.     chlorpheniramine-HYDROcodone (TUSSIONEX PENNKINETIC ER) 10-8 MG/5ML SUER Take 5 mLs by mouth 2 (two) times daily.     cyclobenzaprine (FLEXERIL) 10 MG tablet Take 10 mg by mouth every 8 (eight) hours as needed for muscle spasms.  1   hydrOXYzine (ATARAX/VISTARIL) 10 MG tablet Take 10-30 mg by mouth at bedtime as needed for itching.   0   hyoscyamine (LEVSIN SL) 0.125 MG SL tablet dissolve 1 tablet under the tongue every 4 hours if needed (Patient taking differently: Take 0.125 mg by mouth every 4 (four) hours as needed for cramping. ) 30 tablet 0   letrozole (FEMARA) 2.5 MG tablet Take 1 tablet (2.5 mg total) by mouth daily. 90 tablet 3   LORazepam (ATIVAN) 0.5 MG tablet Take 1 tablet (0.5 mg total) by mouth every 8 (eight) hours. 30 tablet 0   morphine (MS CONTIN) 15 MG 12 hr tablet Take 1 tablet (15 mg total) by mouth every 12 (twelve) hours. 60 tablet 0   morphine (MSIR) 15 MG tablet Take 1 tablet (15 mg total) by mouth every 6 (six) hours as needed for severe pain. 60 tablet 0   Multiple Vitamin (MULITIVITAMIN WITH MINERALS) TABS Take 1 tablet by mouth daily.     palbociclib (IBRANCE) 75 MG tablet Take 75 mg by mouth daily. Take for 21 days on, 7 days off, repeat every 28 days.     pantoprazole (PROTONIX) 40 MG tablet Take 1 tablet (40 mg total) by mouth 2 (two) times daily before a meal. (Patient  taking differently: Take 40 mg by mouth 2 (two) times daily. ) 60 tablet 6   polyethylene glycol (MIRALAX / GLYCOLAX) 17 g packet Take 17 g by mouth 2 (two) times daily. Decrease to daily if having watery or multiple loose stools daily. 30 each 0   senna (SENOKOT) 8.6 MG TABS tablet Take 1 tablet (8.6 mg total) by mouth 2 (two) times daily. 60 tablet 0   traZODone (DESYREL) 100 MG tablet Take 150 mg by mouth at bedtime.      No current facility-administered medications for this visit.     PHYSICAL EXAMINATION: ECOG PERFORMANCE STATUS: 2 - Symptomatic, <50% confined to bed  Vitals:   02/10/19 1150  BP: 133/74  Pulse: 99  Resp: 18  Temp: 98.3 F (36.8 C)  SpO2: 100%   Filed Weights   02/10/19 1150  Weight: 116 lb (52.6 kg)    GENERAL: alert, no distress and comfortable SKIN: skin color, texture, turgor are normal, no rashes or significant lesions EYES: normal, Conjunctiva are pink and non-injected, sclera clear OROPHARYNX: no exudate, no erythema and lips, buccal mucosa, and tongue normal  NECK: supple, thyroid normal size, non-tender, without nodularity LYMPH: no palpable lymphadenopathy in the cervical, axillary or inguinal LUNGS: clear  to auscultation and percussion with normal breathing effort HEART: regular rate & rhythm and no murmurs and no lower extremity edema ABDOMEN: abdomen soft, non-tender and normal bowel sounds MUSCULOSKELETAL: no cyanosis of digits and no clubbing  NEURO: alert & oriented x 3 with fluent speech, no focal motor/sensory deficits EXTREMITIES: No lower extremity edema  LABORATORY DATA:  I have reviewed the data as listed CMP Latest Ref Rng & Units 02/10/2019 02/05/2019 02/04/2019  Glucose 70 - 99 mg/dL 207(H) 117(H) 92  BUN 6 - 20 mg/dL 12 15 12   Creatinine 0.44 - 1.00 mg/dL 0.83 0.57 0.68  Sodium 135 - 145 mmol/L 135 137 138  Potassium 3.5 - 5.1 mmol/L 3.2(L) 4.3 3.5  Chloride 98 - 111 mmol/L 92(L) 99 102  CO2 22 - 32 mmol/L 31 31 29     Calcium 8.9 - 10.3 mg/dL 8.8(L) 8.7(L) 8.7(L)  Total Protein 6.5 - 8.1 g/dL 6.5 5.6(L) -  Total Bilirubin 0.3 - 1.2 mg/dL 1.1 0.7 -  Alkaline Phos 38 - 126 U/L 480(H) 326(H) -  AST 15 - 41 U/L 83(H) 56(H) -  ALT 0 - 44 U/L 48(H) 37 -    Lab Results  Component Value Date   WBC 1.6 (L) 02/10/2019   HGB 8.4 (L) 02/10/2019   HCT 24.0 (L) 02/10/2019   MCV 80.0 02/10/2019   PLT 14 (L) 02/10/2019   NEUTROABS 0.7 (L) 02/10/2019    ASSESSMENT & PLAN:  Malignant neoplasm of lower-inner quadrant of right breast of female, estrogen receptor positive ([facility]) 08/16/2017:Right lumpectomy: Grade 2 IDC 1.5 cm, with DCIS and necrosis, 0/3 lymph nodes negative, ER 95%, PR 30%, HER-2 negative ratio 1.14, Ki-67 40%, T1CN0 stage Ia; resection of the margin 09/11/2017: Benign  01/09/2019: PET CT scan:Pulmonary hypermetabolic and consistent with lymphangitic tumor spread, confluent right middle lobe consolidation, hypermetabolic abdominal, cervical and left axillary lymph nodes, diffuse liver hypermetabolic some nonspecific.  Diffuse bone marrow hypermetabolism worrisome for metastatic disease.  Prior pericardial effusion resolved.  01/16/2019:Pleural effusion: Thoracentesis revealed metastatic adenocarcinoma breast primary ER 75%, PR 50%, HER-2 -1+ Bone marrow biopsy positive for metastatic breast cancer -------------------------------------------------------------------------------------------------------------------------------------------------------------- Current treatment: Letrozole 2.5 mg daily started 01/15/2019, Ibrance started 01/24/2019 Hospitalization: 02/03/19- 02/07/19 Malignant Pleural effusion S/P thoracentesis Plan:  1. Pleurex catheter placement as outpatient: Patient has appointment to see Dr. Rosana Hoes this Thursday.  If the plan is to do the Pleurx catheter, we can give her platelet transfusion before her procedure. 2. Dec Ibrance to 75 mg: I discussed about starting her today on this even though  her blood counts are not adequate with an ANC of 0.7 and a platelet count of 19.  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  Patient understands the risks of taking Ibrance with low blood counts but she is willing to give it a try.  We will have to check her blood counts twice a week.  I will see her next Thursday after her appointment with pulmonary to recheck her labs.  Lab review: Hemoglobin 8.4, ANC 0.7, platelets 14  Right upper quadrant abdominal pain with ultrasound abdomen showing thickened gallbladder.  Alkaline phos was elevated which could be related to bone metastases versus gallbladder issues.  I will discuss the case with the Dr. Donne Hazel. I renewed her prescription for MS Contin 15 twice daily and MSIR.  She has oxycodone as well.  She will decide which one works  better and we will review that in the future.  RTC in 02/13/2019 with labs and follow-up   Orders Placed This Encounter  Procedures   CBC with Differential ([place] Only)    Standing Status:   Standing    Number of Occurrences:   20    Standing Expiration Date:   02/10/2020   CMP ([place] only)    Standing Status:   Standing    Number of Occurrences:   20    Standing Expiration Date:   02/10/2020   Sample to Blood Bank    Standing Status:   Standing    Number of Occurrences:   20    Standing Expiration Date:   02/10/2020   The patient has a good understanding of the overall plan. she agrees with it. she will call with any problems that may develop before the next visit here.  Nicholas Lose, MD 02/10/2019  Julious Oka Dorshimer am acting as scribe for Dr. Nicholas Lose.  I have reviewed the above documentation for accuracy and completeness, and I agree with the above.

## 2019-02-10 ENCOUNTER — Other Ambulatory Visit: Payer: Self-pay

## 2019-02-10 ENCOUNTER — Inpatient Hospital Stay: Payer: 59 | Attending: Adult Health

## 2019-02-10 ENCOUNTER — Inpatient Hospital Stay (HOSPITAL_BASED_OUTPATIENT_CLINIC_OR_DEPARTMENT_OTHER): Payer: 59 | Admitting: Hematology and Oncology

## 2019-02-10 DIAGNOSIS — Z923 Personal history of irradiation: Secondary | ICD-10-CM | POA: Diagnosis not present

## 2019-02-10 DIAGNOSIS — R748 Abnormal levels of other serum enzymes: Secondary | ICD-10-CM | POA: Diagnosis not present

## 2019-02-10 DIAGNOSIS — Z170 Estrogen receptor positive status [ER+]: Secondary | ICD-10-CM | POA: Insufficient documentation

## 2019-02-10 DIAGNOSIS — D72819 Decreased white blood cell count, unspecified: Secondary | ICD-10-CM | POA: Insufficient documentation

## 2019-02-10 DIAGNOSIS — Z79899 Other long term (current) drug therapy: Secondary | ICD-10-CM | POA: Diagnosis not present

## 2019-02-10 DIAGNOSIS — C7951 Secondary malignant neoplasm of bone: Secondary | ICD-10-CM | POA: Diagnosis present

## 2019-02-10 DIAGNOSIS — C50311 Malignant neoplasm of lower-inner quadrant of right female breast: Secondary | ICD-10-CM | POA: Diagnosis present

## 2019-02-10 DIAGNOSIS — Z5111 Encounter for antineoplastic chemotherapy: Secondary | ICD-10-CM | POA: Insufficient documentation

## 2019-02-10 DIAGNOSIS — D649 Anemia, unspecified: Secondary | ICD-10-CM | POA: Insufficient documentation

## 2019-02-10 DIAGNOSIS — C773 Secondary and unspecified malignant neoplasm of axilla and upper limb lymph nodes: Secondary | ICD-10-CM | POA: Diagnosis not present

## 2019-02-10 DIAGNOSIS — J910 Malignant pleural effusion: Secondary | ICD-10-CM | POA: Insufficient documentation

## 2019-02-10 DIAGNOSIS — Z79811 Long term (current) use of aromatase inhibitors: Secondary | ICD-10-CM | POA: Diagnosis not present

## 2019-02-10 DIAGNOSIS — R1011 Right upper quadrant pain: Secondary | ICD-10-CM | POA: Insufficient documentation

## 2019-02-10 DIAGNOSIS — C770 Secondary and unspecified malignant neoplasm of lymph nodes of head, face and neck: Secondary | ICD-10-CM | POA: Diagnosis not present

## 2019-02-10 LAB — CBC WITH DIFFERENTIAL (CANCER CENTER ONLY)
Abs Immature Granulocytes: 0 10*3/uL (ref 0.00–0.07)
Band Neutrophils: 2 %
Basophils Absolute: 0 10*3/uL (ref 0.0–0.1)
Basophils Relative: 2 %
Eosinophils Absolute: 0 10*3/uL (ref 0.0–0.5)
Eosinophils Relative: 0 %
HCT: 24 % — ABNORMAL LOW (ref 36.0–46.0)
Hemoglobin: 8.4 g/dL — ABNORMAL LOW (ref 12.0–15.0)
Lymphocytes Relative: 51 %
Lymphs Abs: 0.8 10*3/uL (ref 0.7–4.0)
MCH: 28 pg (ref 26.0–34.0)
MCHC: 35 g/dL (ref 30.0–36.0)
MCV: 80 fL (ref 80.0–100.0)
Metamyelocytes Relative: 1 %
Monocytes Absolute: 0.1 10*3/uL (ref 0.1–1.0)
Monocytes Relative: 5 %
Neutro Abs: 0.7 10*3/uL — ABNORMAL LOW (ref 1.7–7.7)
Neutrophils Relative %: 39 %
Platelet Count: 14 10*3/uL — ABNORMAL LOW (ref 150–400)
RBC: 3 MIL/uL — ABNORMAL LOW (ref 3.87–5.11)
RDW: 13.6 % (ref 11.5–15.5)
WBC Count: 1.6 10*3/uL — ABNORMAL LOW (ref 4.0–10.5)
nRBC: 1.3 % — ABNORMAL HIGH (ref 0.0–0.2)

## 2019-02-10 LAB — CMP (CANCER CENTER ONLY)
ALT: 48 U/L — ABNORMAL HIGH (ref 0–44)
AST: 83 U/L — ABNORMAL HIGH (ref 15–41)
Albumin: 2.8 g/dL — ABNORMAL LOW (ref 3.5–5.0)
Alkaline Phosphatase: 480 U/L — ABNORMAL HIGH (ref 38–126)
Anion gap: 12 (ref 5–15)
BUN: 12 mg/dL (ref 6–20)
CO2: 31 mmol/L (ref 22–32)
Calcium: 8.8 mg/dL — ABNORMAL LOW (ref 8.9–10.3)
Chloride: 92 mmol/L — ABNORMAL LOW (ref 98–111)
Creatinine: 0.83 mg/dL (ref 0.44–1.00)
GFR, Est AFR Am: >60 mL/min (ref >60)
GFR, Estimated: >60 mL/min (ref >60)
Glucose, Bld: 207 mg/dL — ABNORMAL HIGH (ref 70–99)
Potassium: 3.2 mmol/L — ABNORMAL LOW (ref 3.5–5.1)
Sodium: 135 mmol/L (ref 135–145)
Total Bilirubin: 1.1 mg/dL (ref 0.3–1.2)
Total Protein: 6.5 g/dL (ref 6.5–8.1)

## 2019-02-10 MED ORDER — LORAZEPAM 0.5 MG PO TABS: 0.5000 mg | ORAL_TABLET | Freq: Three times a day (TID) | ORAL | 0 refills | Status: DC

## 2019-02-10 MED ORDER — ALPRAZOLAM 0.25 MG PO TABS: 0.2500 mg | ORAL_TABLET | Freq: Every evening | ORAL | 0 refills | Status: DC | PRN

## 2019-02-10 MED ORDER — MORPHINE SULFATE 15 MG PO TABS: 15.0000 mg | ORAL_TABLET | Freq: Four times a day (QID) | ORAL | 0 refills | Status: DC | PRN

## 2019-02-10 MED ORDER — MORPHINE SULFATE ER 15 MG PO TBCR: 15.0000 mg | EXTENDED_RELEASE_TABLET | Freq: Two times a day (BID) | ORAL | 0 refills | Status: DC

## 2019-02-10 NOTE — Assessment & Plan Note (Signed)
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  Diffuse bone marrow hypermetabolism worrisome for metastatic disease.  Prior pericardial effusion resolved.  01/16/2019:Pleural effusion: Thoracentesis revealed metastatic adenocarcinoma breast primary ER 75%, PR 50%, HER-2 -1+ Bone marrow biopsy positive for metastatic breast cancer -------------------------------------------------------------------------------------------------------------------------------------------------------------- Current treatment: Letrozole 2.5 mg daily started 01/15/2019, Ibrance started 01/24/2019 Hospitalization: 02/03/19- 02/07/19 Malignant Pleural effusion S/P thoracentesis Plan:  1. Pleurex catheter placement as outpatient 2. Dec Ibrance to 75 mg  Lab review:  RTC in 1 week

## 2019-02-12 ENCOUNTER — Telehealth: Payer: Self-pay | Admitting: Hematology and Oncology

## 2019-02-12 NOTE — Progress Notes (Signed)
Patient Care Team: Aletha Halim., PA-C as PCP - General (Family Medicine) Nicholas Lose, MD as Consulting Physician (Hematology and Oncology) Kyung Rudd, MD as Consulting Physician (Radiation Oncology) Rolm Bookbinder, MD as Consulting Physician (General Surgery)  DIAGNOSIS:    ICD-10-CM   1. Malignant neoplasm of lower-inner quadrant of right breast of female, estrogen receptor positive ([place])  C50.311    Z17.0     SUMMARY OF ONCOLOGIC HISTORY: Oncology History  Malignant neoplasm of lower-inner quadrant of right breast of female, estrogen receptor positive ([place])  08/16/2017 Initial Diagnosis   Right lumpectomy: 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  High risk   08/16/2017 Cancer Staging   Staging form: Breast, AJCC 8th Edition - Pathologic stage from 08/16/2017: Stage IA (pT1c, pN0, cM0, G2, ER+, PR+, HER2-, Oncotype DX score: 31) - Signed by Gardenia Phlegm, NP on 09/12/2018   09/25/2017 - 02/05/2018 Chemotherapy   Adjuvant chemotherapy with dose dense Adriamycin and Cytoxan x4 followed by Taxol weekly x12    03/11/2018 - 04/25/2018 Radiation Therapy   Adjuvant XRT   01/09/2019 PET scan   Pulmonary hypermetabolic and consistent with lymphangitic tumor spread, confluent right middle lobe consolidation, hypermetabolic abdominal, cervical and left axillary lymph nodes, diffuse liver hypermetabolic some nonspecific.  Diffuse bone marrow hypermetabolism worrisome for metastatic disease.  Prior pericardial effusion resolved.   01/16/2019 Relapse/Recurrence   Pleural effusion: Thoracentesis revealed metastatic adenocarcinoma breast primary ER 75%, PR 50%, HER-2 -1+ Bone marrow biopsy positive for metastatic breast cancer     CHIEF  COMPLIANT: Follow-up of metastatic breast cancer  INTERVAL HISTORY: Jasmine Allen is a 53 y.o. 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 She is currently on treatment with letrozole and Ibrance. She presents to the clinic today for follow-up and a toxicity check.  She was feeling more tired and fatigued lately.  She also threw up once.  REVIEW OF SYSTEMS:   Constitutional: More tiredness Eyes: Denies blurriness of vision Ears, nose, mouth, throat, and face: Denies mucositis or sore throat Respiratory: Shortness of breath requiring oxygen by nasal cannula Cardiovascular: Denies palpitation, chest discomfort Gastrointestinal: Denies nausea, heartburn or change in bowel habits Skin: Denies abnormal skin rashes Lymphatics: Denies new lymphadenopathy or easy bruising Neurological: Denies numbness, tingling or new weaknesses Behavioral/Psych: Mood is stable, no new changes  Extremities: No lower extremity edema Breast: denies any pain or lumps or nodules in either breasts All other systems were reviewed with the patient and are negative.  I have reviewed the past medical history, past surgical history, social history and family history with the patient and they are unchanged from previous note.  ALLERGIES:  is allergic to amoxicillin-pot clavulanate; erythromycin; metoclopramide hcl; and sulfonamide derivatives.  MEDICATIONS:  Current Outpatient Medications  Medication Sig Dispense Refill  . albuterol (PROVENTIL) (2.5 MG/3ML) 0.083% nebulizer solution Inhale 3 mLs into the lungs every 6 (six) hours as needed for wheezing or shortness of breath (cough). 360 mL 0  . ALPRAZolam (XANAX) 0.25 MG tablet Take 1 tablet (0.25 mg total) by mouth at bedtime as needed for anxiety. 30 tablet 0  . atorvastatin (LIPITOR) 20 MG tablet Take 20 mg by mouth at bedtime.     Marland Kitchen  benzonatate (TESSALON) 100 MG capsule Take 1 capsule (100 mg total)  by mouth 3 (three) times daily. 90 capsule 0  . Biotin 10000 MCG TABS Take 10,000 mcg by mouth daily.     . bumetanide (BUMEX) 1 MG tablet TAKE 1 TABLET(1 MG) BY MOUTH TWICE DAILY (Patient taking differently: Take 1 mg by mouth 2 (two) times daily. ) 60 tablet 2  . cetirizine (ZYRTEC) 10 MG tablet Take 10 mg by mouth daily.    . chlorpheniramine-HYDROcodone (TUSSIONEX PENNKINETIC ER) 10-8 MG/5ML SUER Take 5 mLs by mouth 2 (two) times daily.    . cyclobenzaprine (FLEXERIL) 10 MG tablet Take 10 mg by mouth every 8 (eight) hours as needed for muscle spasms.  1  . hydrOXYzine (ATARAX/VISTARIL) 10 MG tablet Take 10-30 mg by mouth at bedtime as needed for itching.   0  . hyoscyamine (LEVSIN SL) 0.125 MG SL tablet dissolve 1 tablet under the tongue every 4 hours if needed (Patient taking differently: Take 0.125 mg by mouth every 4 (four) hours as needed for cramping. ) 30 tablet 0  . letrozole (FEMARA) 2.5 MG tablet Take 1 tablet (2.5 mg total) by mouth daily. 90 tablet 3  . LORazepam (ATIVAN) 0.5 MG tablet Take 1 tablet (0.5 mg total) by mouth every 8 (eight) hours. 30 tablet 0  . morphine (MS CONTIN) 15 MG 12 hr tablet Take 1 tablet (15 mg total) by mouth every 12 (twelve) hours. 60 tablet 0  . morphine (MSIR) 15 MG tablet Take 1 tablet (15 mg total) by mouth every 6 (six) hours as needed for severe pain. 60 tablet 0  . Multiple Vitamin (MULITIVITAMIN WITH MINERALS) TABS Take 1 tablet by mouth daily.    . palbociclib (IBRANCE) 75 MG tablet Take 75 mg by mouth daily. Take for 21 days on, 7 days off, repeat every 28 days.    . pantoprazole (PROTONIX) 40 MG tablet Take 1 tablet (40 mg total) by mouth 2 (two) times daily before a meal. (Patient taking differently: Take 40 mg by mouth 2 (two) times daily. ) 60 tablet 6  . polyethylene glycol (MIRALAX / GLYCOLAX) 17 g packet Take 17 g by mouth 2 (two) times daily. Decrease to daily if having watery or multiple loose stools daily. 30 each 0  . senna (SENOKOT)  8.6 MG TABS tablet Take 1 tablet (8.6 mg total) by mouth 2 (two) times daily. 60 tablet 0  . traZODone (DESYREL) 100 MG tablet Take 150 mg by mouth at bedtime.      No current facility-administered medications for this visit.     PHYSICAL EXAMINATION: ECOG PERFORMANCE STATUS: 2 - Symptomatic, <50% confined to bed  Vitals:   02/13/19 1249  BP: 128/77  Pulse: 93  Resp: 17  Temp: 98.9 F (37.2 C)  SpO2: 100%   Filed Weights   02/13/19 1249  Weight: 114 lb 6.4 oz (51.9 kg)    GENERAL: alert, no distress and comfortable SKIN: skin color, texture, turgor are normal, no rashes or significant lesions EYES: normal, Conjunctiva are pink and non-injected, sclera clear OROPHARYNX: no exudate, no erythema and lips, buccal mucosa, and tongue normal  NECK: supple, thyroid normal size, non-tender, without nodularity LYMPH: no palpable lymphadenopathy in the cervical, axillary or inguinal LUNGS: clear to auscultation and percussion with normal breathing effort HEART: regular rate & rhythm and no murmurs and no lower extremity edema ABDOMEN: abdomen soft, non-tender and normal bowel sounds MUSCULOSKELETAL: no cyanosis of digits and no clubbing  NEURO: alert & oriented x 3 with fluent speech, no focal motor/sensory deficits EXTREMITIES: No lower extremity edema  LABORATORY DATA:  I have reviewed the data as listed CMP Latest Ref Rng & Units 02/10/2019 02/05/2019 02/04/2019  Glucose 70 - 99 mg/dL 207(H) 117(H) 92  BUN 6 - 20 mg/dL _0 Creatinine 0.44 - 1.00 mg/dL 0.83 0.57 0.68  Sodium 135 - 145 mmol/L 135 137 138  Potassium 3.5 - 5.1 mmol/L 3.2(L) 4.3 3.5  Chloride 98 - 111 mmol/L 92(L) 99 102  CO2 22 - 32 mmol/L _1 Calcium 8.9 - 10.3 mg/dL 8.8(L) 8.7(L) 8.7(L)  Total Protein 6.5 - 8.1 g/dL 6.5 5.6(L) -  Total Bilirubin 0.3 - 1.2 mg/dL 1.1 0.7 -  Alkaline Phos 38 - 126 U/L 480(H) 326(H) -  AST 15 - 41 U/L 83(H) 56(H) -  ALT 0 - 44 U/L 48(H) 37 -    Lab Results   Component Value Date   WBC 2.1 (L) 02/13/2019   HGB 7.7 (L) 02/13/2019   HCT 22.5 (L) 02/13/2019   MCV 81.5 02/13/2019   PLT 18 (L) 02/13/2019   NEUTROABS PENDING 02/13/2019    ASSESSMENT & PLAN:  Malignant neoplasm of lower-inner quadrant of right breast of female, estrogen receptor positive ([place]) 08/16/2017:Right lumpectomy: Grade 2 IDC 1.5 cm, with DCIS and necrosis, 0/3 lymph nodes negative, ER 95%, PR 30%, HER-2 negative ratio 1.14, Ki-67 40%, T1CN0 stage Ia; resection of the margin 09/11/2017: Benign  01/09/2019: PET CT scan:Pulmonary hypermetabolic and consistent with lymphangitic tumor spread, confluent right middle lobe consolidation, hypermetabolic abdominal, cervical and left axillary lymph nodes, diffuse liver hypermetabolic some nonspecific. Diffuse bone marrow hypermetabolism worrisome for metastatic disease. Prior pericardial effusion resolved.  01/16/2019:Pleural effusion: Thoracentesis revealed metastatic adenocarcinoma breast primary ER 75%, PR 50%, HER-2 -1+ Bone marrow biopsy positive for metastatic breast cancer -------------------------------------------------------------------------------------------------------------------------------------------------------------- Current treatment: Letrozole 2.5 mg daily started 01/15/2019, Ibrance started 01/24/2019 Hospitalization: 02/03/19- 02/07/19 Malignant Pleural effusion S/P thoracentesis Plan:  1. Pleurex catheter placement as outpatient: Patient has appointment to see Dr. Rosana Hoes this Thursday.  If the plan is to do the Pleurx catheter, we can give her platelet transfusion before her procedure. 2. Dec Ibrance to 75 mg: I discussed about starting her today on this even though her blood counts are not adequate with an ANC of 0.7 and a platelet count of 19.  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  Patient understands  the risks of taking Ibrance with low blood counts but she is willing to give it a try.  We will have to check her blood counts twice a week.  Lab review:  WBC count 2.1, hemoglobin 7.7, platelets 18 Plan is to give her 2 units of PRBC this Saturday.  Right upper quadrant abdominal pain with ultrasound abdomen showing thickened gallbladder.  Alkaline phos was elevated I discussed with Dr. Donne Hazel who does not think it is gallbladder etiology. Currently on pain regimen.  Patient will return twice a week for labs and follow-ups.    No orders of the defined types were placed in this encounter.  The patient has a good understanding of the overall plan. she agrees with it. she will call with any problems that may develop before the next visit here.  Nicholas Lose, MD 02/13/2019  Julious Oka Dorshimer am acting as scribe for Dr. Nicholas Lose.  I  have reviewed the above documentation for accuracy and completeness, and I agree with the above.

## 2019-02-12 NOTE — Telephone Encounter (Signed)
R/s appt per 11/4 sch message - unable to reach pt . Left message with appt date and time

## 2019-02-13 ENCOUNTER — Ambulatory Visit (INDEPENDENT_AMBULATORY_CARE_PROVIDER_SITE_OTHER): Payer: 59

## 2019-02-13 ENCOUNTER — Ambulatory Visit (INDEPENDENT_AMBULATORY_CARE_PROVIDER_SITE_OTHER): Payer: 59 | Admitting: Internal Medicine

## 2019-02-13 ENCOUNTER — Telehealth: Payer: Self-pay

## 2019-02-13 ENCOUNTER — Other Ambulatory Visit: Payer: Self-pay

## 2019-02-13 ENCOUNTER — Telehealth: Payer: Self-pay | Admitting: *Deleted

## 2019-02-13 ENCOUNTER — Telehealth: Payer: Self-pay | Admitting: Internal Medicine

## 2019-02-13 ENCOUNTER — Encounter: Payer: Self-pay | Admitting: Internal Medicine

## 2019-02-13 ENCOUNTER — Inpatient Hospital Stay: Payer: 59

## 2019-02-13 ENCOUNTER — Inpatient Hospital Stay (HOSPITAL_BASED_OUTPATIENT_CLINIC_OR_DEPARTMENT_OTHER): Payer: 59 | Admitting: Hematology and Oncology

## 2019-02-13 VITALS — BP 128/80 | HR 96 | Ht 60.0 in | Wt 114.8 lb

## 2019-02-13 DIAGNOSIS — Z170 Estrogen receptor positive status [ER+]: Secondary | ICD-10-CM

## 2019-02-13 DIAGNOSIS — J90 Pleural effusion, not elsewhere classified: Secondary | ICD-10-CM

## 2019-02-13 DIAGNOSIS — D649 Anemia, unspecified: Secondary | ICD-10-CM

## 2019-02-13 DIAGNOSIS — C50311 Malignant neoplasm of lower-inner quadrant of right female breast: Secondary | ICD-10-CM

## 2019-02-13 DIAGNOSIS — Z5111 Encounter for antineoplastic chemotherapy: Secondary | ICD-10-CM | POA: Diagnosis not present

## 2019-02-13 LAB — CBC WITH DIFFERENTIAL (CANCER CENTER ONLY)
Abs Immature Granulocytes: 0.01 10*3/uL (ref 0.00–0.07)
Basophils Absolute: 0 10*3/uL (ref 0.0–0.1)
Basophils Relative: 1 %
Eosinophils Absolute: 0 10*3/uL (ref 0.0–0.5)
Eosinophils Relative: 0 %
HCT: 22.5 % — ABNORMAL LOW (ref 36.0–46.0)
Hemoglobin: 7.7 g/dL — ABNORMAL LOW (ref 12.0–15.0)
Immature Granulocytes: 1 %
Lymphocytes Relative: 25 %
Lymphs Abs: 0.5 10*3/uL — ABNORMAL LOW (ref 0.7–4.0)
MCH: 27.9 pg (ref 26.0–34.0)
MCHC: 34.2 g/dL (ref 30.0–36.0)
MCV: 81.5 fL (ref 80.0–100.0)
Monocytes Absolute: 0.2 10*3/uL (ref 0.1–1.0)
Monocytes Relative: 9 %
Neutro Abs: 1.4 10*3/uL — ABNORMAL LOW (ref 1.7–7.7)
Neutrophils Relative %: 64 %
Platelet Count: 18 10*3/uL — ABNORMAL LOW (ref 150–400)
RBC: 2.76 MIL/uL — ABNORMAL LOW (ref 3.87–5.11)
RDW: 14.1 % (ref 11.5–15.5)
WBC Count: 2.1 10*3/uL — ABNORMAL LOW (ref 4.0–10.5)
nRBC: 0.9 % — ABNORMAL HIGH (ref 0.0–0.2)

## 2019-02-13 LAB — CMP (CANCER CENTER ONLY)
ALT: 43 U/L (ref 0–44)
AST: 76 U/L — ABNORMAL HIGH (ref 15–41)
Albumin: 3 g/dL — ABNORMAL LOW (ref 3.5–5.0)
Alkaline Phosphatase: 567 U/L — ABNORMAL HIGH (ref 38–126)
Anion gap: 15 (ref 5–15)
BUN: 10 mg/dL (ref 6–20)
CO2: 27 mmol/L (ref 22–32)
Calcium: 8.6 mg/dL — ABNORMAL LOW (ref 8.9–10.3)
Chloride: 94 mmol/L — ABNORMAL LOW (ref 98–111)
Creatinine: 0.81 mg/dL (ref 0.44–1.00)
GFR, Est AFR Am: >60 mL/min (ref >60)
GFR, Estimated: >60 mL/min (ref >60)
Glucose, Bld: 206 mg/dL — ABNORMAL HIGH (ref 70–99)
Potassium: 2.9 mmol/L — CL (ref 3.5–5.1)
Sodium: 136 mmol/L (ref 135–145)
Total Bilirubin: 0.7 mg/dL (ref 0.3–1.2)
Total Protein: 6.1 g/dL — ABNORMAL LOW (ref 6.5–8.1)

## 2019-02-13 LAB — SAMPLE TO BLOOD BANK

## 2019-02-13 LAB — PREPARE RBC (CROSSMATCH)

## 2019-02-13 LAB — ABO/RH: ABO/RH(D): O NEG

## 2019-02-13 MED ORDER — POTASSIUM CHLORIDE CRYS ER 20 MEQ PO TBCR: 20.0000 meq | EXTENDED_RELEASE_TABLET | Freq: Every day | ORAL | 1 refills | Status: DC

## 2019-02-13 NOTE — Telephone Encounter (Signed)
Pt notified of results, and new Rx being sent to pharmacy.

## 2019-02-13 NOTE — Telephone Encounter (Signed)
CRITICAL VALUE STICKER  CRITICAL VALUE: Potassium 2.9  MD NOTIFIED: Dr. Nicholas Lose   RESPONSE:  Verbal orders for Potassium Chloride 20 mEq daily.

## 2019-02-13 NOTE — Telephone Encounter (Signed)
Left a voicemail for patient regarding palliative care.

## 2019-02-13 NOTE — Assessment & Plan Note (Signed)
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 Diffuse bone marrow hypermetabolism worrisome for metastatic disease. Prior pericardial effusion resolved.  01/16/2019:Pleural effusion: Thoracentesis revealed metastatic adenocarcinoma breast primary ER 75%, PR 50%, HER-2 -1+ Bone marrow biopsy positive for metastatic breast cancer -------------------------------------------------------------------------------------------------------------------------------------------------------------- Current treatment: Letrozole 2.5 mg daily started 01/15/2019, Ibrance started 01/24/2019 Hospitalization: 02/03/19- 02/07/19 Malignant Pleural effusion S/P thoracentesis Plan:  1. Pleurex catheter placement as outpatient: Patient has appointment to see Dr. Rosana Hoes this Thursday.  If the plan is to do the Pleurx catheter, we can give her platelet transfusion before her procedure. 2. Dec Ibrance to 75 mg: I discussed about starting her today on this even though her blood counts are not adequate with an ANC of 0.7 and a platelet count of 19.  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  Patient understands the risks of taking Ibrance with low blood counts but she is willing to give it a try.  We will have to check her blood counts twice a week.  Lab review:   Right upper quadrant abdominal pain with ultrasound abdomen showing thickened gallbladder.  Alkaline phos was elevated I discussed with Dr.  Donne Hazel who does not think it is gallbladder etiology. Currently on pain regimen.  Patient will return twice a week for labs and follow-ups.

## 2019-02-13 NOTE — Patient Instructions (Addendum)
-   Some fluid reaccumulation but not much - Trying to hold off on interventions if we can - We will see if we can get you portable O2 concentrator - Have Dr. Lindi Adie call us if you need a pleurX or another thoracentesis and we will coordinate - Good luck at [facility]!  Call or reach out on Mychart if any issues.  Dr. Tamala Julian

## 2019-02-13 NOTE — Progress Notes (Signed)
Pulmonary Office Followup Note  Seen in f/u for malignant effusion  S: 53 year old woman with metastatic breast cancer with mets to bone and R pleural space.  Has effusion tapped x 3.  Also RUQ pain with consideration of cholecystitis vs. bony metastases pain.  Admitted 10/26-10/30 for pain control.  Thoracentesis x 2 at that admission (other services performed).  Here for re-evaluation of pleural space and consideration of pleurX if warranted.  No major issues, continued RUQ pain intermittently bothersome. Breathing is stable. On 3L continuous but saturating 100%, wondering if she may qualify for POC.  O: Today's Vitals   02/13/19 1128 02/13/19 1139 02/13/19 1140  BP:   128/80  Pulse:   96  SpO2:  100% 100%  Weight: 114 lb 12.8 oz (52.1 kg)  114 lb 12.8 oz (52.1 kg)  Height: 5' (1.524 m)  5' (1.524 m)   Body mass index is 22.42 kg/m. On 3L  GEN: middle aged woman in NAD HEENT: no thrush, RRR CV: RRR, ext warm PULM: Diminished R base, no accessory muscle use GI: Soft, +BS EXT: No edema NEURO: Moves all 4 ext to command PSYCH: AOx3, excellent insight  SKIN: Pale  CXR office today continued increased interstitial markings, small R effusion reaccumulation but nothing significant  A: # Recurrent malignant R effusion, only small reaccumulation on CXR, she would like to avoid pleurX if able # RUQ pain bony metastases vs. ?GB involvement, oncology reaching out to surgery regarding # Metastatic breast cancer on chemo # Chemo induced pancytopenia # Exertional hypoxemia due to advanced cancer- continues to use and benefit from home oxygen  P: - Do not see need to tap at this time - Patient to go to [place] next week for second opinion regarding chemo options - Continue activity as able, maintain diet - Will walk to see if she qualifies for POC which would improve her mobility greatly - Please reach out if effusion becomes an issue again, she (And I) would prefer to limit  invasive procedures  Erskine Emery MD

## 2019-02-13 NOTE — Telephone Encounter (Signed)
CRITICAL VALUE STICKER  CRITICAL VALUE: K+ = 2.9.  RECEIVER (on-site recipient of call): Mining engineer, Triage [place].   DATE & TIME NOTIFIED: 02/13/2019 at 1309.   MESSENGER (representative from lab): Corbin Ade [facility].  MD NOTIFIED: Dr. Lindi Adie.  TIME OF NOTIFICATION: 02/13/2019 at 1321.  RESPONSE: None. Provider F/U scheduled today.

## 2019-02-14 ENCOUNTER — Ambulatory Visit: Payer: 59 | Admitting: Hematology and Oncology

## 2019-02-14 ENCOUNTER — Other Ambulatory Visit: Payer: 59

## 2019-02-14 ENCOUNTER — Telehealth: Payer: Self-pay | Admitting: Internal Medicine

## 2019-02-14 NOTE — Telephone Encounter (Signed)
Called and spoke with Suanne Marker at [facility], states that pt is already scheduled for an appt with [facility] on 11/11 to see if she can tolerate the POC.  I explained that pt was qualified on a POC in office yesterday per the documentation in Epic, which [facility] has access to.  Rhonda then said that [facility] did not have any POC's to deliver to patient so they were going to evaluate her for a conserving device?   Called Melissa for clarification- states that there is a waitlist for the small [employer] POC's, which is why there is a delay in patient getting the POC as ordered.  The reason she is being tested on 11/11 is to see if she tolerates the machine that they do have in stock to offer her with a conserving device. Per Lenna Sciara, this was discussed with pt about 10:30 this morning.  Spoke with pt, she is aware of the reason for appt and her questions were answered.  Nothing further needed at this time- will close encounter.

## 2019-02-15 ENCOUNTER — Inpatient Hospital Stay: Payer: 59

## 2019-02-15 DIAGNOSIS — D649 Anemia, unspecified: Secondary | ICD-10-CM

## 2019-02-15 DIAGNOSIS — Z5111 Encounter for antineoplastic chemotherapy: Secondary | ICD-10-CM | POA: Diagnosis not present

## 2019-02-15 MED ORDER — SODIUM CHLORIDE 0.9% FLUSH
10.0000 mL | INTRAVENOUS | Status: DC | PRN
  Filled 2019-02-15: qty 10

## 2019-02-15 MED ORDER — SODIUM CHLORIDE 0.9% IV SOLUTION
250.0000 mL | Freq: Once | INTRAVENOUS | Status: AC
  Administered 2019-02-15: 250 mL via INTRAVENOUS
  Filled 2019-02-15: qty 250

## 2019-02-15 MED ORDER — HEPARIN SOD (PORK) LOCK FLUSH 100 UNIT/ML IV SOLN
500.0000 [IU] | Freq: Every day | INTRAVENOUS | Status: DC | PRN
  Filled 2019-02-15: qty 5

## 2019-02-15 MED ORDER — DIPHENHYDRAMINE HCL 25 MG PO CAPS
25.0000 mg | ORAL_CAPSULE | Freq: Once | ORAL | Status: AC
  Administered 2019-02-15: 25 mg via ORAL

## 2019-02-15 MED ORDER — ACETAMINOPHEN 325 MG PO TABS
650.0000 mg | ORAL_TABLET | Freq: Once | ORAL | Status: AC
  Administered 2019-02-15: 650 mg via ORAL

## 2019-02-15 NOTE — Patient Instructions (Signed)
Blood Transfusion, Adult, Care After This sheet gives you information about how to care for yourself after your procedure. Your doctor may also give you more specific instructions. If you have problems or questions, contact your doctor. Follow these instructions at home:   Take over-the-counter and prescription medicines only as told by your doctor.  Go back to your normal activities as told by your doctor.  Follow instructions from your doctor about how to take care of the area where an IV tube was put into your vein (insertion site). Make sure you: ? Wash your hands with soap and water before you change your bandage (dressing). If there is no soap and water, use hand sanitizer. ? Change your bandage as told by your doctor.  Check your IV insertion site every day for signs of infection. Check for: ? More redness, swelling, or pain. ? More fluid or blood. ? Warmth. ? Pus or a bad smell. Contact a doctor if:  You have more redness, swelling, or pain around the IV insertion site.  You have more fluid or blood coming from the IV insertion site.  Your IV insertion site feels warm to the touch.  You have pus or a bad smell coming from the IV insertion site.  Your pee (urine) turns pink, red, or brown.  You feel weak after doing your normal activities. Get help right away if:  You have signs of a serious allergic or body defense (immune) system reaction, including: ? Itchiness. ? Hives. ? Trouble breathing. ? Anxiety. ? Pain in your chest or lower back. ? Fever, flushing, and chills. ? Fast pulse. ? Rash. ? Watery poop (diarrhea). ? Throwing up (vomiting). ? Dark pee. ? Serious headache. ? Dizziness. ? Stiff neck. ? Yellow color in your face or the white parts of your eyes (jaundice). Summary  After a blood transfusion, return to your normal activities as told by your doctor.  Every day, check for signs of infection where the IV tube was put into your vein.  Some  signs of infection are warm skin, more redness and pain, more fluid or blood, and pus or a bad smell where the needle went in.  Contact your doctor if you feel weak or have any unusual symptoms. This information is not intended to replace advice given to you by your health care provider. Make sure you discuss any questions you have with your health care provider. Document Released: 04/17/2014 Document Revised: 08/01/2017 Document Reviewed: 11/19/2015 Elsevier Patient Education  2020 [employer]  

## 2019-02-16 LAB — BPAM RBC
Blood Product Expiration Date: 202012032359
Blood Product Expiration Date: 202012042359
ISSUE DATE / TIME: 202011070814
ISSUE DATE / TIME: 202011070814
Unit Type and Rh: 9500
Unit Type and Rh: 9500

## 2019-02-16 LAB — TYPE AND SCREEN
ABO/RH(D): O NEG
Antibody Screen: NEGATIVE
Unit division: 0
Unit division: 0

## 2019-02-17 ENCOUNTER — Telehealth: Payer: Self-pay

## 2019-02-17 ENCOUNTER — Telehealth: Payer: Self-pay | Admitting: Hematology and Oncology

## 2019-02-17 NOTE — Telephone Encounter (Signed)
Phone call placed to patient to introduce Palliative Care and to offer to schedule a visit with NP. Explained palliative services. Verbal consent obtained for Palliative care. Patient requested visit to be scheduled the following week as she has appointments scheduled each day this week. Visit scheduled for 02/28/2019

## 2019-02-17 NOTE — Telephone Encounter (Signed)
Scheduled appt per 11/9 sch message- scheduled only appt for 11/13 because pt unable to come in today or tomorrow - message sent to RN so she is aware .

## 2019-02-18 ENCOUNTER — Other Ambulatory Visit: Payer: Self-pay | Admitting: Hematology and Oncology

## 2019-02-18 ENCOUNTER — Other Ambulatory Visit: Payer: 59

## 2019-02-18 DIAGNOSIS — C50919 Malignant neoplasm of unspecified site of unspecified female breast: Secondary | ICD-10-CM

## 2019-02-19 ENCOUNTER — Telehealth: Payer: Self-pay | Admitting: Internal Medicine

## 2019-02-19 DIAGNOSIS — R0602 Shortness of breath: Secondary | ICD-10-CM

## 2019-02-19 DIAGNOSIS — J90 Pleural effusion, not elsewhere classified: Secondary | ICD-10-CM

## 2019-02-19 NOTE — Telephone Encounter (Signed)
Called and spoke to patient. Relayed message from [place], NP. Patient verbalized understanding.  Scheduled patient for OV with NP at best time for patient. Advised her to come in earlier than appointment time to get chest xray. Patient reported she would do that. Nothing further needed at this time.

## 2019-02-19 NOTE — Telephone Encounter (Signed)
Patient called back. Spoke with her about symptoms. Patient stated she has had 3 thoracentesis related to her metastatic breast cancer. Patient stated Dr. Tamala Julian told her when symptoms return to give [place] a call. Patient stated symptoms are coming back and reports a dry cough, pain in upper back and a small increase in shortness of breath.  Patient is not wanting another thoracentesis done but is concerned.  Patient stated if provider thinks it is appropriate she will come in for x-ray/exam if needed tomorrow, just not early morning.  Routing to app of the day, Geraldo Pitter, NP.

## 2019-02-19 NOTE — Progress Notes (Signed)
@Patient  ID: Jasmine Allen, female    DOB: 08-Sep-1965, 53 y.o.   MRN: [id number]  Chief Complaint  Patient presents with   Acute Visit    chest xray, increased shortness of breath, pain in back, dry cough    Referring provider: Aletha Halim., PA-C  HPI:  53 year old female followed in our office for recurrent malignant right-sided pleural effusion.  Patient with advanced breast cancer.  Metastatic.  Currently receiving chemotherapy.  PMH: Anemia, depression, GERD, metastatic breast cancer Smoker/ Smoking History: Former smoker Maintenance: None Pt of: Dr. Tamala Julian  02/20/2019  - Visit   53 year old female former smoker last seen in our office on 02/13/2019.  She was evaluated by Dr. Tamala Julian as a follow-up from hospitalization.  Patient continues to have malignant right-sided pleural effusions directly related to metastatic breast cancer.  She has had 3 thoracentesis performed before.  She presented on 02/13/2019 for potential evaluation of a Pleurx catheter.  Patient would like to avoid having a Pleurx catheter if at all possible.  She was last admitted on 02/03/2019 through 02/07/2019 for pain control.  She had 2 thoracentesis done at that admission.  She is maintained on 3 L continuous.  Since last appointment patient has been evaluated at [facility] on 02/18/2019 by Dr. Juanita Craver.  They have made suggestions to augment patient's current treatment regiment for her metastatic breast cancer.  She has an appointment scheduled for tomorrow with Dr. Lindi Adie to discuss these options.  Patient contacted our office earlier this week because she noticed that she had increased onset of pleural pain, dyspnea, orthopnea and cough.  Her cough is dry.  She reports that she has had the symptoms before in the beginning of October when she initially had her first thoracentesis and then also towards the middle of October after she was admitted and had 2 thoracentesis.  She believes that she may need a thoracentesis  again today.  Chest x-ray performed prior to seeing the patient today.  Those results are listed below:  02/20/2019-chest x-ray-stable small bilateral pleural effusions, slight increase right effusion when compared to 02/13/2019   Tests:   01/10/2019-PET scan-pulmonary hypermetabolic zone which is suspicious for lymphangitic tumor spread, more confluent right middle lobe hypermetabolic consolidation, also suspicious for metastatic disease versus less likely infection, hypermetabolic abdominal and cervical and possible left axial nodes suspicious for metastatic disease, diffuse hepatic hypermetabolic activity which is nonspecific, diffuse heterogeneous marrow hypermetabolic them which is especially given the appearance of multifocal sclerosis since 10/30/2015 is most consistent with metastatic disease, small bilateral pleural effusions persist  02/03/2019-CT angio chest-large right and moderate left pleural effusion which have progressed from PET scan on 01/09/2019, fluid is dependent, layering, negative for PE, extensive metastatic disease is recently stage  02/06/2019-CT chest without contrast-right pleural effusion is significantly smaller compared to prior exam, consistent with recent thoracentesis, left pleural effusion is not significantly changed, gross stable reticular densities are noted throughout both lungs with focal consolidation seen in the right middle lobe which is slightly improved compared to prior exam, concerning for lymphangitic spread of malignancy, new focal groundglass opacity noted in the lingular segment of left upper lobe which may represent focal inflammation  01/13/2019 - thoracentesis - Right - 400 cc cloudy fluid   02/03/2019-thoracentesis-right - 600 mL of clear yellow fluid was removed  02/06/2019-thoracentesis-right - 500 cc clear amber fluid removed  02/20/2019-thoracentesis-right-600 cc clear yellow/amber fluid removed  02/03/2019-pleural fluid-No  growth  02/13/2019-chest x-ray-pleural effusions are stable and small volume, more  focal appearing right middle lobe density on the frontal view but stable on lateral view, please ensure no interval pneumonia symptoms  02/20/2019-chest x-ray-stable small bilateral pleural effusions, slight increase right effusion when compared to 02/13/2019  02/20/2019-chest x-ray 1 view-coarse interstitial markings, patchy opacities in the lungs bilaterally right greater than left, similar to recent prior examination, no pleural effusion identified, no pneumothorax  01/07/2019-echocardiogram-LV ejection fraction 60 to 65%, global right ventricle has systolic function  FENO:  No results found for: NITRICOXIDE  PFT: No flowsheet data found.  WALK:  No flowsheet data found.  Imaging: Dg Chest 1 View  Result Date: 02/20/2019 CLINICAL DATA:  53 year old female status post right thoracentesis. History of pleural effusion. EXAM: CHEST  1 VIEW COMPARISON:  Chest x-ray 02/20/2019 at 10:53 a.m. FINDINGS: Lung volumes are slightly low. Widespread interstitial prominence and patchy areas of more coarse reticular opacities and ill-defined airspace opacities, most evident in the right mid lung, similar to the recent prior examination. No definite pleural effusions. No evidence of pulmonary edema. Heart size is normal. Upper mediastinal contours are within normal limits. IMPRESSION: 1. Coarse interstitial markings and patchy opacities in the lungs bilaterally (right greater than left), similar to recent prior examination, corresponding to areas of potential fibrosis or lymphangitic spread of malignancy better demonstrated on prior chest CT. No new acute findings. 2. No pleural effusion identified.  No pneumothorax. Electronically Signed   By: Vinnie Langton M.D.   On: 02/20/2019 14:16   Dg Chest 1 View  Result Date: 02/06/2019 CLINICAL DATA:  Status post right thoracentesis. EXAM: CHEST  1 VIEW COMPARISON:  Yesterday.  FINDINGS: Stable mildly enlarged cardiac silhouette and diffusely prominent interstitial markings. Decreased ill-defined oval density in the right lower lung zone. Mild increase in size of a small left pleural effusion. No visible right pleural fluid. No pneumothorax. Stable mild to moderate thoracolumbar scoliosis. IMPRESSION: 1. No pneumothorax following thoracentesis. 2. Mild increase in size of a small left pleural effusion. 3. Decreased ill-defined oval density in the right lower lung zone. Electronically Signed   By: Claudie Revering M.D.   On: 02/06/2019 10:18   Dg Chest 1 View  Result Date: 02/03/2019 CLINICAL DATA:  Status post thoracentesis. History of breast carcinoma EXAM: CHEST  1 VIEW COMPARISON:  February 03, 2019 chest radiograph and CT angiogram chest February 03, 2019; PET-CT January 09, 2019 FINDINGS: No evident pneumothorax. No appreciable residual pleural effusion on the right. There is a persistent small pleural effusion on the left. There remains reticular opacity throughout the lungs, concerning for lymphangitic spread of tumor based on prior CT appearance. There is ill-defined hazy opacity in the right lower lobe which may represent a degree of re-expansion pulmonary edema. There is also atelectatic change in the lung bases. Heart is upper normal in size with pulmonary vascularity normal. No appreciable adenopathy evident by radiography. No bone lesions. IMPRESSION: No pneumothorax. Persistent small left pleural effusion. Diffuse reticular opacity concerning for lymphangitic spread of tumor. Stable atelectasis left base. Ill-defined hazy opacity in the right mid lower lung zones may represent a degree of re-expansion pulmonary edema given the recent thoracentesis. Stable cardiac silhouette. Electronically Signed   By: Lowella Grip III M.D.   On: 02/03/2019 10:49   Chest Xray  Result Date: 02/20/2019 CLINICAL DATA:  Dyspnea, breast cancer, recent right thoracentesis EXAM: CHEST - 2  VIEW COMPARISON:  02/13/2019 chest radiograph. FINDINGS: Stable cardiomediastinal silhouette with normal heart size. No pneumothorax. Small bilateral pleural effusions, stable. Patchy and  reticular opacities throughout both lungs, most prominent in the anterior right mid lung, unchanged. IMPRESSION: 1. Stable small bilateral pleural effusions. 2. 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 Electronically Signed   By: Ilona Sorrel M.D.   On: 02/20/2019 11:11   Dg Chest 2 View  Result Date: 02/13/2019 CLINICAL DATA:  Follow-up pleural effusion EXAM: CHEST - 2 VIEW COMPARISON:  02/06/2019 FINDINGS: Small bilateral pleural effusion are unchanged. Stable reticulonodular opacities on both sides with focal airspace opacity below the right minor fissure. Normal heart size. Scoliosis. IMPRESSION: 1. Pleural effusions are stable and small volume. 2. More focal appearing right middle lobe density on the frontal view but stable on the lateral view. Please ensure no interval pneumonia symptoms. Electronically Signed   By: Monte Fantasia M.D.   On: 02/13/2019 11:42   Dg Chest 2 View  Result Date: 02/05/2019 CLINICAL DATA:  History of metastatic breast cancer.  Severe pain. EXAM: CHEST - 2 VIEW COMPARISON:  02/03/2019.  Single-view of the chest and CT chest FINDINGS: Right pleural effusion appears mildly increased compared to the prior plain films. Much smaller left pleural effusion is unchanged. No pneumothorax. Extensive reticulonodular opacities throughout both lungs and chronic opacity in the right middle lobe again seen. Heart size is normal. Osseous metastatic disease noted. IMPRESSION: Mild increase in right pleural effusion since the most recent plain film. Small left pleural effusion is unchanged. Stable appearance of chronic right middle lobe opacity and diffuse reticulonodular change. Electronically Signed   By: Inge Rise M.D.   On: 02/05/2019 12:48   Dg Chest 2 View  Result Date: 01/24/2019 CLINICAL DATA:  Shortness of breath. Pleural effusion. EXAM: CHEST - 2 VIEW COMPARISON:  Chest x-rays dated 01/13/2019 and PET-CT dated 01/09/2019 FINDINGS: There is progression of the infiltrate in the right middle lobe. There is persistent diffuse accentuation of the interstitial markings bilaterally. Heart size and vascularity are normal. Minimal right effusion, unchanged. Small left effusion, slightly more prominent. No acute bone abnormality. Thoracolumbar scoliosis. IMPRESSION: 1. Progressive infiltrate in the right middle lobe. 2. Slight increase in small left pleural effusion. 3. No change in the small right pleural effusion. 4. Persistent diffuse accentuation of the interstitial markings. Electronically Signed   By: Lorriane Shire M.D.   On: 01/24/2019 11:33   Ct Chest Wo Contrast  Result Date: 02/06/2019 CLINICAL DATA:  Chest pain. History of metastatic breast cancer. Status post thoracentesis. EXAM: CT CHEST WITHOUT CONTRAST TECHNIQUE: Multidetector CT imaging of the chest was performed following the standard protocol without IV contrast. COMPARISON:  February 03, 2019. FINDINGS: Cardiovascular: No significant vascular findings. Normal heart size. No pericardial effusion. Mediastinum/Nodes: No enlarged mediastinal or axillary lymph nodes. Thyroid gland, trachea, and esophagus demonstrate no significant findings. Lungs/Pleura: Right pleural effusion noted on prior exam is significantly smaller currently status post thoracentesis. Left pleural effusion is not significantly changed. No pneumothorax is noted. Grossly stable reticular opacities are noted throughout both lungs with focal consolidation seen in the right middle lobe which is slightly decreased compared to prior exam. This may represent lymphangitic spread of malignancy. New focal ground-glass opacity is noted in lingular segment of left upper lobe which may  represent inflammation. Upper Abdomen: No acute abnormality. Musculoskeletal: Diffuse sclerotic densities are noted consistent with osseous metastases. IMPRESSION: Right pleural effusion is significantly smaller compared to prior exam, consistent with recent thoracentesis. Left pleural effusion is not significantly changed. Grossly stable reticular  densities are noted throughout both lungs with focal consolidation seen in right middle lobe which is slightly improved compared to prior exam, concerning for lymphangitic spread of malignancy. New focal ground-glass opacity is noted in lingular segment of left upper lobe which may represent focal inflammation. Stable diffuse osseous metastases. Electronically Signed   By: Marijo Conception M.D.   On: 02/06/2019 15:42   Ct Angio Chest Pe W/cm &/or Wo Cm  Result Date: 02/03/2019 CLINICAL DATA:  Breast cancer.  Recent thoracentesis. EXAM: CT ANGIOGRAPHY CHEST WITH CONTRAST TECHNIQUE: Multidetector CT imaging of the chest was performed using the standard protocol during bolus administration of intravenous contrast. Multiplanar CT image reconstructions and MIPs were obtained to evaluate the vascular anatomy. CONTRAST:  164mL OMNIPAQUE IOHEXOL 350 MG/ML SOLN COMPARISON:  PET CT from 01/09/2019 FINDINGS: Cardiovascular: Normal heart size. No pericardial effusion. Normal aorta. No pulmonary artery filling defects. Mediastinum/Nodes: Negative for adenopathy in the chest Lungs/Pleura: Reticulonodular opacities in the upper lungs with chronic consolidation in the right middle lobe, likely lymphangitic tumor based on previous staging. Large right and moderate left pleural effusion which is layering without visible parietal pleural nodularity. No acute airspace disease. Upper Abdomen: Negative in the arterial phase Musculoskeletal: Osseous metastatic disease which is widespread. No acute osseous finding. Review of the MIP images confirms the above findings. IMPRESSION: 1. Large  right and moderate left pleural effusion which have progressed from PET 01/09/2019. The fluid is dependent/layering. 2. Negative for pulmonary embolism. 3. Extensive metastatic disease as recently staged. Electronically Signed   By: Monte Fantasia M.D.   On: 02/03/2019 06:01   Dg Chest Port 1 View  Result Date: 02/06/2019 CLINICAL DATA:  Chest pain after thoracentesis. EXAM: PORTABLE CHEST 1 VIEW COMPARISON:  Same day. FINDINGS: Stable mild cardiomegaly. No pneumothorax is noted. Stable diffuse interstitial densities are noted throughout both lungs. Small pleural effusions are noted. Stable thoracic scoliosis. IMPRESSION: Stable mild diffuse interstitial densities throughout both lungs. Small bilateral pleural effusions are noted. No pneumothorax is noted. Electronically Signed   By: Marijo Conception M.D.   On: 02/06/2019 13:04   Dg Chest Port 1 View  Result Date: 02/03/2019 CLINICAL DATA:  Right chest pain.  Thoracentesis EXAM: PORTABLE CHEST 1 VIEW COMPARISON:  Ten days ago FINDINGS: Normal size for technique. Reticulonodular density on both sides with small pleural effusions, mildly increased on the right. No pneumothorax. Bony metastatic disease by CT. IMPRESSION: 1. Small bilateral pleural effusion with probable mild increase on the right since study 10 days ago. There may also be right lower lobe atelectasis since prior. 2. 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 Electronically Signed   By: Monte Fantasia M.D.   On: 02/03/2019 04:53   US Abdomen Limited Ruq  Result Date: 02/06/2019 CLINICAL DATA:  Right upper quadrant pain EXAM: ULTRASOUND ABDOMEN LIMITED RIGHT UPPER QUADRANT COMPARISON:  None. FINDINGS: Gallbladder: Collapsed gallbladder. No gallstones. Negative sonographic Murphy sign. Gallbladder wall 3.8 mm which is thickened but likely due to collapse gallbladder Common bile duct: Diameter: 3.4 mm. Liver: No focal lesion  identified. Within normal limits in parenchymal echogenicity. Portal vein is patent on color Doppler imaging with normal direction of blood flow towards the liver. Other: Small right effusion IMPRESSION: Gallbladder is collapsed. No gallstones are identified. Gallbladder wall appears thickened however this is likely due to incomplete distention. No biliary dilatation or liver abnormality Small right effusion. Electronically Signed   By: Franchot Gallo M.D.   On:  02/06/2019 18:06   US Thoracentesis Asp Pleural Space W/img Guide  Result Date: 02/06/2019 INDICATION: History of breast cancer. Recurrent right pleural effusion. Request for therapeutic thoracentesis. EXAM: ULTRASOUND GUIDED RIGHT THORACENTESIS MEDICATIONS: None. COMPLICATIONS: None immediate. Postprocedural chest x-ray negative for pneumothorax. PROCEDURE: An ultrasound guided thoracentesis was thoroughly discussed with the patient and questions answered. The benefits, risks, alternatives and complications were also discussed. The patient understands and wishes to proceed with the procedure. Written consent was obtained. Ultrasound was performed to localize and mark an adequate pocket of fluid in the right chest. The area was then prepped and draped in the normal sterile fashion. 1% Lidocaine was used for local anesthesia. Under ultrasound guidance a 6 Fr Safe-T-Centesis catheter was introduced. Thoracentesis was performed. The catheter was removed and a dressing applied. FINDINGS: A total of approximately 500 mL of clear, amber colored fluid was removed. IMPRESSION: Successful ultrasound guided right thoracentesis yielding 500 mL of pleural fluid. Read by: Ascencion Dike PA-C Electronically Signed   By: Markus Daft M.D.   On: 02/06/2019 10:44   US Thoracentesis Asp Pleural Space W/img Guide  Result Date: 02/03/2019 INDICATION: Pleural effusion EXAM: ULTRASOUND GUIDED RIGHT THORACENTESIS MEDICATIONS: None. COMPLICATIONS: None immediate. PROCEDURE:  An ultrasound guided thoracentesis was thoroughly discussed with the patient and questions answered. The benefits, risks, alternatives and complications were also discussed. The patient understands and wishes to proceed with the procedure. Written consent was obtained. Ultrasound was performed to localize and mark an adequate pocket of fluid in the right posterior chest. The area was then prepped and draped in the normal sterile fashion. 1% Lidocaine was used for local anesthesia. Under ultrasound guidance a 19 gauge, 7-cm, Yueh catheter was introduced. Thoracentesis was performed. The catheter was removed and a dressing applied. FINDINGS: A total of approximately 600 mL of clear yellow fluid was removed. Samples were sent to the laboratory as requested by the clinical team. IMPRESSION: Successful ultrasound guided right thoracentesis yielding 600 mL of pleural fluid. Electronically Signed   By: Rolm Baptise M.D.   On: 02/03/2019 10:45    Lab Results:  CBC    Component Value Date/Time   WBC 2.1 (L) 02/13/2019 1222   WBC 0.9 (LL) 02/06/2019 0524   RBC 2.76 (L) 02/13/2019 1222   HGB 7.7 (L) 02/13/2019 1222   HCT 22.5 (L) 02/13/2019 1222   PLT 18 (L) 02/13/2019 1222   MCV 81.5 02/13/2019 1222   MCH 27.9 02/13/2019 1222   MCHC 34.2 02/13/2019 1222   RDW 14.1 02/13/2019 1222   LYMPHSABS 0.5 (L) 02/13/2019 1222   MONOABS 0.2 02/13/2019 1222   EOSABS 0.0 02/13/2019 1222   BASOSABS 0.0 02/13/2019 1222    BMET    Component Value Date/Time   NA 136 02/13/2019 1222   K 2.9 (LL) 02/13/2019 1222   CL 94 (L) 02/13/2019 1222   CO2 27 02/13/2019 1222   GLUCOSE 206 (H) 02/13/2019 1222   BUN 10 02/13/2019 1222   CREATININE 0.81 02/13/2019 1222   CALCIUM 8.6 (L) 02/13/2019 1222   GFRNONAA >60 02/13/2019 1222   GFRAA >60 02/13/2019 1222    BNP No results found for: BNP  ProBNP No results found for: PROBNP  Specialty Problems      Pulmonary Problems   Community acquired pneumonia    Dyspnea   Pleural effusion      Allergies  Allergen Reactions   Amoxicillin-Pot Clavulanate Other (See Comments)    Big doses cause diarrhea    Erythromycin Rash  Metoclopramide Hcl Other (See Comments)    Restless legs   Sulfonamide Derivatives Rash    Immunization History  Administered Date(s) Administered   Hep A / Hep B 06/17/2018   Influenza-Unspecified 01/10/2010, 02/09/2011, 01/30/2012, 02/03/2013   Tdap 07/22/2011    Past Medical History:  Diagnosis Date   Alopecia, unspecified    Anemia, unspecified    during her pregnancy   Anxiety state, unspecified    Breast cancer ([place]) 2019   Right Breast Cancer   Bulging disc    Depressive disorder, not elsewhere classified    Diverticulosis of colon (without mention of hemorrhage)    CT Scan   Dysrhythmia    PVCs in the past   Esophageal reflux    Esophagitis 1998   Hiatal hernia 1998   History of kidney stones    Hypercholesteremia    Hypertension    Migraine    Opioid abuse, in remission ([place])     Tobacco History: Social History   Tobacco Use  Smoking Status Former Smoker   Packs/day: 0.25   Quit date: 08/25/2016   Years since quitting: 2.4  Smokeless Tobacco Never Used   Counseling given: Yes  Continue to not smoke  Outpatient Encounter Medications as of 02/20/2019  Medication Sig   ALPRAZolam (XANAX) 0.25 MG tablet Take 1 tablet (0.25 mg total) by mouth at bedtime as needed for anxiety.   atorvastatin (LIPITOR) 20 MG tablet Take 20 mg by mouth at bedtime.    Biotin 10000 MCG TABS Take 10,000 mcg by mouth daily.    bumetanide (BUMEX) 1 MG tablet TAKE 1 TABLET(1 MG) BY MOUTH TWICE DAILY (Patient taking differently: Take 1 mg by mouth 2 (two) times daily. )   cetirizine (ZYRTEC) 10 MG tablet Take 10 mg by mouth daily.   chlorpheniramine-HYDROcodone (TUSSIONEX PENNKINETIC ER) 10-8 MG/5ML SUER Take 5 mLs by mouth 2 (two) times daily.   cyclobenzaprine (FLEXERIL) 10 MG  tablet Take 10 mg by mouth every 8 (eight) hours as needed for muscle spasms.   hydrOXYzine (ATARAX/VISTARIL) 10 MG tablet Take 10-30 mg by mouth at bedtime as needed for itching.    hyoscyamine (LEVSIN SL) 0.125 MG SL tablet dissolve 1 tablet under the tongue every 4 hours if needed (Patient taking differently: Take 0.125 mg by mouth every 4 (four) hours as needed for cramping. )   letrozole (FEMARA) 2.5 MG tablet Take 1 tablet (2.5 mg total) by mouth daily.   LORazepam (ATIVAN) 0.5 MG tablet Take 1 tablet (0.5 mg total) by mouth every 8 (eight) hours.   morphine (MS CONTIN) 15 MG 12 hr tablet Take 1 tablet (15 mg total) by mouth every 12 (twelve) hours.   morphine (MSIR) 15 MG tablet Take 1 tablet (15 mg total) by mouth every 6 (six) hours as needed for severe pain.   Multiple Vitamin (MULITIVITAMIN WITH MINERALS) TABS Take 1 tablet by mouth daily.   palbociclib (IBRANCE) 75 MG tablet Take 75 mg by mouth daily. Take for 21 days on, 7 days off, repeat every 28 days.   pantoprazole (PROTONIX) 40 MG tablet Take 1 tablet (40 mg total) by mouth 2 (two) times daily before a meal. (Patient taking differently: Take 40 mg by mouth 2 (two) times daily. )   polyethylene glycol (MIRALAX / GLYCOLAX) 17 g packet Take 17 g by mouth 2 (two) times daily. Decrease to daily if having watery or multiple loose stools daily.   potassium chloride SA (KLOR-CON) 20 MEQ tablet Take 1  tablet (20 mEq total) by mouth daily.   traZODone (DESYREL) 100 MG tablet Take 150 mg by mouth at bedtime.    albuterol (PROVENTIL) (2.5 MG/3ML) 0.083% nebulizer solution Inhale 3 mLs into the lungs every 6 (six) hours as needed for wheezing or shortness of breath (cough).   No facility-administered encounter medications on file as of 02/20/2019.      Review of Systems  Review of Systems  Constitutional: Positive for fatigue. Negative for activity change and fever.  HENT: Negative for sinus pressure, sinus pain and sore  throat.   Respiratory: Positive for cough and shortness of breath. Negative for wheezing.   Cardiovascular: Positive for chest pain (Likely pleural pain, back pain). Negative for palpitations.  Gastrointestinal: Negative for diarrhea, nausea and vomiting.  Musculoskeletal: Negative for arthralgias.  Neurological: Negative for dizziness.  Psychiatric/Behavioral: Negative for sleep disturbance. The patient is not nervous/anxious.      Physical Exam  BP 110/74 (BP Location: Left Arm, Cuff Size: Normal)    Pulse 98    Temp 97.6 F (36.4 C) (Temporal)    Wt 116 lb (52.6 kg)    LMP 12/24/2010    SpO2 96%    BMI 22.65 kg/m   Wt Readings from Last 5 Encounters:  02/20/19 116 lb (52.6 kg)  02/13/19 114 lb 6.4 oz (51.9 kg)  02/13/19 114 lb 12.8 oz (52.1 kg)  02/10/19 116 lb (52.6 kg)  02/03/19 115 lb 11.9 oz (52.5 kg)    BMI Readings from Last 5 Encounters:  02/20/19 22.65 kg/m  02/13/19 22.34 kg/m  02/13/19 22.42 kg/m  02/10/19 22.65 kg/m  02/03/19 22.60 kg/m     Physical Exam Vitals signs and nursing note reviewed.  Constitutional:      General: She is not in acute distress.    Appearance: Normal appearance. She is normal weight.  HENT:     Head: Normocephalic and atraumatic.     Right Ear: Tympanic membrane, ear canal and external ear normal. There is no impacted cerumen.     Left Ear: Tympanic membrane, ear canal and external ear normal. There is no impacted cerumen.     Nose: Nose normal. No congestion or rhinorrhea.     Mouth/Throat:     Mouth: Mucous membranes are moist.     Pharynx: Oropharynx is clear.  Eyes:     Pupils: Pupils are equal, round, and reactive to light.  Neck:     Musculoskeletal: Normal range of motion.  Cardiovascular:     Rate and Rhythm: Normal rate and regular rhythm.     Pulses: Normal pulses.     Heart sounds: Normal heart sounds. No murmur.  Pulmonary:     Effort: Pulmonary effort is normal. No respiratory distress.     Breath sounds:  Decreased air movement present. Examination of the right-middle field reveals decreased breath sounds. Examination of the right-lower field reveals decreased breath sounds. Examination of the left-lower field reveals decreased breath sounds. Decreased breath sounds present. No wheezing or rales.  Musculoskeletal:     Right lower leg: No edema.     Left lower leg: No edema.  Skin:    General: Skin is warm and dry.     Capillary Refill: Capillary refill takes less than 2 seconds.  Neurological:     General: No focal deficit present.     Mental Status: She is alert and oriented to person, place, and time. Mental status is at baseline.     Gait: Gait normal.  Psychiatric:        Mood and Affect: Mood normal.        Behavior: Behavior normal.        Thought Content: Thought content normal.        Judgment: Judgment normal.       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 Tamala Julian who is working in the hospital.  Dr. Tamala Julian was able to come to our office in order to perform a bedside thoracentesis in office today as it was going to be very difficult to get the patient scheduled with IR or in the bronc suite which likely would not happen until next week and likely would result in the patient needing to be hospitalized and going into the emergency room.  Bedside right-sided thoracentesis was completed today.  600 cc of clear pleural fluid was removed.  None were sent to testing.  Patient reported immediate relief and breathing.  No acute chest pain.  Follow-up  chest x-ray shows no pneumothorax.  Plan: Right-sided ultrasound-guided thoracentesis performed at bedside in clinic today Patient to follow-up if symptoms worsen Complete follow-up with oncology as discussed today      Return in about 2 weeks (around 03/06/2019), or if symptoms worsen or fail to improve, for Follow up with Wyn Quaker FNP-C, Follow up with Dr. Tamala Julian.   Lauraine Rinne, NP 02/20/2019   This appointment was 75 minutes long with over 50% of the time in direct face-to-face patient care, assessment, plan of care, and follow-up.

## 2019-02-19 NOTE — Telephone Encounter (Signed)
Attempted to call pt but unable to reach. Left message for pt to return call. 

## 2019-02-19 NOTE — Telephone Encounter (Signed)
Needs CXR tomorrow (ordered) and office visit after. If symptoms worsen over night needs to go to ED for eval

## 2019-02-20 ENCOUNTER — Encounter: Payer: Self-pay | Admitting: Pulmonary Disease

## 2019-02-20 ENCOUNTER — Ambulatory Visit (INDEPENDENT_AMBULATORY_CARE_PROVIDER_SITE_OTHER): Payer: 59 | Admitting: Pulmonary Disease

## 2019-02-20 ENCOUNTER — Ambulatory Visit (INDEPENDENT_AMBULATORY_CARE_PROVIDER_SITE_OTHER): Payer: 59

## 2019-02-20 ENCOUNTER — Other Ambulatory Visit: Payer: Self-pay

## 2019-02-20 VITALS — BP 110/74 | HR 98 | Temp 97.6°F | Wt 116.0 lb

## 2019-02-20 DIAGNOSIS — R0602 Shortness of breath: Secondary | ICD-10-CM | POA: Diagnosis not present

## 2019-02-20 DIAGNOSIS — J90 Pleural effusion, not elsewhere classified: Secondary | ICD-10-CM

## 2019-02-20 NOTE — Assessment & Plan Note (Signed)
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 Tamala Julian who is working in the hospital.  Dr. Tamala Julian was able to come to our office in order to perform a bedside thoracentesis in office today as it was going to be very difficult to get the patient scheduled with IR or in the bronc suite which likely would not happen until next week and likely would result in the patient needing to be hospitalized and going into the emergency room.  Bedside right-sided thoracentesis was completed today.  600 cc of clear pleural fluid was removed.  None were sent to testing.  Patient reported immediate relief and breathing.  No acute chest pain.  Follow-up chest x-ray shows no pneumothorax.  Plan: Right-sided ultrasound-guided thoracentesis performed at bedside in clinic today Patient to follow-up if symptoms worsen Complete follow-up with oncology as discussed today

## 2019-02-20 NOTE — Progress Notes (Signed)
Discussed results with patient in office.  Nothing further is needed at this time.  Brian Mack FNP  

## 2019-02-20 NOTE — Progress Notes (Signed)
Discussed results with patient in office.  Nothing further is needed at this time.  Neveah Bang FNP  

## 2019-02-20 NOTE — Procedures (Signed)
Thora w/ [place] Note Timeout performed Right chest examined with [place] and skin overlying fluid pocket marked Area prepped and anesthesized with 1% lidocaine 600  cc turbid  fluid removed Bandaid applied to site CXR pending No immediate complications

## 2019-02-20 NOTE — Progress Notes (Signed)
You are seeing her today

## 2019-02-20 NOTE — Progress Notes (Signed)
Patient Care Team: Aletha Halim., PA-C as PCP - General (Family Medicine) Nicholas Lose, MD as Consulting Physician (Hematology and Oncology) Kyung Rudd, MD as Consulting Physician (Radiation Oncology) Rolm Bookbinder, MD as Consulting Physician (General Surgery)  DIAGNOSIS:    ICD-10-CM   1. Malignant neoplasm of lower-inner quadrant of right breast of female, estrogen receptor positive ([place])  C50.311    Z17.0     SUMMARY OF ONCOLOGIC HISTORY: Oncology History  Malignant neoplasm of lower-inner quadrant of right breast of female, estrogen receptor positive ([place])  08/16/2017 Initial Diagnosis   Right lumpectomy: 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  High risk   08/16/2017 Cancer Staging   Staging form: Breast, AJCC 8th Edition - Pathologic stage from 08/16/2017: Stage IA (pT1c, pN0, cM0, G2, ER+, PR+, HER2-, Oncotype DX score: 31) - Signed by Gardenia Phlegm, NP on 09/12/2018   09/25/2017 - 02/05/2018 Chemotherapy   Adjuvant chemotherapy with dose dense Adriamycin and Cytoxan x4 followed by Taxol weekly x12    03/11/2018 - 04/25/2018 Radiation Therapy   Adjuvant XRT   01/09/2019 PET scan   Pulmonary hypermetabolic and consistent with lymphangitic tumor spread, confluent right middle lobe consolidation, hypermetabolic abdominal, cervical and left axillary lymph nodes, diffuse liver hypermetabolic some nonspecific.  Diffuse bone marrow hypermetabolism worrisome for metastatic disease.  Prior pericardial effusion resolved.   01/16/2019 Relapse/Recurrence   Pleural effusion: Thoracentesis revealed metastatic adenocarcinoma breast primary ER 75%, PR 50%, HER-2 -1+ Bone marrow biopsy positive for metastatic breast cancer     CHIEF  COMPLIANT: Follow-up of metastatic breast cancer  INTERVAL HISTORY: Jasmine Allen is a 53 y.o. 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 She is currently on treatment with letrozole and Ibrance. She presents to the clinic today for follow-up and a toxicity check.  She had a second opinion consultation at [facility] with Dr. Erasmo Score.  I spoke with Dr. Erasmo Score and we made a decision that we should add Faslodex to her treatment.  REVIEW OF SYSTEMS:   Constitutional: Denies fevers, chills or abnormal weight loss Eyes: Denies blurriness of vision Ears, nose, mouth, throat, and face: Denies mucositis or sore throat Respiratory: Denies cough, dyspnea or wheezes Cardiovascular: Denies palpitation, chest discomfort Gastrointestinal: Denies nausea, heartburn or change in bowel habits Skin: Denies abnormal skin rashes Lymphatics: Denies new lymphadenopathy or easy bruising Neurological: Denies numbness, tingling or new weaknesses Behavioral/Psych: Mood is stable, no new changes  Extremities: No lower extremity edema Breast: denies any pain or lumps or nodules in either breasts All other systems were reviewed with the patient and are negative.  I have reviewed the past medical history, past surgical history, social history and family history with the patient and they are unchanged from previous note.  ALLERGIES:  is allergic to amoxicillin-pot clavulanate; erythromycin; metoclopramide hcl; and sulfonamide derivatives.  MEDICATIONS:  Current Outpatient Medications  Medication Sig Dispense Refill  . albuterol (PROVENTIL) (2.5 MG/3ML) 0.083% nebulizer solution Inhale 3 mLs into the lungs every 6 (six) hours as needed for wheezing or shortness of breath (cough). 360 mL 0  . ALPRAZolam (XANAX) 0.25 MG tablet Take 1 tablet (0.25 mg total) by mouth at bedtime as needed for  anxiety. 30 tablet 0  . atorvastatin (LIPITOR) 20 MG tablet Take 20 mg  by mouth at bedtime.     . Biotin 10000 MCG TABS Take 10,000 mcg by mouth daily.     . bumetanide (BUMEX) 1 MG tablet TAKE 1 TABLET(1 MG) BY MOUTH TWICE DAILY (Patient taking differently: Take 1 mg by mouth 2 (two) times daily. ) 60 tablet 2  . cetirizine (ZYRTEC) 10 MG tablet Take 10 mg by mouth daily.    . chlorpheniramine-HYDROcodone (TUSSIONEX PENNKINETIC ER) 10-8 MG/5ML SUER Take 5 mLs by mouth 2 (two) times daily.    . cyclobenzaprine (FLEXERIL) 10 MG tablet Take 10 mg by mouth every 8 (eight) hours as needed for muscle spasms.  1  . hydrOXYzine (ATARAX/VISTARIL) 10 MG tablet Take 10-30 mg by mouth at bedtime as needed for itching.   0  . hyoscyamine (LEVSIN SL) 0.125 MG SL tablet dissolve 1 tablet under the tongue every 4 hours if needed (Patient taking differently: Take 0.125 mg by mouth every 4 (four) hours as needed for cramping. ) 30 tablet 0  . letrozole (FEMARA) 2.5 MG tablet Take 1 tablet (2.5 mg total) by mouth daily. 90 tablet 3  . LORazepam (ATIVAN) 0.5 MG tablet Take 1 tablet (0.5 mg total) by mouth every 8 (eight) hours. 30 tablet 0  . morphine (MS CONTIN) 15 MG 12 hr tablet Take 1 tablet (15 mg total) by mouth every 12 (twelve) hours. 60 tablet 0  . morphine (MSIR) 15 MG tablet Take 1 tablet (15 mg total) by mouth every 6 (six) hours as needed for severe pain. 60 tablet 0  . Multiple Vitamin (MULITIVITAMIN WITH MINERALS) TABS Take 1 tablet by mouth daily.    . palbociclib (IBRANCE) 75 MG tablet Take 75 mg by mouth daily. Take for 21 days on, 7 days off, repeat every 28 days.    . pantoprazole (PROTONIX) 40 MG tablet Take 1 tablet (40 mg total) by mouth 2 (two) times daily before a meal. (Patient taking differently: Take 40 mg by mouth 2 (two) times daily. ) 60 tablet 6  . polyethylene glycol (MIRALAX / GLYCOLAX) 17 g packet Take 17 g by mouth 2 (two) times daily. Decrease to daily if having watery or multiple loose stools daily. 30 each 0  . potassium chloride SA (KLOR-CON) 20 MEQ  tablet Take 1 tablet (20 mEq total) by mouth daily. 30 tablet 1  . traZODone (DESYREL) 100 MG tablet Take 150 mg by mouth at bedtime.      No current facility-administered medications for this visit.     PHYSICAL EXAMINATION: ECOG PERFORMANCE STATUS: 1 - Symptomatic but completely ambulatory  Vitals:   02/21/19 1149  BP: 117/79  Pulse: 85  Resp: 20  Temp: 98.5 F (36.9 C)  SpO2: 99%   Filed Weights   02/21/19 1149  Weight: 115 lb (52.2 kg)    GENERAL: alert, no distress and comfortable SKIN: skin color, texture, turgor are normal, no rashes or significant lesions EYES: normal, Conjunctiva are pink and non-injected, sclera clear OROPHARYNX: no exudate, no erythema and lips, buccal mucosa, and tongue normal  NECK: supple, thyroid normal size, non-tender, without nodularity LYMPH: no palpable lymphadenopathy in the cervical, axillary or inguinal LUNGS: clear to auscultation and percussion with normal breathing effort HEART: regular rate & rhythm and no murmurs and no lower extremity edema ABDOMEN: abdomen soft, non-tender and normal bowel sounds MUSCULOSKELETAL: no cyanosis of digits and no clubbing  NEURO: alert & oriented  x 3 with fluent speech, no focal motor/sensory deficits EXTREMITIES: No lower extremity edema  LABORATORY DATA:  I have reviewed the data as listed CMP Latest Ref Rng & Units 02/13/2019 02/10/2019 02/05/2019  Glucose 70 - 99 mg/dL 206(H) 207(H) 117(H)  BUN 6 - 20 mg/dL 10 12 15   Creatinine 0.44 - 1.00 mg/dL 0.81 0.83 0.57  Sodium 135 - 145 mmol/L 136 135 137  Potassium 3.5 - 5.1 mmol/L 2.9(LL) 3.2(L) 4.3  Chloride 98 - 111 mmol/L 94(L) 92(L) 99  CO2 22 - 32 mmol/L 27 31 31   Calcium 8.9 - 10.3 mg/dL 8.6(L) 8.8(L) 8.7(L)  Total Protein 6.5 - 8.1 g/dL 6.1(L) 6.5 5.6(L)  Total Bilirubin 0.3 - 1.2 mg/dL 0.7 1.1 0.7  Alkaline Phos 38 - 126 U/L 567(H) 480(H) 326(H)  AST 15 - 41 U/L 76(H) 83(H) 56(H)  ALT 0 - 44 U/L 43 48(H) 37    Lab Results  Component  Value Date   WBC 1.1 (L) 02/21/2019   HGB 11.1 (L) 02/21/2019   HCT 31.8 (L) 02/21/2019   MCV 84.1 02/21/2019   PLT 14 (L) 02/21/2019   NEUTROABS PENDING 02/21/2019    ASSESSMENT & PLAN:  Malignant neoplasm of lower-inner quadrant of right breast of female, estrogen receptor positive ([facility]) 08/16/2017:Right lumpectomy: Grade 2 IDC 1.5 cm, with DCIS and necrosis, 0/3 lymph nodes negative, ER 95%, PR 30%, HER-2 negative ratio 1.14, Ki-67 40%, T1CN0 stage Ia; resection of the margin 09/11/2017: Benign  01/09/2019: PET CT scan:Pulmonary hypermetabolic and consistent with lymphangitic tumor spread, confluent right middle lobe consolidation, hypermetabolic abdominal, cervical and left axillary lymph nodes, diffuse liver hypermetabolic some nonspecific. Diffuse bone marrow hypermetabolism worrisome for metastatic disease. Prior pericardial effusion resolved.  01/16/2019:Pleural effusion: Thoracentesis revealed metastatic adenocarcinoma breast primary ER 75%, PR 50%, HER-2 -1+ Bone marrow biopsy positive for metastatic breast cancer  [facility] molecular testing: ER/PR positive HER-2 negative, ER positive, TMB low (1), BRCA 1 and 2 -, ESR 1 mutation not detected, PI K3 CA negative PD-L1 by guardant will be obtained -------------------------------------------------------------------------------------------------------------------------------------------------------------- Current treatment: Letrozole 2.5 mg daily started 01/15/2019, Ibrance started 01/24/2019, Faslodex and Xgeva added 02/21/2019 Hospitalization: 02/03/19- 02/07/19 Malignant Pleural effusion S/P thoracentesis Plan:  1.  Faslodex will be added to the treatment 2.  Continue Ibrance 75 mg 3.  Xgeva q 3 months  We had a lengthy conversation about Ibrance versus Verzenio. If her blood counts next week remain low then we will switch her to [employer]. Continued problems with severe thrombocytopenia and the severe leukopenia: ANC today is 0.3 and  platelets are only 14. This is because of bone marrow infiltration by malignancy. We decided to continue with treatment in spite of low counts.  She understands risks of this approach.  Severe anemia: Blood transfusion given. Right upper quadrant discomfort: Discussed with Dr. Donne Hazel who does not think she needs surgery for this.    No orders of the defined types were placed in this encounter.  The patient has a good understanding of the overall plan. she agrees with it. she will call with any problems that may develop before the next visit here.  Nicholas Lose, MD 02/21/2019  Julious Oka Dorshimer, am acting as scribe for Dr. Nicholas Lose.  I have reviewed the above documentation for accuracy and completeness, and I agree with the above.

## 2019-02-20 NOTE — Patient Instructions (Addendum)
You were seen today by Lauraine Rinne, NP  for:   1. Pleural effusion  - Thoracentesis; Future  Dr. Tamala Julian completed outpatient thoracentesis today remove 600 cc of fluid.  We recommend today:  Orders Placed This Encounter  Procedures  . Thoracentesis    Dr Tamala Julian needs appt in [place], he is on 2H all weekend.    Standing Status:   Future    Standing Expiration Date:   02/20/2020    Scheduling Instructions:     Schedule asap with covid testing    Order Specific Question:   Where should this test be performed?    Answer:   [facility]   Orders Placed This Encounter  Procedures  . Thoracentesis   No orders of the defined types were placed in this encounter.   Follow Up:    Return in about 2 weeks (around 03/06/2019), or if symptoms worsen or fail to improve, for Follow up with Wyn Quaker FNP-C, Follow up with Dr. Tamala Julian.   Please do your part to reduce the spread of COVID-19:      Reduce your risk of any infection  and COVID19 by using the similar precautions used for avoiding the common cold or flu:  Marland Kitchen Wash your hands often with soap and warm water for at least 20 seconds.  If soap and water are not readily available, use an alcohol-based hand sanitizer with at least 60% alcohol.  . If coughing or sneezing, cover your mouth and nose by coughing or sneezing into the elbow areas of your shirt or coat, into a tissue or into your sleeve (not your hands). Langley Gauss A MASK when in public  . Avoid shaking hands with others and consider head nods or verbal greetings only. . Avoid touching your eyes, nose, or mouth with unwashed hands.  . Avoid close contact with people who are sick. . Avoid places or events with large numbers of people in one location, like concerts or sporting events. . If you have some symptoms but not all symptoms, continue to monitor at home and seek medical attention if your symptoms worsen. . If you are having a medical emergency, call 911.   [place] / e-Visit: [email or web address]         [facility]: [facility]: [id number]                   [facility]: [id number]     It is flu season:   >>> Best ways to protect herself from the flu: Receive the yearly flu vaccine, practice good hand hygiene washing with soap and also using hand sanitizer when available, eat a nutritious meals, get adequate rest, hydrate appropriately   Please contact the office if your symptoms worsen or you have concerns that you are not improving.   Thank you for choosing [facility] for your healthcare, and for allowing [place] to partner with you on your healthcare journey. I am thankful to be able to provide care to you today.   Wyn Quaker FNP-C

## 2019-02-21 ENCOUNTER — Other Ambulatory Visit: Payer: Self-pay

## 2019-02-21 ENCOUNTER — Inpatient Hospital Stay: Payer: 59

## 2019-02-21 ENCOUNTER — Inpatient Hospital Stay (HOSPITAL_BASED_OUTPATIENT_CLINIC_OR_DEPARTMENT_OTHER): Payer: 59 | Admitting: Hematology and Oncology

## 2019-02-21 DIAGNOSIS — Z170 Estrogen receptor positive status [ER+]: Secondary | ICD-10-CM

## 2019-02-21 DIAGNOSIS — C50919 Malignant neoplasm of unspecified site of unspecified female breast: Secondary | ICD-10-CM

## 2019-02-21 DIAGNOSIS — C50311 Malignant neoplasm of lower-inner quadrant of right female breast: Secondary | ICD-10-CM

## 2019-02-21 DIAGNOSIS — Z5111 Encounter for antineoplastic chemotherapy: Secondary | ICD-10-CM | POA: Diagnosis not present

## 2019-02-21 DIAGNOSIS — Z95828 Presence of other vascular implants and grafts: Secondary | ICD-10-CM

## 2019-02-21 LAB — CBC WITH DIFFERENTIAL (CANCER CENTER ONLY)
Abs Immature Granulocytes: 0.01 10*3/uL (ref 0.00–0.07)
Basophils Absolute: 0 10*3/uL (ref 0.0–0.1)
Basophils Relative: 0 %
Eosinophils Absolute: 0 10*3/uL (ref 0.0–0.5)
Eosinophils Relative: 1 %
HCT: 31.8 % — ABNORMAL LOW (ref 36.0–46.0)
Hemoglobin: 11.1 g/dL — ABNORMAL LOW (ref 12.0–15.0)
Immature Granulocytes: 1 %
Lymphocytes Relative: 67 %
Lymphs Abs: 0.7 10*3/uL (ref 0.7–4.0)
MCH: 29.4 pg (ref 26.0–34.0)
MCHC: 34.9 g/dL (ref 30.0–36.0)
MCV: 84.1 fL (ref 80.0–100.0)
Monocytes Absolute: 0.1 10*3/uL (ref 0.1–1.0)
Monocytes Relative: 6 %
Neutro Abs: 0.3 10*3/uL — CL (ref 1.7–7.7)
Neutrophils Relative %: 25 %
Platelet Count: 14 10*3/uL — ABNORMAL LOW (ref 150–400)
RBC: 3.78 MIL/uL — ABNORMAL LOW (ref 3.87–5.11)
RDW: 14.7 % (ref 11.5–15.5)
WBC Count: 1.1 10*3/uL — ABNORMAL LOW (ref 4.0–10.5)
nRBC: 0 % (ref 0.0–0.2)

## 2019-02-21 LAB — CMP (CANCER CENTER ONLY)
ALT: 43 U/L (ref 0–44)
AST: 71 U/L — ABNORMAL HIGH (ref 15–41)
Albumin: 2.7 g/dL — ABNORMAL LOW (ref 3.5–5.0)
Alkaline Phosphatase: 459 U/L — ABNORMAL HIGH (ref 38–126)
Anion gap: 8 (ref 5–15)
BUN: 12 mg/dL (ref 6–20)
CO2: 29 mmol/L (ref 22–32)
Calcium: 8.4 mg/dL — ABNORMAL LOW (ref 8.9–10.3)
Chloride: 99 mmol/L (ref 98–111)
Creatinine: 0.74 mg/dL (ref 0.44–1.00)
GFR, Est AFR Am: >60 mL/min (ref >60)
GFR, Estimated: >60 mL/min (ref >60)
Glucose, Bld: 209 mg/dL — ABNORMAL HIGH (ref 70–99)
Potassium: 4.2 mmol/L (ref 3.5–5.1)
Sodium: 136 mmol/L (ref 135–145)
Total Bilirubin: 0.9 mg/dL (ref 0.3–1.2)
Total Protein: 5.7 g/dL — ABNORMAL LOW (ref 6.5–8.1)

## 2019-02-21 MED ORDER — FULVESTRANT 250 MG/5ML IM SOLN
500.0000 mg | Freq: Once | INTRAMUSCULAR | Status: AC
  Administered 2019-02-21: 500 mg via INTRAMUSCULAR

## 2019-02-21 MED ORDER — FULVESTRANT 250 MG/5ML IM SOLN
INTRAMUSCULAR | Status: AC
  Filled 2019-02-21: qty 5

## 2019-02-21 MED ORDER — DENOSUMAB 120 MG/1.7ML ~~LOC~~ SOLN
120.0000 mg | Freq: Once | SUBCUTANEOUS | Status: AC
  Administered 2019-02-21: 13:00:00 120 mg via SUBCUTANEOUS

## 2019-02-21 MED ORDER — DENOSUMAB 120 MG/1.7ML ~~LOC~~ SOLN
SUBCUTANEOUS | Status: AC
  Filled 2019-02-21: qty 1.7

## 2019-02-21 NOTE — Progress Notes (Signed)
Spoke with Melissa in pharmacy and she gave the corrected calcium level which was 9.4.

## 2019-02-21 NOTE — Assessment & Plan Note (Signed)
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 Diffuse bone marrow hypermetabolism worrisome for metastatic disease. Prior pericardial effusion resolved.  01/16/2019:Pleural effusion: Thoracentesis revealed metastatic adenocarcinoma breast primary ER 75%, PR 50%, HER-2 -1+ Bone marrow biopsy positive for metastatic breast cancer -------------------------------------------------------------------------------------------------------------------------------------------------------------- Current treatment: Letrozole 2.5 mg daily started 01/15/2019, Ibrance started 01/24/2019, Faslodex and Xgeva added 02/21/2019 Hospitalization: 02/03/19- 02/07/19 Malignant Pleural effusion S/P thoracentesis Plan:  1. Pleurex catheter placement as outpatient: Patient has appointment to see Dr. Rosana Hoes this Thursday. If the plan is to do the Pleurx catheter, we can give her platelet transfusion before her procedure. 2. Dec Ibrance to 75 mg  Severe anemia: Blood transfusion given. Right upper quadrant discomfort: Discussed with Dr. Donne Hazel who does not think she needs surgery for this.

## 2019-02-21 NOTE — Patient Instructions (Signed)
Denosumab injection What is this medicine? DENOSUMAB (den oh sue mab) slows bone breakdown. Prolia is used to treat osteoporosis in women after menopause and in men, and in people who are taking corticosteroids for 6 months or more. Xgeva is used to treat a high calcium level due to cancer and to prevent bone fractures and other bone problems caused by multiple myeloma or cancer bone metastases. Xgeva is also used to treat giant cell tumor of the bone. This medicine may be used for other purposes; ask your health care provider or pharmacist if you have questions. COMMON BRAND NAME(S): Prolia, XGEVA What should I tell my health care provider before I take this medicine? They need to know if you have any of these conditions:  dental disease  having surgery or tooth extraction  infection  kidney disease  low levels of calcium or Vitamin D in the blood  malnutrition  on hemodialysis  skin conditions or sensitivity  thyroid or parathyroid disease  an unusual reaction to denosumab, other medicines, foods, dyes, or preservatives  pregnant or trying to get pregnant  breast-feeding How should I use this medicine? This medicine is for injection under the skin. It is given by a health care professional in a hospital or clinic setting. A special MedGuide will be given to you before each treatment. Be sure to read this information carefully each time. For Prolia, talk to your pediatrician regarding the use of this medicine in children. Special care may be needed. For Xgeva, talk to your pediatrician regarding the use of this medicine in children. While this drug may be prescribed for children as young as 13 years for selected conditions, precautions do apply. Overdosage: If you think you have taken too much of this medicine contact a poison control center or emergency room at once. NOTE: This medicine is only for you. Do not share this medicine with others. What if I miss a dose? It is  important not to miss your dose. Call your doctor or health care professional if you are unable to keep an appointment. What may interact with this medicine? Do not take this medicine with any of the following medications:  other medicines containing denosumab This medicine may also interact with the following medications:  medicines that lower your chance of fighting infection  steroid medicines like prednisone or cortisone This list may not describe all possible interactions. Give your health care provider a list of all the medicines, herbs, non-prescription drugs, or dietary supplements you use. Also tell them if you smoke, drink alcohol, or use illegal drugs. Some items may interact with your medicine. What should I watch for while using this medicine? Visit your doctor or health care professional for regular checks on your progress. Your doctor or health care professional may order blood tests and other tests to see how you are doing. Call your doctor or health care professional for advice if you get a fever, chills or sore throat, or other symptoms of a cold or flu. Do not treat yourself. This drug may decrease your body's ability to fight infection. Try to avoid being around people who are sick. You should make sure you get enough calcium and vitamin D while you are taking this medicine, unless your doctor tells you not to. Discuss the foods you eat and the vitamins you take with your health care professional. See your dentist regularly. Brush and floss your teeth as directed. Before you have any dental work done, tell your dentist you are   receiving this medicine. Do not become pregnant while taking this medicine or for 5 months after stopping it. Talk with your doctor or health care professional about your birth control options while taking this medicine. Women should inform their doctor if they wish to become pregnant or think they might be pregnant. There is a potential for serious side  effects to an unborn child. Talk to your health care professional or pharmacist for more information. What side effects may I notice from receiving this medicine? Side effects that you should report to your doctor or health care professional as soon as possible:  allergic reactions like skin rash, itching or hives, swelling of the face, lips, or tongue  bone pain  breathing problems  dizziness  jaw pain, especially after dental work  redness, blistering, peeling of the skin  signs and symptoms of infection like fever or chills; cough; sore throat; pain or trouble passing urine  signs of low calcium like fast heartbeat, muscle cramps or muscle pain; pain, tingling, numbness in the hands or feet; seizures  unusual bleeding or bruising  unusually weak or tired Side effects that usually do not require medical attention (report to your doctor or health care professional if they continue or are bothersome):  constipation  diarrhea  headache  joint pain  loss of appetite  muscle pain  runny nose  tiredness  upset stomach This list may not describe all possible side effects. Call your doctor for medical advice about side effects. You may report side effects to FDA at 1-800-FDA-1088. Where should I keep my medicine? This medicine is only given in a clinic, doctor's office, or other health care setting and will not be stored at home. NOTE: This sheet is a summary. It may not cover all possible information. If you have questions about this medicine, talk to your doctor, pharmacist, or health care provider.  2020 Elsevier/Gold Standard (2017-08-03 16:10:44) Fulvestrant injection What is this medicine? FULVESTRANT (ful VES trant) blocks the effects of estrogen. It is used to treat breast cancer. This medicine may be used for other purposes; ask your health care provider or pharmacist if you have questions. COMMON BRAND NAME(S): FASLODEX What should I tell my health care  provider before I take this medicine? They need to know if you have any of these conditions:  bleeding disorders  liver disease  low blood counts, like low white cell, platelet, or red cell counts  an unusual or allergic reaction to fulvestrant, other medicines, foods, dyes, or preservatives  pregnant or trying to get pregnant  breast-feeding How should I use this medicine? This medicine is for injection into a muscle. It is usually given by a health care professional in a hospital or clinic setting. Talk to your pediatrician regarding the use of this medicine in children. Special care may be needed. Overdosage: If you think you have taken too much of this medicine contact a poison control center or emergency room at once. NOTE: This medicine is only for you. Do not share this medicine with others. What if I miss a dose? It is important not to miss your dose. Call your doctor or health care professional if you are unable to keep an appointment. What may interact with this medicine?  medicines that treat or prevent blood clots like warfarin, enoxaparin, dalteparin, apixaban, dabigatran, and rivaroxaban This list may not describe all possible interactions. Give your health care provider a list of all the medicines, herbs, non-prescription drugs, or dietary supplements you use.   Also tell them if you smoke, drink alcohol, or use illegal drugs. Some items may interact with your medicine. What should I watch for while using this medicine? Your condition will be monitored carefully while you are receiving this medicine. You will need important blood work done while you are taking this medicine. Do not become pregnant while taking this medicine or for at least 1 year after stopping it. Women of child-bearing potential will need to have a negative pregnancy test before starting this medicine. Women should inform their doctor if they wish to become pregnant or think they might be pregnant. There is  a potential for serious side effects to an unborn child. Men should inform their doctors if they wish to father a child. This medicine may lower sperm counts. Talk to your health care professional or pharmacist for more information. Do not breast-feed an infant while taking this medicine or for 1 year after the last dose. What side effects may I notice from receiving this medicine? Side effects that you should report to your doctor or health care professional as soon as possible:  allergic reactions like skin rash, itching or hives, swelling of the face, lips, or tongue  feeling faint or lightheaded, falls  pain, tingling, numbness, or weakness in the legs  signs and symptoms of infection like fever or chills; cough; flu-like symptoms; sore throat  vaginal bleeding Side effects that usually do not require medical attention (report to your doctor or health care professional if they continue or are bothersome):  aches, pains  constipation  diarrhea  headache  hot flashes  nausea, vomiting  pain at site where injected  stomach pain This list may not describe all possible side effects. Call your doctor for medical advice about side effects. You may report side effects to FDA at 1-800-FDA-1088. Where should I keep my medicine? This drug is given in a hospital or clinic and will not be stored at home. NOTE: This sheet is a summary. It may not cover all possible information. If you have questions about this medicine, talk to your doctor, pharmacist, or health care provider.  2020 Elsevier/Gold Standard (2017-07-05 11:34:41)  

## 2019-02-25 NOTE — Progress Notes (Signed)
Patient Care Team: Aletha Halim., PA-C as PCP - General (Family Medicine) Nicholas Lose, MD as Consulting Physician (Hematology and Oncology) Kyung Rudd, MD as Consulting Physician (Radiation Oncology) Rolm Bookbinder, MD as Consulting Physician (General Surgery)  DIAGNOSIS:    ICD-10-CM   1. Malignant neoplasm of lower-inner quadrant of right breast of female, estrogen receptor positive ([place])  C50.311    Z17.0     SUMMARY OF ONCOLOGIC HISTORY: Oncology History  Malignant neoplasm of lower-inner quadrant of right breast of female, estrogen receptor positive ([place])  08/16/2017 Initial Diagnosis   Right lumpectomy: 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  High risk   08/16/2017 Cancer Staging   Staging form: Breast, AJCC 8th Edition - Pathologic stage from 08/16/2017: Stage IA (pT1c, pN0, cM0, G2, ER+, PR+, HER2-, Oncotype DX score: 31) - Signed by Gardenia Phlegm, NP on 09/12/2018   09/25/2017 - 02/05/2018 Chemotherapy   Adjuvant chemotherapy with dose dense Adriamycin and Cytoxan x4 followed by Taxol weekly x12    03/11/2018 - 04/25/2018 Radiation Therapy   Adjuvant XRT   01/09/2019 PET scan   Pulmonary hypermetabolic and consistent with lymphangitic tumor spread, confluent right middle lobe consolidation, hypermetabolic abdominal, cervical and left axillary lymph nodes, diffuse liver hypermetabolic some nonspecific.  Diffuse bone marrow hypermetabolism worrisome for metastatic disease.  Prior pericardial effusion resolved.   01/16/2019 Relapse/Recurrence   Pleural effusion: Thoracentesis revealed metastatic adenocarcinoma breast primary ER 75%, PR 50%, HER-2 -1+ Bone marrow biopsy positive for metastatic breast cancer     CHIEF  COMPLIANT: Follow-up of metastatic breast cancer  INTERVAL HISTORY: JANNET CALIP is a 53 y.o. 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 She is currently on treatment with letrozole, Ibrance, and Faslodex.She presents to the clinic todayfor follow-up and a toxicity check.   She reports that she is feeling remarkably better compared to last week.  She has more energy.  She continues to use oxygen by nasal cannula and has not had any fevers or chills or nausea or vomiting.  REVIEW OF SYSTEMS:   Constitutional: Denies fevers, chills or abnormal weight loss Eyes: Denies blurriness of vision Ears, nose, mouth, throat, and face: Denies mucositis or sore throat Respiratory: Denies cough, dyspnea or wheezes Cardiovascular: Denies palpitation, chest discomfort Gastrointestinal: Denies nausea, heartburn or change in bowel habits Skin: Denies abnormal skin rashes Lymphatics: Denies new lymphadenopathy or easy bruising Neurological: Denies numbness, tingling or new weaknesses Behavioral/Psych: Mood is stable, no new changes  Extremities: No lower extremity edema Breast: denies any pain or lumps or nodules in either breasts All other systems were reviewed with the patient and are negative.  I have reviewed the past medical history, past surgical history, social history and family history with the patient and they are unchanged from previous note.  ALLERGIES:  is allergic to amoxicillin-pot clavulanate; erythromycin; metoclopramide hcl; and sulfonamide derivatives.  MEDICATIONS:  Current Outpatient Medications  Medication Sig Dispense Refill  . albuterol (PROVENTIL) (2.5 MG/3ML) 0.083% nebulizer solution Inhale 3 mLs into the lungs every 6 (six) hours as needed for wheezing or shortness of breath (cough). 360 mL 0  . ALPRAZolam (XANAX) 0.25 MG tablet Take 1 tablet (0.25 mg  total) by mouth at bedtime as needed for anxiety. 30 tablet  0  . atorvastatin (LIPITOR) 20 MG tablet Take 20 mg by mouth at bedtime.     . Biotin 10000 MCG TABS Take 10,000 mcg by mouth daily.     . bumetanide (BUMEX) 1 MG tablet TAKE 1 TABLET(1 MG) BY MOUTH TWICE DAILY (Patient taking differently: Take 1 mg by mouth 2 (two) times daily. ) 60 tablet 2  . cetirizine (ZYRTEC) 10 MG tablet Take 10 mg by mouth daily.    . chlorpheniramine-HYDROcodone (TUSSIONEX PENNKINETIC ER) 10-8 MG/5ML SUER Take 5 mLs by mouth 2 (two) times daily.    . cyclobenzaprine (FLEXERIL) 10 MG tablet Take 10 mg by mouth every 8 (eight) hours as needed for muscle spasms.  1  . hydrOXYzine (ATARAX/VISTARIL) 10 MG tablet Take 10-30 mg by mouth at bedtime as needed for itching.   0  . hyoscyamine (LEVSIN SL) 0.125 MG SL tablet dissolve 1 tablet under the tongue every 4 hours if needed (Patient taking differently: Take 0.125 mg by mouth every 4 (four) hours as needed for cramping. ) 30 tablet 0  . letrozole (FEMARA) 2.5 MG tablet Take 1 tablet (2.5 mg total) by mouth daily. 90 tablet 3  . LORazepam (ATIVAN) 0.5 MG tablet Take 1 tablet (0.5 mg total) by mouth every 8 (eight) hours. 30 tablet 0  . morphine (MS CONTIN) 15 MG 12 hr tablet Take 1 tablet (15 mg total) by mouth every 12 (twelve) hours. 60 tablet 0  . morphine (MSIR) 15 MG tablet Take 1 tablet (15 mg total) by mouth every 6 (six) hours as needed for severe pain. 60 tablet 0  . Multiple Vitamin (MULITIVITAMIN WITH MINERALS) TABS Take 1 tablet by mouth daily.    . palbociclib (IBRANCE) 75 MG tablet Take 75 mg by mouth daily. Take for 21 days on, 7 days off, repeat every 28 days.    . pantoprazole (PROTONIX) 40 MG tablet Take 1 tablet (40 mg total) by mouth 2 (two) times daily before a meal. (Patient taking differently: Take 40 mg by mouth 2 (two) times daily. ) 60 tablet 6  . polyethylene glycol (MIRALAX / GLYCOLAX) 17 g packet Take 17 g by mouth 2 (two) times daily. Decrease to daily if having watery or multiple loose stools daily.  30 each 0  . potassium chloride SA (KLOR-CON) 20 MEQ tablet Take 1 tablet (20 mEq total) by mouth daily. 30 tablet 1  . traZODone (DESYREL) 100 MG tablet Take 150 mg by mouth at bedtime.      No current facility-administered medications for this visit.     PHYSICAL EXAMINATION: ECOG PERFORMANCE STATUS: 1 - Symptomatic but completely ambulatory  Vitals:   02/26/19 1343  BP: 134/84  Pulse: 93  Resp: 18  Temp: 98.2 F (36.8 C)  SpO2: 100%   Filed Weights   02/26/19 1343  Weight: 114 lb 4.8 oz (51.8 kg)    GENERAL: alert, no distress and comfortable SKIN: skin color, texture, turgor are normal, no rashes or significant lesions EYES: normal, Conjunctiva are pink and non-injected, sclera clear OROPHARYNX: no exudate, no erythema and lips, buccal mucosa, and tongue normal  NECK: supple, thyroid normal size, non-tender, without nodularity LYMPH: no palpable lymphadenopathy in the cervical, axillary or inguinal LUNGS: clear to auscultation and percussion with normal breathing effort HEART: regular rate & rhythm and no murmurs and no lower extremity edema ABDOMEN: abdomen soft, non-tender and normal bowel sounds MUSCULOSKELETAL: no cyanosis  of digits and no clubbing  NEURO: alert & oriented x 3 with fluent speech, no focal motor/sensory deficits EXTREMITIES: No lower extremity edema  LABORATORY DATA:  I have reviewed the data as listed CMP Latest Ref Rng & Units 02/26/2019 02/21/2019 02/13/2019  Glucose 70 - 99 mg/dL 216(H) 209(H) 206(H)  BUN 6 - 20 mg/dL 10 12 10   Creatinine 0.44 - 1.00 mg/dL 0.73 0.74 0.81  Sodium 135 - 145 mmol/L 136 136 136  Potassium 3.5 - 5.1 mmol/L 3.7 4.2 2.9(LL)  Chloride 98 - 111 mmol/L 100 99 94(L)  CO2 22 - 32 mmol/L 25 29 27   Calcium 8.9 - 10.3 mg/dL 6.6(L) 8.4(L) 8.6(L)  Total Protein 6.5 - 8.1 g/dL 5.7(L) 5.7(L) 6.1(L)  Total Bilirubin 0.3 - 1.2 mg/dL 0.8 0.9 0.7  Alkaline Phos 38 - 126 U/L 420(H) 459(H) 567(H)  AST 15 - 41 U/L 76(H) 71(H) 76(H)   ALT 0 - 44 U/L 48(H) 43 43    Lab Results  Component Value Date   WBC 1.4 (L) 02/26/2019   HGB 10.7 (L) 02/26/2019   HCT 30.2 (L) 02/26/2019   MCV 85.3 02/26/2019   PLT 25 (L) 02/26/2019   NEUTROABS 0.3 (LL) 02/26/2019    ASSESSMENT & PLAN:  Malignant neoplasm of lower-inner quadrant of right breast of female, estrogen receptor positive ([place]) 08/16/2017:Right lumpectomy: Grade 2 IDC 1.5 cm, with DCIS and necrosis, 0/3 lymph nodes negative, ER 95%, PR 30%, HER-2 negative ratio 1.14, Ki-67 40%, T1CN0 stage Ia; resection of the margin 09/11/2017: Benign  01/09/2019: PET CT scan:Pulmonary hypermetabolic and consistent with lymphangitic tumor spread, confluent right middle lobe consolidation, hypermetabolic abdominal, cervical and left axillary lymph nodes, diffuse liver hypermetabolic some nonspecific. Diffuse bone marrow hypermetabolism worrisome for metastatic disease. Prior pericardial effusion resolved.  01/16/2019:Pleural effusion: Thoracentesis revealed metastatic adenocarcinoma breast primary ER 75%, PR 50%, HER-2 -1+ Bone marrow biopsy positive for metastatic breast cancer  [facility] molecular testing: ER/PR positive HER-2 negative, ER positive, TMB low (1), BRCA 1 and 2 -, ESR 1 mutation not detected, PI K3 CA negative PD-L1 by guardant will be obtained -------------------------------------------------------------------------------------------------------------------------------------------------------------- Current treatment: Letrozole 2.5 mg daily started 01/15/2019, Ibrance started 01/24/2019, Faslodex and Xgeva added 02/21/2019 Hospitalization: 02/03/19- 02/07/19 Malignant Pleural effusion S/P thoracentesis Plan:  1.  Faslodex will be added to the treatment 2.  Continue Ibrance 75 mg 3.  Xgeva q 3 months  Lab review: ANC 0.3 but platelets improved to 25 Therefore we elected to continue with the same dosage   We decided to continue with treatment in spite of low counts.  She  understands risks of this approach.  Severe anemia: Previously given blood transfusion, monitoring closely. Return to clinic along with her injection appointment to recheck her labs and follow-up.  She understands everything about neutropenic precautions.   No orders of the defined types were placed in this encounter.  The patient has a good understanding of the overall plan. she agrees with it. she will call with any problems that may develop before the next visit here.  Nicholas Lose, MD 02/26/2019  Julious Oka Dorshimer, am acting as scribe for Dr. Nicholas Lose.  I have reviewed the above documentation for accuracy and completeness, and I agree with the above.

## 2019-02-26 ENCOUNTER — Inpatient Hospital Stay (HOSPITAL_BASED_OUTPATIENT_CLINIC_OR_DEPARTMENT_OTHER): Payer: 59 | Admitting: Hematology and Oncology

## 2019-02-26 ENCOUNTER — Inpatient Hospital Stay: Payer: 59

## 2019-02-26 ENCOUNTER — Other Ambulatory Visit: Payer: Self-pay

## 2019-02-26 DIAGNOSIS — C50311 Malignant neoplasm of lower-inner quadrant of right female breast: Secondary | ICD-10-CM | POA: Diagnosis not present

## 2019-02-26 DIAGNOSIS — Z170 Estrogen receptor positive status [ER+]: Secondary | ICD-10-CM

## 2019-02-26 DIAGNOSIS — Z5111 Encounter for antineoplastic chemotherapy: Secondary | ICD-10-CM | POA: Diagnosis not present

## 2019-02-26 LAB — CBC WITH DIFFERENTIAL (CANCER CENTER ONLY)
Abs Immature Granulocytes: 0.01 10*3/uL (ref 0.00–0.07)
Basophils Absolute: 0 10*3/uL (ref 0.0–0.1)
Basophils Relative: 1 %
Eosinophils Absolute: 0 10*3/uL (ref 0.0–0.5)
Eosinophils Relative: 0 %
HCT: 30.2 % — ABNORMAL LOW (ref 36.0–46.0)
Hemoglobin: 10.7 g/dL — ABNORMAL LOW (ref 12.0–15.0)
Immature Granulocytes: 1 %
Lymphocytes Relative: 68 %
Lymphs Abs: 1 10*3/uL (ref 0.7–4.0)
MCH: 30.2 pg (ref 26.0–34.0)
MCHC: 35.4 g/dL (ref 30.0–36.0)
MCV: 85.3 fL (ref 80.0–100.0)
Monocytes Absolute: 0.1 10*3/uL (ref 0.1–1.0)
Monocytes Relative: 8 %
Neutro Abs: 0.3 10*3/uL — CL (ref 1.7–7.7)
Neutrophils Relative %: 22 %
Platelet Count: 25 10*3/uL — ABNORMAL LOW (ref 150–400)
RBC: 3.54 MIL/uL — ABNORMAL LOW (ref 3.87–5.11)
RDW: 15.5 % (ref 11.5–15.5)
WBC Count: 1.4 10*3/uL — ABNORMAL LOW (ref 4.0–10.5)
nRBC: 1.4 % — ABNORMAL HIGH (ref 0.0–0.2)

## 2019-02-26 LAB — CMP (CANCER CENTER ONLY)
ALT: 48 U/L — ABNORMAL HIGH (ref 0–44)
AST: 76 U/L — ABNORMAL HIGH (ref 15–41)
Albumin: 2.8 g/dL — ABNORMAL LOW (ref 3.5–5.0)
Alkaline Phosphatase: 420 U/L — ABNORMAL HIGH (ref 38–126)
Anion gap: 11 (ref 5–15)
BUN: 10 mg/dL (ref 6–20)
CO2: 25 mmol/L (ref 22–32)
Calcium: 6.6 mg/dL — ABNORMAL LOW (ref 8.9–10.3)
Chloride: 100 mmol/L (ref 98–111)
Creatinine: 0.73 mg/dL (ref 0.44–1.00)
GFR, Est AFR Am: >60 mL/min (ref >60)
GFR, Estimated: >60 mL/min (ref >60)
Glucose, Bld: 216 mg/dL — ABNORMAL HIGH (ref 70–99)
Potassium: 3.7 mmol/L (ref 3.5–5.1)
Sodium: 136 mmol/L (ref 135–145)
Total Bilirubin: 0.8 mg/dL (ref 0.3–1.2)
Total Protein: 5.7 g/dL — ABNORMAL LOW (ref 6.5–8.1)

## 2019-02-26 LAB — SAMPLE TO BLOOD BANK

## 2019-02-26 NOTE — Progress Notes (Signed)
CRITICAL VALUE ALERT  Critical Value:  ANC 0.3  Date & Time Notied:  02/26/2019 at 2:00 pm  Provider Notified: Nicholas Lose, MD  Orders Received/Actions taken: MD aware

## 2019-02-26 NOTE — Assessment & Plan Note (Signed)
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 Diffuse bone marrow hypermetabolism worrisome for metastatic disease. Prior pericardial effusion resolved.  01/16/2019:Pleural effusion: Thoracentesis revealed metastatic adenocarcinoma breast primary ER 75%, PR 50%, HER-2 -1+ Bone marrow biopsy positive for metastatic breast cancer  [facility] molecular testing: ER/PR positive HER-2 negative, ER positive, TMB low (1), BRCA 1 and 2 -, ESR 1 mutation not detected, PI K3 CA negative PD-L1 by guardant will be obtained -------------------------------------------------------------------------------------------------------------------------------------------------------------- Current treatment: Letrozole 2.5 mg daily started 01/15/2019, Ibrance started 01/24/2019, Faslodex and Xgeva added 02/21/2019 Hospitalization: 02/03/19- 02/07/19 Malignant Pleural effusion S/P thoracentesis Plan:  1.  Faslodex will be added to the treatment 2.  Continue Ibrance 75 mg 3.  Xgeva q 3 months  We had a lengthy conversation about Ibrance versus Verzenio. If her blood counts next week remain low then we will switch her to [employer]. Continued problems with severe thrombocytopenia and the severe leukopenia: ANC today is 0.3 and platelets are only 14. This is because of bone marrow infiltration by malignancy. We decided to continue with treatment in spite of low counts.  She understands risks of this approach.  Severe anemia: Blood transfusion given.

## 2019-02-27 ENCOUNTER — Ambulatory Visit: Payer: 59 | Admitting: Hematology and Oncology

## 2019-02-27 ENCOUNTER — Telehealth: Payer: Self-pay | Admitting: Hematology and Oncology

## 2019-02-27 ENCOUNTER — Other Ambulatory Visit: Payer: 59

## 2019-02-27 NOTE — Telephone Encounter (Signed)
I left a message regarding schedule  

## 2019-02-28 ENCOUNTER — Ambulatory Visit: Payer: 59

## 2019-02-28 ENCOUNTER — Other Ambulatory Visit: Payer: 59 | Admitting: Internal Medicine

## 2019-02-28 ENCOUNTER — Other Ambulatory Visit: Payer: Self-pay

## 2019-03-02 ENCOUNTER — Telehealth: Payer: Self-pay | Admitting: Pulmonary Disease

## 2019-03-02 NOTE — Telephone Encounter (Signed)
Breathing is worse Last thoracentesis was in the clinic on 11/12-she was told to call if breathing got worse -I offered her chest x-ray on Monday morning to follow-up on effusion and further plan based on if there is recurrence.  She would like to hold off on Pleurx catheter until after the holidays although she has had multiple thoracenteses

## 2019-03-03 ENCOUNTER — Other Ambulatory Visit: Payer: Self-pay | Admitting: Internal Medicine

## 2019-03-03 ENCOUNTER — Ambulatory Visit (INDEPENDENT_AMBULATORY_CARE_PROVIDER_SITE_OTHER): Payer: 59

## 2019-03-03 ENCOUNTER — Other Ambulatory Visit: Payer: Self-pay

## 2019-03-03 ENCOUNTER — Ambulatory Visit (INDEPENDENT_AMBULATORY_CARE_PROVIDER_SITE_OTHER): Payer: 59 | Admitting: Internal Medicine

## 2019-03-03 ENCOUNTER — Telehealth: Payer: Self-pay

## 2019-03-03 ENCOUNTER — Encounter: Payer: Self-pay | Admitting: Internal Medicine

## 2019-03-03 VITALS — BP 114/78 | HR 87 | Ht 60.0 in | Wt 115.2 lb

## 2019-03-03 DIAGNOSIS — J90 Pleural effusion, not elsewhere classified: Secondary | ICD-10-CM | POA: Diagnosis not present

## 2019-03-03 DIAGNOSIS — Z9889 Other specified postprocedural states: Secondary | ICD-10-CM | POA: Diagnosis not present

## 2019-03-03 NOTE — Telephone Encounter (Signed)
Patient tolerated thoracentesis in the office today with 863ml drained. She is agreeable to Pleurx placement. I've told her that Dr. Tamala Julian will be in touch with her this week about when to get this scheduled.

## 2019-03-03 NOTE — Progress Notes (Signed)
THORACENTESIS PROCEDURE NOTE  Indications: malignant pleural effusion  Consent obtained: yes, scanned into chart   Operators:  1) Spero Geralds   Imaging reviewed pre-procedure: chest xray  Ultrasound guidance: yes,  Images saved: yes, in office [facility]  Site of procedure (R or L): Right  Pre-procedure diagnosis: pleural effusion  Time Out: Prior to the beginning of the procedure, the team paused to verify the patient's identity, the procedure to be performed (in accordance with the consent obtained prior to procedure,) and the correct side/site. This information was verified by the patient's nurse and the patient himself/herself.   Procedure: The patient was positioned appropriately. The site was prepped with chlorhexidine and sterilely draped. Using a 22 gauge needle 10 mL of 1% lidocaine was injected subcutaneously to raise a wheel, and then anesthetize the needle tract over the superior border of the rib. The pleural space was entered and the pleura anesthetized. An 18 gauge needle was then advanced through the anesthetized tract. Once pleural fluid was aspirated, a catheter was advanced over the needle and into the pleural space and an adequate sample of pleural fluid was removed. The catheter was withdrawn at end-expiration with the patient holding his/her breath and covered with an occlusive dressing. The patient tolerated the procedure well and without complication.  The procedure was aborted due to pain.   Findings: Fluid appearance: serous Volume of pleural fluid removed: 800 mL Number of attempts: 1   I was present for the entire procedure. There were no immediate or anticipated complications. A chest xray was ordered following the procedure.   I spent 50 minutes with the patient.

## 2019-03-03 NOTE — Telephone Encounter (Signed)
03/03/2019 1043  I have spoken with Dr. Shearon Stalls.  I have also spoken with the patient.  Plan is for office thoracentesis.  All the supplies have been accounted for.   Dr. Shearon Stalls will speak with the patient regarding Pleurx catheter placement.  Wyn Quaker, FNP

## 2019-03-03 NOTE — Progress Notes (Signed)
[name] is a 53 y.o. woman with a past medical history of metastatic breast cancer with malignancy pleural effusion. Over the weekend she developed worsening shortness of breath, cough, and she does endorse worsening right upper back pain.   Blood pressure 114/78, pulse 87, height 5' (1.524 m), weight 115 lb 3.2 oz (52.3 kg), last menstrual period 12/24/2010, SpO2 99 %.   On exam she is well appearing, there are diminished breath sounds on the right lung base, no increased respiratory effort, symmetric chest wall excursion. There is no pedal edema.   The patient's past medical family and social histories were reviewed and updated as appropriate.   I reviewed personally her chest xray today which shows worsening right sided pleural effusion.  Together with the patient today we made the shared medical decision of proceeding with thoracentesis.  See separate procedure note.    1. Malignant Pleural Effusion - thoracentesis today - She is interested in pleurx in the future. - Dr. Tamala Julian to arrange

## 2019-03-03 NOTE — Telephone Encounter (Signed)
03/03/2019 1018  I have communicated the patient's symptoms as well as recent chest x-ray results with Dr. Tamala Julian.  Dr. Tamala Julian recommends seeing if we can proceed forward with outpatient thoracentesis in office with ultrasound if Dr. Shearon Stalls is able to perform this.  We do have an ultrasound in office.  We are looking to see if we have all the correct supplies such as a Thora tray.  Administration as well as clinical team lead Ander Purpura is working on this.  I still need to speak with Dr. Shearon Stalls.  Wyn Quaker, FNP

## 2019-03-03 NOTE — Telephone Encounter (Signed)
Noted. If pt does not come in by end of day, will give her a call.   Will leave in triage for now.

## 2019-03-03 NOTE — Telephone Encounter (Signed)
Thank you Dr. Shearon Stalls for coordinating and letting the patient today.Dr. Tamala Julian please let me know if there is anything I can do to help with coordinating getting the patient scheduled for Pleurx placement.Wyn Quaker, FNP

## 2019-03-03 NOTE — Telephone Encounter (Signed)
Received update from [facility] NP that Palliative visit is rescheduled for 04/22/2019

## 2019-03-04 ENCOUNTER — Telehealth: Payer: Self-pay | Admitting: Internal Medicine

## 2019-03-04 ENCOUNTER — Telehealth: Payer: Self-pay

## 2019-03-04 NOTE — Telephone Encounter (Signed)
Called pt and advised message from the provider. Pt understood and verbalized understanding. Nothing further is needed.   She will wait to be seen tomorrow.

## 2019-03-04 NOTE — Telephone Encounter (Signed)
03/04/2019 1613  Thank you [place] for working on this. We need to work to get the patient scheduled for a Pleurx placement procedure on either December 3 or December 4 at either 730 or 8 AM.  This is the time that Dr. Tamala Julian would be available.  Patient will also need outpatient pre procedure Covid testing prior to this procedure.  Patient will be seen in office tomorrow 03/05/2019.  We can work with the patient on schedule at that point in time.  Wyn Quaker, FNP

## 2019-03-04 NOTE — Telephone Encounter (Signed)
Difficult to fully say based off the information provided.  Definitely can evaluate the patient in office if that some and she would prefer to have happen.  She can schedule an office visit at her earliest convenience to see me where we can have a baseline vital signs.  The amount of thoracentesis that she continues to receive can definitely continue to increase and become more painful.  This is why we chronically need to place a Pleurx catheter that I know the patient spoke with Dr. Shearon Stalls about yesterday.  She continues to have reaccumulation of fluid very quickly.  Wyn Quaker, FNP

## 2019-03-04 NOTE — Telephone Encounter (Signed)
LMTCB with Katie with Respiratory Department.   Will route message to myself to f/u.

## 2019-03-04 NOTE — Progress Notes (Signed)
@Patient  ID: Jasmine Allen, female    DOB: [birth year], 53 y.o.   MRN: [id number]  Chief Complaint  Patient presents with   Follow-up    2 week follow     Referring provider: Aletha Halim., PA-C  HPI:  53 year old female followed in our office for recurrent malignant right-sided pleural effusion.  Patient with advanced breast cancer.  Metastatic.  Currently receiving chemotherapy.  PMH: Anemia, depression, GERD, metastatic breast cancer Smoker/ Smoking History: Former smoker Maintenance: None Pt of: Dr. Tamala Julian  03/05/2019  - Visit   53 year old female former smoker followed in our office for recurrent malignant right-sided pleural effusion directly related to patient's advanced metastatic breast cancer.  Patient is currently receiving chemotherapy.  She is managed by both [facility] as well as [facility] Dr. Lindi Adie.  Patient has received 2 therapeutic thoracentesis in our office over the last month.  Most recent being on 03/03/2019.  800 cc were drained from her right side of her chest.  This is her fifth thoracentesis since 01/13/2019.  Our office has worked to coordinate a Pleurx to be placed on 03/11/2019 at 2 PM at [facility].  This to be performed by Dr. Carlis Abbott.  We will discuss this today.  Patient presents today with her best friend.  They are reporting that overall she is doing okay.  She feels that she has improved some of her breathing since last being seen on 03/03/2019.  She still continues to be apprehensive regarding a Pleurx catheter being placed.  Questionaires / Pulmonary Flowsheets:   MMRC: mMRC Dyspnea Scale mMRC Score  03/05/2019 2  02/20/2019 2    Tests:   01/10/2019-PET scan-pulmonary hypermetabolic zone which is suspicious for lymphangitic tumor spread, more confluent right middle lobe hypermetabolic consolidation, also suspicious for metastatic disease versus less likely infection, hypermetabolic abdominal and cervical and possible left axial nodes  suspicious for metastatic disease, diffuse hepatic hypermetabolic activity which is nonspecific, diffuse heterogeneous marrow hypermetabolic them which is especially given the appearance of multifocal sclerosis since 10/30/2015 is most consistent with metastatic disease, small bilateral pleural effusions persist  02/03/2019-CT angio chest-large right and moderate left pleural effusion which have progressed from PET scan on 01/09/2019, fluid is dependent, layering, negative for PE, extensive metastatic disease is recently stage  02/06/2019-CT chest without contrast-right pleural effusion is significantly smaller compared to prior exam, consistent with recent thoracentesis, left pleural effusion is not significantly changed, gross stable reticular densities are noted throughout both lungs with focal consolidation seen in the right middle lobe which is slightly improved compared to prior exam, concerning for lymphangitic spread of malignancy, new focal groundglass opacity noted in the lingular segment of left upper lobe which may represent focal inflammation  01/13/2019 - thoracentesis - Right - 400 cc cloudy fluid   02/03/2019-thoracentesis-right - 600 mL of clear yellow fluid was removed  02/06/2019-thoracentesis-right - 500 cc clear amber fluid removed  02/20/2019-thoracentesis-right-600 cc clear yellow/amber fluid removed  03/03/2019-thoracentesis-right-800 cc serous fluid  02/03/2019-pleural fluid-No growth  02/13/2019-chest x-ray-pleural effusions are stable and small volume, more focal appearing right middle lobe density on the frontal view but stable on lateral view, please ensure no interval pneumonia symptoms  02/20/2019-chest x-ray-stable small bilateral pleural effusions, slight increase right effusion when compared to 02/13/2019  02/20/2019-chest x-ray 1 view-coarse interstitial markings, patchy opacities in the lungs bilaterally right greater than left, similar to recent prior  examination, no pleural effusion identified, no pneumothorax  01/07/2019-echocardiogram-LV ejection fraction 60 to 65%, global right  ventricle has systolic function  FENO:  No results found for: NITRICOXIDE  PFT: No flowsheet data found.  WALK:  No flowsheet data found.  Imaging: Dg Chest 1 View  Result Date: 03/03/2019 CLINICAL DATA:  53 year old female status post right side thoracentesis. Metastatic breast cancer. EXAM: CHEST  1 VIEW COMPARISON:  0936 hours today. FINDINGS: PA chest at 1159 hours. Improved right lung base ventilation and no pneumothorax identified. Coarse and patchy bilateral pulmonary opacity otherwise stable. Stable cardiac size and mediastinal contours. Visualized tracheal air column is within normal limits. Dextroconvex thoracic scoliosis. No acute osseous abnormality identified. Negative visible bowel gas pattern. IMPRESSION: 1. No pneumothorax and improved right lung base ventilation following thoracentesis. 2. Otherwise stable chest. Electronically Signed   By: Genevie Ann M.D.   On: 03/03/2019 12:05   Dg Chest 1 View  Result Date: 02/20/2019 CLINICAL DATA:  53 year old female status post right thoracentesis. History of pleural effusion. EXAM: CHEST  1 VIEW COMPARISON:  Chest x-ray 02/20/2019 at 10:53 a.m. FINDINGS: Lung volumes are slightly low. Widespread interstitial prominence and patchy areas of more coarse reticular opacities and ill-defined airspace opacities, most evident in the right mid lung, similar to the recent prior examination. No definite pleural effusions. No evidence of pulmonary edema. Heart size is normal. Upper mediastinal contours are within normal limits. IMPRESSION: 1. Coarse interstitial markings and patchy opacities in the lungs bilaterally (right greater than left), similar to recent prior examination, corresponding to areas of potential fibrosis or lymphangitic spread of malignancy better demonstrated on prior chest CT. No new acute findings. 2.  No pleural effusion identified.  No pneumothorax. Electronically Signed   By: Vinnie Langton M.D.   On: 02/20/2019 14:16   Dg Chest 1 View  Result Date: 02/06/2019 CLINICAL DATA:  Status post right thoracentesis. EXAM: CHEST  1 VIEW COMPARISON:  Yesterday. FINDINGS: Stable mildly enlarged cardiac silhouette and diffusely prominent interstitial markings. Decreased ill-defined oval density in the right lower lung zone. Mild increase in size of a small left pleural effusion. No visible right pleural fluid. No pneumothorax. Stable mild to moderate thoracolumbar scoliosis. IMPRESSION: 1. No pneumothorax following thoracentesis. 2. Mild increase in size of a small left pleural effusion. 3. Decreased ill-defined oval density in the right lower lung zone. Electronically Signed   By: Claudie Revering M.D.   On: 02/06/2019 10:18   Dg Chest 2 View  Result Date: 03/03/2019 CLINICAL DATA:  Shortness of breath and cough.  Breast carcinoma EXAM: CHEST - 2 VIEW COMPARISON:  February 20, 2019; chest CT February 06, 2019 FINDINGS: There remain patchy areas of somewhat ill-defined consolidation as well as diffuse interstitial prominence, stable. No new opacity evident. Heart is upper normal in size with pulmonary vascularity normal. No adenopathy. Multifocal bony sclerosis is noted consistent with known breast carcinoma. There is scoliosis. IMPRESSION: Persistent patchy areas of opacity and interstitial prominence, concerning for lymphangitic spread of tumor. No new opacity evident. Stable cardiac silhouette. Sclerotic bony lesions consistent with known breast carcinoma. Electronically Signed   By: Lowella Grip III M.D.   On: 03/03/2019 09:46   Chest Xray  Result Date: 02/20/2019 CLINICAL DATA:  Dyspnea, breast cancer, recent right thoracentesis EXAM: CHEST - 2 VIEW COMPARISON:  02/13/2019 chest radiograph. FINDINGS: Stable cardiomediastinal silhouette with normal heart size. No pneumothorax. Small bilateral pleural  effusions, stable. Patchy and reticular opacities throughout both lungs, most prominent in the anterior right mid lung, unchanged. IMPRESSION: 1. Stable small bilateral pleural effusions. 2. Patchy and reticular  bilateral lung opacities are unchanged, suspicious for lymphangitic carcinomatosis as reported on recent chest CT. Electronically Signed   By: Ilona Sorrel M.D.   On: 02/20/2019 11:11   Dg Chest 2 View  Result Date: 02/13/2019 CLINICAL DATA:  Follow-up pleural effusion EXAM: CHEST - 2 VIEW COMPARISON:  02/06/2019 FINDINGS: Small bilateral pleural effusion are unchanged. Stable reticulonodular opacities on both sides with focal airspace opacity below the right minor fissure. Normal heart size. Scoliosis. IMPRESSION: 1. Pleural effusions are stable and small volume. 2. More focal appearing right middle lobe density on the frontal view but stable on the lateral view. Please ensure no interval pneumonia symptoms. Electronically Signed   By: Monte Fantasia M.D.   On: 02/13/2019 11:42   Dg Chest 2 View  Result Date: 02/05/2019 CLINICAL DATA:  History of metastatic breast cancer.  Severe pain. EXAM: CHEST - 2 VIEW COMPARISON:  02/03/2019.  Single-view of the chest and CT chest FINDINGS: Right pleural effusion appears mildly increased compared to the prior plain films. Much smaller left pleural effusion is unchanged. No pneumothorax. Extensive reticulonodular opacities throughout both lungs and chronic opacity in the right middle lobe again seen. Heart size is normal. Osseous metastatic disease noted. IMPRESSION: Mild increase in right pleural effusion since the most recent plain film. Small left pleural effusion is unchanged. Stable appearance of chronic right middle lobe opacity and diffuse reticulonodular change. Electronically Signed   By: Inge Rise M.D.   On: 02/05/2019 12:48   Ct Chest Wo Contrast  Result Date: 02/06/2019 CLINICAL DATA:  Chest pain. History of metastatic breast cancer.  Status post thoracentesis. EXAM: CT CHEST WITHOUT CONTRAST TECHNIQUE: Multidetector CT imaging of the chest was performed following the standard protocol without IV contrast. COMPARISON:  February 03, 2019. FINDINGS: Cardiovascular: No significant vascular findings. Normal heart size. No pericardial effusion. Mediastinum/Nodes: No enlarged mediastinal or axillary lymph nodes. Thyroid gland, trachea, and esophagus demonstrate no significant findings. Lungs/Pleura: Right pleural effusion noted on prior exam is significantly smaller currently status post thoracentesis. Left pleural effusion is not significantly changed. No pneumothorax is noted. Grossly stable reticular opacities are noted throughout both lungs with focal consolidation seen in the right middle lobe which is slightly decreased compared to prior exam. This may represent lymphangitic spread of malignancy. New focal ground-glass opacity is noted in lingular segment of left upper lobe which may represent inflammation. Upper Abdomen: No acute abnormality. Musculoskeletal: Diffuse sclerotic densities are noted consistent with osseous metastases. IMPRESSION: Right pleural effusion is significantly smaller compared to prior exam, consistent with recent thoracentesis. Left pleural effusion is not significantly changed. Grossly stable reticular densities are noted throughout both lungs with focal consolidation seen in right middle lobe which is slightly improved compared to prior exam, concerning for lymphangitic spread of malignancy. New focal ground-glass opacity is noted in lingular segment of left upper lobe which may represent focal inflammation. Stable diffuse osseous metastases. Electronically Signed   By: Marijo Conception M.D.   On: 02/06/2019 15:42   Dg Chest Port 1 View  Result Date: 02/06/2019 CLINICAL DATA:  Chest pain after thoracentesis. EXAM: PORTABLE CHEST 1 VIEW COMPARISON:  Same day. FINDINGS: Stable mild cardiomegaly. No pneumothorax is  noted. Stable diffuse interstitial densities are noted throughout both lungs. Small pleural effusions are noted. Stable thoracic scoliosis. IMPRESSION: Stable mild diffuse interstitial densities throughout both lungs. Small bilateral pleural effusions are noted. No pneumothorax is noted. Electronically Signed   By: Marijo Conception M.D.   On: 02/06/2019  13:04   US Abdomen Limited Ruq  Result Date: 02/06/2019 CLINICAL DATA:  Right upper quadrant pain EXAM: ULTRASOUND ABDOMEN LIMITED RIGHT UPPER QUADRANT COMPARISON:  None. FINDINGS: Gallbladder: Collapsed gallbladder. No gallstones. Negative sonographic Murphy sign. Gallbladder wall 3.8 mm which is thickened but likely due to collapse gallbladder Common bile duct: Diameter: 3.4 mm. Liver: No focal lesion identified. Within normal limits in parenchymal echogenicity. Portal vein is patent on color Doppler imaging with normal direction of blood flow towards the liver. Other: Small right effusion IMPRESSION: Gallbladder is collapsed. No gallstones are identified. Gallbladder wall appears thickened however this is likely due to incomplete distention. No biliary dilatation or liver abnormality Small right effusion. Electronically Signed   By: Franchot Gallo M.D.   On: 02/06/2019 18:06   US Thoracentesis Asp Pleural Space W/img Guide  Result Date: 02/06/2019 INDICATION: History of breast cancer. Recurrent right pleural effusion. Request for therapeutic thoracentesis. EXAM: ULTRASOUND GUIDED RIGHT THORACENTESIS MEDICATIONS: None. COMPLICATIONS: None immediate. Postprocedural chest x-ray negative for pneumothorax. PROCEDURE: An ultrasound guided thoracentesis was thoroughly discussed with the patient and questions answered. The benefits, risks, alternatives and complications were also discussed. The patient understands and wishes to proceed with the procedure. Written consent was obtained. Ultrasound was performed to localize and mark an adequate pocket of fluid in  the right chest. The area was then prepped and draped in the normal sterile fashion. 1% Lidocaine was used for local anesthesia. Under ultrasound guidance a 6 Fr Safe-T-Centesis catheter was introduced. Thoracentesis was performed. The catheter was removed and a dressing applied. FINDINGS: A total of approximately 500 mL of clear, amber colored fluid was removed. IMPRESSION: Successful ultrasound guided right thoracentesis yielding 500 mL of pleural fluid. Read by: Ascencion Dike PA-C Electronically Signed   By: Markus Daft M.D.   On: 02/06/2019 10:44    Lab Results:  CBC    Component Value Date/Time   WBC 1.4 (L) 02/26/2019 1320   WBC 0.9 (LL) 02/06/2019 0524   RBC 3.54 (L) 02/26/2019 1320   HGB 10.7 (L) 02/26/2019 1320   HCT 30.2 (L) 02/26/2019 1320   PLT 25 (L) 02/26/2019 1320   MCV 85.3 02/26/2019 1320   MCH 30.2 02/26/2019 1320   MCHC 35.4 02/26/2019 1320   RDW 15.5 02/26/2019 1320   LYMPHSABS 1.0 02/26/2019 1320   MONOABS 0.1 02/26/2019 1320   EOSABS 0.0 02/26/2019 1320   BASOSABS 0.0 02/26/2019 1320    BMET    Component Value Date/Time   NA 136 02/26/2019 1320   K 3.7 02/26/2019 1320   CL 100 02/26/2019 1320   CO2 25 02/26/2019 1320   GLUCOSE 216 (H) 02/26/2019 1320   BUN 10 02/26/2019 1320   CREATININE 0.73 02/26/2019 1320   CALCIUM 6.6 (L) 02/26/2019 1320   GFRNONAA >60 02/26/2019 1320   GFRAA >60 02/26/2019 1320    BNP No results found for: BNP  ProBNP No results found for: PROBNP  Specialty Problems      Pulmonary Problems   Community acquired pneumonia   Dyspnea   Pleural effusion    01/13/2019 - thoracentesis - Right - 400 cc cloudy fluid  02/03/2019-thoracentesis-right - 600 mL of clear yellow fluid was removed 02/06/2019-thoracentesis-right - 500 cc clear amber fluid removed 02/20/2019-thoracentesis-right-600 cc clear yellow/amber fluid removed 03/03/2019-thoracentesis-right-800 cc serous fluid           Allergies  Allergen Reactions    Amoxicillin-Pot Clavulanate Other (See Comments)    Big doses cause diarrhea  Erythromycin Rash   Metoclopramide Hcl Other (See Comments)    Restless legs   Sulfonamide Derivatives Rash    Immunization History  Administered Date(s) Administered   Hep A / Hep B 06/17/2018   Influenza-Unspecified 01/10/2010, 02/09/2011, 01/30/2012, 02/03/2013   Tdap 07/22/2011    Past Medical History:  Diagnosis Date   Alopecia, unspecified    Anemia, unspecified    during her pregnancy   Anxiety state, unspecified    Breast cancer ([place]) 2019   Right Breast Cancer   Bulging disc    Depressive disorder, not elsewhere classified    Diverticulosis of colon (without mention of hemorrhage)    CT Scan   Dysrhythmia    PVCs in the past   Esophageal reflux    Esophagitis 1998   Hiatal hernia 1998   History of kidney stones    Hypercholesteremia    Hypertension    Migraine    Opioid abuse, in remission ([place])     Tobacco History: Social History   Tobacco Use  Smoking Status Former Smoker   Packs/day: 0.25   Quit date: 08/25/2016   Years since quitting: 2.5  Smokeless Tobacco Never Used   Counseling given: Yes   Continue to not smoke  Outpatient Encounter Medications as of 03/05/2019  Medication Sig   ALPRAZolam (XANAX) 0.25 MG tablet Take 1 tablet (0.25 mg total) by mouth at bedtime as needed for anxiety.   atorvastatin (LIPITOR) 20 MG tablet Take 20 mg by mouth at bedtime.    Biotin 10000 MCG TABS Take 10,000 mcg by mouth daily.    bumetanide (BUMEX) 1 MG tablet TAKE 1 TABLET(1 MG) BY MOUTH TWICE DAILY (Patient taking differently: Take 1 mg by mouth 2 (two) times daily. )   cetirizine (ZYRTEC) 10 MG tablet Take 10 mg by mouth daily.   chlorpheniramine-HYDROcodone (TUSSIONEX PENNKINETIC ER) 10-8 MG/5ML SUER Take 5 mLs by mouth 2 (two) times daily.   cyclobenzaprine (FLEXERIL) 10 MG tablet Take 10 mg by mouth every 8 (eight) hours as needed for muscle  spasms.   hydrOXYzine (ATARAX/VISTARIL) 10 MG tablet Take 10-30 mg by mouth at bedtime as needed for itching.    hyoscyamine (LEVSIN SL) 0.125 MG SL tablet dissolve 1 tablet under the tongue every 4 hours if needed (Patient taking differently: Take 0.125 mg by mouth every 4 (four) hours as needed for cramping. )   letrozole (FEMARA) 2.5 MG tablet Take 1 tablet (2.5 mg total) by mouth daily.   LORazepam (ATIVAN) 0.5 MG tablet Take 1 tablet (0.5 mg total) by mouth every 8 (eight) hours.   morphine (MS CONTIN) 15 MG 12 hr tablet Take 1 tablet (15 mg total) by mouth every 12 (twelve) hours.   morphine (MSIR) 15 MG tablet Take 1 tablet (15 mg total) by mouth every 6 (six) hours as needed for severe pain.   Multiple Vitamin (MULITIVITAMIN WITH MINERALS) TABS Take 1 tablet by mouth daily.   palbociclib (IBRANCE) 75 MG tablet Take 75 mg by mouth daily. Take for 21 days on, 7 days off, repeat every 28 days.   pantoprazole (PROTONIX) 40 MG tablet Take 1 tablet (40 mg total) by mouth 2 (two) times daily before a meal. (Patient taking differently: Take 40 mg by mouth 2 (two) times daily. )   polyethylene glycol (MIRALAX / GLYCOLAX) 17 g packet Take 17 g by mouth 2 (two) times daily. Decrease to daily if having watery or multiple loose stools daily.   potassium chloride SA (KLOR-CON)  20 MEQ tablet Take 1 tablet (20 mEq total) by mouth daily.   traZODone (DESYREL) 100 MG tablet Take 150 mg by mouth at bedtime.    albuterol (PROVENTIL) (2.5 MG/3ML) 0.083% nebulizer solution Inhale 3 mLs into the lungs every 6 (six) hours as needed for wheezing or shortness of breath (cough).   No facility-administered encounter medications on file as of 03/05/2019.      Review of Systems  Review of Systems  Constitutional: Positive for fatigue. Negative for activity change and fever.  HENT: Negative for sinus pressure, sinus pain and sore throat.   Respiratory: Positive for shortness of breath. Negative for  cough and wheezing.        Feels this has improved since thoracentesis on 03/03/2019  Cardiovascular: Negative for chest pain and palpitations.  Gastrointestinal: Negative for diarrhea, nausea and vomiting.  Musculoskeletal: Negative for arthralgias.  Neurological: Negative for dizziness.  Psychiatric/Behavioral: Negative for sleep disturbance. The patient is not nervous/anxious.      Physical Exam  BP 106/68 (BP Location: Left Arm, Patient Position: Sitting, Cuff Size: Normal)    Pulse 92    Temp (!) 97.4 F (36.3 C) (Temporal)    Ht 5' (1.524 m)    Wt 113 lb 6.4 oz (51.4 kg)    LMP 12/24/2010    SpO2 100% Comment: 3L pulsed   BMI 22.15 kg/m   Wt Readings from Last 5 Encounters:  03/05/19 113 lb 6.4 oz (51.4 kg)  03/03/19 115 lb 3.2 oz (52.3 kg)  02/26/19 114 lb 4.8 oz (51.8 kg)  02/21/19 115 lb (52.2 kg)  02/20/19 116 lb (52.6 kg)    BMI Readings from Last 5 Encounters:  03/05/19 22.15 kg/m  03/03/19 22.50 kg/m  02/26/19 22.32 kg/m  02/21/19 22.46 kg/m  02/20/19 22.65 kg/m     Physical Exam Vitals signs and nursing note reviewed.  Constitutional:      General: She is not in acute distress.    Appearance: Normal appearance. She is normal weight.     Comments: Thin frail adult  HENT:     Head: Normocephalic and atraumatic.     Right Ear: Tympanic membrane, ear canal and external ear normal. There is no impacted cerumen.     Left Ear: Tympanic membrane, ear canal and external ear normal. There is no impacted cerumen.     Nose: Nose normal. No congestion or rhinorrhea.     Mouth/Throat:     Mouth: Mucous membranes are moist.     Pharynx: Oropharynx is clear.  Eyes:     Pupils: Pupils are equal, round, and reactive to light.  Neck:     Musculoskeletal: Normal range of motion.  Cardiovascular:     Rate and Rhythm: Normal rate and regular rhythm.     Pulses: Normal pulses.     Heart sounds: Normal heart sounds. No murmur.  Pulmonary:     Effort: Pulmonary effort  is normal. No respiratory distress.     Breath sounds: No decreased air movement. Examination of the right-lower field reveals decreased breath sounds. Decreased breath sounds (slightly diminished in RLL ) present. No wheezing or rales.  Skin:    General: Skin is warm and dry.     Capillary Refill: Capillary refill takes less than 2 seconds.  Neurological:     General: No focal deficit present.     Mental Status: She is alert and oriented to person, place, and time. Mental status is at baseline.     Gait:  Gait normal.  Psychiatric:        Mood and Affect: Mood normal.        Behavior: Behavior normal.        Thought Content: Thought content normal.        Judgment: Judgment normal.       Assessment & Plan:   Pleural effusion Plan: Complete 03/11/2019 appointment at 2 PM with Dr. Carlis Abbott to have Pleurx catheter placed Complete outpatient Covid testing on 03/08/2019 at 10 AM Close follow-up in office on 03/13/2019 at 215  Abnormal CT scan, lung Plan: Continue follow-up with oncology  Cancer related pain Stable today  Continue follow-up with oncology  Dyspnea Improved today status post fifth thoracentesis on 03/03/2019  Goals of care, counseling/discussion Although patient is apprehensive to receive Pleurx placement.  She is willing to proceed forward with the procedure.  She understands that she will need this for chronic effusion management.  She is hopeful that she will eventually pleurodesis and not need to have the catheter chronically.  Metastatic breast cancer [facility]) Plan: Continue follow-up with oncology    Return in about 2 weeks (around 03/19/2019), or if symptoms worsen or fail to improve, for Follow up with Dr. Tamala Julian.   Lauraine Rinne, NP 03/05/2019   This appointment was 42 minutes long with over 50% of the time in direct face-to-face patient care, assessment, plan of care, and follow-up.

## 2019-03-04 NOTE — Telephone Encounter (Signed)
Spoke with pt, she states she is still in pain from the thoracocentesis and she has had them in the past and they never hurt. Her blood pressure systolic number is 90 but denies feeling dizzy but the number is very low for her. She took her pain medication (MS Contin) just now but hasn't felt any effects as far as easing the pain. The heaviness has been released from her chest but her pain is around her right ribcage and taking a deep breath hurts really bad. She has an appt with brian tomorrow and wants to know if she should wait until then to be seen. I asked pt could she come in today and she stated it was a possibility if Aaron Edelman thought she needed to come today. Aaron Edelman please advise.

## 2019-03-05 ENCOUNTER — Ambulatory Visit (INDEPENDENT_AMBULATORY_CARE_PROVIDER_SITE_OTHER): Payer: 59 | Admitting: Pulmonary Disease

## 2019-03-05 ENCOUNTER — Other Ambulatory Visit: Payer: Self-pay

## 2019-03-05 ENCOUNTER — Encounter: Payer: Self-pay | Admitting: Pulmonary Disease

## 2019-03-05 VITALS — BP 106/68 | HR 92 | Temp 97.4°F | Ht 60.0 in | Wt 113.4 lb

## 2019-03-05 DIAGNOSIS — Z7189 Other specified counseling: Secondary | ICD-10-CM

## 2019-03-05 DIAGNOSIS — R0600 Dyspnea, unspecified: Secondary | ICD-10-CM

## 2019-03-05 DIAGNOSIS — J90 Pleural effusion, not elsewhere classified: Secondary | ICD-10-CM

## 2019-03-05 DIAGNOSIS — C50919 Malignant neoplasm of unspecified site of unspecified female breast: Secondary | ICD-10-CM | POA: Diagnosis not present

## 2019-03-05 DIAGNOSIS — R0609 Other forms of dyspnea: Secondary | ICD-10-CM

## 2019-03-05 DIAGNOSIS — G893 Neoplasm related pain (acute) (chronic): Secondary | ICD-10-CM | POA: Diagnosis not present

## 2019-03-05 DIAGNOSIS — R918 Other nonspecific abnormal finding of lung field: Secondary | ICD-10-CM | POA: Diagnosis not present

## 2019-03-05 LAB — GUARDANT 360

## 2019-03-05 NOTE — Telephone Encounter (Signed)
Call returned to [facility] with respiratory, they only have 12/1 or 12/2 available.   Will route message to Dr. Tamala Julian.

## 2019-03-05 NOTE — Assessment & Plan Note (Signed)
Improved today status post fifth thoracentesis on 03/03/2019

## 2019-03-05 NOTE — Assessment & Plan Note (Signed)
Stable today  Continue follow-up with oncology

## 2019-03-05 NOTE — Telephone Encounter (Signed)
Patient was seen in office today.  Patient scheduled for Pleurx placement to help manage chronic right pleural effusion on 03/11/2019 at 2 PM with Dr. Carlis Abbott.  Patient to receive Covid screen on 03/08/2019 at 10 AM at [facility].  Patient has outpatient follow-up with Dr. Tamala Julian on 03/13/2019 at 2:15 PM in clinic.  Office chart today routed to providers.Wyn Quaker, FNP

## 2019-03-05 NOTE — Assessment & Plan Note (Signed)
Plan: Continue follow-up with oncology 

## 2019-03-05 NOTE — Telephone Encounter (Signed)
Katie from respiratory therapy called to schedule a thoracentesis.  [phone].

## 2019-03-05 NOTE — Assessment & Plan Note (Addendum)
Plan: Complete 03/11/2019 appointment at 2 PM with Dr. Carlis Abbott to have Pleurx catheter placed Complete outpatient Covid testing on 03/08/2019 at 10 AM Close follow-up in office on 03/13/2019 at 215

## 2019-03-05 NOTE — Patient Instructions (Addendum)
You were seen today by Lauraine Rinne, NP  for:   1. Pleural effusion  We have you scheduled for a Pleurx catheter to be placed on 03/11/2019 at 2 PM by Dr. Ernest Mallick  >>> Preop will call you to give additional information on when you need to arrive prior to 03/11/2019 >>>typically you need to arrive by 1245pm prior to procedure, proceed to [facility] Admitting NOT the ED >>> Do not have anything by mouth after 03/10/2019 at 12 AM   Complete Covid screening on 03/08/2019 at 10 AM >>>[facility]  >>>[place]-Right hand lane for pre-procedure covid test   [place], [place], [place] [address]  [place], [facility] and [facility] will be open from 10 a.m. - 3 p.m.     2. Metastatic breast cancer ([place])  Continue to follow-up with oncology Continue medications as managed by oncology    Follow Up:    Return in about 2 weeks (around 03/19/2019), or if symptoms worsen or fail to improve, for Follow up with Dr. Tamala Julian.  Please do your part to reduce the spread of COVID-19:      Reduce your risk of any infection  and COVID19 by using the similar precautions used for avoiding the common cold or flu:   Wash your hands often with soap and warm water for at least 20 seconds.  If soap and water are not readily available, use an alcohol-based hand sanitizer with at least 60% alcohol.   If coughing or sneezing, cover your mouth and nose by coughing or sneezing into the elbow areas of your shirt or coat, into a tissue or into your sleeve (not your hands).  WEAR A MASK when in public   Avoid shaking hands with others and consider head nods or verbal greetings only.  Avoid touching your eyes, nose, or mouth with unwashed hands.   Avoid close contact with people who are sick.  Avoid places or events with large numbers of people in one location, like concerts or sporting events.  If you have some symptoms but not all symptoms,  continue to monitor at home and seek medical attention if your symptoms worsen.  If you are having a medical emergency, call 911.   [place] / e-Visit: [email or web address]         [facility]: [facility]: [id number]                   [facility]: [id number]     It is flu season:   >>> Best ways to protect herself from the flu: Receive the yearly flu vaccine, practice good hand hygiene washing with soap and also using hand sanitizer when available, eat a nutritious meals, get adequate rest, hydrate appropriately   Please contact the office if your symptoms worsen or you have concerns that you are not improving.   Thank you for choosing [facility] for your healthcare, and for allowing [place] to partner with you on your healthcare journey. I am thankful to be able to provide care to you today.   Wyn Quaker FNP-C                 Indwelling Pleural Catheter Home Guide  An indwelling pleural catheter is a thin, flexible tube that is inserted under your skin and into your  chest. The catheter drains excess fluid that collects in the area between the chest wall and the lungs (pleural space).After the catheter is inserted, it can be attached to a bottle that collects fluid. The pleural catheter will allow you to drain fluid from your chest at home on a regular basis (sometimes daily). This will eliminate the need for frequent visits to the hospital or clinic to drain the fluid. The catheter may be removed after the excess fluid problem is resolved, usually after 2-3 months. It is important to follow instructions from your health care provider about how to drain and care for your catheter. What are the risks? Generally, this is a safe procedure. However, problems may occur, including:  Infection.  Skin damage  around the catheter.  Lung damage.  Failure of the chest tube to work properly.  Spreading of cancer cells along the catheter, if you have cancer. Supplies needed:  Vacuum-sealed drainage bottle with attached drainage line.  Sterile dressing.  Sterile alcohol pads.  Sterile gloves.  Valve cap.  Sterile gauze pads, 4  4 inch (10 cm  10 cm).  Tape.  Adhesive dressing.  Sterile foam catheter pad. How to care for your catheter and insertion site  Wash your hands with soap and warm water before and after touching the catheter or insertion site. If soap and water are not available, use hand sanitizer.  Check your bandage (dressing) daily to make sure it is clean and dry.  Keep the skin around the catheter clean and dry.  Check the catheter regularly for any cracks or kinks in the tubing.  Check your catheter insertion site every day for signs of infection. Check for: ? Skin breakdown. ? Redness, swelling, or pain. ? Fluid or blood. ? Warmth. ? Pus or a bad smell. How to drain your catheter You may need to drain your catheter every day, or more or less often as told by your health care provider. Follow instructions from your health care provider about how to drain your catheter. You may also refer to instructions that come with the drainage system. To drain the catheter: 1. Wash your hands with soap and warm water. If soap and water are not available, use hand sanitizer. 2. Carefully remove the dressing from around the catheter. 3. Wash your hands again. 4. Put on the gloves provided. 5. Prepare the vacuum-sealed drainage bottle and drainage line. Close the drainage line of the vacuum-sealed drainage bottle by squeezing the pinch clamp or rolling the wheel of the roller clamp toward the bottle. The vacuum in the bottle will be lost if the line is not closed completely. 6. Remove the access tip cover from the drainage line. Do not touch the end. Set it on a sterile  surface. 7. Remove the catheter valve cap and throw it away. 8. Use an alcohol pad to clean the end of the catheter. 9. Insert the access tip into the catheter valve. Make sure the valve and access tip are securely connected. Listen for a click to confirm that they are connected. 10. Insert the T plunger to break the vacuum seal on the drainage bottle. 11. Open the clamp on the drainage line. 12. Allow the catheter to drain. Keep the catheter and the drainage bottle below the level of your chest. There may be a one-way valve on the end of the tubing that will allow liquid and air to flow out of the catheter without letting air inside. 13. Drain the amount of fluid  as told by your health care provider. It usually takes 5-15 minutes. Do not drain more than 1000 mL of fluid. You may feel a little discomfort while you are draining. If the pain is severe, stop draining and contact your health care provider. 14. After you finish draining the catheter, remove the drainage bottle tubing from the catheter. 15. Use a clean alcohol pad to wipe the catheter tip. 16. Place a clean cap on the end of the catheter. 17. Use an alcohol pad to clean the skin around the catheter. 18. Allow the skin to air-dry. 19. Put the catheter pad on your skin. Curl the catheter into loops and place it on the pad. Do not place the catheter on your skin. 20. Replace the dressing over the catheter. 21. Discard the drainage bottle as instructed by your health care provider. Do not reuse the drainage bottle. How to change your dressing Change your dressing at least once a week, or more often if needed to keep the dressing dry. Be sure to change the dressing whenever it becomes moist. Your health care provider will tell you how often to change your dressing. 1. Wash your hands with soap and warm water. If soap and water are not available, use hand sanitizer. 2. Gently remove the old dressing. Avoid using scissors to remove the  dressing. Sharp objects may damage the catheter. 3. Wash the skin around the insertion site with mild, fragrance-free soap and warm water. Rinse well, then pat the area dry with a clean cloth. 4. Check the skin around the catheter for signs of infection. Check for: ? Skin breakdown. ? Redness, swelling, or pain. ? Fluid or blood. ? Warmth. ? Pus or a bad smell. 5. If your catheter was stitched (sutured) to your skin, look at the suture to make sure it is still anchored in your skin. 6. Do not apply creams, ointments, or alcohol to the area. Let your skin air-dry completely before you apply a new dressing. 7. Curl the catheter into loops and place it on the sterile catheter pad. Do not place the catheter on your skin. 8. If you do not have a pad, use a clean dressing. Slide the dressing under the disk that holds the drainage catheter in place. 9. Use gauze to cover the catheter and the catheter pad. The catheter should rest on the pad or dressing, not on your skin. 10. Tape the dressing to your skin. You may be instructed to use an adhesive dressing covering instead of gauze and tape. 11. Wash your hands with soap and warm water. If soap and water are not available, use hand sanitizer. General recommendations  Always wash your hands with soap and warm water before and after caring for your catheter and drainage bottle. Use a mild, fragrance-free soap. If soap and water are not available, use hand sanitizer.  Always make sure there are no leaks in the catheter or drainage bottle.  Each time you drain the catheter, note the color and amount of fluid.  Do not touch the tip of the catheter or the drainage bottle tubing.  Do not reuse drainage bottles.  Do not take baths, swim, or use a hot tub until your health care provider approves. Ask your health care provider if you may take showers. You may only be allowed to take sponge baths.  Take deep breaths regularly, followed by a cough. Doing  this can help to prevent lung infection. Contact a health care provider if:  You  have any questions about caring for your catheter or drainage bottle.  You still have pain at the catheter insertion site more than 2 days after your procedure.  You have pain while draining your catheter.  Your catheter becomes bent, twisted, or cracked.  The connection between the catheter and the collection bottle becomes loose.  You have any of these around your catheter insertion site or coming from it: ? Skin breakdown. ? Redness, swelling, or pain. ? Fluid or blood. ? Warmth. ? Pus or a bad smell. Get help right away if:  You have a fever or chills.  You have chest pain.  You have dizziness or shortness of breath.  You have severe redness, swelling, or pain at your catheter insertion site.  The catheter comes out.  The catheter is blocked or clogged. Summary  An indwelling pleural catheter is a thin, flexible tube that is inserted under your skin and into your chest. The catheter drains excess fluid that collects in the area between the chest wall and the lungs (pleural space).  It is important to follow instructions from your health care provider about how to drain and care for your catheter.  Do not touch the tip of the catheter or the drainage bottle tubing.  Always wash your hands with soap and water before and after caring for your catheter and drainage bottle. If soap and water are not available, use hand sanitizer. This information is not intended to replace advice given to you by your health care provider. Make sure you discuss any questions you have with your health care provider. Document Released: 07/20/2016 Document Revised: 07/19/2018 Document Reviewed: 07/20/2016 Elsevier Patient Education  2020 [employer].

## 2019-03-05 NOTE — Assessment & Plan Note (Signed)
Although patient is apprehensive to receive Pleurx placement.  She is willing to proceed forward with the procedure.  She understands that she will need this for chronic effusion management.  She is hopeful that she will eventually pleurodesis and not need to have the catheter chronically.

## 2019-03-06 NOTE — Progress Notes (Signed)
Patient Care Team: Aletha Halim., PA-C as PCP - General (Family Medicine) Nicholas Lose, MD as Consulting Physician (Hematology and Oncology) Kyung Rudd, MD as Consulting Physician (Radiation Oncology) Rolm Bookbinder, MD as Consulting Physician (General Surgery)  DIAGNOSIS:    ICD-10-CM   1. Malignant neoplasm of lower-inner quadrant of right breast of female, estrogen receptor positive ([place])  C50.311    Z17.0     SUMMARY OF ONCOLOGIC HISTORY: Oncology History  Malignant neoplasm of lower-inner quadrant of right breast of female, estrogen receptor positive ([place])  08/16/2017 Initial Diagnosis   Right lumpectomy: 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  High risk   08/16/2017 Cancer Staging   Staging form: Breast, AJCC 8th Edition - Pathologic stage from 08/16/2017: Stage IA (pT1c, pN0, cM0, G2, ER+, PR+, HER2-, Oncotype DX score: 31) - Signed by Gardenia Phlegm, NP on 09/12/2018   09/25/2017 - 02/05/2018 Chemotherapy   Adjuvant chemotherapy with dose dense Adriamycin and Cytoxan x4 followed by Taxol weekly x12    03/11/2018 - 04/25/2018 Radiation Therapy   Adjuvant XRT   01/09/2019 PET scan   Pulmonary hypermetabolic and consistent with lymphangitic tumor spread, confluent right middle lobe consolidation, hypermetabolic abdominal, cervical and left axillary lymph nodes, diffuse liver hypermetabolic some nonspecific.  Diffuse bone marrow hypermetabolism worrisome for metastatic disease.  Prior pericardial effusion resolved.   01/16/2019 Relapse/Recurrence   Pleural effusion: Thoracentesis revealed metastatic adenocarcinoma breast primary ER 75%, PR 50%, HER-2 -1+ Bone marrow biopsy positive for metastatic breast cancer     CHIEF  COMPLIANT: Follow-up of metastatic breast cancer  INTERVAL HISTORY: Jasmine Allen is a 53 y.o. 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 She is currently on treatment with letrozole, Ibrance, and Faslodex.She presents to the clinic todayfor follow-up and a toxicity check.  She complains of diarrhea over the past week.  She has also stopped taking oral potassium.  Because of which she is now hypokalemic.  She has had a thoracentesis with 800 mm was removed and there is a plan to do Pleurx catheter placement next week.  She is in a wheelchair today accompanied by her husband.  She has been feeling very puny.  REVIEW OF SYSTEMS:   Constitutional: Generalized weakness patient in wheelchair Eyes: Denies blurriness of vision Ears, nose, mouth, throat, and face: Denies mucositis or sore throat Respiratory: Cough and shortness of breath Cardiovascular: Denies palpitation, chest discomfort Gastrointestinal: Denies nausea, heartburn or change in bowel habits Skin: Denies abnormal skin rashes Lymphatics: Denies new lymphadenopathy or easy bruising Neurological: Denies numbness, tingling or new weaknesses Behavioral/Psych: Mood is stable, no new changes  Extremities: No lower extremity edema Breast: denies any pain or lumps or nodules in either breasts All other systems were reviewed with the patient and are negative.  I have reviewed the past medical history, past surgical history, social history and family history with the patient and they are unchanged from previous note.  ALLERGIES:  is allergic to amoxicillin-pot clavulanate; erythromycin; metoclopramide hcl; and sulfonamide derivatives.  MEDICATIONS:  Current Outpatient Medications  Medication Sig Dispense Refill  . albuterol (PROVENTIL) (2.5 MG/3ML) 0.083% nebulizer solution Inhale 3 mLs into the lungs every 6 (six) hours  as needed for wheezing or shortness of breath  (cough). 360 mL 0  . ALPRAZolam (XANAX) 0.25 MG tablet Take 1 tablet (0.25 mg total) by mouth at bedtime as needed for anxiety. 30 tablet 0  . atorvastatin (LIPITOR) 20 MG tablet Take 20 mg by mouth at bedtime.     . Biotin 10000 MCG TABS Take 10,000 mcg by mouth daily.     . bumetanide (BUMEX) 1 MG tablet TAKE 1 TABLET(1 MG) BY MOUTH TWICE DAILY (Patient taking differently: Take 1 mg by mouth 2 (two) times daily. ) 60 tablet 2  . cetirizine (ZYRTEC) 10 MG tablet Take 10 mg by mouth daily.    . chlorpheniramine-HYDROcodone (TUSSIONEX PENNKINETIC ER) 10-8 MG/5ML SUER Take 5 mLs by mouth 2 (two) times daily. 140 mL 0  . cyclobenzaprine (FLEXERIL) 10 MG tablet Take 10 mg by mouth every 8 (eight) hours as needed for muscle spasms.  1  . diphenoxylate-atropine (LOMOTIL) 2.5-0.025 MG tablet Take 1 tablet by mouth 4 (four) times daily as needed for diarrhea or loose stools. 30 tablet 1  . hydrOXYzine (ATARAX/VISTARIL) 10 MG tablet Take 10-30 mg by mouth at bedtime as needed for itching.   0  . hyoscyamine (LEVSIN SL) 0.125 MG SL tablet dissolve 1 tablet under the tongue every 4 hours if needed (Patient taking differently: Take 0.125 mg by mouth every 4 (four) hours as needed for cramping. ) 30 tablet 0  . letrozole (FEMARA) 2.5 MG tablet Take 1 tablet (2.5 mg total) by mouth daily. 90 tablet 3  . LORazepam (ATIVAN) 0.5 MG tablet Take 1 tablet (0.5 mg total) by mouth every 8 (eight) hours. 30 tablet 3  . morphine (MS CONTIN) 15 MG 12 hr tablet Take 1 tablet (15 mg total) by mouth every 12 (twelve) hours. 60 tablet 0  . morphine (MSIR) 15 MG tablet Take 1 tablet (15 mg total) by mouth every 6 (six) hours as needed for severe pain. 60 tablet 0  . Multiple Vitamin (MULITIVITAMIN WITH MINERALS) TABS Take 1 tablet by mouth daily.    . palbociclib (IBRANCE) 75 MG tablet Take 75 mg by mouth daily. Take for 21 days on, 7 days off, repeat every 28 days.    . pantoprazole (PROTONIX) 40 MG tablet Take 1 tablet (40 mg  total) by mouth 2 (two) times daily before a meal. (Patient taking differently: Take 40 mg by mouth 2 (two) times daily. ) 60 tablet 6  . polyethylene glycol (MIRALAX / GLYCOLAX) 17 g packet Take 17 g by mouth 2 (two) times daily. Decrease to daily if having watery or multiple loose stools daily. 30 each 0  . potassium chloride SA (KLOR-CON) 20 MEQ tablet Take 1 tablet (20 mEq total) by mouth daily. 30 tablet 1  . traZODone (DESYREL) 100 MG tablet Take 150 mg by mouth at bedtime.      No current facility-administered medications for this visit.     PHYSICAL EXAMINATION: ECOG PERFORMANCE STATUS: 1 - Symptomatic but completely ambulatory  Vitals:   03/07/19 1107  BP: 127/83  Pulse: 87  Resp: 17  Temp: 98.5 F (36.9 C)  SpO2: 100%   Filed Weights   03/07/19 1107  Weight: 113 lb (51.3 kg)    GENERAL: alert, no distress and comfortable SKIN: skin color, texture, turgor are normal, no rashes or significant lesions EYES: normal, Conjunctiva are pink and non-injected, sclera clear OROPHARYNX: no exudate, no erythema and lips, buccal mucosa, and tongue normal  NECK: supple, thyroid  normal size, non-tender, without nodularity LYMPH: no palpable lymphadenopathy in the cervical, axillary or inguinal LUNGS: clear to auscultation and percussion with normal breathing effort HEART: regular rate & rhythm and no murmurs and no lower extremity edema ABDOMEN: abdomen soft, non-tender and normal bowel sounds MUSCULOSKELETAL: no cyanosis of digits and no clubbing  NEURO: alert & oriented x 3 with fluent speech, no focal motor/sensory deficits EXTREMITIES: No lower extremity edema  LABORATORY DATA:  I have reviewed the data as listed CMP Latest Ref Rng & Units 03/07/2019 02/26/2019 02/21/2019  Glucose 70 - 99 mg/dL 253(H) 216(H) 209(H)  BUN 6 - 20 mg/dL 7 10 12   Creatinine 0.44 - 1.00 mg/dL 0.64 0.73 0.74  Sodium 135 - 145 mmol/L 133(L) 136 136  Potassium 3.5 - 5.1 mmol/L 3.0(LL) 3.7 4.2   Chloride 98 - 111 mmol/L 100 100 99  CO2 22 - 32 mmol/L 23 25 29   Calcium 8.9 - 10.3 mg/dL 6.7(L) 6.6(L) 8.4(L)  Total Protein 6.5 - 8.1 g/dL 5.4(L) 5.7(L) 5.7(L)  Total Bilirubin 0.3 - 1.2 mg/dL 1.3(H) 0.8 0.9  Alkaline Phos 38 - 126 U/L 292(H) 420(H) 459(H)  AST 15 - 41 U/L 63(H) 76(H) 71(H)  ALT 0 - 44 U/L 38 48(H) 43    Lab Results  Component Value Date   WBC 1.4 (L) 03/07/2019   HGB 9.3 (L) 03/07/2019   HCT 26.2 (L) 03/07/2019   MCV 83.2 03/07/2019   PLT 18 (L) 03/07/2019   NEUTROABS 0.7 (L) 03/07/2019    ASSESSMENT & PLAN:  Malignant neoplasm of lower-inner quadrant of right breast of female, estrogen receptor positive ([facility]) 08/16/2017:Right lumpectomy: Grade 2 IDC 1.5 cm, with DCIS and necrosis, 0/3 lymph nodes negative, ER 95%, PR 30%, HER-2 negative ratio 1.14, Ki-67 40%, T1CN0 stage Ia; resection of the margin 09/11/2017: Benign  01/09/2019: PET CT scan:Pulmonary hypermetabolic and consistent with lymphangitic tumor spread, confluent right middle lobe consolidation, hypermetabolic abdominal, cervical and left axillary lymph nodes, diffuse liver hypermetabolic some nonspecific. Diffuse bone marrow hypermetabolism worrisome for metastatic disease. Prior pericardial effusion resolved.  01/16/2019:Pleural effusion: Thoracentesis revealed metastatic adenocarcinoma breast primary ER 75%, PR 50%, HER-2 -1+ Bone marrow biopsy positive for metastatic breast cancer  [facility] molecular testing: ER/PR positive HER-2 negative, ER positive, TMB low (1), BRCA 1 and 2 -, ESR 1 mutation not detected, PI K3 CA negative PD-L1 by guardant will be obtained -------------------------------------------------------------------------------------------------------------------------------------------------------------- Current treatment: Letrozole 2.5 mg daily started 01/15/2019, Ibrance started 01/24/2019, Faslodex and Xgeva added 02/21/2019 Hospitalization: 02/03/19- 02/07/19 Malignant Pleural effusion  S/P thoracentesis Plan: 1.Faslodex will be added to the treatment 2.ContinueIbrance 75 mg 3.Xgeva q 3 months  Lab review: Potassium 3 Hypokalemia: We will give her IV potassium today.  I instructed her to take potassium daily. Malignant pleural effusion:  Pleurx catheter placement to be done by pulmonary.  I sent a message to pulmonary to see if patient needs platelet transfusion prior to the procedure.   We decided to continue with treatment in spite of low counts. She understands risks of this approach.  Severe anemia: Previously given blood transfusion, monitoring closely. Plan is to obtain CT chest abdomen pelvis in December and follow-up after that.  Return to clinic along with her injection appointment to recheck her labs and follow-up.   No orders of the defined types were placed in this encounter.  The patient has a good understanding of the overall plan. she agrees with it. she will call with any problems that may develop before the next  visit here.  Nicholas Lose, MD 03/07/2019  Julious Oka Dorshimer, am acting as scribe for Dr. Nicholas Lose.  I have reviewed the above documentation for accuracy and completeness, and I agree with the above.

## 2019-03-07 ENCOUNTER — Telehealth: Payer: Self-pay | Admitting: Hematology and Oncology

## 2019-03-07 ENCOUNTER — Inpatient Hospital Stay: Payer: 59

## 2019-03-07 ENCOUNTER — Other Ambulatory Visit: Payer: Self-pay

## 2019-03-07 ENCOUNTER — Inpatient Hospital Stay (HOSPITAL_BASED_OUTPATIENT_CLINIC_OR_DEPARTMENT_OTHER): Payer: 59 | Admitting: Hematology and Oncology

## 2019-03-07 DIAGNOSIS — Z170 Estrogen receptor positive status [ER+]: Secondary | ICD-10-CM

## 2019-03-07 DIAGNOSIS — C50311 Malignant neoplasm of lower-inner quadrant of right female breast: Secondary | ICD-10-CM

## 2019-03-07 DIAGNOSIS — Z95828 Presence of other vascular implants and grafts: Secondary | ICD-10-CM

## 2019-03-07 DIAGNOSIS — Z5111 Encounter for antineoplastic chemotherapy: Secondary | ICD-10-CM | POA: Diagnosis not present

## 2019-03-07 LAB — CBC WITH DIFFERENTIAL (CANCER CENTER ONLY)
Abs Immature Granulocytes: 0.01 10*3/uL (ref 0.00–0.07)
Basophils Absolute: 0 10*3/uL (ref 0.0–0.1)
Basophils Relative: 1 %
Eosinophils Absolute: 0 10*3/uL (ref 0.0–0.5)
Eosinophils Relative: 1 %
HCT: 26.2 % — ABNORMAL LOW (ref 36.0–46.0)
Hemoglobin: 9.3 g/dL — ABNORMAL LOW (ref 12.0–15.0)
Immature Granulocytes: 1 %
Lymphocytes Relative: 42 %
Lymphs Abs: 0.6 10*3/uL — ABNORMAL LOW (ref 0.7–4.0)
MCH: 29.5 pg (ref 26.0–34.0)
MCHC: 35.5 g/dL (ref 30.0–36.0)
MCV: 83.2 fL (ref 80.0–100.0)
Monocytes Absolute: 0.1 10*3/uL (ref 0.1–1.0)
Monocytes Relative: 8 %
Neutro Abs: 0.7 10*3/uL — ABNORMAL LOW (ref 1.7–7.7)
Neutrophils Relative %: 47 %
Platelet Count: 18 10*3/uL — ABNORMAL LOW (ref 150–400)
RBC: 3.15 MIL/uL — ABNORMAL LOW (ref 3.87–5.11)
RDW: 16.5 % — ABNORMAL HIGH (ref 11.5–15.5)
WBC Count: 1.4 10*3/uL — ABNORMAL LOW (ref 4.0–10.5)
nRBC: 0 % (ref 0.0–0.2)

## 2019-03-07 LAB — CMP (CANCER CENTER ONLY)
ALT: 38 U/L (ref 0–44)
AST: 63 U/L — ABNORMAL HIGH (ref 15–41)
Albumin: 2.6 g/dL — ABNORMAL LOW (ref 3.5–5.0)
Alkaline Phosphatase: 292 U/L — ABNORMAL HIGH (ref 38–126)
Anion gap: 10 (ref 5–15)
BUN: 7 mg/dL (ref 6–20)
CO2: 23 mmol/L (ref 22–32)
Calcium: 6.7 mg/dL — ABNORMAL LOW (ref 8.9–10.3)
Chloride: 100 mmol/L (ref 98–111)
Creatinine: 0.64 mg/dL (ref 0.44–1.00)
GFR, Est AFR Am: >60 mL/min (ref >60)
GFR, Estimated: >60 mL/min (ref >60)
Glucose, Bld: 253 mg/dL — ABNORMAL HIGH (ref 70–99)
Potassium: 3 mmol/L — CL (ref 3.5–5.1)
Sodium: 133 mmol/L — ABNORMAL LOW (ref 135–145)
Total Bilirubin: 1.3 mg/dL — ABNORMAL HIGH (ref 0.3–1.2)
Total Protein: 5.4 g/dL — ABNORMAL LOW (ref 6.5–8.1)

## 2019-03-07 LAB — SAMPLE TO BLOOD BANK

## 2019-03-07 MED ORDER — FULVESTRANT 250 MG/5ML IM SOLN
500.0000 mg | Freq: Once | INTRAMUSCULAR | Status: AC
  Administered 2019-03-07: 500 mg via INTRAMUSCULAR

## 2019-03-07 MED ORDER — SODIUM CHLORIDE 0.9 % IV SOLN
INTRAVENOUS | Status: DC
  Administered 2019-03-07: 14:00:00 via INTRAVENOUS
  Filled 2019-03-07 (×2): qty 250

## 2019-03-07 MED ORDER — POTASSIUM CHLORIDE 10 MEQ/100ML IV SOLN
INTRAVENOUS | Status: AC
  Filled 2019-03-07: qty 100

## 2019-03-07 MED ORDER — HYDROCOD POLST-CPM POLST ER 10-8 MG/5ML PO SUER: 5.0000 mL | Freq: Two times a day (BID) | ORAL | 0 refills | Status: DC

## 2019-03-07 MED ORDER — LORAZEPAM 0.5 MG PO TABS: 0.5000 mg | ORAL_TABLET | Freq: Three times a day (TID) | ORAL | 3 refills | Status: DC

## 2019-03-07 MED ORDER — POTASSIUM CHLORIDE 10 MEQ/100ML IV SOLN
10.0000 meq | Freq: Once | INTRAVENOUS | Status: DC
  Administered 2019-03-07: 10 meq via INTRAVENOUS

## 2019-03-07 MED ORDER — FULVESTRANT 250 MG/5ML IM SOLN
INTRAMUSCULAR | Status: AC
  Filled 2019-03-07: qty 10

## 2019-03-07 MED ORDER — DIPHENOXYLATE-ATROPINE 2.5-0.025 MG PO TABS: 1.0000 | ORAL_TABLET | Freq: Four times a day (QID) | ORAL | 1 refills | Status: DC | PRN

## 2019-03-07 MED ORDER — SODIUM CHLORIDE 0.9 % IV SOLN
Freq: Once | INTRAVENOUS | Status: DC
  Filled 2019-03-07: qty 250

## 2019-03-07 NOTE — Telephone Encounter (Signed)
Scheduled per 11/27 los, patient is notified.

## 2019-03-07 NOTE — Assessment & Plan Note (Signed)
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 Diffuse bone marrow hypermetabolism worrisome for metastatic disease. Prior pericardial effusion resolved.  01/16/2019:Pleural effusion: Thoracentesis revealed metastatic adenocarcinoma breast primary ER 75%, PR 50%, HER-2 -1+ Bone marrow biopsy positive for metastatic breast cancer  [facility] molecular testing: ER/PR positive HER-2 negative, ER positive, TMB low (1), BRCA 1 and 2 -, ESR 1 mutation not detected, PI K3 CA negative PD-L1 by guardant will be obtained -------------------------------------------------------------------------------------------------------------------------------------------------------------- Current treatment: Letrozole 2.5 mg daily started 01/15/2019, Ibrance started 01/24/2019, Faslodex and Xgeva added 02/21/2019 Hospitalization: 02/03/19- 02/07/19 Malignant Pleural effusion S/P thoracentesis Plan: 1.Faslodex will be added to the treatment 2.ContinueIbrance 75 mg 3.Xgeva q 3 months  Lab review:   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 She understands risks of this approach.  Severe anemia: Previously given blood transfusion, monitoring closely. Return to clinic along with her injection appointment to recheck her labs and follow-up.  She understands everything about neutropenic precautions.

## 2019-03-07 NOTE — Progress Notes (Signed)
CRITICAL VALUE ALERT  Critical Value:  Potassium 3.0  Date & Time Notied:  03/07/2019 at 1127  Provider Notified: Nicholas Lose, MD at 1130  Orders Received/Actions taken: MD aware

## 2019-03-07 NOTE — Progress Notes (Signed)
Patient was transferred to infusion once medication was released.

## 2019-03-07 NOTE — Patient Instructions (Signed)
Fulvestrant injection What is this medicine? FULVESTRANT (ful VES trant) blocks the effects of estrogen. It is used to treat breast cancer. This medicine may be used for other purposes; ask your health care provider or pharmacist if you have questions. COMMON BRAND NAME(S): FASLODEX What should I tell my health care provider before I take this medicine? They need to know if you have any of these conditions:  bleeding disorders  liver disease  low blood counts, like low white cell, platelet, or red cell counts  an unusual or allergic reaction to fulvestrant, other medicines, foods, dyes, or preservatives  pregnant or trying to get pregnant  breast-feeding How should I use this medicine? This medicine is for injection into a muscle. It is usually given by a health care professional in a hospital or clinic setting. Talk to your pediatrician regarding the use of this medicine in children. Special care may be needed. Overdosage: If you think you have taken too much of this medicine contact a poison control center or emergency room at once. NOTE: This medicine is only for you. Do not share this medicine with others. What if I miss a dose? It is important not to miss your dose. Call your doctor or health care professional if you are unable to keep an appointment. What may interact with this medicine?  medicines that treat or prevent blood clots like warfarin, enoxaparin, dalteparin, apixaban, dabigatran, and rivaroxaban This list may not describe all possible interactions. Give your health care provider a list of all the medicines, herbs, non-prescription drugs, or dietary supplements you use. Also tell them if you smoke, drink alcohol, or use illegal drugs. Some items may interact with your medicine. What should I watch for while using this medicine? Your condition will be monitored carefully while you are receiving this medicine. You will need important blood work done while you are taking  this medicine. Do not become pregnant while taking this medicine or for at least 1 year after stopping it. Women of child-bearing potential will need to have a negative pregnancy test before starting this medicine. Women should inform their doctor if they wish to become pregnant or think they might be pregnant. There is a potential for serious side effects to an unborn child. Men should inform their doctors if they wish to father a child. This medicine may lower sperm counts. Talk to your health care professional or pharmacist for more information. Do not breast-feed an infant while taking this medicine or for 1 year after the last dose. What side effects may I notice from receiving this medicine? Side effects that you should report to your doctor or health care professional as soon as possible:  allergic reactions like skin rash, itching or hives, swelling of the face, lips, or tongue  feeling faint or lightheaded, falls  pain, tingling, numbness, or weakness in the legs  signs and symptoms of infection like fever or chills; cough; flu-like symptoms; sore throat  vaginal bleeding Side effects that usually do not require medical attention (report to your doctor or health care professional if they continue or are bothersome):  aches, pains  constipation  diarrhea  headache  hot flashes  nausea, vomiting  pain at site where injected  stomach pain This list may not describe all possible side effects. Call your doctor for medical advice about side effects. You may report side effects to FDA at 1-800-FDA-1088. Where should I keep my medicine? This drug is given in a hospital or clinic and will   not be stored at home. NOTE: This sheet is a summary. It may not cover all possible information. If you have questions about this medicine, talk to your doctor, pharmacist, or health care provider.  2020 Elsevier/Gold Standard (2017-07-05 11:34:41)    Hypokalemia Hypokalemia means that the  amount of potassium in the blood is lower than normal. Potassium is a chemical (electrolyte) that helps regulate the amount of fluid in the body. It also stimulates muscle tightening (contraction) and helps nerves work properly. Normally, most of the body's potassium is inside cells, and only a very small amount is in the blood. Because the amount in the blood is so small, minor changes to potassium levels in the blood can be life-threatening. What are the causes? This condition may be caused by:  Antibiotic medicine.  Diarrhea or vomiting. Taking too much of a medicine that helps you have a bowel movement (laxative) can cause diarrhea and lead to hypokalemia.  Chronic kidney disease (CKD).  Medicines that help the body get rid of excess fluid (diuretics).  Eating disorders, such as bulimia.  Low magnesium levels in the body.  Sweating a lot. What are the signs or symptoms? Symptoms of this condition include:  Weakness.  Constipation.  Fatigue.  Muscle cramps.  Mental confusion.  Skipped heartbeats or irregular heartbeat (palpitations).  Tingling or numbness. How is this diagnosed? This condition is diagnosed with a blood test. How is this treated? This condition may be treated by:  Taking potassium supplements by mouth.  Adjusting the medicines that you take.  Eating more foods that contain a lot of potassium. If your potassium level is very low, you may need to get potassium through an IV and be monitored in the hospital. Follow these instructions at home:   Take over-the-counter and prescription medicines only as told by your health care provider. This includes vitamins and supplements.  Eat a healthy diet. A healthy diet includes fresh fruits and vegetables, whole grains, healthy fats, and lean proteins.  If instructed, eat more foods that contain a lot of potassium. This includes: ? Nuts, such as peanuts and pistachios. ? Seeds, such as sunflower seeds and  pumpkin seeds. ? Peas, lentils, and lima beans. ? Whole grain and bran cereals and breads. ? Fresh fruits and vegetables, such as apricots, avocado, bananas, cantaloupe, kiwi, oranges, tomatoes, asparagus, and potatoes. ? Orange juice. ? Tomato juice. ? Red meats. ? Yogurt.  Keep all follow-up visits as told by your health care provider. This is important. Contact a health care provider if you:  Have weakness that gets worse.  Feel your heart pounding or racing.  Vomit.  Have diarrhea.  Have diabetes (diabetes mellitus) and you have trouble keeping your blood sugar (glucose) in your target range. Get help right away if you:  Have chest pain.  Have shortness of breath.  Have vomiting or diarrhea that lasts for more than 2 days.  Faint. Summary  Hypokalemia means that the amount of potassium in the blood is lower than normal.  This condition is diagnosed with a blood test.  Hypokalemia may be treated by taking potassium supplements, adjusting the medicines that you take, or eating more foods that are high in potassium.  If your potassium level is very low, you may need to get potassium through an IV and be monitored in the hospital. This information is not intended to replace advice given to you by your health care provider. Make sure you discuss any questions you have with your  health care provider. Document Released: 03/27/2005 Document Revised: 11/07/2017 Document Reviewed: 11/07/2017 Elsevier Patient Education  2020 [employer].

## 2019-03-08 ENCOUNTER — Other Ambulatory Visit (HOSPITAL_COMMUNITY)
Admission: RE | Admit: 2019-03-08 | Discharge: 2019-03-08 | Disposition: A | Discharge status: Home / Self Care | Payer: 59 | Source: Ambulatory Visit | Attending: Internal Medicine | Admitting: Internal Medicine

## 2019-03-08 DIAGNOSIS — Z01812 Encounter for preprocedural laboratory examination: Secondary | ICD-10-CM | POA: Diagnosis present

## 2019-03-08 DIAGNOSIS — Z20828 Contact with and (suspected) exposure to other viral communicable diseases: Secondary | ICD-10-CM | POA: Diagnosis not present

## 2019-03-08 LAB — SARS CORONAVIRUS 2 (TAT 6-24 HRS): SARS Coronavirus 2: NEGATIVE

## 2019-03-10 ENCOUNTER — Telehealth: Payer: Self-pay

## 2019-03-10 ENCOUNTER — Other Ambulatory Visit: Payer: Self-pay | Admitting: *Deleted

## 2019-03-10 DIAGNOSIS — C50311 Malignant neoplasm of lower-inner quadrant of right female breast: Secondary | ICD-10-CM

## 2019-03-10 NOTE — Telephone Encounter (Signed)
Called returned to patient, made aware her platelet infusion is scheduled for 12/01 at 8am. She voiced understanding.   She also wanted to know whether she needs to keep her appt 12/3. I made her aware Dr. Carlis Abbott can make that determination after she see's her tomorrow. Voiced understanding.   Nothing further needed at this time.

## 2019-03-10 NOTE — Telephone Encounter (Signed)
Pt called office this AM, she states she saw Dr. Lindi Adie on Friday. Her platelets are low and she will need a platelet infusion before or the same day of the thoracentesis since they are so low per Dr. Lindi Adie. I made her aware I do no think we would be able to get that arranged as quickly as tomorrow however I would get the message sent. I made her aware this is not something our office normally arranges so I am not sure how long it could take. Voiced understanding.   She also is requesting the results of her covid results. Made aware they are negative.   She also wanted to know whether or not she would be having the chest tube placed on 12/1 or just a thoracentesis. I made her aware I would confirm for sure and get back with her.   Will route message to Georga Kaufmann for recommendations.

## 2019-03-11 ENCOUNTER — Inpatient Hospital Stay: Payer: 59 | Attending: Adult Health

## 2019-03-11 ENCOUNTER — Ambulatory Visit (HOSPITAL_COMMUNITY)
Admission: RE | Admit: 2019-03-11 | Discharge: 2019-03-11 | Disposition: A | Discharge status: Home / Self Care | Payer: 59 | Source: Ambulatory Visit | Attending: Critical Care Medicine | Admitting: Critical Care Medicine

## 2019-03-11 ENCOUNTER — Other Ambulatory Visit: Payer: Self-pay

## 2019-03-11 ENCOUNTER — Ambulatory Visit (HOSPITAL_COMMUNITY)
Admission: RE | Admit: 2019-03-11 | Discharge: 2019-03-11 | Disposition: A | Discharge status: Home / Self Care | Payer: 59 | Source: Ambulatory Visit | Attending: Internal Medicine | Admitting: Internal Medicine

## 2019-03-11 ENCOUNTER — Other Ambulatory Visit: Payer: Self-pay | Admitting: Hematology and Oncology

## 2019-03-11 DIAGNOSIS — Z79811 Long term (current) use of aromatase inhibitors: Secondary | ICD-10-CM | POA: Insufficient documentation

## 2019-03-11 DIAGNOSIS — D649 Anemia, unspecified: Secondary | ICD-10-CM | POA: Diagnosis not present

## 2019-03-11 DIAGNOSIS — Z9221 Personal history of antineoplastic chemotherapy: Secondary | ICD-10-CM | POA: Insufficient documentation

## 2019-03-11 DIAGNOSIS — C7951 Secondary malignant neoplasm of bone: Secondary | ICD-10-CM | POA: Insufficient documentation

## 2019-03-11 DIAGNOSIS — Z803 Family history of malignant neoplasm of breast: Secondary | ICD-10-CM | POA: Insufficient documentation

## 2019-03-11 DIAGNOSIS — C50311 Malignant neoplasm of lower-inner quadrant of right female breast: Secondary | ICD-10-CM | POA: Diagnosis not present

## 2019-03-11 DIAGNOSIS — J9611 Chronic respiratory failure with hypoxia: Secondary | ICD-10-CM

## 2019-03-11 DIAGNOSIS — C7800 Secondary malignant neoplasm of unspecified lung: Secondary | ICD-10-CM | POA: Diagnosis present

## 2019-03-11 DIAGNOSIS — I10 Essential (primary) hypertension: Secondary | ICD-10-CM | POA: Diagnosis not present

## 2019-03-11 DIAGNOSIS — Z923 Personal history of irradiation: Secondary | ICD-10-CM | POA: Insufficient documentation

## 2019-03-11 DIAGNOSIS — E7800 Pure hypercholesterolemia, unspecified: Secondary | ICD-10-CM | POA: Diagnosis not present

## 2019-03-11 DIAGNOSIS — Z882 Allergy status to sulfonamides status: Secondary | ICD-10-CM | POA: Diagnosis not present

## 2019-03-11 DIAGNOSIS — Z880 Allergy status to penicillin: Secondary | ICD-10-CM | POA: Diagnosis not present

## 2019-03-11 DIAGNOSIS — Z87891 Personal history of nicotine dependence: Secondary | ICD-10-CM | POA: Diagnosis not present

## 2019-03-11 DIAGNOSIS — Z938 Other artificial opening status: Secondary | ICD-10-CM

## 2019-03-11 DIAGNOSIS — C50919 Malignant neoplasm of unspecified site of unspecified female breast: Secondary | ICD-10-CM | POA: Diagnosis not present

## 2019-03-11 DIAGNOSIS — C50911 Malignant neoplasm of unspecified site of right female breast: Secondary | ICD-10-CM | POA: Diagnosis not present

## 2019-03-11 DIAGNOSIS — Z79899 Other long term (current) drug therapy: Secondary | ICD-10-CM | POA: Insufficient documentation

## 2019-03-11 DIAGNOSIS — D696 Thrombocytopenia, unspecified: Secondary | ICD-10-CM | POA: Diagnosis not present

## 2019-03-11 DIAGNOSIS — C787 Secondary malignant neoplasm of liver and intrahepatic bile duct: Secondary | ICD-10-CM | POA: Diagnosis not present

## 2019-03-11 DIAGNOSIS — J910 Malignant pleural effusion: Secondary | ICD-10-CM | POA: Diagnosis not present

## 2019-03-11 DIAGNOSIS — Z170 Estrogen receptor positive status [ER+]: Secondary | ICD-10-CM | POA: Insufficient documentation

## 2019-03-11 MED ORDER — MORPHINE SULFATE 15 MG PO TABS: 15.0000 mg | ORAL_TABLET | Freq: Four times a day (QID) | ORAL | 0 refills | Status: DC | PRN

## 2019-03-11 MED ORDER — ACETAMINOPHEN 325 MG PO TABS
650.0000 mg | ORAL_TABLET | Freq: Once | ORAL | Status: AC
  Administered 2019-03-11: 650 mg via ORAL

## 2019-03-11 MED ORDER — SODIUM CHLORIDE 0.9% IV SOLUTION
250.0000 mL | Freq: Once | INTRAVENOUS | Status: DC
  Filled 2019-03-11: qty 250

## 2019-03-11 MED ORDER — DIPHENHYDRAMINE HCL 25 MG PO CAPS
ORAL_CAPSULE | ORAL | Status: AC
  Filled 2019-03-11: qty 1

## 2019-03-11 MED ORDER — MORPHINE SULFATE ER 15 MG PO TBCR: 15.0000 mg | EXTENDED_RELEASE_TABLET | Freq: Two times a day (BID) | ORAL | 0 refills | Status: DC

## 2019-03-11 MED ORDER — DIPHENHYDRAMINE HCL 25 MG PO CAPS
25.0000 mg | ORAL_CAPSULE | Freq: Once | ORAL | Status: AC
  Administered 2019-03-11: 25 mg via ORAL

## 2019-03-11 MED ORDER — ACETAMINOPHEN 325 MG PO TABS
ORAL_TABLET | ORAL | Status: AC
  Filled 2019-03-11: qty 2

## 2019-03-11 MED ORDER — FENTANYL CITRATE (PF) 100 MCG/2ML IJ SOLN
INTRAMUSCULAR | Status: AC
  Filled 2019-03-11: qty 2

## 2019-03-11 MED ORDER — FENTANYL CITRATE (PF) 100 MCG/2ML IJ SOLN
50.0000 ug | INTRAMUSCULAR | Status: AC | PRN
  Administered 2019-03-11 (×2): 50 ug via INTRAVENOUS

## 2019-03-11 NOTE — Procedures (Signed)
PleurX Catheter Instertion Procedure Note  Date of Operation: 03/11/2019  Pre-op Diagnosis: malignant pleural effusion, chronic hypoxic respiratory failure  Post-op Diagnosis: malignant pleural effusion, chronic hypoxic respiratory failure  Surgeon: [name]  Assistants: Marni Griffon, NP  Anesthesia: conscious sedation  Meds Given: fentanyl 130mcg in divided doses  Operation: PleurX catheter insertion on the right  Estimated Blood Loss: [id number]  Complications: none noted  Indications and History: Jasmine Allen is 53 y.o. with a history of malignant pleural effusion.  Recommendation was to perform Pleurx catheter insertion. The risks, benefits, potential complications, treatment options, and expected outcomes were discussed with the patient.  The possibilities of pneumothorax, infection, or damage to surrounding structures were discussed with the patient who freely signed the consent.    Description of Procedure: The patient was seen in the preoperative area, was examined and was deemed appropriate to proceed.  The patient was taken to endoscopy, identified as MOLINA LATIMORE with date of birth 1965-12-19 and MRN [id number], and the procedure verified as Pleurx catheter placement.  A Time Out was held and the above information confirmed.   Conscious sedation was initiated as indicated above.  With the patient in the semirecumbent position, ultrasound was used to locate fluid in the pleural space.  The site was marked and the area thoroughly cleaned with chlorhexidine.  The area was anesthetized with 20cc of 1% lidocaine, including through subcutaneous tissues to the pleura.  Pleural fluid was withdrawn with the finder needle.  The procedure needle was advanced through the same tract into the pleural fluid, where fluid was again withdrawn.  The guidewire was advanced into the pleural space with subsequent removal of the needle.  A few centimeters anteriorly a skin incision was made,  followed by a skin incision by the guidewire.  A Pleurx catheter attached to a soft tissue dissector was advanced through the anterior incision with tunneling through the subcutaneous tissues to the posterior incision.  The Pleurx catheter was placed with the cuff appropriately about 1 cm from the anterior incision.  The soft tissue dissector was separated from a the Pleurx catheter.  Dilator was advanced over the guidewire into the pleural space and withdrawn.  The guidewire and peel-away catheter were advanced over the guidewire into the pleural space, followed by removal of the dilator and guidewire while leaving the peel-away catheter in place.  The Pleurx catheter was then advanced through the peel-away catheter with the catheter removed in its entirety. The pleurX catheter was connected to a sterile pleurX bottle with successful drainage of pleural fluid. The catheter was secured in place with sutures. The posterior incision was closed with 1 suture. Following successful drainage of 850cc of pleural fluid, the bottle was removed from the catheter. A sterile cap was placed over the catheter opening and the sterile dressing was applied.   EBL <10cc Pleural fluid 850cc  Plans:  Home health has been ordered, as well as bottles for home drainage. Follow up in Pulmonary clinic has been arranged on 03/13/2019 with Dr. Tamala Julian.   [name] 03/11/19 3:27 PM [facility]

## 2019-03-11 NOTE — H&P (Signed)
Jasmine Allen is an 53 y.o. female.   Chief Complaint: pleural effusion HPI: Right pleural effusion 2/2 breast cancer, s/p 5 thoracenteses with rapid reaccumulation. Received 2 units of platelets this morning.  Past Medical History:  Diagnosis Date  . Alopecia, unspecified   . Anemia, unspecified    during her pregnancy  . Anxiety state, unspecified   . Breast cancer ([place]) 2019   Right Breast Cancer  . Bulging disc   . Depressive disorder, not elsewhere classified   . Diverticulosis of colon (without mention of hemorrhage)    CT Scan  . Dysrhythmia    PVCs in the past  . Esophageal reflux   . Esophagitis 1998  . Hiatal hernia 1998  . History of kidney stones   . Hypercholesteremia   . Hypertension   . Migraine   . Opioid abuse, in remission [facility])     Past Surgical History:  Procedure Laterality Date  . ABLATION    . BREAST LUMPECTOMY Right 08/16/2017  . BREAST LUMPECTOMY WITH AXILLARY LYMPH NODE BIOPSY Right 08/16/2017   Procedure: RIGHT BREAST LUMPECTOMY WITH AXILLARY SENTINEL LYMPH NODE BIOPSY;  Surgeon: Rolm Bookbinder, MD;  Location: [facility];  Service: General;  Laterality: Right;  . CARDIAC CATHETERIZATION  2011   normal coronaries  . KNEE SURGERY    . PORTACATH PLACEMENT Right 09/11/2017   Procedure: INSERTION PORT-A-CATH WITH ULTRASOUND;  Surgeon: Rolm Bookbinder, MD;  Location: [facility];  Service: General;  Laterality: Right;  . RE-EXCISION OF BREAST LUMPECTOMY Right 09/11/2017   Procedure: RE-EXCISION OF BREAST LUMPECTOMY;  Surgeon: Rolm Bookbinder, MD;  Location: [facility];  Service: General;  Laterality: Right;  . TONSILLECTOMY      Family History  Problem Relation Age of Onset  . Heart disease Father        CABG  . Breast cancer Mother   . Breast cancer Maternal Grandmother   . Breast cancer Cousin   . Colon cancer Neg Hx   . Stomach cancer Neg Hx    Social History:  reports that she quit smoking about 2 years ago.  She smoked 0.25 packs per day. She has never used smokeless tobacco. She reports current alcohol use. She reports that she does not use drugs.  Allergies:  Allergies  Allergen Reactions  . Amoxicillin-Pot Clavulanate Other (See Comments)    Big doses cause diarrhea   . Erythromycin Rash  . Metoclopramide Hcl Other (See Comments)    Restless legs  . Sulfonamide Derivatives Rash    (Not in a hospital admission)   Results for orders placed or performed in visit on 03/10/19 (from the past 48 hour(s))  Prepare Pheresed Platelets     Status: None (Preliminary result)   Collection Time: 03/10/19 10:30 AM  Result Value Ref Range   Unit Number [id number]    Blood Component Type PLTP LR1 PAS    Unit division 00    Status of Unit ISSUED    Transfusion Status OK TO TRANSFUSE    Unit Number [id number]    Blood Component Type PLTP LR1 PAS    Unit division 00    Status of Unit ISSUED    Transfusion Status      OK TO TRANSFUSE Performed at [facility] [address], [place], [place] [address]    CBC    Component Value Date/Time   WBC 1.4 (L) 03/07/2019 1039   WBC 0.9 (LL) 02/06/2019 0524   RBC 3.15 (L)  03/07/2019 1039   HGB 9.3 (L) 03/07/2019 1039   HCT 26.2 (L) 03/07/2019 1039   PLT 18 (L) 03/07/2019 1039   MCV 83.2 03/07/2019 1039   MCH 29.5 03/07/2019 1039   MCHC 35.5 03/07/2019 1039   RDW 16.5 (H) 03/07/2019 1039   LYMPHSABS 0.6 (L) 03/07/2019 1039   MONOABS 0.1 03/07/2019 1039   EOSABS 0.0 03/07/2019 1039   BASOSABS 0.0 03/07/2019 1039    Review of Systems  Respiratory: Positive for shortness of breath.   All other systems reviewed and are negative.   Last menstrual period 12/24/2010. Physical Exam  Constitutional: She appears well-developed. No distress.  HENT:  Head: Normocephalic and atraumatic.  Eyes: No scleral icterus.  Neck: No JVD present.  Cardiovascular: Normal rate and regular rhythm.  No murmur heard. Respiratory: No  stridor.  Tachypneic, reduced right basilar breath sounds. On supplemental O2.  GI: Soft. She exhibits no distension. There is no abdominal tenderness.  Musculoskeletal:        General: No deformity or edema.  Neurological: She is alert. She exhibits normal muscle tone. Coordination normal.  Skin: Skin is warm and dry. No rash noted. She is not diaphoretic.  Psychiatric: She has a normal mood and affect. Her behavior is normal.     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 She agrees to proceed.  -Has follow up on 12/3 in pulmonary clinic with Dr. Tamala Julian. [facility] skilled nursing has previously been ordered.  [name], DO 03/11/2019, 2:08 PM

## 2019-03-11 NOTE — Progress Notes (Signed)
At 1000 attempted to call over to [facility] respiratory to inquire about any instructions I need to relay to patient prior to sending her there. LVM for Katie with no answer 90 minutes later. Patient aware of where to go so she will be discharged here and go to [facility] and ask any questions then.

## 2019-03-11 NOTE — Patient Instructions (Signed)

## 2019-03-11 NOTE — Progress Notes (Signed)
Patient came in today for pleurX drainage catheter placement by Dr Carlis Abbott. Pre procedure vitals were BP- 126/78, RR-18, HR-76, SpO2- 100% on 3L [place]. Consent was obtained & timeout performed. Patient was given 100 mcg of Fentanyl by Marni Griffon NP given in 2 separate 50 mcg doses 5 minutes apart. Patient tolerated procedure well & vitals were stable throughout procedure. Vitals prior to discharge are BP- 143/53, HR-80, RR-20, SpO2-100% on 3L . IV taken out by Marni Griffon NP prior to discharge. Xray performed & verified by Dr Carlis Abbott. OK for discharge at this time.  Ashley Mariner RRT

## 2019-03-11 NOTE — Discharge Summary (Signed)
Physician Discharge Summary  Patient ID: GENESIS AYLSWORTH MRN: [id number] DOB/AGE: 04-21-1965 53 y.o.  Admit date: 03/11/2019 Discharge date: 03/11/2019  Admission Diagnoses: Malignant pleural effusion  Discharge Diagnoses:  Active Problems:   * No active hospital problems. *   Discharged Condition: good  Hospital Course: Admitted for right pleurX catheter placement, which was performed uneventfully.   Consults: None  Significant Diagnostic Studies: CXR confirmed appropriate placement and near-complete drainage of her right hemithorax.   Treatments: none    Disposition: home with husband  No driving today.   Allergies as of 03/11/2019      Reactions   Amoxicillin-pot Clavulanate Other (See Comments)   Big doses cause diarrhea    Erythromycin Rash   Metoclopramide Hcl Other (See Comments)   Restless legs   Sulfonamide Derivatives Rash      Medication List    TAKE these medications   albuterol (2.5 MG/3ML) 0.083% nebulizer solution Commonly known as: PROVENTIL Inhale 3 mLs into the lungs every 6 (six) hours as needed for wheezing or shortness of breath (cough).   ALPRAZolam 0.25 MG tablet Commonly known as: XANAX Take 1 tablet (0.25 mg total) by mouth at bedtime as needed for anxiety.   atorvastatin 20 MG tablet Commonly known as: LIPITOR Take 20 mg by mouth at bedtime.   Biotin 10000 MCG Tabs Take 10,000 mcg by mouth daily.   bumetanide 1 MG tablet Commonly known as: BUMEX TAKE 1 TABLET(1 MG) BY MOUTH TWICE DAILY What changed: See the new instructions.   cetirizine 10 MG tablet Commonly known as: ZYRTEC Take 10 mg by mouth daily.   chlorpheniramine-HYDROcodone 10-8 MG/5ML Suer Commonly known as: Tussionex Pennkinetic ER Take 5 mLs by mouth 2 (two) times daily.   cyclobenzaprine 10 MG tablet Commonly known as: FLEXERIL Take 10 mg by mouth every 8 (eight) hours as needed for muscle spasms.   diphenoxylate-atropine 2.5-0.025 MG tablet Commonly  known as: LOMOTIL Take 1 tablet by mouth 4 (four) times daily as needed for diarrhea or loose stools.   hydrOXYzine 10 MG tablet Commonly known as: ATARAX/VISTARIL Take 10-30 mg by mouth at bedtime as needed for itching.   hyoscyamine 0.125 MG SL tablet Commonly known as: LEVSIN SL dissolve 1 tablet under the tongue every 4 hours if needed What changed: See the new instructions.   letrozole 2.5 MG tablet Commonly known as: FEMARA Take 1 tablet (2.5 mg total) by mouth daily.   LORazepam 0.5 MG tablet Commonly known as: ATIVAN Take 1 tablet (0.5 mg total) by mouth every 8 (eight) hours.   morphine 15 MG 12 hr tablet Commonly known as: MS CONTIN Take 1 tablet (15 mg total) by mouth every 12 (twelve) hours.   morphine 15 MG tablet Commonly known as: MSIR Take 1 tablet (15 mg total) by mouth every 6 (six) hours as needed for severe pain.   multivitamin with minerals Tabs tablet Take 1 tablet by mouth daily.   palbociclib 75 MG tablet Commonly known as: IBRANCE Take 75 mg by mouth daily. Take for 21 days on, 7 days off, repeat every 28 days.   pantoprazole 40 MG tablet Commonly known as: PROTONIX Take 1 tablet (40 mg total) by mouth 2 (two) times daily before a meal. What changed: when to take this   polyethylene glycol 17 g packet Commonly known as: MIRALAX / GLYCOLAX Take 17 g by mouth 2 (two) times daily. Decrease to daily if having watery or multiple loose stools daily.   potassium  chloride SA 20 MEQ tablet Commonly known as: KLOR-CON Take 1 tablet (20 mEq total) by mouth daily.   Senokot 8.6 MG tablet Generic drug: senna Take by mouth.   traZODone 100 MG tablet Commonly known as: DESYREL Take 150 mg by mouth at bedtime.   valACYclovir 1000 MG tablet Commonly known as: VALTREX Take by mouth.        Signed: [name] 03/11/2019, 3:42 PM

## 2019-03-12 LAB — PREPARE PLATELET PHERESIS
Unit division: 0
Unit division: 0

## 2019-03-12 LAB — BPAM PLATELET PHERESIS
Blood Product Expiration Date: 202012020926
Blood Product Expiration Date: 202012032359
ISSUE DATE / TIME: 202012010932
ISSUE DATE / TIME: 202012011034
Unit Type and Rh: 5100
Unit Type and Rh: 7300

## 2019-03-13 ENCOUNTER — Telehealth: Payer: Self-pay | Admitting: Internal Medicine

## 2019-03-13 ENCOUNTER — Ambulatory Visit: Payer: 59 | Admitting: Internal Medicine

## 2019-03-13 DIAGNOSIS — J90 Pleural effusion, not elsewhere classified: Secondary | ICD-10-CM

## 2019-03-13 NOTE — Telephone Encounter (Signed)
Spoke with the pt  She states she was told that someone would be coming out from a home health care company with supplies for her pleurex cath but she has not heard anything at all  No order was ever placed  Will send order now

## 2019-03-13 NOTE — Telephone Encounter (Signed)
Patient states has a fever low grade fever 100.7. Patient phone number is [phone].

## 2019-03-13 NOTE — Telephone Encounter (Signed)
Pt has had procedure done. Will close this encounter.   Nothing further needed at this time.

## 2019-03-14 ENCOUNTER — Other Ambulatory Visit: Payer: Self-pay

## 2019-03-14 ENCOUNTER — Encounter: Payer: Self-pay | Admitting: Internal Medicine

## 2019-03-14 ENCOUNTER — Ambulatory Visit (INDEPENDENT_AMBULATORY_CARE_PROVIDER_SITE_OTHER): Payer: 59 | Admitting: Internal Medicine

## 2019-03-14 VITALS — BP 118/72 | HR 99 | Temp 97.8°F | Ht 60.0 in | Wt 117.8 lb

## 2019-03-14 DIAGNOSIS — C7800 Secondary malignant neoplasm of unspecified lung: Secondary | ICD-10-CM

## 2019-03-14 DIAGNOSIS — C7951 Secondary malignant neoplasm of bone: Secondary | ICD-10-CM | POA: Diagnosis not present

## 2019-03-14 DIAGNOSIS — J910 Malignant pleural effusion: Secondary | ICD-10-CM

## 2019-03-14 DIAGNOSIS — C50919 Malignant neoplasm of unspecified site of unspecified female breast: Secondary | ICD-10-CM | POA: Diagnosis not present

## 2019-03-14 NOTE — Patient Instructions (Signed)
-   Drain every Monday/Wednesday/Friday up to 1 liter or when you start feeling pain, whichever comes first - Call us if your site looks red/inflammed - Sent you email on drainage procedure - Also notify us if you are getting < 100cc on consecutive drainage trials or breathing gets worse - Come back next week with Dr. Valeta Harms to get sutures taken out

## 2019-03-14 NOTE — Progress Notes (Signed)
Pulmonary Office Followup Note  Seen in f/u for malignant effusion  S: 53 year old woman with metastatic breast cancer with mets to bone, lungs, and R pleural space with recurrent malignant effusion.   S/P pleurX 03/11/19 Has not drained since placed Feels as if fluid building back up Ongoing issues with pain control but bearable  O: Today's Vitals   03/14/19 1423  BP: 118/72  Pulse: 99  Temp: 97.8 F (36.6 C)  TempSrc: Temporal  SpO2: 95%  Weight: 117 lb 12.8 oz (53.4 kg)  Height: 5' (1.524 m)   Body mass index is 23.01 kg/m. On 3L  GEN: middle aged woman in NAD HEENT: no thrush, RRR CV: RRR, ext warm PULM: Diminished R base, no accessory muscle use GI: Soft, +BS EXT: No edema NEURO: Moves all 4 ext to command PSYCH: AOx3, excellent insight  SKIN: R pleurX site looks like healing very well, sutures in place  CXR post pleurX shows no complication, diffuse lymphangitic spread of tumor  A: # Recurrent malignant R effusion s/p PleurX 12/1 with entrapment by symptoms # Metastatic breast cancer on letrozole,Ibrance, and faslodex # Pain due to bone and nerve infiltration of tumor # Chemo induced pancytopenia # Exertional hypoxemia due to advanced cancer- continues to use and benefit from home oxygen  350 cc drained in office before she started complaining of discomfort, sterile dressing applied  P: - Drain MWF until starts feeling entrapment/ pain - Bottles to be delivered within 48h - Drainage technique and dressing reviewed with husband, also gave him link to BD instructional video - Patient will follow up on Tuesday to get sutures removed and have site checked one more time then can space visits to monthly  Erskine Emery MD

## 2019-03-16 ENCOUNTER — Telehealth: Payer: Self-pay | Admitting: Emergency Medicine

## 2019-03-16 NOTE — Telephone Encounter (Signed)
  Pt called regarding increased SOB, no desaturations on 3L/min. Last drained Pleurx catheter 12/4 for 350cc in office with DS. She has not drained the fluid yet at home because she has not yet received supplies. Feels like she needs to be drained. No fevers, chills. She apparently is now not going to get supplies until Wednesday.   Please call her Mon morning to try to arrange at least short term supplies locally, possibly thru [employer]. If unable then she may need to come to office 12/7 to get drained.   Baltazar Apo, MD, PhD 03/16/2019, 9:06 AM [facility] [phone] or if no answer [phone]

## 2019-03-17 ENCOUNTER — Ambulatory Visit (INDEPENDENT_AMBULATORY_CARE_PROVIDER_SITE_OTHER): Payer: 59 | Admitting: Critical Care Medicine

## 2019-03-17 ENCOUNTER — Encounter: Payer: Self-pay | Admitting: Critical Care Medicine

## 2019-03-17 ENCOUNTER — Other Ambulatory Visit: Payer: Self-pay

## 2019-03-17 ENCOUNTER — Other Ambulatory Visit: Payer: 59

## 2019-03-17 ENCOUNTER — Other Ambulatory Visit: Payer: Self-pay | Admitting: Hematology and Oncology

## 2019-03-17 VITALS — BP 108/60 | HR 90 | Temp 97.1°F | Ht 60.0 in | Wt 116.8 lb

## 2019-03-17 DIAGNOSIS — C50911 Malignant neoplasm of unspecified site of right female breast: Secondary | ICD-10-CM

## 2019-03-17 DIAGNOSIS — J910 Malignant pleural effusion: Secondary | ICD-10-CM | POA: Diagnosis not present

## 2019-03-17 MED ORDER — ALPRAZOLAM 0.25 MG PO TABS: 0.2500 mg | ORAL_TABLET | Freq: Every evening | ORAL | 3 refills | Status: DC | PRN

## 2019-03-17 NOTE — Telephone Encounter (Signed)
Pt states her supplies have not come in and won't be here until Wed.  Possible drainage from incision.  See Dr. Agustina Caroli note from yesterday.

## 2019-03-17 NOTE — Telephone Encounter (Signed)
Spoke with the pt  She states that she was told that she would be receiving supplies to drain pleurex over the weekend and if not to call for appt  She states that she will not be receiving any supplies until Wednesday now and can tell that she needs to be drained She denies any fevers, chills, body aches Dr Tamala Julian not in the office today  Dr Carlis Abbott placed the cath so I will schedule appt with her in 30 min slot

## 2019-03-17 NOTE — Progress Notes (Signed)
Synopsis: Referred in December 2020 for MPE 2/2 breast cancer by Aletha Halim., PA-C. Patient of Dr. Tamala Julian with PleurX.  Subjective:   PATIENT ID: Jasmine Allen GENDER: female DOB: 1966-03-06, MRN: [id number]  Chief Complaint  Patient presents with  . Follow-up    Needs pleurex drained.     Jasmine Allen presents for evaluation of some drainage around her pleurX and SOB. She last drained on Friday, 12/4. She was supposed to receive her pleurX bottles yesterday, but she received a text notification that [employer] will be unable to deliver these until Wednesday 12/9 now. She is frustrated that she has not had [facility] come out to help with drainage.       Past Medical History:  Diagnosis Date  . Alopecia, unspecified   . Anemia, unspecified    during her pregnancy  . Anxiety state, unspecified   . Breast cancer ([place]) 2019   Right Breast Cancer  . Bulging disc   . Depressive disorder, not elsewhere classified   . Diverticulosis of colon (without mention of hemorrhage)    CT Scan  . Dysrhythmia    PVCs in the past  . Esophageal reflux   . Esophagitis 1998  . Hiatal hernia 1998  . History of kidney stones   . Hypercholesteremia   . Hypertension   . Migraine   . Opioid abuse, in remission [facility])      Family History  Problem Relation Age of Onset  . Heart disease Father        CABG  . Breast cancer Mother   . Breast cancer Maternal Grandmother   . Breast cancer Cousin   . Colon cancer Neg Hx   . Stomach cancer Neg Hx      Past Surgical History:  Procedure Laterality Date  . ABLATION    . BREAST LUMPECTOMY Right 08/16/2017  . BREAST LUMPECTOMY WITH AXILLARY LYMPH NODE BIOPSY Right 08/16/2017   Procedure: RIGHT BREAST LUMPECTOMY WITH AXILLARY SENTINEL LYMPH NODE BIOPSY;  Surgeon: Rolm Bookbinder, MD;  Location: [facility];  Service: General;  Laterality: Right;  . CARDIAC CATHETERIZATION  2011   normal coronaries  . KNEE SURGERY    . PORTACATH PLACEMENT Right 09/11/2017   Procedure: INSERTION PORT-A-CATH WITH ULTRASOUND;  Surgeon: Rolm Bookbinder, MD;  Location: [facility];  Service: General;  Laterality: Right;  . RE-EXCISION OF BREAST LUMPECTOMY Right 09/11/2017   Procedure: RE-EXCISION OF BREAST LUMPECTOMY;  Surgeon: Rolm Bookbinder, MD;  Location: [place];  Service: General;  Laterality: Right;  . TONSILLECTOMY      Social History   Socioeconomic History  . Marital status: Married    Spouse name: Not on file  . Number of children: Not on file  . Years of education: Not on file  . Highest education level: Not on file  Occupational History  . Not on file  Social Needs  . Financial resource strain: Not on file  . Food insecurity    Worry: Not on file    Inability: Not on file  . Transportation needs    Medical: No    Non-medical: No  Tobacco Use  . Smoking status: Former Smoker    Packs/day: 0.25    Quit date: 08/25/2016    Years since quitting: 2.5  . Smokeless tobacco: Never Used  Substance and Sexual Activity  . Alcohol use: Yes    Comment: "once a week"  . Drug use: No  . Sexual activity: Yes  Birth control/protection: None, Post-menopausal  Lifestyle  . Physical activity    Days per week: Not on file    Minutes per session: Not on file  . Stress: Not on file  Relationships  . Social Herbalist on phone: Not on file    Gets together: Not on file    Attends religious service: Not on file    Active member of club or organization: Not on file    Attends meetings of clubs or organizations: Not on file    Relationship status: Not on file  . Intimate partner violence    Fear of current or ex partner: Not on file    Emotionally abused: Not on file    Physically abused: Not on file    Forced sexual activity: Not on file  Other Topics Concern  . Not on file  Social History Narrative  . Not on file     Allergies  Allergen Reactions  . Amoxicillin-Pot Clavulanate Other (See  Comments)    Big doses cause diarrhea   . Erythromycin Rash  . Metoclopramide Hcl Other (See Comments)    Restless legs  . Sulfonamide Derivatives Rash     Immunization History  Administered Date(s) Administered  . Hep A / Hep B 06/17/2018  . Influenza-Unspecified 01/10/2010, 02/09/2011, 01/30/2012, 02/03/2013  . Tdap 07/22/2011    Outpatient Medications Prior to Visit  Medication Sig Dispense Refill  . ALPRAZolam (XANAX) 0.25 MG tablet Take 1 tablet (0.25 mg total) by mouth at bedtime as needed for anxiety. 30 tablet 0  . atorvastatin (LIPITOR) 20 MG tablet Take 20 mg by mouth at bedtime.     . Biotin 10000 MCG TABS Take 10,000 mcg by mouth daily.     . bumetanide (BUMEX) 1 MG tablet TAKE 1 TABLET(1 MG) BY MOUTH TWICE DAILY (Patient taking differently: Take 1 mg by mouth 2 (two) times daily. ) 60 tablet 2  . cetirizine (ZYRTEC) 10 MG tablet Take 10 mg by mouth daily.    . chlorpheniramine-HYDROcodone (TUSSIONEX PENNKINETIC ER) 10-8 MG/5ML SUER Take 5 mLs by mouth 2 (two) times daily. 140 mL 0  . cyclobenzaprine (FLEXERIL) 10 MG tablet Take 10 mg by mouth every 8 (eight) hours as needed for muscle spasms.  1  . diphenoxylate-atropine (LOMOTIL) 2.5-0.025 MG tablet Take 1 tablet by mouth 4 (four) times daily as needed for diarrhea or loose stools. 30 tablet 1  . hydrOXYzine (ATARAX/VISTARIL) 10 MG tablet Take 10-30 mg by mouth at bedtime as needed for itching.   0  . hyoscyamine (LEVSIN SL) 0.125 MG SL tablet dissolve 1 tablet under the tongue every 4 hours if needed (Patient taking differently: Take 0.125 mg by mouth every 4 (four) hours as needed for cramping. ) 30 tablet 0  . letrozole (FEMARA) 2.5 MG tablet Take 1 tablet (2.5 mg total) by mouth daily. 90 tablet 3  . LORazepam (ATIVAN) 0.5 MG tablet Take 1 tablet (0.5 mg total) by mouth every 8 (eight) hours. 30 tablet 3  . morphine (MS CONTIN) 15 MG 12 hr tablet Take 1 tablet (15 mg total) by mouth every 12 (twelve) hours. 60 tablet 0   . morphine (MSIR) 15 MG tablet Take 1 tablet (15 mg total) by mouth every 6 (six) hours as needed for severe pain. 60 tablet 0  . Multiple Vitamin (MULITIVITAMIN WITH MINERALS) TABS Take 1 tablet by mouth daily.    . palbociclib (IBRANCE) 75 MG tablet Take 75 mg by mouth  daily. Take for 21 days on, 7 days off, repeat every 28 days.    . pantoprazole (PROTONIX) 40 MG tablet Take 1 tablet (40 mg total) by mouth 2 (two) times daily before a meal. (Patient taking differently: Take 40 mg by mouth 2 (two) times daily. ) 60 tablet 6  . polyethylene glycol (MIRALAX / GLYCOLAX) 17 g packet Take 17 g by mouth 2 (two) times daily. Decrease to daily if having watery or multiple loose stools daily. 30 each 0  . potassium chloride SA (KLOR-CON) 20 MEQ tablet Take 1 tablet (20 mEq total) by mouth daily. 30 tablet 1  . senna (SENOKOT) 8.6 MG tablet Take by mouth.    . traZODone (DESYREL) 100 MG tablet Take 150 mg by mouth at bedtime.     . valACYclovir (VALTREX) 1000 MG tablet Take by mouth.    Marland Kitchen albuterol (PROVENTIL) (2.5 MG/3ML) 0.083% nebulizer solution Inhale 3 mLs into the lungs every 6 (six) hours as needed for wheezing or shortness of breath (cough). 360 mL 0   No facility-administered medications prior to visit.     Review of Systems  Constitutional: Negative.   Respiratory: Positive for shortness of breath.   Neurological: Positive for weakness.     Objective:   Vitals:   03/17/19 1437  BP: 108/60  Pulse: 90  Temp: (!) 97.1 F (36.2 C)  TempSrc: Temporal  SpO2: 94%  Weight: 116 lb 12.8 oz (53 kg)  Height: 5' (1.524 m)   94% on 3 LPM BMI Readings from Last 3 Encounters:  03/17/19 22.81 kg/m  03/14/19 23.01 kg/m  03/07/19 22.07 kg/m   Wt Readings from Last 3 Encounters:  03/17/19 116 lb 12.8 oz (53 kg)  03/14/19 117 lb 12.8 oz (53.4 kg)  03/07/19 113 lb (51.3 kg)    Physical Exam Vitals signs reviewed.  Constitutional:      General: She is not in acute distress.     Appearance: She is not ill-appearing.  HENT:     Head: Normocephalic and atraumatic.  Eyes:     General: No scleral icterus. Neck:     Musculoskeletal: Normal range of motion and neck supple.  Cardiovascular:     Rate and Rhythm: Normal rate and regular rhythm.  Pulmonary:     Comments: Breathing comfortably on 3L .  Abdominal:     General: There is no distension.     Palpations: Abdomen is soft.  Musculoskeletal:        General: No swelling or deformity.  Skin:    General: Skin is warm and dry.     Findings: No rash.     Comments: No erythema around catheter. No drainage around catheter.  Neurological:     Mental Status: She is alert.  Psychiatric:        Mood and Affect: Mood normal.     Comments: Tearful at times.      Drained 400cc clear yellow fluid today. Minimal reexpansion pain at 300cc, improved with pause in draining.  CBC    Component Value Date/Time   WBC 1.4 (L) 03/07/2019 1039   WBC 0.9 (LL) 02/06/2019 0524   RBC 3.15 (L) 03/07/2019 1039   HGB 9.3 (L) 03/07/2019 1039   HCT 26.2 (L) 03/07/2019 1039   PLT 18 (L) 03/07/2019 1039   MCV 83.2 03/07/2019 1039   MCH 29.5 03/07/2019 1039   MCHC 35.5 03/07/2019 1039   RDW 16.5 (H) 03/07/2019 1039   LYMPHSABS 0.6 (L) 03/07/2019 1039  MONOABS 0.1 03/07/2019 1039   EOSABS 0.0 03/07/2019 1039   BASOSABS 0.0 03/07/2019 1039       Assessment & Plan:     ICD-10-CM   1. 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 -Contacting her [facility] to determine their schedule for home health assessment and training. Bottles previously ordered. -Will have follow up Wednesday for suture removal; may need to be drained again in office on Wednesday if she has not yet received her bottles. I am concerned because she reports they have recently been having issues with packages being at [facility] for weeks without being delivered.  Chronic hypoxic respiratory failure  -con't 3L supplemental O2   RTC on 12/9 for suture removal and site check, possible drainage.   Current Outpatient Medications:  .  ALPRAZolam (XANAX) 0.25 MG tablet, Take 1 tablet (0.25 mg total) by mouth at bedtime as needed for anxiety., Disp: 30 tablet, Rfl: 0 .  atorvastatin (LIPITOR) 20 MG tablet, Take 20 mg by mouth at bedtime. , Disp: , Rfl:  .  Biotin 10000 MCG TABS, Take 10,000 mcg by mouth daily. , Disp: , Rfl:  .  bumetanide (BUMEX) 1 MG tablet, TAKE 1 TABLET(1 MG) BY MOUTH TWICE DAILY (Patient taking differently: Take 1 mg by mouth 2 (two) times daily. ), Disp: 60 tablet, Rfl: 2 .  cetirizine (ZYRTEC) 10 MG tablet, Take 10 mg by mouth daily., Disp: , Rfl:  .  chlorpheniramine-HYDROcodone (TUSSIONEX PENNKINETIC ER) 10-8 MG/5ML SUER, Take 5 mLs by mouth 2 (two) times daily., Disp: 140 mL, Rfl: 0 .  cyclobenzaprine (FLEXERIL) 10 MG tablet, Take 10 mg by mouth every 8 (eight) hours as needed for muscle spasms., Disp: , Rfl: 1 .  diphenoxylate-atropine (LOMOTIL) 2.5-0.025 MG tablet, Take 1 tablet by mouth 4 (four) times daily as needed for diarrhea or loose stools., Disp: 30 tablet, Rfl: 1 .  hydrOXYzine (ATARAX/VISTARIL) 10 MG tablet, Take 10-30 mg by mouth at bedtime as needed for itching. , Disp: , Rfl: 0 .  hyoscyamine (LEVSIN SL) 0.125 MG SL tablet, dissolve 1 tablet under the tongue every 4 hours if needed (Patient taking differently: Take 0.125 mg by mouth every 4 (four) hours as needed for cramping. ), Disp: 30 tablet, Rfl: 0 .  letrozole (FEMARA) 2.5 MG tablet, Take 1 tablet (2.5 mg total) by mouth daily., Disp: 90 tablet, Rfl: 3 .  LORazepam (ATIVAN) 0.5 MG tablet, Take 1 tablet (0.5 mg total) by mouth every 8 (eight) hours., Disp: 30 tablet, Rfl: 3 .  morphine (MS CONTIN) 15 MG 12 hr tablet, Take 1 tablet (15 mg total) by mouth every 12 (twelve) hours., Disp: 60 tablet, Rfl: 0 .  morphine (MSIR) 15 MG tablet, Take 1 tablet (15 mg total) by mouth every 6 (six) hours as needed  for severe pain., Disp: 60 tablet, Rfl: 0 .  Multiple Vitamin (MULITIVITAMIN WITH MINERALS) TABS, Take 1 tablet by mouth daily., Disp: , Rfl:  .  palbociclib (IBRANCE) 75 MG tablet, Take 75 mg by mouth daily. Take for 21 days on, 7 days off, repeat every 28 days., Disp: , Rfl:  .  pantoprazole (PROTONIX) 40 MG tablet, Take 1 tablet (40 mg total) by mouth 2 (two) times daily before a meal. (Patient taking differently: Take 40 mg by mouth 2 (two) times daily. ), Disp: 60 tablet, Rfl: 6 .  polyethylene glycol (MIRALAX / GLYCOLAX) 17 g packet, Take 17  g by mouth 2 (two) times daily. Decrease to daily if having watery or multiple loose stools daily., Disp: 30 each, Rfl: 0 .  potassium chloride SA (KLOR-CON) 20 MEQ tablet, Take 1 tablet (20 mEq total) by mouth daily., Disp: 30 tablet, Rfl: 1 .  senna (SENOKOT) 8.6 MG tablet, Take by mouth., Disp: , Rfl:  .  traZODone (DESYREL) 100 MG tablet, Take 150 mg by mouth at bedtime. , Disp: , Rfl:  .  valACYclovir (VALTREX) 1000 MG tablet, Take by mouth., Disp: , Rfl:  .  albuterol (PROVENTIL) (2.5 MG/3ML) 0.083% nebulizer solution, Inhale 3 mLs into the lungs every 6 (six) hours as needed for wheezing or shortness of breath (cough)., Disp: 360 mL, Rfl: 0   [name], DO [facility] 03/17/2019 3:48 PM

## 2019-03-17 NOTE — Patient Instructions (Addendum)
Thank you for visiting Dr. Carlis Abbott at [facility]. We recommend the following:   Return in about 2 days (around 03/19/2019).    Please do your part to reduce the spread of COVID-19.

## 2019-03-17 NOTE — Addendum Note (Signed)
Addended by: [name] on: 03/17/2019 05:22 PM   Modules accepted: Orders

## 2019-03-18 ENCOUNTER — Telehealth: Payer: Self-pay | Admitting: Critical Care Medicine

## 2019-03-18 NOTE — Telephone Encounter (Signed)
I spoke to Jasmine Allen this morning to let her know that [facility] will come for pleurX training once she has her bottles. Her bottles were delivered yesterday afternoon. HH has been notified by our PCCs that her bottles have been arrived. She has her visit tomorrow for suture removal and she can be drained here if she has not had [facility] come to the house yet (she will bring her bottle tomorrow).  [name], DO 03/18/19 10:39 AM [facility]

## 2019-03-19 ENCOUNTER — Encounter: Payer: Self-pay | Admitting: Pulmonary Disease

## 2019-03-19 ENCOUNTER — Other Ambulatory Visit: Payer: Self-pay

## 2019-03-19 ENCOUNTER — Ambulatory Visit (INDEPENDENT_AMBULATORY_CARE_PROVIDER_SITE_OTHER): Payer: 59 | Admitting: Pulmonary Disease

## 2019-03-19 VITALS — BP 115/70 | HR 92 | Temp 98.1°F | Ht 60.0 in | Wt 117.0 lb

## 2019-03-19 DIAGNOSIS — L03313 Cellulitis of chest wall: Secondary | ICD-10-CM

## 2019-03-19 DIAGNOSIS — J910 Malignant pleural effusion: Secondary | ICD-10-CM | POA: Diagnosis not present

## 2019-03-19 DIAGNOSIS — Z938 Other artificial opening status: Secondary | ICD-10-CM | POA: Diagnosis not present

## 2019-03-19 DIAGNOSIS — C50919 Malignant neoplasm of unspecified site of unspecified female breast: Secondary | ICD-10-CM

## 2019-03-19 DIAGNOSIS — J90 Pleural effusion, not elsewhere classified: Secondary | ICD-10-CM

## 2019-03-19 MED ORDER — CEPHALEXIN 500 MG PO CAPS: 500.0000 mg | ORAL_CAPSULE | Freq: Two times a day (BID) | ORAL | 0 refills | Status: AC

## 2019-03-19 NOTE — Patient Instructions (Addendum)
Thank you for visiting Dr. Valeta Harms at [facility]. Today we recommend the following:  Meds ordered this encounter  Medications  . cephALEXin (KEFLEX) 500 MG capsule    Sig: Take 1 capsule (500 mg total) by mouth 2 (two) times daily for 7 days.    Dispense:  14 capsule    Refill:  0   Return in about 1 week (around 03/26/2019).    Please do your part to reduce the spread of COVID-19.

## 2019-03-19 NOTE — Progress Notes (Signed)
Synopsis: Referred in December 2020 for Pleurx catheter by Aletha Halim., PA-C  Subjective:   PATIENT ID: Jasmine Allen GENDER: female DOB: [birth year], MRN: [id number]  Chief Complaint  Patient presents with   Follow-up    Patient is here to get stitches taken out and pleurex drained    This is a 53 year old female followed in our office for Pleurx catheter placement regarding malignant pleural effusion.  Came on December 7 for Pleurx catheter drainage because of issues with having home health.  She had not received the bottles via [employer].  They were delivered yesterday.  Here today for suture removal and will plans for Pleurx drainage.The catheter was place on Dec 1st.   Patient seen today in the office.  Did have drainage of the catheter yesterday.  She has noticed some soreness at the catheter insertion site.  Examination of this site today in the office does reveal some redness that does track back approximately halfway.   Past Medical History:  Diagnosis Date   Alopecia, unspecified    Anemia, unspecified    during her pregnancy   Anxiety state, unspecified    Breast cancer ([place]) 2019   Right Breast Cancer   Bulging disc    Depressive disorder, not elsewhere classified    Diverticulosis of colon (without mention of hemorrhage)    CT Scan   Dysrhythmia    PVCs in the past   Esophageal reflux    Esophagitis 1998   Hiatal hernia 1998   History of kidney stones    Hypercholesteremia    Hypertension    Migraine    Opioid abuse, in remission ([place])      Family History  Problem Relation Age of Onset   Heart disease Father        CABG   Breast cancer Mother    Breast cancer Maternal Grandmother    Breast cancer Cousin    Colon cancer Neg Hx    Stomach cancer Neg Hx      Past Surgical History:  Procedure Laterality Date   ABLATION     BREAST LUMPECTOMY Right 08/16/2017   BREAST LUMPECTOMY WITH AXILLARY LYMPH NODE BIOPSY Right  08/16/2017   Procedure: RIGHT BREAST LUMPECTOMY WITH AXILLARY SENTINEL LYMPH NODE BIOPSY;  Surgeon: Rolm Bookbinder, MD;  Location: [facility];  Service: General;  Laterality: Right;   CARDIAC CATHETERIZATION  2011   normal coronaries   KNEE SURGERY     PORTACATH PLACEMENT Right 09/11/2017   Procedure: INSERTION PORT-A-CATH WITH ULTRASOUND;  Surgeon: Rolm Bookbinder, MD;  Location: [facility];  Service: General;  Laterality: Right;   RE-EXCISION OF BREAST LUMPECTOMY Right 09/11/2017   Procedure: RE-EXCISION OF BREAST LUMPECTOMY;  Surgeon: Rolm Bookbinder, MD;  Location: [facility] [facility];  Service: General;  Laterality: Right;   TONSILLECTOMY      Social History   Socioeconomic History   Marital status: Married    Spouse name: Not on file   Number of children: Not on file   Years of education: Not on file   Highest education level: Not on file  Occupational History   Not on file  Social Needs   Financial resource strain: Not on file   Food insecurity    Worry: Not on file    Inability: Not on file   Transportation needs    Medical: No    Non-medical: No  Tobacco Use   Smoking status: Former Smoker    Packs/day: 0.25  Quit date: 08/25/2016    Years since quitting: 2.5   Smokeless tobacco: Never Used  Substance and Sexual Activity   Alcohol use: Yes    Comment: "once a week"   Drug use: No   Sexual activity: Yes    Birth control/protection: None, Post-menopausal  Lifestyle   Physical activity    Days per week: Not on file    Minutes per session: Not on file   Stress: Not on file  Relationships   Social connections    Talks on phone: Not on file    Gets together: Not on file    Attends religious service: Not on file    Active member of club or organization: Not on file    Attends meetings of clubs or organizations: Not on file    Relationship status: Not on file   Intimate partner violence    Fear of current or ex  partner: Not on file    Emotionally abused: Not on file    Physically abused: Not on file    Forced sexual activity: Not on file  Other Topics Concern   Not on file  Social History Narrative   Not on file     Allergies  Allergen Reactions   Amoxicillin-Pot Clavulanate Other (See Comments)    Big doses cause diarrhea    Erythromycin Rash   Metoclopramide Hcl Other (See Comments)    Restless legs   Sulfonamide Derivatives Rash     Outpatient Medications Prior to Visit  Medication Sig Dispense Refill   albuterol (PROVENTIL) (2.5 MG/3ML) 0.083% nebulizer solution Inhale 3 mLs into the lungs every 6 (six) hours as needed for wheezing or shortness of breath (cough). 360 mL 0   ALPRAZolam (XANAX) 0.25 MG tablet Take 1 tablet (0.25 mg total) by mouth at bedtime as needed for anxiety. 30 tablet 3   atorvastatin (LIPITOR) 20 MG tablet Take 20 mg by mouth at bedtime.      Biotin 10000 MCG TABS Take 10,000 mcg by mouth daily.      bumetanide (BUMEX) 1 MG tablet TAKE 1 TABLET(1 MG) BY MOUTH TWICE DAILY (Patient taking differently: Take 1 mg by mouth 2 (two) times daily. ) 60 tablet 2   cetirizine (ZYRTEC) 10 MG tablet Take 10 mg by mouth daily.     chlorpheniramine-HYDROcodone (TUSSIONEX PENNKINETIC ER) 10-8 MG/5ML SUER Take 5 mLs by mouth 2 (two) times daily. 140 mL 0   cyclobenzaprine (FLEXERIL) 10 MG tablet Take 10 mg by mouth every 8 (eight) hours as needed for muscle spasms.  1   diphenoxylate-atropine (LOMOTIL) 2.5-0.025 MG tablet Take 1 tablet by mouth 4 (four) times daily as needed for diarrhea or loose stools. 30 tablet 1   hydrOXYzine (ATARAX/VISTARIL) 10 MG tablet Take 10-30 mg by mouth at bedtime as needed for itching.   0   hyoscyamine (LEVSIN SL) 0.125 MG SL tablet dissolve 1 tablet under the tongue every 4 hours if needed (Patient taking differently: Take 0.125 mg by mouth every 4 (four) hours as needed for cramping. ) 30 tablet 0   letrozole (FEMARA) 2.5 MG  tablet Take 1 tablet (2.5 mg total) by mouth daily. 90 tablet 3   LORazepam (ATIVAN) 0.5 MG tablet Take 1 tablet (0.5 mg total) by mouth every 8 (eight) hours. 30 tablet 3   morphine (MS CONTIN) 15 MG 12 hr tablet Take 1 tablet (15 mg total) by mouth every 12 (twelve) hours. 60 tablet 0   morphine (MSIR) 15 MG  tablet Take 1 tablet (15 mg total) by mouth every 6 (six) hours as needed for severe pain. 60 tablet 0   Multiple Vitamin (MULITIVITAMIN WITH MINERALS) TABS Take 1 tablet by mouth daily.     palbociclib (IBRANCE) 75 MG tablet Take 75 mg by mouth daily. Take for 21 days on, 7 days off, repeat every 28 days.     pantoprazole (PROTONIX) 40 MG tablet Take 1 tablet (40 mg total) by mouth 2 (two) times daily before a meal. (Patient taking differently: Take 40 mg by mouth 2 (two) times daily. ) 60 tablet 6   polyethylene glycol (MIRALAX / GLYCOLAX) 17 g packet Take 17 g by mouth 2 (two) times daily. Decrease to daily if having watery or multiple loose stools daily. 30 each 0   potassium chloride SA (KLOR-CON) 20 MEQ tablet Take 1 tablet (20 mEq total) by mouth daily. 30 tablet 1   senna (SENOKOT) 8.6 MG tablet Take by mouth.     traZODone (DESYREL) 100 MG tablet Take 150 mg by mouth at bedtime.      valACYclovir (VALTREX) 1000 MG tablet Take by mouth.     No facility-administered medications prior to visit.     Review of Systems  Constitutional: Negative for chills, fever, malaise/fatigue and weight loss.  HENT: Negative for hearing loss, sore throat and tinnitus.   Eyes: Negative for blurred vision and double vision.  Respiratory: Positive for cough and shortness of breath. Negative for hemoptysis, sputum production, wheezing and stridor.   Cardiovascular: Negative for chest pain, palpitations, orthopnea, leg swelling and PND.  Gastrointestinal: Negative for abdominal pain, constipation, diarrhea, heartburn, nausea and vomiting.  Genitourinary: Negative for dysuria, hematuria and  urgency.  Musculoskeletal: Negative for joint pain and myalgias.  Skin: Negative for itching and rash.       Redness at catheter insertion site  Neurological: Negative for dizziness, tingling, weakness and headaches.  Endo/Heme/Allergies: Negative for environmental allergies. Does not bruise/bleed easily.  Psychiatric/Behavioral: Negative for depression. The patient is not nervous/anxious and does not have insomnia.   All other systems reviewed and are negative.    Objective:  Physical Exam Vitals signs reviewed.  Constitutional:      General: She is not in acute distress.    Appearance: She is well-developed.  HENT:     Head: Normocephalic and atraumatic.  Eyes:     General: No scleral icterus.    Conjunctiva/sclera: Conjunctivae normal.     Pupils: Pupils are equal, round, and reactive to light.  Neck:     Musculoskeletal: Neck supple.     Vascular: No JVD.     Trachea: No tracheal deviation.  Cardiovascular:     Rate and Rhythm: Normal rate and regular rhythm.     Heart sounds: Normal heart sounds. No murmur.  Pulmonary:     Effort: Pulmonary effort is normal. No tachypnea, accessory muscle usage or respiratory distress.     Breath sounds: No wheezing.     Comments: Right chest cavity with indwelling pleural catheter.  Some redness at insertion site.  Sutures removed. Abdominal:     General: Bowel sounds are normal. There is no distension.     Palpations: Abdomen is soft.     Tenderness: There is no abdominal tenderness.  Musculoskeletal:        General: No tenderness.  Lymphadenopathy:     Cervical: No cervical adenopathy.  Skin:    General: Skin is warm and dry.     Capillary Refill:  Capillary refill takes less than 2 seconds.     Findings: No rash.  Neurological:     Mental Status: She is alert and oriented to person, place, and time.  Psychiatric:        Behavior: Behavior normal.    Chest Wall:    Vitals:   03/19/19 1123  BP: 115/70  Pulse: 92    Temp: 98.1 F (36.7 C)  TempSrc: Temporal  SpO2: 90%  Weight: 117 lb (53.1 kg)  Height: 5' (1.524 m)   90% on RA BMI Readings from Last 3 Encounters:  03/19/19 22.85 kg/m  03/17/19 22.81 kg/m  03/14/19 23.01 kg/m   Wt Readings from Last 3 Encounters:  03/19/19 117 lb (53.1 kg)  03/17/19 116 lb 12.8 oz (53 kg)  03/14/19 117 lb 12.8 oz (53.4 kg)     CBC    Component Value Date/Time   WBC 1.4 (L) 03/07/2019 1039   WBC 0.9 (LL) 02/06/2019 0524   RBC 3.15 (L) 03/07/2019 1039   HGB 9.3 (L) 03/07/2019 1039   HCT 26.2 (L) 03/07/2019 1039   PLT 18 (L) 03/07/2019 1039   MCV 83.2 03/07/2019 1039   MCH 29.5 03/07/2019 1039   MCHC 35.5 03/07/2019 1039   RDW 16.5 (H) 03/07/2019 1039   LYMPHSABS 0.6 (L) 03/07/2019 1039   MONOABS 0.1 03/07/2019 1039   EOSABS 0.0 03/07/2019 1039   BASOSABS 0.0 03/07/2019 1039      Chest Imaging: CXR: good catheter position at time of placement  The patient's images have been independently reviewed by me.    Pulmonary Functions Testing Results: No flowsheet data found.  FeNO: none   Pathology: metastatic breast ca on pleural fluid   Echocardiogram: none  Heart Catheterization: none     Assessment & Plan:     ICD-10-CM   1. Malignant pleural effusion  J91.0   2. Pleural effusion  J90   3. Metastatic breast cancer ([place])  C50.919   4. S/P chest tube placement ([place])  Z93.8   5. 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  Patient has possible chest wall cellulitis at insertion site  Plan:  Today in the office for planned procedure for drainage.  Procedure note: Using sterile technique and prepackaged Pleurx catheter bottles.  As well as suture removal kit.  Sutures were clipped and removed from previous insertion site around tube.  Insertion site examined with areas of erythema  at the insertion site tracking back approximately 1 cm.  Please refer image above.  Drainage catheter was connected after cleaning with alcohol wipe.  Approximately 250 cc of yellow fluid was drained from the pleural space into Vacutainer.  Patient tolerated procedure well with developing mild chest discomfort for a few moments at the conclusion of the procedure.  Pain dissipated.  There was no blood loss.  As per the patient's possible chest wall cellulitis at insertion site we will start Keflex 500 mg twice daily for 7 days.  Follow-up in our office in 1 week to look at wound.  Greater than 50% of this patient's 50-minute office was in face-to-face discussing the recommendations treatment plan.    Current Outpatient Medications:    albuterol (PROVENTIL) (2.5 MG/3ML) 0.083% nebulizer solution, Inhale 3 mLs into the lungs every 6 (six) hours as needed for wheezing or shortness of breath (cough)., Disp: 360 mL, Rfl: 0  ALPRAZolam (XANAX) 0.25 MG tablet, Take 1 tablet (0.25 mg total) by mouth at bedtime as needed for anxiety., Disp: 30 tablet, Rfl: 3   atorvastatin (LIPITOR) 20 MG tablet, Take 20 mg by mouth at bedtime. , Disp: , Rfl:    Biotin 10000 MCG TABS, Take 10,000 mcg by mouth daily. , Disp: , Rfl:    bumetanide (BUMEX) 1 MG tablet, TAKE 1 TABLET(1 MG) BY MOUTH TWICE DAILY (Patient taking differently: Take 1 mg by mouth 2 (two) times daily. ), Disp: 60 tablet, Rfl: 2   cetirizine (ZYRTEC) 10 MG tablet, Take 10 mg by mouth daily., Disp: , Rfl:    chlorpheniramine-HYDROcodone (TUSSIONEX PENNKINETIC ER) 10-8 MG/5ML SUER, Take 5 mLs by mouth 2 (two) times daily., Disp: 140 mL, Rfl: 0   cyclobenzaprine (FLEXERIL) 10 MG tablet, Take 10 mg by mouth every 8 (eight) hours as needed for muscle spasms., Disp: , Rfl: 1   diphenoxylate-atropine (LOMOTIL) 2.5-0.025 MG tablet, Take 1 tablet by mouth 4 (four) times daily as needed for diarrhea or loose stools., Disp: 30 tablet, Rfl: 1    hydrOXYzine (ATARAX/VISTARIL) 10 MG tablet, Take 10-30 mg by mouth at bedtime as needed for itching. , Disp: , Rfl: 0   hyoscyamine (LEVSIN SL) 0.125 MG SL tablet, dissolve 1 tablet under the tongue every 4 hours if needed (Patient taking differently: Take 0.125 mg by mouth every 4 (four) hours as needed for cramping. ), Disp: 30 tablet, Rfl: 0   letrozole (FEMARA) 2.5 MG tablet, Take 1 tablet (2.5 mg total) by mouth daily., Disp: 90 tablet, Rfl: 3   LORazepam (ATIVAN) 0.5 MG tablet, Take 1 tablet (0.5 mg total) by mouth every 8 (eight) hours., Disp: 30 tablet, Rfl: 3   morphine (MS CONTIN) 15 MG 12 hr tablet, Take 1 tablet (15 mg total) by mouth every 12 (twelve) hours., Disp: 60 tablet, Rfl: 0   morphine (MSIR) 15 MG tablet, Take 1 tablet (15 mg total) by mouth every 6 (six) hours as needed for severe pain., Disp: 60 tablet, Rfl: 0   Multiple Vitamin (MULITIVITAMIN WITH MINERALS) TABS, Take 1 tablet by mouth daily., Disp: , Rfl:    palbociclib (IBRANCE) 75 MG tablet, Take 75 mg by mouth daily. Take for 21 days on, 7 days off, repeat every 28 days., Disp: , Rfl:    pantoprazole (PROTONIX) 40 MG tablet, Take 1 tablet (40 mg total) by mouth 2 (two) times daily before a meal. (Patient taking differently: Take 40 mg by mouth 2 (two) times daily. ), Disp: 60 tablet, Rfl: 6   polyethylene glycol (MIRALAX / GLYCOLAX) 17 g packet, Take 17 g by mouth 2 (two) times daily. Decrease to daily if having watery or multiple loose stools daily., Disp: 30 each, Rfl: 0   potassium chloride SA (KLOR-CON) 20 MEQ tablet, Take 1 tablet (20 mEq total) by mouth daily., Disp: 30 tablet, Rfl: 1   senna (SENOKOT) 8.6 MG tablet, Take by mouth., Disp: , Rfl:    traZODone (DESYREL) 100 MG tablet, Take 150 mg by mouth at bedtime. , Disp: , Rfl:    valACYclovir (VALTREX) 1000 MG tablet, Take by mouth., Disp: , Rfl:    Garner Nash, DO [facility] 03/19/2019 11:54 AM

## 2019-03-20 ENCOUNTER — Telehealth: Payer: Self-pay | Admitting: Pulmonary Disease

## 2019-03-20 NOTE — Progress Notes (Signed)
Patient Care Team: Aletha Halim., PA-C as PCP - General (Family Medicine) Nicholas Lose, MD as Consulting Physician (Hematology and Oncology) Kyung Rudd, MD as Consulting Physician (Radiation Oncology) Rolm Bookbinder, MD as Consulting Physician (General Surgery)  DIAGNOSIS:    ICD-10-CM   1. Malignant neoplasm of lower-inner quadrant of right breast of female, estrogen receptor positive ([place])  C50.311    Z17.0     SUMMARY OF ONCOLOGIC HISTORY: Oncology History  Malignant neoplasm of lower-inner quadrant of right breast of female, estrogen receptor positive ([place])  08/16/2017 Initial Diagnosis   Right lumpectomy: 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  High risk   08/16/2017 Cancer Staging   Staging form: Breast, AJCC 8th Edition - Pathologic stage from 08/16/2017: Stage IA (pT1c, pN0, cM0, G2, ER+, PR+, HER2-, Oncotype DX score: 31) - Signed by Gardenia Phlegm, NP on 09/12/2018   09/25/2017 - 02/05/2018 Chemotherapy   Adjuvant chemotherapy with dose dense Adriamycin and Cytoxan x4 followed by Taxol weekly x12    03/11/2018 - 04/25/2018 Radiation Therapy   Adjuvant XRT   01/09/2019 PET scan   Pulmonary hypermetabolic and consistent with lymphangitic tumor spread, confluent right middle lobe consolidation, hypermetabolic abdominal, cervical and left axillary lymph nodes, diffuse liver hypermetabolic some nonspecific.  Diffuse bone marrow hypermetabolism worrisome for metastatic disease.  Prior pericardial effusion resolved.   01/16/2019 Relapse/Recurrence   Pleural effusion: Thoracentesis revealed metastatic adenocarcinoma breast primary ER 75%, PR 50%, HER-2 -1+ Bone marrow biopsy positive for metastatic breast cancer     CHIEF  COMPLIANT: Follow-up of metastatic breast cancer  INTERVAL HISTORY: Jasmine Allen is a 53 y.o. 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 She is currently on treatment with letrozole,Ibrance, and Faslodex.She underwent a right pluerX catheter placement on 03/11/19. She presents to the clinic todayfor follow-up and a toxicity check.  She is feeling severe fatigue and generalized tiredness.  Her husband has noticed that she has been slightly forgetful and can lose her train of thought.  Pain is under very good control on long-acting pain medication.  She is having continued shortness of breath even with minimal exertion.  She is draining her Pleurx catheter on a daily basis.  She is getting about 200 cc.  She is planning to do it every other day from now on.  REVIEW OF SYSTEMS:   Constitutional: Denies fevers, chills or abnormal weight loss Eyes: Denies blurriness of vision Ears, nose, mouth, throat, and face: Denies mucositis or sore throat Respiratory: Denies cough, dyspnea or wheezes Cardiovascular: Denies palpitation, chest discomfort Gastrointestinal: Denies nausea, heartburn or change in bowel habits Skin: Denies abnormal skin rashes Lymphatics: Denies new lymphadenopathy or easy bruising Neurological: Denies numbness, tingling or new weaknesses Behavioral/Psych: Mood is stable, no new changes  Extremities: No lower extremity edema Breast: denies any pain or lumps or nodules in either breasts All other systems were reviewed with the patient and are negative.  I have reviewed the past medical history, past surgical history, social history and family history with the patient and they are unchanged from previous note.  ALLERGIES:  is allergic to amoxicillin-pot clavulanate; erythromycin; metoclopramide hcl; and sulfonamide derivatives.  MEDICATIONS:  Current Outpatient  Medications  Medication Sig Dispense Refill  .  albuterol (PROVENTIL) (2.5 MG/3ML) 0.083% nebulizer solution Inhale 3 mLs into the lungs every 6 (six) hours as needed for wheezing or shortness of breath (cough). 360 mL 0  . ALPRAZolam (XANAX) 0.25 MG tablet Take 1 tablet (0.25 mg total) by mouth at bedtime as needed for anxiety. 30 tablet 3  . atorvastatin (LIPITOR) 20 MG tablet Take 20 mg by mouth at bedtime.     . Biotin 10000 MCG TABS Take 10,000 mcg by mouth daily.     . bumetanide (BUMEX) 1 MG tablet TAKE 1 TABLET(1 MG) BY MOUTH TWICE DAILY (Patient taking differently: Take 1 mg by mouth 2 (two) times daily. ) 60 tablet 2  . cephALEXin (KEFLEX) 500 MG capsule Take 1 capsule (500 mg total) by mouth 2 (two) times daily for 7 days. 14 capsule 0  . cetirizine (ZYRTEC) 10 MG tablet Take 10 mg by mouth daily.    . chlorpheniramine-HYDROcodone (TUSSIONEX PENNKINETIC ER) 10-8 MG/5ML SUER Take 5 mLs by mouth 2 (two) times daily. 140 mL 0  . cyclobenzaprine (FLEXERIL) 10 MG tablet Take 10 mg by mouth every 8 (eight) hours as needed for muscle spasms.  1  . diphenoxylate-atropine (LOMOTIL) 2.5-0.025 MG tablet Take 1 tablet by mouth 4 (four) times daily as needed for diarrhea or loose stools. 30 tablet 1  . hydrOXYzine (ATARAX/VISTARIL) 10 MG tablet Take 10-30 mg by mouth at bedtime as needed for itching.   0  . hyoscyamine (LEVSIN SL) 0.125 MG SL tablet dissolve 1 tablet under the tongue every 4 hours if needed (Patient taking differently: Take 0.125 mg by mouth every 4 (four) hours as needed for cramping. ) 30 tablet 0  . letrozole (FEMARA) 2.5 MG tablet Take 1 tablet (2.5 mg total) by mouth daily. 90 tablet 3  . LORazepam (ATIVAN) 0.5 MG tablet Take 1 tablet (0.5 mg total) by mouth every 8 (eight) hours. 30 tablet 3  . morphine (MS CONTIN) 15 MG 12 hr tablet Take 1 tablet (15 mg total) by mouth every 12 (twelve) hours. 60 tablet 0  . morphine (MSIR) 15 MG tablet Take 1 tablet (15 mg total) by mouth every 6 (six) hours as needed for severe pain. 60  tablet 0  . Multiple Vitamin (MULITIVITAMIN WITH MINERALS) TABS Take 1 tablet by mouth daily.    . palbociclib (IBRANCE) 75 MG tablet Take 75 mg by mouth daily. Take for 21 days on, 7 days off, repeat every 28 days.    . pantoprazole (PROTONIX) 40 MG tablet Take 1 tablet (40 mg total) by mouth 2 (two) times daily before a meal. (Patient taking differently: Take 40 mg by mouth 2 (two) times daily. ) 60 tablet 6  . polyethylene glycol (MIRALAX / GLYCOLAX) 17 g packet Take 17 g by mouth 2 (two) times daily. Decrease to daily if having watery or multiple loose stools daily. 30 each 0  . potassium chloride SA (KLOR-CON) 20 MEQ tablet Take 1 tablet (20 mEq total) by mouth daily. 30 tablet 1  . senna (SENOKOT) 8.6 MG tablet Take by mouth.    . traZODone (DESYREL) 100 MG tablet Take 150 mg by mouth at bedtime.     . valACYclovir (VALTREX) 1000 MG tablet Take by mouth.     No current facility-administered medications for this visit.    PHYSICAL EXAMINATION: ECOG PERFORMANCE STATUS: 1 - Symptomatic but completely ambulatory  Vitals:   03/21/19 1127  BP: 106/69  Pulse:  87  Resp: 18  Temp: 98.3 F (36.8 C)  SpO2: 100%   Filed Weights   03/21/19 1127  Weight: 114 lb 3.2 oz (51.8 kg)    GENERAL: alert, no distress and comfortable SKIN: skin color, texture, turgor are normal, no rashes or significant lesions EYES: normal, Conjunctiva are pink and non-injected, sclera clear OROPHARYNX: no exudate, no erythema and lips, buccal mucosa, and tongue normal  NECK: supple, thyroid normal size, non-tender, without nodularity LYMPH: no palpable lymphadenopathy in the cervical, axillary or inguinal LUNGS: Pleurx catheter right side HEART: regular rate & rhythm and no murmurs and no lower extremity edema ABDOMEN: abdomen soft, non-tender and normal bowel sounds MUSCULOSKELETAL: no cyanosis of digits and no clubbing  NEURO: alert & oriented x 3 with fluent speech, no focal motor/sensory  deficits EXTREMITIES: No lower extremity edema  LABORATORY DATA:  I have reviewed the data as listed CMP Latest Ref Rng & Units 03/21/2019 03/07/2019 02/26/2019  Glucose 70 - 99 mg/dL 300(H) 253(H) 216(H)  BUN 6 - 20 mg/dL _0 Creatinine 0.44 - 1.00 mg/dL 0.66 0.64 0.73  Sodium 135 - 145 mmol/L 136 133(L) 136  Potassium 3.5 - 5.1 mmol/L 3.4(L) 3.0(LL) 3.7  Chloride 98 - 111 mmol/L 101 100 100  CO2 22 - 32 mmol/L _1 Calcium 8.9 - 10.3 mg/dL 7.3(L) 6.7(L) 6.6(L)  Total Protein 6.5 - 8.1 g/dL 5.2(L) 5.4(L) 5.7(L)  Total Bilirubin 0.3 - 1.2 mg/dL 1.1 1.3(H) 0.8  Alkaline Phos 38 - 126 U/L 261(H) 292(H) 420(H)  AST 15 - 41 U/L 57(H) 63(H) 76(H)  ALT 0 - 44 U/L 33 38 48(H)    Lab Results  Component Value Date   WBC 1.6 (L) 03/21/2019   HGB 7.5 (L) 03/21/2019   HCT 21.6 (L) 03/21/2019   MCV 87.4 03/21/2019   PLT 30 (L) 03/21/2019   NEUTROABS 0.9 (L) 03/21/2019    ASSESSMENT & PLAN:  Malignant neoplasm of lower-inner quadrant of right breast of female, estrogen receptor positive ([facility]) 08/16/2017:Right lumpectomy: Grade 2 IDC 1.5 cm, with DCIS and necrosis, 0/3 lymph nodes negative, ER 95%, PR 30%, HER-2 negative ratio 1.14, Ki-67 40%, T1CN0 stage Ia; resection of the margin 09/11/2017: Benign  01/09/2019: PET CT scan:Pulmonary hypermetabolic and consistent with lymphangitic tumor spread, confluent right middle lobe consolidation, hypermetabolic abdominal, cervical and left axillary lymph nodes, diffuse liver hypermetabolic some nonspecific. Diffuse bone marrow hypermetabolism worrisome for metastatic disease. Prior pericardial effusion resolved.  01/16/2019:Pleural effusion: Thoracentesis revealed metastatic adenocarcinoma breast primary ER 75%, PR 50%, HER-2 -1+ Bone marrow biopsy positive for metastatic breast cancer  [facility] molecular testing: ER/PR positive HER-2 negative, ER positive, TMB low (1), BRCA 1 and 2 -, ESR 1 mutation not detected, PI K3 CA negative Guardant  360: T p53, no evidence of MSI high -------------------------------------------------------------------------------------------------------------------------------------------------------------- Current treatment: Letrozole 2.5 mg daily started 01/15/2019, Ibrance started 01/24/2019, Faslodex and Xgeva added 02/21/2019 Hospitalization: 02/03/19- 02/07/19 Malignant Pleural effusion S/P thoracentesis status post Pleurx catheter placement 03/11/2019  Plan: 1.Faslodex will be added to the treatment 2.ContinueIbrance 75 mg 3.Xgeva q 3 months  Severe anemia with symptoms: Hemoglobin is 7.5: Type and cross and transfuse 2 units of PRBC. Severe thrombocytopenia: Platelet count up to 30  CT scan scheduled for 04/08/2019 Telephone visit after that to go over the results. Return to see me in January with labs and follow-up.    No orders of the defined types were placed in this encounter.  The patient has a  good understanding of the overall plan. she agrees with it. she will call with any problems that may develop before the next visit here.  Nicholas Lose, MD 03/21/2019  Julious Oka Dorshimer, am acting as scribe for Dr. Nicholas Lose.  I have reviewed the above document for accuracy and completeness, and I agree with the above.

## 2019-03-20 NOTE — Telephone Encounter (Signed)
Spoke with Beth at [facility], aware of recs.  VO given.  Nothing further needed at this time- will close encounter.

## 2019-03-20 NOTE — Telephone Encounter (Signed)
Spoke with Beth at [facility], requesting a verbal order for home health nursing.  Order is for pleurex teaching, 2 visits weekly X2 weeks, then 1 visit weekly X1 week.   Beth would also like clarification on how often pt is to drain pleurex.  There was no outline in yesterday's office note or on the order.  Beth thinks that pt is to have this drained 3X weekly, while pt states she was told to drain it every day.   Sending to APP since Dr. Valeta Harms is not in office today.  TP please advise.  Thanks!

## 2019-03-20 NOTE — Telephone Encounter (Signed)
Jasmine Allen can you address as you saw this lady

## 2019-03-20 NOTE — Telephone Encounter (Signed)
Sorry I am sure this can be confusing as patient has seen multiple pulmonary providers in our office.  Patient's actual pulmonologist in office is Dr. Tamala Julian.  She was last seen by him on 03/14/2019.  His AVS states:  - Drain every Monday/Wednesday/Friday up to 1 liter or when you start feeling pain, whichever comes first - Call us if your site looks red/inflammed - Sent you email on drainage procedure - Also notify us if you are getting < 100cc on consecutive drainage trials or breathing gets worse - Come back next week with Dr. Valeta Harms to get sutures taken out   Okay to place home health order for education as well as Monday Wednesday Friday drainage up to 1 L.  Please also notify them that we should be notified if the patient has 2 consecutive drains less than 100 cc or if patient's breathing is worsening.  This can be further evaluated next week as I will see the patient back in office.  We already know the patient has some redness around that site and she is currently on Keflex for that.  Wyn Quaker, FNP

## 2019-03-21 ENCOUNTER — Other Ambulatory Visit: Payer: Self-pay

## 2019-03-21 ENCOUNTER — Ambulatory Visit: Payer: 59

## 2019-03-21 ENCOUNTER — Inpatient Hospital Stay (HOSPITAL_BASED_OUTPATIENT_CLINIC_OR_DEPARTMENT_OTHER): Payer: 59 | Admitting: Hematology and Oncology

## 2019-03-21 ENCOUNTER — Inpatient Hospital Stay: Payer: 59

## 2019-03-21 ENCOUNTER — Telehealth: Payer: Self-pay | Admitting: *Deleted

## 2019-03-21 ENCOUNTER — Other Ambulatory Visit: Payer: Self-pay | Admitting: Emergency Medicine

## 2019-03-21 ENCOUNTER — Other Ambulatory Visit: Payer: Self-pay | Admitting: *Deleted

## 2019-03-21 DIAGNOSIS — C50311 Malignant neoplasm of lower-inner quadrant of right female breast: Secondary | ICD-10-CM

## 2019-03-21 DIAGNOSIS — Z170 Estrogen receptor positive status [ER+]: Secondary | ICD-10-CM

## 2019-03-21 DIAGNOSIS — C50919 Malignant neoplasm of unspecified site of unspecified female breast: Secondary | ICD-10-CM

## 2019-03-21 DIAGNOSIS — Z95828 Presence of other vascular implants and grafts: Secondary | ICD-10-CM

## 2019-03-21 LAB — CBC WITH DIFFERENTIAL (CANCER CENTER ONLY)
Abs Immature Granulocytes: 0.02 10*3/uL (ref 0.00–0.07)
Basophils Absolute: 0 10*3/uL (ref 0.0–0.1)
Basophils Relative: 1 %
Eosinophils Absolute: 0 10*3/uL (ref 0.0–0.5)
Eosinophils Relative: 1 %
HCT: 21.6 % — ABNORMAL LOW (ref 36.0–46.0)
Hemoglobin: 7.5 g/dL — ABNORMAL LOW (ref 12.0–15.0)
Immature Granulocytes: 1 %
Lymphocytes Relative: 30 %
Lymphs Abs: 0.5 10*3/uL — ABNORMAL LOW (ref 0.7–4.0)
MCH: 30.4 pg (ref 26.0–34.0)
MCHC: 34.7 g/dL (ref 30.0–36.0)
MCV: 87.4 fL (ref 80.0–100.0)
Monocytes Absolute: 0.1 10*3/uL (ref 0.1–1.0)
Monocytes Relative: 7 %
Neutro Abs: 0.9 10*3/uL — ABNORMAL LOW (ref 1.7–7.7)
Neutrophils Relative %: 60 %
Platelet Count: 30 10*3/uL — ABNORMAL LOW (ref 150–400)
RBC: 2.47 MIL/uL — ABNORMAL LOW (ref 3.87–5.11)
RDW: 21 % — ABNORMAL HIGH (ref 11.5–15.5)
WBC Count: 1.6 10*3/uL — ABNORMAL LOW (ref 4.0–10.5)
nRBC: 1.9 % — ABNORMAL HIGH (ref 0.0–0.2)

## 2019-03-21 LAB — CMP (CANCER CENTER ONLY)
ALT: 33 U/L (ref 0–44)
AST: 57 U/L — ABNORMAL HIGH (ref 15–41)
Albumin: 2.5 g/dL — ABNORMAL LOW (ref 3.5–5.0)
Alkaline Phosphatase: 261 U/L — ABNORMAL HIGH (ref 38–126)
Anion gap: 7 (ref 5–15)
BUN: 8 mg/dL (ref 6–20)
CO2: 28 mmol/L (ref 22–32)
Calcium: 7.3 mg/dL — ABNORMAL LOW (ref 8.9–10.3)
Chloride: 101 mmol/L (ref 98–111)
Creatinine: 0.66 mg/dL (ref 0.44–1.00)
GFR, Est AFR Am: >60 mL/min (ref >60)
GFR, Estimated: >60 mL/min (ref >60)
Glucose, Bld: 300 mg/dL — ABNORMAL HIGH (ref 70–99)
Potassium: 3.4 mmol/L — ABNORMAL LOW (ref 3.5–5.1)
Sodium: 136 mmol/L (ref 135–145)
Total Bilirubin: 1.1 mg/dL (ref 0.3–1.2)
Total Protein: 5.2 g/dL — ABNORMAL LOW (ref 6.5–8.1)

## 2019-03-21 LAB — PREPARE RBC (CROSSMATCH)

## 2019-03-21 LAB — SAMPLE TO BLOOD BANK

## 2019-03-21 MED ORDER — SODIUM CHLORIDE 0.9% IV SOLUTION
250.0000 mL | Freq: Once | INTRAVENOUS | Status: AC
  Administered 2019-03-21: 250 mL via INTRAVENOUS
  Filled 2019-03-21: qty 250

## 2019-03-21 MED ORDER — ACETAMINOPHEN 325 MG PO TABS
650.0000 mg | ORAL_TABLET | Freq: Once | ORAL | Status: AC
  Administered 2019-03-21: 650 mg via ORAL

## 2019-03-21 MED ORDER — FULVESTRANT 250 MG/5ML IM SOLN
INTRAMUSCULAR | Status: AC
  Filled 2019-03-21: qty 5

## 2019-03-21 MED ORDER — ACETAMINOPHEN 325 MG PO TABS
ORAL_TABLET | ORAL | Status: AC
  Filled 2019-03-21: qty 2

## 2019-03-21 MED ORDER — FULVESTRANT 250 MG/5ML IM SOLN
500.0000 mg | Freq: Once | INTRAMUSCULAR | Status: AC
  Administered 2019-03-21: 500 mg via INTRAMUSCULAR

## 2019-03-21 MED ORDER — DIPHENHYDRAMINE HCL 25 MG PO CAPS
25.0000 mg | ORAL_CAPSULE | Freq: Once | ORAL | Status: AC
  Administered 2019-03-21: 25 mg via ORAL

## 2019-03-21 MED ORDER — DIPHENHYDRAMINE HCL 25 MG PO CAPS
ORAL_CAPSULE | ORAL | Status: AC
  Filled 2019-03-21: qty 1

## 2019-03-21 NOTE — Patient Instructions (Signed)
Fulvestrant injection What is this medicine? FULVESTRANT (ful VES trant) blocks the effects of estrogen. It is used to treat breast cancer. This medicine may be used for other purposes; ask your health care provider or pharmacist if you have questions. COMMON BRAND NAME(S): FASLODEX What should I tell my health care provider before I take this medicine? They need to know if you have any of these conditions:  bleeding disorders  liver disease  low blood counts, like low white cell, platelet, or red cell counts  an unusual or allergic reaction to fulvestrant, other medicines, foods, dyes, or preservatives  pregnant or trying to get pregnant  breast-feeding How should I use this medicine? This medicine is for injection into a muscle. It is usually given by a health care professional in a hospital or clinic setting. Talk to your pediatrician regarding the use of this medicine in children. Special care may be needed. Overdosage: If you think you have taken too much of this medicine contact a poison control center or emergency room at once. NOTE: This medicine is only for you. Do not share this medicine with others. What if I miss a dose? It is important not to miss your dose. Call your doctor or health care professional if you are unable to keep an appointment. What may interact with this medicine?  medicines that treat or prevent blood clots like warfarin, enoxaparin, dalteparin, apixaban, dabigatran, and rivaroxaban This list may not describe all possible interactions. Give your health care provider a list of all the medicines, herbs, non-prescription drugs, or dietary supplements you use. Also tell them if you smoke, drink alcohol, or use illegal drugs. Some items may interact with your medicine. What should I watch for while using this medicine? Your condition will be monitored carefully while you are receiving this medicine. You will need important blood work done while you are taking  this medicine. Do not become pregnant while taking this medicine or for at least 1 year after stopping it. Women of child-bearing potential will need to have a negative pregnancy test before starting this medicine. Women should inform their doctor if they wish to become pregnant or think they might be pregnant. There is a potential for serious side effects to an unborn child. Men should inform their doctors if they wish to father a child. This medicine may lower sperm counts. Talk to your health care professional or pharmacist for more information. Do not breast-feed an infant while taking this medicine or for 1 year after the last dose. What side effects may I notice from receiving this medicine? Side effects that you should report to your doctor or health care professional as soon as possible:  allergic reactions like skin rash, itching or hives, swelling of the face, lips, or tongue  feeling faint or lightheaded, falls  pain, tingling, numbness, or weakness in the legs  signs and symptoms of infection like fever or chills; cough; flu-like symptoms; sore throat  vaginal bleeding Side effects that usually do not require medical attention (report to your doctor or health care professional if they continue or are bothersome):  aches, pains  constipation  diarrhea  headache  hot flashes  nausea, vomiting  pain at site where injected  stomach pain This list may not describe all possible side effects. Call your doctor for medical advice about side effects. You may report side effects to FDA at 1-800-FDA-1088. Where should I keep my medicine? This drug is given in a hospital or clinic and will   not be stored at home. NOTE: This sheet is a summary. It may not cover all possible information. If you have questions about this medicine, talk to your doctor, pharmacist, or health care provider.  2020 Elsevier/Gold Standard (2017-07-05 11:34:41)  

## 2019-03-21 NOTE — Patient Instructions (Signed)
Blood Transfusion, Adult, Care After This sheet gives you information about how to care for yourself after your procedure. Your doctor may also give you more specific instructions. If you have problems or questions, contact your doctor. Follow these instructions at home:   Take over-the-counter and prescription medicines only as told by your doctor.  Go back to your normal activities as told by your doctor.  Follow instructions from your doctor about how to take care of the area where an IV tube was put into your vein (insertion site). Make sure you: ? Wash your hands with soap and water before you change your bandage (dressing). If there is no soap and water, use hand sanitizer. ? Change your bandage as told by your doctor.  Check your IV insertion site every day for signs of infection. Check for: ? More redness, swelling, or pain. ? More fluid or blood. ? Warmth. ? Pus or a bad smell. Contact a doctor if:  You have more redness, swelling, or pain around the IV insertion site.  You have more fluid or blood coming from the IV insertion site.  Your IV insertion site feels warm to the touch.  You have pus or a bad smell coming from the IV insertion site.  Your pee (urine) turns pink, red, or brown.  You feel weak after doing your normal activities. Get help right away if:  You have signs of a serious allergic or body defense (immune) system reaction, including: ? Itchiness. ? Hives. ? Trouble breathing. ? Anxiety. ? Pain in your chest or lower back. ? Fever, flushing, and chills. ? Fast pulse. ? Rash. ? Watery poop (diarrhea). ? Throwing up (vomiting). ? Dark pee. ? Serious headache. ? Dizziness. ? Stiff neck. ? Yellow color in your face or the white parts of your eyes (jaundice). Summary  After a blood transfusion, return to your normal activities as told by your doctor.  Every day, check for signs of infection where the IV tube was put into your vein.  Some  signs of infection are warm skin, more redness and pain, more fluid or blood, and pus or a bad smell where the needle went in.  Contact your doctor if you feel weak or have any unusual symptoms. This information is not intended to replace advice given to you by your health care provider. Make sure you discuss any questions you have with your health care provider. Document Released: 04/17/2014 Document Revised: 08/01/2017 Document Reviewed: 11/19/2015 Elsevier Patient Education  2020 [employer]  

## 2019-03-21 NOTE — Telephone Encounter (Signed)
Received call from [place], RN with [facility] stating pt fell at home 2 nights ago.  Denies any injury and pt stated she was very weak and lost her balance.  Pt coming in to office today for follow up with MD.  RN will make MD aware.

## 2019-03-21 NOTE — Progress Notes (Signed)
Pt received 2 units PRBCs today, tolerated well.  VSS.  Able to eat and drink during infusion w/out any issues.  Reports feeling better at end of tx.

## 2019-03-21 NOTE — Assessment & Plan Note (Signed)
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 Diffuse bone marrow hypermetabolism worrisome for metastatic disease. Prior pericardial effusion resolved.  01/16/2019:Pleural effusion: Thoracentesis revealed metastatic adenocarcinoma breast primary ER 75%, PR 50%, HER-2 -1+ Bone marrow biopsy positive for metastatic breast cancer  [facility] molecular testing: ER/PR positive HER-2 negative, ER positive, TMB low (1), BRCA 1 and 2 -, ESR 1 mutation not detected, PI K3 CA negative PD-L1 by guardant will be obtained -------------------------------------------------------------------------------------------------------------------------------------------------------------- Current treatment: Letrozole 2.5 mg daily started 01/15/2019, Ibrance started 01/24/2019, Faslodex and Xgeva added 02/21/2019 Hospitalization: 02/03/19- 02/07/19 Malignant Pleural effusion S/P thoracentesis Plan: 1.Faslodex will be added to the treatment 2.ContinueIbrance 75 mg 3.Xgeva q 3 months  CT scan scheduled for 04/08/2019 I will see her after that to go over the results.

## 2019-03-24 ENCOUNTER — Telehealth: Payer: Self-pay | Admitting: *Deleted

## 2019-03-24 ENCOUNTER — Telehealth: Payer: Self-pay | Admitting: Pharmacy Technician

## 2019-03-24 ENCOUNTER — Telehealth: Payer: Self-pay | Admitting: Pulmonary Disease

## 2019-03-24 ENCOUNTER — Telehealth: Payer: Self-pay | Admitting: Hematology and Oncology

## 2019-03-24 DIAGNOSIS — J90 Pleural effusion, not elsewhere classified: Secondary | ICD-10-CM

## 2019-03-24 LAB — BPAM RBC
Blood Product Expiration Date: 202101122359
Blood Product Expiration Date: 202101122359
ISSUE DATE / TIME: 202012111259
ISSUE DATE / TIME: 202012111259
Unit Type and Rh: 9500
Unit Type and Rh: 9500

## 2019-03-24 LAB — TYPE AND SCREEN
ABO/RH(D): O NEG
Antibody Screen: NEGATIVE
Unit division: 0
Unit division: 0

## 2019-03-24 NOTE — Telephone Encounter (Signed)
Received VM from pt, attempt x1 to return call, no answer, LVM to return call to the office.  

## 2019-03-24 NOTE — Telephone Encounter (Signed)
Spoke with patient. She stated that she will finish the Keflex tomorrow. She is feeling much better. She and her husband are draining the Pleurx catheter every other day or 3 times a week. The catheter was drained today and she collected 350cc. She stated that the site looks great, no redness or pain. She also denied any fevers.   She has a home health nurse who will be visiting her on Wednesday to look at the site. She has an appt on Thursday. She wanted to know if she could cancel the Thursday appt since she is feeling better. Advised her that I would check with Aaron Edelman since she is scheduled with him on Thursday.   Aaron Edelman, please advise. Thanks!

## 2019-03-24 NOTE — Telephone Encounter (Signed)
I left a message regarding schedule  

## 2019-03-24 NOTE — Telephone Encounter (Signed)
I believe that is reasonable.  Patient is reporting clinical improvement after Keflex.  Patient also reporting that home health nurse has evaluated drain site and states it is no longer red.  I believe it is reasonable for her to be able to cancel her 1 week follow-up visit.  Patient is to notify us if home health starts to notice redness again or if patient starts to clinically worsen.  Patient needs to continue to follow drainage schedule.  I discussed this with Dr. Valeta Harms.  He agrees.  Patient should be scheduled for follow-up with Dr. Tamala Julian in 4 to 6 weeks.  Wyn Quaker, FNP

## 2019-03-24 NOTE — Telephone Encounter (Signed)
Oral Oncology Patient Advocate Encounter   Was successful in obtaining a copay card for Ibrance.  This copay card will make the patients copay $0.00.  I have spoken with the patient.    The billing information is as follows and has been shared with [facility].   RxBin: [id number] Member ID: [id number] Group ID: [id number] Expires: 04/09/2021     Jasmine Allen Patient [place] Phone [phone] Fax [phone] 03/24/2019 1:03 PM

## 2019-03-25 NOTE — Telephone Encounter (Signed)
I have scheduled pt with Dr. Tamala Julian for January appt - pt states that she "MAY" run out of supplies and would like to know when she should call if she starts to run out - please advise pt at CB# [phone]

## 2019-03-25 NOTE — Telephone Encounter (Signed)
Called and spoke to pt. Pt thinks she will likely run out of supplies for her pleurx by her appt with Dr. Tamala Julian in Jan 2021. New order placed. Pt verbalized understanding and denied any further questions or concerns at this time.

## 2019-03-25 NOTE — Telephone Encounter (Signed)
Left message for patient to call back. Dr. Thompson Caul schedule is open for 1/18, 1/19 and 1/20.

## 2019-03-26 ENCOUNTER — Telehealth: Payer: Self-pay | Admitting: Internal Medicine

## 2019-03-26 NOTE — Telephone Encounter (Signed)
Spoke with Beth with [facility]  They are needing verbal for coders that pt had cellulitis last visit  Per ov notes I have given this verbal that there was possible cellulitis  Nothing further needed

## 2019-03-27 ENCOUNTER — Ambulatory Visit: Payer: 59 | Admitting: Pulmonary Disease

## 2019-04-03 ENCOUNTER — Ambulatory Visit: Payer: 59

## 2019-04-06 ENCOUNTER — Other Ambulatory Visit: Payer: Self-pay | Admitting: Hematology and Oncology

## 2019-04-07 ENCOUNTER — Ambulatory Visit: Payer: 59

## 2019-04-07 ENCOUNTER — Other Ambulatory Visit: Payer: Self-pay

## 2019-04-07 ENCOUNTER — Telehealth: Payer: Self-pay | Admitting: Internal Medicine

## 2019-04-07 DIAGNOSIS — C50311 Malignant neoplasm of lower-inner quadrant of right female breast: Secondary | ICD-10-CM

## 2019-04-07 DIAGNOSIS — C50919 Malignant neoplasm of unspecified site of unspecified female breast: Secondary | ICD-10-CM

## 2019-04-07 NOTE — Progress Notes (Signed)
Patient Care Team: Aletha Halim., PA-C as PCP - General (Family Medicine) Nicholas Lose, MD as Consulting Physician (Hematology and Oncology) Kyung Rudd, MD as Consulting Physician (Radiation Oncology) Rolm Bookbinder, MD as Consulting Physician (General Surgery)  DIAGNOSIS:    ICD-10-CM   1. Malignant neoplasm of lower-inner quadrant of right breast of female, estrogen receptor positive ([place])  C50.311    Z17.0     SUMMARY OF ONCOLOGIC HISTORY: Oncology History  Malignant neoplasm of lower-inner quadrant of right breast of female, estrogen receptor positive ([place])  08/16/2017 Initial Diagnosis   Right lumpectomy: 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  High risk   08/16/2017 Cancer Staging   Staging form: Breast, AJCC 8th Edition - Pathologic stage from 08/16/2017: Stage IA (pT1c, pN0, cM0, G2, ER+, PR+, HER2-, Oncotype DX score: 31) - Signed by Gardenia Phlegm, NP on 09/12/2018   09/25/2017 - 02/05/2018 Chemotherapy   Adjuvant chemotherapy with dose dense Adriamycin and Cytoxan x4 followed by Taxol weekly x12    03/11/2018 - 04/25/2018 Radiation Therapy   Adjuvant XRT   01/09/2019 PET scan   Pulmonary hypermetabolic and consistent with lymphangitic tumor spread, confluent right middle lobe consolidation, hypermetabolic abdominal, cervical and left axillary lymph nodes, diffuse liver hypermetabolic some nonspecific.  Diffuse bone marrow hypermetabolism worrisome for metastatic disease.  Prior pericardial effusion resolved.   01/16/2019 Relapse/Recurrence   Pleural effusion: Thoracentesis revealed metastatic adenocarcinoma breast primary ER 75%, PR 50%, HER-2 -1+ Bone marrow biopsy positive for metastatic breast cancer     CHIEF  COMPLIANT: Follow-up of metastatic breast cancer  INTERVAL HISTORY: Jasmine Allen is a 53 y.o. 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 She is currently on treatment with letrozole,Ibrance, and Faslodex. She presents to the clinic todayfor follow-up and a toxicity check.  REVIEW OF SYSTEMS:   Constitutional: Denies fevers, chills or abnormal weight loss Eyes: Denies blurriness of vision Ears, nose, mouth, throat, and face: Denies mucositis or sore throat Respiratory: Denies cough, dyspnea or wheezes Cardiovascular: Denies palpitation, chest discomfort Gastrointestinal: Denies nausea, heartburn or change in bowel habits Skin: Denies abnormal skin rashes Lymphatics: Denies new lymphadenopathy or easy bruising Neurological: Denies numbness, tingling or new weaknesses Behavioral/Psych: Mood is stable, no new changes  Extremities: No lower extremity edema Breast: denies any pain or lumps or nodules in either breasts All other systems were reviewed with the patient and are negative.  I have reviewed the past medical history, past surgical history, social history and family history with the patient and they are unchanged from previous note.  ALLERGIES:  is allergic to amoxicillin-pot clavulanate; erythromycin; metoclopramide hcl; and sulfonamide derivatives.  MEDICATIONS:  Current Outpatient Medications  Medication Sig Dispense Refill  . albuterol (PROVENTIL) (2.5 MG/3ML) 0.083% nebulizer solution Inhale 3 mLs into the lungs every 6 (six) hours as needed for wheezing or shortness of breath (cough). 360 mL 0  . ALPRAZolam (XANAX) 0.25 MG tablet Take 1 tablet (0.25 mg total) by mouth at bedtime as needed for anxiety. 30 tablet 3  . atorvastatin (LIPITOR) 20 MG tablet Take 20 mg by mouth at bedtime.     . Biotin 10000 MCG TABS Take 10,000 mcg by mouth  daily.     . bumetanide (BUMEX) 1 MG tablet TAKE 1 TABLET(1 MG) BY MOUTH  TWICE DAILY (Patient taking differently: Take 1 mg by mouth 2 (two) times daily. ) 60 tablet 2  . cetirizine (ZYRTEC) 10 MG tablet Take 10 mg by mouth daily.    . chlorpheniramine-HYDROcodone (TUSSIONEX PENNKINETIC ER) 10-8 MG/5ML SUER Take 5 mLs by mouth 2 (two) times daily. 140 mL 0  . cyclobenzaprine (FLEXERIL) 10 MG tablet Take 10 mg by mouth every 8 (eight) hours as needed for muscle spasms.  1  . diphenoxylate-atropine (LOMOTIL) 2.5-0.025 MG tablet Take 1 tablet by mouth 4 (four) times daily as needed for diarrhea or loose stools. 30 tablet 1  . hydrOXYzine (ATARAX/VISTARIL) 10 MG tablet Take 10-30 mg by mouth at bedtime as needed for itching.   0  . hyoscyamine (LEVSIN SL) 0.125 MG SL tablet dissolve 1 tablet under the tongue every 4 hours if needed (Patient taking differently: Take 0.125 mg by mouth every 4 (four) hours as needed for cramping. ) 30 tablet 0  . letrozole (FEMARA) 2.5 MG tablet Take 1 tablet (2.5 mg total) by mouth daily. 90 tablet 3  . LORazepam (ATIVAN) 0.5 MG tablet Take 1 tablet (0.5 mg total) by mouth every 8 (eight) hours. 30 tablet 3  . morphine (MS CONTIN) 15 MG 12 hr tablet Take 1 tablet (15 mg total) by mouth every 12 (twelve) hours. 60 tablet 0  . morphine (MSIR) 15 MG tablet Take 1 tablet (15 mg total) by mouth every 6 (six) hours as needed for severe pain. 60 tablet 0  . Multiple Vitamin (MULITIVITAMIN WITH MINERALS) TABS Take 1 tablet by mouth daily.    . palbociclib (IBRANCE) 75 MG tablet Take 75 mg by mouth daily. Take for 21 days on, 7 days off, repeat every 28 days.    . pantoprazole (PROTONIX) 40 MG tablet Take 1 tablet (40 mg total) by mouth 2 (two) times daily before a meal. (Patient taking differently: Take 40 mg by mouth 2 (two) times daily. ) 60 tablet 6  . polyethylene glycol (MIRALAX / GLYCOLAX) 17 g packet Take 17 g by mouth 2 (two) times daily. Decrease to daily if having watery or multiple loose stools daily. 30 each 0  . potassium chloride SA  (KLOR-CON) 20 MEQ tablet TAKE 1 TABLET(20 MEQ) BY MOUTH DAILY 30 tablet 1  . senna (SENOKOT) 8.6 MG tablet Take by mouth.    . traZODone (DESYREL) 100 MG tablet Take 150 mg by mouth at bedtime.     . valACYclovir (VALTREX) 1000 MG tablet Take by mouth.     No current facility-administered medications for this visit.   Facility-Administered Medications Ordered in Other Visits  Medication Dose Route Frequency Provider Last Rate Last Admin  . sodium chloride (PF) 0.9 % injection             PHYSICAL EXAMINATION: ECOG PERFORMANCE STATUS: 1 - Symptomatic but completely ambulatory  There were no vitals filed for this visit. There were no vitals filed for this visit.  GENERAL: alert, no distress and comfortable SKIN: skin color, texture, turgor are normal, no rashes or significant lesions EYES: normal, Conjunctiva are pink and non-injected, sclera clear OROPHARYNX: no exudate, no erythema and lips, buccal mucosa, and tongue normal  NECK: supple, thyroid normal size, non-tender, without nodularity LYMPH: no palpable lymphadenopathy in the cervical, axillary or inguinal LUNGS: clear to auscultation and percussion with normal breathing effort HEART: regular rate & rhythm and  no murmurs and no lower extremity edema ABDOMEN: abdomen soft, non-tender and normal bowel sounds MUSCULOSKELETAL: no cyanosis of digits and no clubbing  NEURO: alert & oriented x 3 with fluent speech, no focal motor/sensory deficits EXTREMITIES: No lower extremity edema  LABORATORY DATA:  I have reviewed the data as listed CMP Latest Ref Rng & Units 03/21/2019 03/07/2019 02/26/2019  Glucose 70 - 99 mg/dL 300(H) 253(H) 216(H)  BUN 6 - 20 mg/dL 8 7 10   Creatinine 0.44 - 1.00 mg/dL 0.66 0.64 0.73  Sodium 135 - 145 mmol/L 136 133(L) 136  Potassium 3.5 - 5.1 mmol/L 3.4(L) 3.0(LL) 3.7  Chloride 98 - 111 mmol/L 101 100 100  CO2 22 - 32 mmol/L 28 23 25   Calcium 8.9 - 10.3 mg/dL 7.3(L) 6.7(L) 6.6(L)  Total Protein 6.5 -  8.1 g/dL 5.2(L) 5.4(L) 5.7(L)  Total Bilirubin 0.3 - 1.2 mg/dL 1.1 1.3(H) 0.8  Alkaline Phos 38 - 126 U/L 261(H) 292(H) 420(H)  AST 15 - 41 U/L 57(H) 63(H) 76(H)  ALT 0 - 44 U/L 33 38 48(H)    Lab Results  Component Value Date   WBC 2.5 (L) 04/08/2019   HGB 11.0 (L) 04/08/2019   HCT 32.3 (L) 04/08/2019   MCV 91.2 04/08/2019   PLT 36 (L) 04/08/2019   NEUTROABS 1.3 (L) 04/08/2019    ASSESSMENT & PLAN:  Malignant neoplasm of lower-inner quadrant of right breast of female, estrogen receptor positive ([facility]) 08/16/2017:Right lumpectomy: Grade 2 IDC 1.5 cm, with DCIS and necrosis, 0/3 lymph nodes negative, ER 95%, PR 30%, HER-2 negative ratio 1.14, Ki-67 40%, T1CN0 stage Ia; resection of the margin 09/11/2017: Benign  01/09/2019: PET CT scan:Pulmonary hypermetabolic and consistent with lymphangitic tumor spread, confluent right middle lobe consolidation, hypermetabolic abdominal, cervical and left axillary lymph nodes, diffuse liver hypermetabolic some nonspecific. Diffuse bone marrow hypermetabolism worrisome for metastatic disease. Prior pericardial effusion resolved.  01/16/2019:Pleural effusion: Thoracentesis revealed metastatic adenocarcinoma breast primary ER 75%, PR 50%, HER-2 -1+ Bone marrow biopsy positive for metastatic breast cancer  [facility] molecular testing: ER/PR positive HER-2 negative, ER positive, TMB low (1), BRCA 1 and 2 -, ESR 1 mutation not detected, PI K3 CA negative Guardant 360: T p53, no evidence of MSI high -------------------------------------------------------------------------------------------------------------------------------------------------------------- Current treatment: Letrozole 2.5 mg daily started 01/15/2019, Ibrance started 01/24/2019, Faslodex and Xgeva added 02/21/2019 Hospitalization: 02/03/19- 02/07/19 Malignant Pleural effusion S/P thoracentesis status post Pleurx catheter placement 03/11/2019  Plan: 1.Faslodex added to the treatment  02/21/2019 2. Ibrance 75 mg 3.Xgeva q 3 months  Severe anemia with symptoms: Hemoglobin is 11: 2 units of PRBC previously given.  Severe thrombocytopenia: Platelet count up to 36 Severe fatigue: Unclear etiology: Could be psychological.  CT scan 04/08/2019: Positive response to treatment with regression of the lung changes and stable liver and bone lesions. Patient was thrilled to hear these results.  Return to clinic in 4 weeks with labs and follow-up   No orders of the defined types were placed in this encounter.  The patient has a good understanding of the overall plan. she agrees with it. she will call with any problems that may develop before the next visit here.  Nicholas Lose, MD 04/08/2019  Julious Oka Dorshimer, am acting as scribe for Dr. Nicholas Lose.  I have reviewed the above document for accuracy and completeness, and I agree with the above.

## 2019-04-07 NOTE — Telephone Encounter (Signed)
PCCM: Yes, ok for home health orders Garner Nash, DO [facility] 04/07/2019 6:51 PM

## 2019-04-07 NOTE — Telephone Encounter (Signed)
Dr. Valeta Harms,  This patient was seen in clinic by you on 03/17/19 for metastatic breast cancer and malignant pleural effusion.  Below is a cut/paste of the office note:  "This is a 53 year old female followed in our office for Pleurx catheter placement regarding malignant pleural effusion.  Came on December 7 for Pleurx catheter drainage because of issues with having home health.  She had not received the bottles via [employer].  They were delivered yesterday.  Here today for suture removal and will plans for Pleurx drainage.The catheter was place on Dec 1st.   Patient seen today in the office.  Did have drainage of the catheter yesterday.  She has noticed some soreness at the catheter insertion site.  Examination of this site today in the office does reveal some redness that does track back approximately halfway.  Chest Imaging: CXR: good catheter position at time of placement  The patient's images have been independently reviewed by me.    Pulmonary Functions Testing Results: No flowsheet data found.  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  Patient has possible chest wall cellulitis at insertion site  Plan:  Today in the office for planned procedure for drainage.  Procedure note: Using sterile technique and prepackaged Pleurx catheter bottles.  As well as suture removal kit.  Sutures were clipped and removed from previous insertion site around tube.  Insertion site examined with areas of erythema at the insertion site tracking back approximately 1 cm.  Please refer image above.  Drainage catheter was connected after cleaning with alcohol wipe.  Approximately 250 cc of yellow fluid was drained from the pleural space into Vacutainer.  Patient tolerated  procedure well with developing mild chest discomfort for a few moments at the conclusion of the procedure.  Pain dissipated.  There was no blood loss.  As per the patient's possible chest wall cellulitis at insertion site we will start Keflex 500 mg twice daily for 7 days.  Follow-up in our office in 1 week to look at wound.  Greater than 50% of this patient's 50-minute office was in face-to-face discussing the recommendations treatment plan."  Based on this information, can approval be given for the order request from [facility]? Thank you.

## 2019-04-08 ENCOUNTER — Ambulatory Visit (HOSPITAL_COMMUNITY)
Admission: RE | Admit: 2019-04-08 | Discharge: 2019-04-08 | Disposition: A | Discharge status: Home / Self Care | Payer: 59 | Source: Ambulatory Visit | Attending: Hematology and Oncology | Admitting: Hematology and Oncology

## 2019-04-08 ENCOUNTER — Inpatient Hospital Stay (HOSPITAL_BASED_OUTPATIENT_CLINIC_OR_DEPARTMENT_OTHER): Payer: 59 | Admitting: Hematology and Oncology

## 2019-04-08 ENCOUNTER — Other Ambulatory Visit: Payer: Self-pay

## 2019-04-08 ENCOUNTER — Inpatient Hospital Stay: Payer: 59

## 2019-04-08 ENCOUNTER — Encounter (HOSPITAL_COMMUNITY): Payer: Self-pay

## 2019-04-08 DIAGNOSIS — Z170 Estrogen receptor positive status [ER+]: Secondary | ICD-10-CM | POA: Insufficient documentation

## 2019-04-08 DIAGNOSIS — C50311 Malignant neoplasm of lower-inner quadrant of right female breast: Secondary | ICD-10-CM | POA: Insufficient documentation

## 2019-04-08 LAB — CMP (CANCER CENTER ONLY)
ALT: 35 U/L (ref 0–44)
AST: 67 U/L — ABNORMAL HIGH (ref 15–41)
Albumin: 2.6 g/dL — ABNORMAL LOW (ref 3.5–5.0)
Alkaline Phosphatase: 213 U/L — ABNORMAL HIGH (ref 38–126)
Anion gap: 9 (ref 5–15)
BUN: 5 mg/dL — ABNORMAL LOW (ref 6–20)
CO2: 27 mmol/L (ref 22–32)
Calcium: 7.5 mg/dL — ABNORMAL LOW (ref 8.9–10.3)
Chloride: 101 mmol/L (ref 98–111)
Creatinine: 0.61 mg/dL (ref 0.44–1.00)
GFR, Est AFR Am: >60 mL/min (ref >60)
GFR, Estimated: >60 mL/min (ref >60)
Glucose, Bld: 128 mg/dL — ABNORMAL HIGH (ref 70–99)
Potassium: 3.1 mmol/L — ABNORMAL LOW (ref 3.5–5.1)
Sodium: 137 mmol/L (ref 135–145)
Total Bilirubin: 1.6 mg/dL — ABNORMAL HIGH (ref 0.3–1.2)
Total Protein: 5.5 g/dL — ABNORMAL LOW (ref 6.5–8.1)

## 2019-04-08 LAB — CBC WITH DIFFERENTIAL (CANCER CENTER ONLY)
Abs Immature Granulocytes: 0.03 10*3/uL (ref 0.00–0.07)
Basophils Absolute: 0 10*3/uL (ref 0.0–0.1)
Basophils Relative: 1 %
Eosinophils Absolute: 0 10*3/uL (ref 0.0–0.5)
Eosinophils Relative: 1 %
HCT: 32.3 % — ABNORMAL LOW (ref 36.0–46.0)
Hemoglobin: 11 g/dL — ABNORMAL LOW (ref 12.0–15.0)
Immature Granulocytes: 1 %
Lymphocytes Relative: 26 %
Lymphs Abs: 0.6 10*3/uL — ABNORMAL LOW (ref 0.7–4.0)
MCH: 31.1 pg (ref 26.0–34.0)
MCHC: 34.1 g/dL (ref 30.0–36.0)
MCV: 91.2 fL (ref 80.0–100.0)
Monocytes Absolute: 0.5 10*3/uL (ref 0.1–1.0)
Monocytes Relative: 19 %
Neutro Abs: 1.3 10*3/uL — ABNORMAL LOW (ref 1.7–7.7)
Neutrophils Relative %: 52 %
Platelet Count: 36 10*3/uL — ABNORMAL LOW (ref 150–400)
RBC: 3.54 MIL/uL — ABNORMAL LOW (ref 3.87–5.11)
RDW: 21.6 % — ABNORMAL HIGH (ref 11.5–15.5)
WBC Count: 2.5 10*3/uL — ABNORMAL LOW (ref 4.0–10.5)
nRBC: 2.4 % — ABNORMAL HIGH (ref 0.0–0.2)

## 2019-04-08 LAB — SAMPLE TO BLOOD BANK

## 2019-04-08 MED ORDER — POTASSIUM CHLORIDE CRYS ER 20 MEQ PO TBCR: 20.0000 meq | EXTENDED_RELEASE_TABLET | Freq: Two times a day (BID) | ORAL | 2 refills | Status: DC

## 2019-04-08 MED ORDER — MORPHINE SULFATE ER 15 MG PO TBCR: 15.0000 mg | EXTENDED_RELEASE_TABLET | Freq: Two times a day (BID) | ORAL | 0 refills | Status: DC

## 2019-04-08 MED ORDER — IOHEXOL 300 MG/ML  SOLN
100.0000 mL | Freq: Once | INTRAMUSCULAR | Status: AC | PRN
  Administered 2019-04-08: 100 mL via INTRAVENOUS

## 2019-04-08 MED ORDER — SODIUM CHLORIDE (PF) 0.9 % IJ SOLN
INTRAMUSCULAR | Status: AC
  Filled 2019-04-08: qty 50

## 2019-04-08 MED ORDER — POTASSIUM CHLORIDE CRYS ER 20 MEQ PO TBCR: EXTENDED_RELEASE_TABLET | ORAL | 2 refills | Status: DC

## 2019-04-08 NOTE — Telephone Encounter (Signed)
Verbal orders have been given to [place]. She verbalized understanding. Nothing further needed at time of call.

## 2019-04-08 NOTE — Assessment & Plan Note (Signed)
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 Diffuse bone marrow hypermetabolism worrisome for metastatic disease. Prior pericardial effusion resolved.  01/16/2019:Pleural effusion: Thoracentesis revealed metastatic adenocarcinoma breast primary ER 75%, PR 50%, HER-2 -1+ Bone marrow biopsy positive for metastatic breast cancer  [facility] molecular testing: ER/PR positive HER-2 negative, ER positive, TMB low (1), BRCA 1 and 2 -, ESR 1 mutation not detected, PI K3 CA negative Guardant 360: T p53, no evidence of MSI high -------------------------------------------------------------------------------------------------------------------------------------------------------------- Current treatment: Letrozole 2.5 mg daily started 01/15/2019, Ibrance started 01/24/2019, Faslodex and Xgeva added 02/21/2019 Hospitalization: 02/03/19- 02/07/19 Malignant Pleural effusion S/P thoracentesis status post Pleurx catheter placement 03/11/2019  Plan: 1.Faslodex added to the treatment 02/21/2019 2. Ibrance 75 mg 3.Xgeva q 3 months  Severe anemia with symptoms: Hemoglobin is  : 2 units of PRBC previously given.  Severe thrombocytopenia: Platelet count up to    CT scan 04/08/2019

## 2019-04-15 ENCOUNTER — Ambulatory Visit: Payer: 59 | Admitting: Hematology and Oncology

## 2019-04-15 ENCOUNTER — Ambulatory Visit (INDEPENDENT_AMBULATORY_CARE_PROVIDER_SITE_OTHER): Payer: 59 | Admitting: Internal Medicine

## 2019-04-15 ENCOUNTER — Encounter: Payer: Self-pay | Admitting: Internal Medicine

## 2019-04-15 ENCOUNTER — Ambulatory Visit: Payer: 59 | Admitting: Pulmonary Disease

## 2019-04-15 ENCOUNTER — Other Ambulatory Visit: Payer: Self-pay

## 2019-04-15 ENCOUNTER — Telehealth: Payer: Self-pay | Admitting: Hematology and Oncology

## 2019-04-15 ENCOUNTER — Other Ambulatory Visit: Payer: 59

## 2019-04-15 VITALS — BP 108/68 | HR 83 | Temp 98.0°F | Ht 60.0 in | Wt 117.4 lb

## 2019-04-15 DIAGNOSIS — J9611 Chronic respiratory failure with hypoxia: Secondary | ICD-10-CM

## 2019-04-15 DIAGNOSIS — R180 Malignant ascites: Secondary | ICD-10-CM | POA: Diagnosis not present

## 2019-04-15 DIAGNOSIS — J910 Malignant pleural effusion: Secondary | ICD-10-CM

## 2019-04-15 DIAGNOSIS — C50919 Malignant neoplasm of unspecified site of unspecified female breast: Secondary | ICD-10-CM | POA: Diagnosis not present

## 2019-04-15 DIAGNOSIS — C800 Disseminated malignant neoplasm, unspecified: Secondary | ICD-10-CM

## 2019-04-15 NOTE — Telephone Encounter (Signed)
Scheduled per 12/29 los, left a voicemail for patient.

## 2019-04-15 NOTE — Progress Notes (Signed)
Jasmine Allen    CT:[id number]    Oct 13, 1965  Primary Care Physician:Kaplan, Baldemar Friday., PA-C Date of Appointment: 04/16/2019 Established Patient Visit  Chief complaint:   Chief Complaint  Patient presents with  . Follow-up    Patient is here for increased shortness of breath, weakness and lightheaded for the last 3 weeks. Patient states she has heavyness in chest and soreness at drain site. Patient is on 3L oxygen all the time.     HPI: Patient has a history of metastatic breast cancer to the lung and liver.  She is status post Pleurx catheter placement in the beginning of December.  She presents today for follow-up.  Interval Updates:  Husband is with her today.  Ivin Booty is experiencing worsening chest heaviness and an increasing worry that the fluid is building up more quickly.  Draining pleurx every other day 350cc, a little pain and soreness.  Manges limited by pain.  She has since been following up with oncology and there has been some response to her Ibrance.  However her albumin level is low, and she is also having worsening ascites buildup.  No fevers chills night sweats.  She was especially concerned because her oxygen saturation dropped while she was walking, but this did come back up when she sat down to rest.  She is still on 3 L nasal cannula.  I have reviewed the patient's family social and past medical history and updated as appropriate.   Past Medical History:  Diagnosis Date  . Alopecia, unspecified   . Anemia, unspecified    during her pregnancy  . Anxiety state, unspecified   . Breast cancer ([place]) 2019   Right Breast Cancer  . Bulging disc   . Depressive disorder, not elsewhere classified   . Diverticulosis of colon (without mention of hemorrhage)    CT Scan  . Dysrhythmia    PVCs in the past  . Esophageal reflux   . Esophagitis 1998  . Hiatal hernia 1998  . History of kidney stones   . Hypercholesteremia   . Hypertension   . Migraine   .  Opioid abuse, in remission [facility])     Past Surgical History:  Procedure Laterality Date  . ABLATION    . BREAST LUMPECTOMY Right 08/16/2017  . BREAST LUMPECTOMY WITH AXILLARY LYMPH NODE BIOPSY Right 08/16/2017   Procedure: RIGHT BREAST LUMPECTOMY WITH AXILLARY SENTINEL LYMPH NODE BIOPSY;  Surgeon: Rolm Bookbinder, MD;  Location: [facility];  Service: General;  Laterality: Right;  . CARDIAC CATHETERIZATION  2011   normal coronaries  . KNEE SURGERY    . PORTACATH PLACEMENT Right 09/11/2017   Procedure: INSERTION PORT-A-CATH WITH ULTRASOUND;  Surgeon: Rolm Bookbinder, MD;  Location: [facility];  Service: General;  Laterality: Right;  . RE-EXCISION OF BREAST LUMPECTOMY Right 09/11/2017   Procedure: RE-EXCISION OF BREAST LUMPECTOMY;  Surgeon: Rolm Bookbinder, MD;  Location: [place];  Service: General;  Laterality: Right;  . TONSILLECTOMY      Family History  Problem Relation Age of Onset  . Heart disease Father        CABG  . Breast cancer Mother   . Breast cancer Maternal Grandmother   . Breast cancer Cousin   . Colon cancer Neg Hx   . Stomach cancer Neg Hx     Social History   Occupational History  . Not on file  Tobacco Use  . Smoking status: Former Smoker  Packs/day: 0.25    Quit date: 08/25/2016    Years since quitting: 2.6  . Smokeless tobacco: Never Used  Substance and Sexual Activity  . Alcohol use: Yes    Comment: "once a week"  . Drug use: No  . Sexual activity: Yes    Birth control/protection: None, Post-menopausal    Review of systems: Constitutional: No fevers, chills, night sweats, or weight loss. CV: Chest heaviness Resp: Shortness of breath  Physical Exam: Blood pressure 108/68, pulse 83, temperature 98 F (36.7 C), temperature source Temporal, height 5' (1.524 m), weight 117 lb 6.4 oz (53.3 kg), last menstrual period 12/24/2010, SpO2 95 %.  Gen:      Chronically ill-appearing, visibly distraught. Lungs:    Decreased  breath sounds in the right lung base, bilateral upper lung fields with good aeration.  The right Pleurx site appears clean and dry, no evidence of infection. CV:         Regular rate and rhythm; no murmurs, rubs, or gallops.  No pedal edema Abd:      Distended with ascites, soft.  Data Reviewed: Imaging: I have personally reviewed the CT chest and abdomen pelvis that was done on 12/29.    Labs:  Immunization status: Immunization History  Administered Date(s) Administered  . Hep A / Hep B 06/17/2018  . Influenza-Unspecified 01/10/2010, 02/09/2011, 01/30/2012, 02/03/2013  . Tdap 07/22/2011   Assessment:  Metastatic breast cancer with lymphangitic spread and malignant pleural effusion and hepatic metastases. 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  Since her pleural effusion drainage has not been reducing over the last month, I think it is very likely that she has worsening ascites communicating through her diaphragmatic medication that is not being drained through her pleural space.  I think that pleurodesis is now very unlikely.  Her hypoxemia is most likely secondary to the lymphangitic spread, rather than the effusion itself.  Her worsening symptoms may be secondary to lung entrapment, as well as persistence of effusions and now worsening ascites causing reduced FRC thus shallow inspiration.  Unfortunately there is not a good procedural treatment option for this problem.  I would not recommend additional drainage of ascites through an abdominal Pleurx.  It is very likely that this ascites is being drained already through the Pleurx in her space.  If the symptoms of effusion and ascites are worsening, a consult to palliative care for symptom management may be appropriate.  I spent 38  minutes on 04/16/2019 in care of this patient including face to face time and non-face to face time spent charting, review of outside records, and coordination of care.   Return to Care: Follow-up with Dr. Tamala Julian as scheduled later this month.  Lenice Llamas, MD Pulmonary and [place]

## 2019-04-16 ENCOUNTER — Encounter: Payer: Self-pay | Admitting: Internal Medicine

## 2019-04-18 ENCOUNTER — Other Ambulatory Visit: Payer: Self-pay | Admitting: Hematology and Oncology

## 2019-04-20 NOTE — Progress Notes (Signed)
Patient Care Team: Aletha Halim., PA-C as PCP - General (Family Medicine) Nicholas Lose, MD as Consulting Physician (Hematology and Oncology) Kyung Rudd, MD as Consulting Physician (Radiation Oncology) Rolm Bookbinder, MD as Consulting Physician (General Surgery)  DIAGNOSIS:    ICD-10-CM   1. Metastatic breast cancer ([place])  C50.919   2. Malignant neoplasm of lower-inner quadrant of right breast of female, estrogen receptor positive ([place])  C50.311    Z17.0     SUMMARY OF ONCOLOGIC HISTORY: Oncology History  Malignant neoplasm of lower-inner quadrant of right breast of female, estrogen receptor positive ([place])  08/16/2017 Initial Diagnosis   Right lumpectomy: 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  High risk   08/16/2017 Cancer Staging   Staging form: Breast, AJCC 8th Edition - Pathologic stage from 08/16/2017: Stage IA (pT1c, pN0, cM0, G2, ER+, PR+, HER2-, Oncotype DX score: 31) - Signed by Gardenia Phlegm, NP on 09/12/2018   09/25/2017 - 02/05/2018 Chemotherapy   Adjuvant chemotherapy with dose dense Adriamycin and Cytoxan x4 followed by Taxol weekly x12    03/11/2018 - 04/25/2018 Radiation Therapy   Adjuvant XRT   01/09/2019 PET scan   Pulmonary hypermetabolic and consistent with lymphangitic tumor spread, confluent right middle lobe consolidation, hypermetabolic abdominal, cervical and left axillary lymph nodes, diffuse liver hypermetabolic some nonspecific.  Diffuse bone marrow hypermetabolism worrisome for metastatic disease.  Prior pericardial effusion resolved.   01/16/2019 Relapse/Recurrence   Pleural effusion: Thoracentesis revealed metastatic adenocarcinoma breast primary ER 75%, PR 50%, HER-2 -1+ Bone marrow biopsy positive  for metastatic breast cancer     CHIEF COMPLIANT: Follow-up of metastatic breast cancer  INTERVAL HISTORY: Jasmine Allen is a 54 y.o. 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 She is currently on treatment with letrozole,Ibrance, and Faslodex. She presents to the clinic todayfor follow-up and a toxicity check. She continues to have chest related symptoms and saw pulmonary.  They recommended maintaining the Pleurx catheter because she is still draining 300 mL every other day.  She feels short of breath even to minimal exertion and wants to know if blood transfusion can help her.  She is using 24 x 7 oxygen in spite of that she feels short of breath.  She is also very concerned about her severe fatigue.  She attributes a significant portion of the fatigue to [place].  ALLERGIES:  is allergic to amoxicillin-pot clavulanate; erythromycin; metoclopramide hcl; and sulfonamide derivatives.  MEDICATIONS:  Current Outpatient Medications  Medication Sig Dispense Refill  . albuterol (PROVENTIL) (2.5 MG/3ML) 0.083% nebulizer solution Inhale 3 mLs into the lungs every 6 (six) hours as needed for wheezing or shortness of breath (cough). 360 mL 0  . ALPRAZolam (XANAX) 0.25 MG tablet Take 1 tablet (0.25 mg total) by mouth at bedtime as needed for anxiety. 30 tablet 3  . atorvastatin (LIPITOR) 20 MG tablet Take 20 mg by mouth at bedtime.     . Biotin 10000 MCG TABS Take 10,000 mcg by mouth daily.     . bumetanide (BUMEX) 1 MG tablet TAKE 1 TABLET(1 MG) BY MOUTH TWICE DAILY 60 tablet 2  . cetirizine (ZYRTEC) 10 MG tablet Take 10 mg by mouth daily.    Marland Kitchen  chlorpheniramine-HYDROcodone (TUSSIONEX PENNKINETIC ER) 10-8 MG/5ML SUER Take 5 mLs by mouth 2 (two) times daily. 140 mL 0  . cyclobenzaprine (FLEXERIL) 10 MG tablet Take 10 mg by mouth every 8 (eight) hours as needed for muscle spasms.  1  . diphenoxylate-atropine (LOMOTIL) 2.5-0.025 MG  tablet Take 1 tablet by mouth 4 (four) times daily as needed for diarrhea or loose stools. 30 tablet 1  . hydrOXYzine (ATARAX/VISTARIL) 10 MG tablet Take 10-30 mg by mouth at bedtime as needed for itching.   0  . hyoscyamine (LEVSIN SL) 0.125 MG SL tablet dissolve 1 tablet under the tongue every 4 hours if needed (Patient taking differently: Take 0.125 mg by mouth every 4 (four) hours as needed for cramping. ) 30 tablet 0  . letrozole (FEMARA) 2.5 MG tablet Take 1 tablet (2.5 mg total) by mouth daily. 90 tablet 3  . LORazepam (ATIVAN) 0.5 MG tablet Take 1 tablet (0.5 mg total) by mouth every 8 (eight) hours. 30 tablet 3  . morphine (MS CONTIN) 15 MG 12 hr tablet Take 1 tablet (15 mg total) by mouth every 12 (twelve) hours. 60 tablet 0  . morphine (MSIR) 15 MG tablet Take 1 tablet (15 mg total) by mouth every 6 (six) hours as needed for severe pain. 60 tablet 0  . Multiple Vitamin (MULITIVITAMIN WITH MINERALS) TABS Take 1 tablet by mouth daily.    . palbociclib (IBRANCE) 75 MG tablet Take 75 mg by mouth daily. Take for 21 days on, 7 days off, repeat every 28 days.    . pantoprazole (PROTONIX) 40 MG tablet Take 1 tablet (40 mg total) by mouth 2 (two) times daily before a meal. (Patient taking differently: Take 40 mg by mouth 2 (two) times daily. ) 60 tablet 6  . polyethylene glycol (MIRALAX / GLYCOLAX) 17 g packet Take 17 g by mouth 2 (two) times daily. Decrease to daily if having watery or multiple loose stools daily. 30 each 0  . potassium chloride SA (KLOR-CON) 20 MEQ tablet Take 2 tablets (40 mEq total) by mouth daily. TAKE 1 TABLET(20 MEQ) BY MOUTH DAILY 60 tablet 6  . senna (SENOKOT) 8.6 MG tablet Take by mouth.    . traZODone (DESYREL) 100 MG tablet Take 150 mg by mouth at bedtime.     . valACYclovir (VALTREX) 1000 MG tablet Take by mouth.     No current facility-administered medications for this visit.    PHYSICAL EXAMINATION: ECOG PERFORMANCE STATUS: 3 - Symptomatic, >50% confined to  bed  Vitals:   04/21/19 1118  BP: 125/89  Pulse: 81  Resp: 18  Temp: 98.3 F (36.8 C)  SpO2: 99%   Filed Weights   04/21/19 1118  Weight: 117 lb 4.8 oz (53.2 kg)    LABORATORY DATA:  I have reviewed the data as listed CMP Latest Ref Rng & Units 04/21/2019 04/08/2019 03/21/2019  Glucose 70 - 99 mg/dL 206(H) 128(H) 300(H)  BUN 6 - 20 mg/dL 13 5(L) 8  Creatinine 0.44 - 1.00 mg/dL 0.66 0.61 0.66  Sodium 135 - 145 mmol/L 137 137 136  Potassium 3.5 - 5.1 mmol/L 3.9 3.1(L) 3.4(L)  Chloride 98 - 111 mmol/L 105 101 101  CO2 22 - 32 mmol/L _0 Calcium 8.9 - 10.3 mg/dL 7.4(L) 7.5(L) 7.3(L)  Total Protein 6.5 - 8.1 g/dL 5.4(L) 5.5(L) 5.2(L)  Total Bilirubin 0.3 - 1.2 mg/dL 1.0 1.6(H) 1.1  Alkaline Phos 38 - 126 U/L 188(H) 213(H) 261(H)  AST 15 - 41  U/L 66(H) 67(H) 57(H)  ALT 0 - 44 U/L 32 35 33    Lab Results  Component Value Date   WBC 1.9 (L) 04/21/2019   HGB 10.0 (L) 04/21/2019   HCT 29.6 (L) 04/21/2019   MCV 97.4 04/21/2019   PLT 40 (L) 04/21/2019   NEUTROABS 1.1 (L) 04/21/2019    ASSESSMENT & PLAN:  Malignant neoplasm of lower-inner quadrant of right breast of female, estrogen receptor positive ([place]) 08/16/2017:Right lumpectomy: Grade 2 IDC 1.5 cm, with DCIS and necrosis, 0/3 lymph nodes negative, ER 95%, PR 30%, HER-2 negative ratio 1.14, Ki-67 40%, T1CN0 stage Ia; resection of the margin 09/11/2017: Benign  01/09/2019: PET CT scan:Pulmonary hypermetabolic and consistent with lymphangitic tumor spread, confluent right middle lobe consolidation, hypermetabolic abdominal, cervical and left axillary lymph nodes, diffuse liver hypermetabolic some nonspecific. Diffuse bone marrow hypermetabolism worrisome for metastatic disease. Prior pericardial effusion resolved.  01/16/2019:Pleural effusion: Thoracentesis revealed metastatic adenocarcinoma breast primary ER 75%, PR 50%, HER-2 -1+ Bone marrow biopsy positive for metastatic breast cancer  [facility] molecular testing: ER/PR  positive HER-2 negative, ER positive, TMB low (1), BRCA 1 and 2 -, ESR 1 mutation not detected, PI K3 CA negative Guardant 360: T p53, no evidence of MSI high -------------------------------------------------------------------------------------------------------------------------------------------------------------- Current treatment: Letrozole 2.5 mg daily started 01/15/2019, Ibrance started 01/24/2019, Faslodex and Xgeva added 02/21/2019 Hospitalization: 02/03/19- 02/07/19 Malignant Pleural effusion S/P thoracentesisstatus post Pleurx catheter placement 03/11/2019  Plan: 1.Faslodex added to the treatment 02/21/2019 2. Ibrance 75 mg 3.Xgeva q 3 months  Severe anemia: Hemoglobin is  10: Does not require blood transfusion Severe thrombocytopenia: Platelet count is 40 Severe fatigue: Emotional and physical factors are playing a role in this.  However Leslee Home is also very possible culprit.  Therefore we decided to change her treatment plan to be 3 weeks on and 2 weeks off. We discussed other options including 2 weeks on 1 week off but patient preferred to have 3 weeks on and 2 weeks off.  CT scan 04/08/2019: Positive response to treatment with regression of the lung changes and stable liver and bone lesions.  Return to clinic in 4 weeks with labs and follow-up    No orders of the defined types were placed in this encounter.  The patient has a good understanding of the overall plan. she agrees with it. she will call with any problems that may develop before the next visit here.  Total time spent: 30 mins including face to face time and time spent for planning, charting and coordination of care  Nicholas Lose, MD 04/21/2019  I, Cloyde Reams Dorshimer, am acting as scribe for Dr. Nicholas Lose.  I have reviewed the above documentation for accuracy and completeness, and I agree with the above.

## 2019-04-21 ENCOUNTER — Ambulatory Visit: Payer: 59

## 2019-04-21 ENCOUNTER — Telehealth: Payer: Self-pay | Admitting: Hematology and Oncology

## 2019-04-21 ENCOUNTER — Other Ambulatory Visit: Payer: Self-pay

## 2019-04-21 ENCOUNTER — Inpatient Hospital Stay (HOSPITAL_BASED_OUTPATIENT_CLINIC_OR_DEPARTMENT_OTHER): Payer: 59 | Admitting: Hematology and Oncology

## 2019-04-21 ENCOUNTER — Inpatient Hospital Stay: Payer: 59 | Attending: Adult Health

## 2019-04-21 ENCOUNTER — Inpatient Hospital Stay: Payer: 59

## 2019-04-21 VITALS — BP 125/89 | HR 81 | Temp 98.3°F | Resp 18 | Ht 60.0 in | Wt 117.3 lb

## 2019-04-21 DIAGNOSIS — C7951 Secondary malignant neoplasm of bone: Secondary | ICD-10-CM | POA: Diagnosis present

## 2019-04-21 DIAGNOSIS — R5383 Other fatigue: Secondary | ICD-10-CM | POA: Insufficient documentation

## 2019-04-21 DIAGNOSIS — C50919 Malignant neoplasm of unspecified site of unspecified female breast: Secondary | ICD-10-CM | POA: Diagnosis not present

## 2019-04-21 DIAGNOSIS — D649 Anemia, unspecified: Secondary | ICD-10-CM | POA: Diagnosis not present

## 2019-04-21 DIAGNOSIS — Z170 Estrogen receptor positive status [ER+]: Secondary | ICD-10-CM

## 2019-04-21 DIAGNOSIS — Z79811 Long term (current) use of aromatase inhibitors: Secondary | ICD-10-CM | POA: Insufficient documentation

## 2019-04-21 DIAGNOSIS — Z923 Personal history of irradiation: Secondary | ICD-10-CM | POA: Diagnosis not present

## 2019-04-21 DIAGNOSIS — C50311 Malignant neoplasm of lower-inner quadrant of right female breast: Secondary | ICD-10-CM | POA: Diagnosis not present

## 2019-04-21 DIAGNOSIS — C7800 Secondary malignant neoplasm of unspecified lung: Secondary | ICD-10-CM | POA: Insufficient documentation

## 2019-04-21 DIAGNOSIS — Z95828 Presence of other vascular implants and grafts: Secondary | ICD-10-CM

## 2019-04-21 DIAGNOSIS — Z9221 Personal history of antineoplastic chemotherapy: Secondary | ICD-10-CM | POA: Diagnosis not present

## 2019-04-21 DIAGNOSIS — D696 Thrombocytopenia, unspecified: Secondary | ICD-10-CM | POA: Diagnosis not present

## 2019-04-21 DIAGNOSIS — Z79899 Other long term (current) drug therapy: Secondary | ICD-10-CM | POA: Insufficient documentation

## 2019-04-21 LAB — CBC WITH DIFFERENTIAL (CANCER CENTER ONLY)
Abs Immature Granulocytes: 0 10*3/uL (ref 0.00–0.07)
Basophils Absolute: 0 10*3/uL (ref 0.0–0.1)
Basophils Relative: 1 %
Eosinophils Absolute: 0 10*3/uL (ref 0.0–0.5)
Eosinophils Relative: 1 %
HCT: 29.6 % — ABNORMAL LOW (ref 36.0–46.0)
Hemoglobin: 10 g/dL — ABNORMAL LOW (ref 12.0–15.0)
Immature Granulocytes: 0 %
Lymphocytes Relative: 32 %
Lymphs Abs: 0.6 10*3/uL — ABNORMAL LOW (ref 0.7–4.0)
MCH: 32.9 pg (ref 26.0–34.0)
MCHC: 33.8 g/dL (ref 30.0–36.0)
MCV: 97.4 fL (ref 80.0–100.0)
Monocytes Absolute: 0.1 10*3/uL (ref 0.1–1.0)
Monocytes Relative: 6 %
Neutro Abs: 1.1 10*3/uL — ABNORMAL LOW (ref 1.7–7.7)
Neutrophils Relative %: 60 %
Platelet Count: 40 10*3/uL — ABNORMAL LOW (ref 150–400)
RBC: 3.04 MIL/uL — ABNORMAL LOW (ref 3.87–5.11)
RDW: 22.8 % — ABNORMAL HIGH (ref 11.5–15.5)
WBC Count: 1.9 10*3/uL — ABNORMAL LOW (ref 4.0–10.5)
nRBC: 2.7 % — ABNORMAL HIGH (ref 0.0–0.2)

## 2019-04-21 LAB — CMP (CANCER CENTER ONLY)
ALT: 32 U/L (ref 0–44)
AST: 66 U/L — ABNORMAL HIGH (ref 15–41)
Albumin: 2.6 g/dL — ABNORMAL LOW (ref 3.5–5.0)
Alkaline Phosphatase: 188 U/L — ABNORMAL HIGH (ref 38–126)
Anion gap: 7 (ref 5–15)
BUN: 13 mg/dL (ref 6–20)
CO2: 25 mmol/L (ref 22–32)
Calcium: 7.4 mg/dL — ABNORMAL LOW (ref 8.9–10.3)
Chloride: 105 mmol/L (ref 98–111)
Creatinine: 0.66 mg/dL (ref 0.44–1.00)
GFR, Est AFR Am: >60 mL/min (ref >60)
GFR, Estimated: >60 mL/min (ref >60)
Glucose, Bld: 206 mg/dL — ABNORMAL HIGH (ref 70–99)
Potassium: 3.9 mmol/L (ref 3.5–5.1)
Sodium: 137 mmol/L (ref 135–145)
Total Bilirubin: 1 mg/dL (ref 0.3–1.2)
Total Protein: 5.4 g/dL — ABNORMAL LOW (ref 6.5–8.1)

## 2019-04-21 LAB — SAMPLE TO BLOOD BANK

## 2019-04-21 MED ORDER — POTASSIUM CHLORIDE CRYS ER 20 MEQ PO TBCR: 40.0000 meq | EXTENDED_RELEASE_TABLET | Freq: Every day | ORAL | 6 refills | Status: AC

## 2019-04-21 MED ORDER — FULVESTRANT 250 MG/5ML IM SOLN
INTRAMUSCULAR | Status: AC
  Filled 2019-04-21: qty 10

## 2019-04-21 MED ORDER — FULVESTRANT 250 MG/5ML IM SOLN
500.0000 mg | Freq: Once | INTRAMUSCULAR | Status: AC
  Administered 2019-04-21: 500 mg via INTRAMUSCULAR

## 2019-04-21 NOTE — Patient Instructions (Signed)
Fulvestrant injection What is this medicine? FULVESTRANT (ful VES trant) blocks the effects of estrogen. It is used to treat breast cancer. This medicine may be used for other purposes; ask your health care provider or pharmacist if you have questions. COMMON BRAND NAME(S): FASLODEX What should I tell my health care provider before I take this medicine? They need to know if you have any of these conditions:  bleeding disorders  liver disease  low blood counts, like low white cell, platelet, or red cell counts  an unusual or allergic reaction to fulvestrant, other medicines, foods, dyes, or preservatives  pregnant or trying to get pregnant  breast-feeding How should I use this medicine? This medicine is for injection into a muscle. It is usually given by a health care professional in a hospital or clinic setting. Talk to your pediatrician regarding the use of this medicine in children. Special care may be needed. Overdosage: If you think you have taken too much of this medicine contact a poison control center or emergency room at once. NOTE: This medicine is only for you. Do not share this medicine with others. What if I miss a dose? It is important not to miss your dose. Call your doctor or health care professional if you are unable to keep an appointment. What may interact with this medicine?  medicines that treat or prevent blood clots like warfarin, enoxaparin, dalteparin, apixaban, dabigatran, and rivaroxaban This list may not describe all possible interactions. Give your health care provider a list of all the medicines, herbs, non-prescription drugs, or dietary supplements you use. Also tell them if you smoke, drink alcohol, or use illegal drugs. Some items may interact with your medicine. What should I watch for while using this medicine? Your condition will be monitored carefully while you are receiving this medicine. You will need important blood work done while you are taking  this medicine. Do not become pregnant while taking this medicine or for at least 1 year after stopping it. Women of child-bearing potential will need to have a negative pregnancy test before starting this medicine. Women should inform their doctor if they wish to become pregnant or think they might be pregnant. There is a potential for serious side effects to an unborn child. Men should inform their doctors if they wish to father a child. This medicine may lower sperm counts. Talk to your health care professional or pharmacist for more information. Do not breast-feed an infant while taking this medicine or for 1 year after the last dose. What side effects may I notice from receiving this medicine? Side effects that you should report to your doctor or health care professional as soon as possible:  allergic reactions like skin rash, itching or hives, swelling of the face, lips, or tongue  feeling faint or lightheaded, falls  pain, tingling, numbness, or weakness in the legs  signs and symptoms of infection like fever or chills; cough; flu-like symptoms; sore throat  vaginal bleeding Side effects that usually do not require medical attention (report to your doctor or health care professional if they continue or are bothersome):  aches, pains  constipation  diarrhea  headache  hot flashes  nausea, vomiting  pain at site where injected  stomach pain This list may not describe all possible side effects. Call your doctor for medical advice about side effects. You may report side effects to FDA at 1-800-FDA-1088. Where should I keep my medicine? This drug is given in a hospital or clinic and will   not be stored at home. NOTE: This sheet is a summary. It may not cover all possible information. If you have questions about this medicine, talk to your doctor, pharmacist, or health care provider.  2020 Elsevier/Gold Standard (2017-07-05 11:34:41)  

## 2019-04-21 NOTE — Telephone Encounter (Signed)
I talk with patient regarding schedule  

## 2019-04-21 NOTE — Assessment & Plan Note (Signed)
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 Diffuse bone marrow hypermetabolism worrisome for metastatic disease. Prior pericardial effusion resolved.  01/16/2019:Pleural effusion: Thoracentesis revealed metastatic adenocarcinoma breast primary ER 75%, PR 50%, HER-2 -1+ Bone marrow biopsy positive for metastatic breast cancer  [facility] molecular testing: ER/PR positive HER-2 negative, ER positive, TMB low (1), BRCA 1 and 2 -, ESR 1 mutation not detected, PI K3 CA negative Guardant 360: T p53, no evidence of MSI high -------------------------------------------------------------------------------------------------------------------------------------------------------------- Current treatment: Letrozole 2.5 mg daily started 01/15/2019, Ibrance started 01/24/2019, Faslodex and Xgeva added 02/21/2019 Hospitalization: 02/03/19- 02/07/19 Malignant Pleural effusion S/P thoracentesisstatus post Pleurx catheter placement 03/11/2019  Plan: 1.Faslodex added to the treatment 02/21/2019 2. Ibrance 75 mg 3.Xgeva q 3 months  Severe anemia: Hemoglobin is   Severe thrombocytopenia: Platelet count Severe fatigue: Emotional and physical factors are playing a role in this.  CT scan 04/08/2019: Positive response to treatment with regression of the lung changes and stable liver and bone lesions.  Return to clinic in 4 weeks with labs and follow-up 

## 2019-04-29 ENCOUNTER — Encounter: Payer: Self-pay | Admitting: Internal Medicine

## 2019-04-29 ENCOUNTER — Other Ambulatory Visit: Payer: Self-pay

## 2019-04-29 ENCOUNTER — Ambulatory Visit (INDEPENDENT_AMBULATORY_CARE_PROVIDER_SITE_OTHER): Payer: 59 | Admitting: Internal Medicine

## 2019-04-29 ENCOUNTER — Other Ambulatory Visit (HOSPITAL_COMMUNITY)
Admission: RE | Admit: 2019-04-29 | Discharge: 2019-04-29 | Disposition: A | Discharge status: Home / Self Care | Payer: 59 | Source: Ambulatory Visit | Attending: Internal Medicine | Admitting: Internal Medicine

## 2019-04-29 VITALS — BP 130/80 | HR 84 | Temp 98.2°F | Ht 60.0 in | Wt 118.6 lb

## 2019-04-29 DIAGNOSIS — R188 Other ascites: Secondary | ICD-10-CM | POA: Diagnosis not present

## 2019-04-29 DIAGNOSIS — J90 Pleural effusion, not elsewhere classified: Secondary | ICD-10-CM | POA: Diagnosis present

## 2019-04-29 NOTE — Progress Notes (Signed)
Pulmonary Office Followup Note  Seen in f/u for malignant effusion  S: 54 year old woman with metastatic breast cancer with mets to bone, lungs, and R pleural space with recurrent malignant effusion.   S/P pleurX 03/11/19 Draining 300cc MWF to good effect Becoming more anasarcic and edemtatous despite bumex Nutrition sounds like it may be an issue SOB also worsening Fatigued Sleeps a lot during day   O: Today's Vitals   04/29/19 1341  BP: 130/80  Pulse: 84  Temp: 98.2 F (36.8 C)  TempSrc: Temporal  SpO2: 94%  Weight: 118 lb 9.6 oz (53.8 kg)  Height: 5' (1.524 m)   Body mass index is 23.16 kg/m. On 3L  GEN: middle aged woman in NAD HEENT: no thrush, RRR CV: RRR, ext warm PULM: Wheezing R base, pleurX dressing in place, diminished L base with moderate effusion on [place] GI: Soft, +BS, ascites on [place] EXT: 1+ LE edema NEURO: Moves all 4 ext to command PSYCH: AOx3, excellent insight  SKIN: shows me a photo of her site from today, looks great  CT Chest with + response to chemo, R effusion with pleurX in place, L effusion persists  A: # Recurrent malignant R effusion s/p PleurX 12/1 with entrapment by symptoms # Metastatic breast cancer on letrozole,Ibrance, and faslodex # Pain due to bone and nerve infiltration of tumor # Chemo induced pancytopenia # Exertional dyspnea getting worse despite improved appearance of CT: I think this is related to developing protein calorie malnutrition and deconditioning leading to poor oncotic pressure and third spacing, given her history we decided to drain the left effusion and some ascites to both help breathing and assure no cancer involvement   P: - Drain MWF until starts feeling entrapment/ pain, monitor output - Drain left side today, told patient to call tomorrow and let us know if (A) feels better with drainage, we will need to discuss pleurX and if (B) still very SOB should probably get CTA chest r/o PE - Drain ascites today -  Send both ascites and left pleural fluid for cytology - Importance of nutrition and activity discussed - f/u in 1 mo or sooner if feeling unwell  Erskine Emery MD

## 2019-04-29 NOTE — Progress Notes (Signed)
After discussion of risks and benefits of both procedures, patient agreed to proceed.  Thoracentesis w/ [place] Note Timeout performed Left chest examined with [place] and skin overlying fluid pocket marked Area prepped and anesthesized with 1% lidocaine 500  cc straw  fluid removed Bandaid applied to site Postprocedural [place] with good lung sliding- no need for CXR No immediate complications   Paracentesis w/ [place] Note Timeout performed Abdomen examined with [place] and skin overlying fluid pocket marked Area prepped and anesthesized with 1% lidocaine 600  cc straw  fluid removed Bandaid applied to site No immediate complications

## 2019-04-29 NOTE — Patient Instructions (Signed)
Call tomorrow regarding breathing status Increase protein intake Continue pleurX drainage per schedule See me in 1 month, sooner if unwelll

## 2019-04-30 ENCOUNTER — Telehealth: Payer: Self-pay | Admitting: Internal Medicine

## 2019-04-30 LAB — PROTEIN, BODY FLUID (OTHER): Protein, Fluid: 1.6 g/dL

## 2019-04-30 NOTE — Telephone Encounter (Signed)
Patient called in to make Dr. Tamala Julian aware that she's doing well after her procedure in office yesterday. She states that she does feel some better and she's not having any problems with her site.

## 2019-05-01 ENCOUNTER — Other Ambulatory Visit: Payer: 59 | Admitting: Internal Medicine

## 2019-05-01 LAB — CYTOLOGY - NON PAP

## 2019-05-02 ENCOUNTER — Ambulatory Visit: Payer: 59

## 2019-05-02 ENCOUNTER — Other Ambulatory Visit: Payer: Self-pay | Admitting: *Deleted

## 2019-05-02 LAB — LACTATE DEHYDROGENASE(LD), BODY FLUID: LACTATE DEHYDROGENASE(LD)BODY FLUID: 151 U/L

## 2019-05-02 LAB — CELL COUNT AND DIFFERENTIAL, PLEURAL FLUID
Basophils, %: 0 %
Eosinophils, %: 0 %
Lymphocytes, %: 60 %
Mesothelial, %: 8 %
Monocyte/Macrophage %: 12 %
Neutrophils, %: 20 %
Total Nucleated Cell Ct: 91 cells/uL

## 2019-05-02 MED ORDER — VENLAFAXINE HCL ER 37.5 MG PO CP24: 37.5000 mg | ORAL_CAPSULE | Freq: Every day | ORAL | 0 refills | Status: DC

## 2019-05-02 NOTE — Progress Notes (Signed)
Received call from pt requesting a long acting anti depressant.  Pt states the recent diagnosis of metastatic cancer is really hard on her emotionally. Per MD pt to start Effexor-XR 37.5 mg daily and increase to 75 mg XR daily in two weeks if needed.  RN educated pt on taking medication every day and possible side effects pt may experience at first.  Pt verbalized understanding and appreciative of the advice and help.

## 2019-05-06 ENCOUNTER — Other Ambulatory Visit: Payer: 59

## 2019-05-07 ENCOUNTER — Telehealth: Payer: Self-pay | Admitting: *Deleted

## 2019-05-07 NOTE — Telephone Encounter (Signed)
Received call from pt stating she has been on Effexor 37.5 mg p.o x6 days and has developed an increase in her baseline tremors that include rigid jerking at times. Pt states she has experienced these symptoms in the past when on the antidepressant Celexa.  Pt states she has had success with Zoloft in the past and is requesting a prescription be sent in.  Per MD pt to stop taking Effexor x1 week to see if symptoms resolve.RN instructed pt to call the office if symptoms become worse.  Pt verbalized understanding.

## 2019-05-08 ENCOUNTER — Other Ambulatory Visit: Payer: Self-pay | Admitting: Hematology and Oncology

## 2019-05-08 ENCOUNTER — Other Ambulatory Visit: Payer: Self-pay | Admitting: *Deleted

## 2019-05-08 MED ORDER — MORPHINE SULFATE ER 15 MG PO TBCR: 15.0000 mg | EXTENDED_RELEASE_TABLET | Freq: Two times a day (BID) | ORAL | 0 refills | Status: DC

## 2019-05-08 MED ORDER — PROCHLORPERAZINE MALEATE 10 MG PO TABS: 10.0000 mg | ORAL_TABLET | Freq: Four times a day (QID) | ORAL | 0 refills | Status: AC | PRN

## 2019-05-08 MED ORDER — ONDANSETRON 4 MG PO TBDP: 4.0000 mg | ORAL_TABLET | Freq: Three times a day (TID) | ORAL | 0 refills | Status: DC | PRN

## 2019-05-08 MED ORDER — DEXAMETHASONE 1 MG PO TABS: 1.0000 mg | ORAL_TABLET | Freq: Every day | ORAL | 0 refills | Status: DC

## 2019-05-08 NOTE — Progress Notes (Signed)
Received call from pt and spouse stating pt has no appetite and decreased oral intake.  States pt drinks 2 ensures and water a day.  Requesting an appetite stimulant.  Per MD pt to receive 1 mg daily p.o decadron to help increase appetite.   RN messaged nutritionist at [facility] to reach out to pt for extra support as well.  RN educated pt to contact the office if she starts to experience signs of dehydration. Pt and husband verbalized understanding.

## 2019-05-08 NOTE — Telephone Encounter (Signed)
Received call from pt stating she is experiencing nausea on and off x2 days.  Pt denies vomiting and states her pain medication makes her nauseated at times.  Pt requesting refill on antiemetic medication stating alternating between Zofran and compazine in the past has helped.  Per MD ok for pt to refill compazine 10 mg p.o and to order Zofran 4 mg disintegrating tablet. Pt verbalized understanding.

## 2019-05-09 ENCOUNTER — Other Ambulatory Visit: Payer: Self-pay

## 2019-05-09 ENCOUNTER — Other Ambulatory Visit: Payer: 59 | Admitting: Internal Medicine

## 2019-05-09 DIAGNOSIS — Z515 Encounter for palliative care: Secondary | ICD-10-CM

## 2019-05-09 DIAGNOSIS — Z7189 Other specified counseling: Secondary | ICD-10-CM

## 2019-05-09 NOTE — Progress Notes (Signed)
Jan 29th, 2021 [facility] Palliative Care Consult Note Telephone: [phone]  Fax: [phone]  PATIENT NAME: Jasmine Allen DOB: 22-May-1965 MRN: [id number]  PRIMARY CARE PROVIDER:   Nicholas Lose, MD (LOV 04/21/19)  Dr. Candee Furbish [facility])  REFERRING PROVIDER:  Aletha Halim., PA-C [address],   [address]  RESPONSIBLE PARTY:  Spouse  ASSESSMENT / RECOMMENDATIONS:  1. Advance Care Planning:  A. Directives: In the presence of her spouse (who wants to honor her wishes), patient states she would NOT want CPR in the event of a Cardiopulmonary Arrest. I completed 2 DNR forms and left in the home. Advised patient to keep one copy on her fridge, where EMS would look for it if they were summoned and carry the other copy with her should she travel outside the home. We reviewed aspects of the MOST form. Patient wishes to sign at a future date, after she and her husband have had more time to review. At a glance, wishing DNR/DNI, Limited Scope of Medical Interventions, yes to IVFs and Antibiotics. No to Tube Feeding. Plan on completing this form next visit.  B. Goals of Care: Hoping to have R chest PlurX tube removed. She is concerned that the drainage fluid contains essential nutrients that are draining from her body and contributing to her weight loss. She's hoping to "buy some time" to achieve that goal.   2. Symptom Management:  A. Fatigue d/t illness/deconditioning/opioids: Complains of excessive somnolence with use of prn MSO4 IR 15mg . Sleeps a lot during the day. Able to ambulate without use of assistive devices. No falls.    -can try cutting prn MSO4 in half; see if adequate pain control with less somnolence/confusion.  B. Decreased appetite of illness: Current weight 118 lbs. At a height of 5' her BMI 23.16 kg/m2. Recently started Decadron 1mg  qd  (Dr. Lindi Adie). Supplemental ensure. Consumes about 50% of meals.  Nausea/malaise yesterday managed with Compazine; no emesis.    -Consider Remeron 15mg  qhs   C. Pain: Described as dull, moderately severe, concentrated R flank, but also occasional anterior chest and R upper abdominal under R rib. Managed with MS Contin 15mg  bid, with MSO4 IR 15mg  bid q 6 hr prn breakthrough pain. Resists taking MSO4 IR d/t side effect confusion/somnolence. States never pain free, but adequate control so that she can so the things she needs to accomplish.    -could try cutting MSO4 IR in half and augmenting with Tylenol 500mg  q6 prn to mitigate effects somnolence/confusion.  D. Opioid induced constipation: Her current regimen is taking a Senokot q3d if no BM. She doesn't want to alter this regimen.   E. Dyspnea form lung cancer/malignant pleural effusion: R PleurX CT present since 03/12/2019, draining decreasing amounts fluid (about 200-300 q2-3 d). She's really hoping this can be discontinued soon. She's had a total of 7 past thoracenteses.  Recent L Thoracentesis  04/29/2019 of 582ml. She has oxygen 24/7 but can tolerate ambulating without it for short distances. Feels her dyspnea is progressing. She had LE edema 1+ managed with Bumex 1mg  bid  F. Medication simplification: consider discontinuance of Lipitor  G. Coping setting of life-threatening illness/situational and prior h/o depression: Patient had to recently discontinue her Effexor d/t SE UE tremors. Prior use of Celexa caused similar symptoms. Past use of Zoloft better tolerated. She talks openly of her distress regarding limited prognosis. Her 2 children (ages 39 and 74) are aware patient has a  terminal illness, but Ivin Booty hasn't spoken openly to them. We discussed referral to [facility] for the kids; Ivin Booty is open to having a counselor form that source call her and discuss the program. Her husband is super supportive, and they share their hopes and concerns. They are trying to maintain a normal routine for the kids. Patient copes by  talking with her close girlfriend network. She has a best / childhood friend [name] who visits regularly and is a huge source of support.    -Consider restart of Zoloft and/or addition of Remeron 15mg  for appetite/antidepressant/treatment of insomnia.    -[facility] counselor to reach out; discuss program with patient and her spouse.  H. Long h/o insomnia treated with hs routine of Xanax 0.25mg , Ativan 0.5mg , Trazadone 100mg , and Vistaril 10mg  qhs.   3. Cognitive / Functional status: A & O x . Notes tendency as of late difficulty word finding, forgetfulness. Manage her own medications. Independent in ADLs.  4. Follow up Palliative Care Visit: Mon 05/26/19 @ 1:30pm -call Gudina can we d/c lipitor, Remeron; patient wishes to discuss.  I spent 60 minutes providing this consultation from 3:30pm to 4:30pm. More than 50% of the time in this consultation was spent coordinating communication.   HISTORY OF PRESENT ILLNESS:  Jasmine Allen is a 54 year old woman with metastatic L breast cancer with mets to bone, lungs, liver, and R pleural space with recurrent malignant effusion (R PleurX; recent L thoracentesis . Dx May 2019. (past XRT, current Letrozole, Ibrance, Faslodex). PleurX 03/11/2019 R. CT chest reveals positive response to chemo (regression of lung changes and stable liver and bone lesions.. Palliative Care was asked to help address goals of care.   CODE STATUS: DNR  PPS: weak 60%  HOSPICE ELIGIBILITY/DIAGNOSIS: yes pending completion of aggressive treatment for cancer  PAST MEDICAL HISTORY:  Past Medical History:  Diagnosis Date  . Alopecia, unspecified   . Anemia, unspecified    during her pregnancy  . Anxiety state, unspecified   . Breast cancer ([place]) 2019   Right Breast Cancer  . Bulging disc   . Depressive disorder, not elsewhere classified   . Diverticulosis of colon (without mention of hemorrhage)    CT Scan  . Dysrhythmia    PVCs in the past  . Esophageal reflux   .  Esophagitis 1998  . Hiatal hernia 1998  . History of kidney stones   . Hypercholesteremia   . Hypertension   . Migraine   . Opioid abuse, in remission [facility])     SOCIAL HX:  Social History   Tobacco Use  . Smoking status: Former Smoker    Packs/day: 0.25    Quit date: 08/25/2016    Years since quitting: 2.7  . Smokeless tobacco: Never Used  Substance Use Topics  . Alcohol use: Yes    Comment: "once a week"    ALLERGIES:  Allergies  Allergen Reactions  . Amoxicillin-Pot Clavulanate Other (See Comments)    Big doses cause diarrhea   . Erythromycin Rash  . Metoclopramide Hcl Other (See Comments)    Restless legs  . Sulfonamide Derivatives Rash     PERTINENT MEDICATIONS:  Outpatient Encounter Medications as of 05/09/2019  Medication Sig  . albuterol (PROVENTIL) (2.5 MG/3ML) 0.083% nebulizer solution Inhale 3 mLs into the lungs every 6 (six) hours as needed for wheezing or shortness of breath (cough).  . ALPRAZolam (XANAX) 0.25 MG tablet Take 1 tablet (0.25 mg total) by mouth at bedtime as needed  for anxiety.  Marland Kitchen atorvastatin (LIPITOR) 20 MG tablet Take 20 mg by mouth at bedtime.   . Biotin 10000 MCG TABS Take 10,000 mcg by mouth daily.   . bumetanide (BUMEX) 1 MG tablet TAKE 1 TABLET(1 MG) BY MOUTH TWICE DAILY  . cetirizine (ZYRTEC) 10 MG tablet Take 10 mg by mouth daily.  . chlorpheniramine-HYDROcodone (TUSSIONEX PENNKINETIC ER) 10-8 MG/5ML SUER Take 5 mLs by mouth 2 (two) times daily.  . cyclobenzaprine (FLEXERIL) 10 MG tablet Take 10 mg by mouth every 8 (eight) hours as needed for muscle spasms.  Marland Kitchen dexamethasone (DECADRON) 1 MG tablet Take 1 tablet (1 mg total) by mouth daily with breakfast.  . diphenoxylate-atropine (LOMOTIL) 2.5-0.025 MG tablet Take 1 tablet by mouth 4 (four) times daily as needed for diarrhea or loose stools.  . hydrOXYzine (ATARAX/VISTARIL) 10 MG tablet Take 10-30 mg by mouth at bedtime as needed for itching.   . hyoscyamine (LEVSIN SL) 0.125 MG SL tablet  dissolve 1 tablet under the tongue every 4 hours if needed (Patient taking differently: Take 0.125 mg by mouth every 4 (four) hours as needed for cramping. )  . letrozole (FEMARA) 2.5 MG tablet Take 1 tablet (2.5 mg total) by mouth daily.  Marland Kitchen LORazepam (ATIVAN) 0.5 MG tablet Take 1 tablet (0.5 mg total) by mouth every 8 (eight) hours.  Marland Kitchen morphine (MS CONTIN) 15 MG 12 hr tablet Take 1 tablet (15 mg total) by mouth every 12 (twelve) hours.  Marland Kitchen morphine (MSIR) 15 MG tablet Take 1 tablet (15 mg total) by mouth every 6 (six) hours as needed for severe pain.  . Multiple Vitamin (MULITIVITAMIN WITH MINERALS) TABS Take 1 tablet by mouth daily.  . ondansetron (ZOFRAN ODT) 4 MG disintegrating tablet Take 1 tablet (4 mg total) by mouth every 8 (eight) hours as needed for nausea or vomiting.  . palbociclib (IBRANCE) 75 MG tablet Take 75 mg by mouth daily. Take for 21 days on, 7 days off, repeat every 28 days.  . pantoprazole (PROTONIX) 40 MG tablet Take 1 tablet (40 mg total) by mouth 2 (two) times daily before a meal. (Patient taking differently: Take 40 mg by mouth 2 (two) times daily. )  . polyethylene glycol (MIRALAX / GLYCOLAX) 17 g packet Take 17 g by mouth 2 (two) times daily. Decrease to daily if having watery or multiple loose stools daily.  . potassium chloride SA (KLOR-CON) 20 MEQ tablet Take 2 tablets (40 mEq total) by mouth daily. TAKE 1 TABLET(20 MEQ) BY MOUTH DAILY  . prochlorperazine (COMPAZINE) 10 MG tablet Take 1 tablet (10 mg total) by mouth every 6 (six) hours as needed for nausea or vomiting.  . senna (SENOKOT) 8.6 MG tablet Take by mouth.  . traZODone (DESYREL) 100 MG tablet Take 150 mg by mouth at bedtime.   . valACYclovir (VALTREX) 1000 MG tablet Take by mouth.  . venlafaxine XR (EFFEXOR-XR) 37.5 MG 24 hr capsule Take 1 capsule (37.5 mg total) by mouth daily with breakfast.   No facility-administered encounter medications on file as of 05/09/2019.    PHYSICAL EXAM:   General: NAD,  frail appearing, thin PE deferred to limit COVID exposure Extremities: mild LE edema to above ankles, no joint deformities Skin: no rashes Neurological: Weakness but otherwise non-focal  Julianne Handler, NP

## 2019-05-10 ENCOUNTER — Telehealth: Payer: Self-pay | Admitting: Internal Medicine

## 2019-05-10 ENCOUNTER — Encounter: Payer: Self-pay | Admitting: Internal Medicine

## 2019-05-10 NOTE — Telephone Encounter (Signed)
05/10/2019, 10am  TC from patient; she sustained a fall as getting out of bed. She c/o feeling some confusion and disorientation this am. Reported ankle pain (I forgot to ask her which side). I spoke to spouse, who reports patient able to put ankle trough ROM; some discomfort with this, and some discomfort with standing. No bruising/skin tear/swelling. We discussed that this could be a sprain, or even a hairline fracture. Best to keep weight off of site. Ice area 27min each hour for a few hours.  Patient also distressed b/c she feels she get confused as to which of her pills to take, and when.  I asked spouse to pick up a - walker and encourage patient to use consistently.  - pill box (7 am / 7 d) and set up a week's supply at a time.   Violeta Gelinas NP-C [phone]

## 2019-05-12 ENCOUNTER — Other Ambulatory Visit: Payer: Self-pay

## 2019-05-12 ENCOUNTER — Telehealth: Payer: Self-pay | Admitting: Internal Medicine

## 2019-05-12 ENCOUNTER — Ambulatory Visit (INDEPENDENT_AMBULATORY_CARE_PROVIDER_SITE_OTHER): Payer: 59 | Admitting: Orthopedic Surgery

## 2019-05-12 ENCOUNTER — Encounter: Payer: Self-pay | Admitting: Orthopedic Surgery

## 2019-05-12 ENCOUNTER — Ambulatory Visit (HOSPITAL_COMMUNITY)
Admission: RE | Admit: 2019-05-12 | Discharge: 2019-05-12 | Disposition: A | Discharge status: Home / Self Care | Payer: 59 | Source: Ambulatory Visit | Attending: Hematology and Oncology | Admitting: Hematology and Oncology

## 2019-05-12 ENCOUNTER — Inpatient Hospital Stay: Payer: 59 | Attending: Adult Health | Admitting: Hematology and Oncology

## 2019-05-12 ENCOUNTER — Telehealth: Payer: Self-pay | Admitting: Hematology and Oncology

## 2019-05-12 VITALS — BP 129/67 | HR 86 | Temp 98.5°F | Resp 18 | Ht 61.0 in | Wt 113.8 lb

## 2019-05-12 DIAGNOSIS — D509 Iron deficiency anemia, unspecified: Secondary | ICD-10-CM | POA: Diagnosis not present

## 2019-05-12 DIAGNOSIS — Z79811 Long term (current) use of aromatase inhibitors: Secondary | ICD-10-CM | POA: Diagnosis not present

## 2019-05-12 DIAGNOSIS — Z8249 Family history of ischemic heart disease and other diseases of the circulatory system: Secondary | ICD-10-CM | POA: Insufficient documentation

## 2019-05-12 DIAGNOSIS — M79604 Pain in right leg: Secondary | ICD-10-CM

## 2019-05-12 DIAGNOSIS — R180 Malignant ascites: Secondary | ICD-10-CM | POA: Diagnosis not present

## 2019-05-12 DIAGNOSIS — Z7952 Long term (current) use of systemic steroids: Secondary | ICD-10-CM | POA: Insufficient documentation

## 2019-05-12 DIAGNOSIS — Z5111 Encounter for antineoplastic chemotherapy: Secondary | ICD-10-CM | POA: Insufficient documentation

## 2019-05-12 DIAGNOSIS — E7800 Pure hypercholesterolemia, unspecified: Secondary | ICD-10-CM | POA: Diagnosis not present

## 2019-05-12 DIAGNOSIS — Z87891 Personal history of nicotine dependence: Secondary | ICD-10-CM | POA: Insufficient documentation

## 2019-05-12 DIAGNOSIS — C7800 Secondary malignant neoplasm of unspecified lung: Secondary | ICD-10-CM | POA: Insufficient documentation

## 2019-05-12 DIAGNOSIS — Z79899 Other long term (current) drug therapy: Secondary | ICD-10-CM | POA: Diagnosis not present

## 2019-05-12 DIAGNOSIS — I10 Essential (primary) hypertension: Secondary | ICD-10-CM | POA: Insufficient documentation

## 2019-05-12 DIAGNOSIS — Z9181 History of falling: Secondary | ICD-10-CM | POA: Insufficient documentation

## 2019-05-12 DIAGNOSIS — Z170 Estrogen receptor positive status [ER+]: Secondary | ICD-10-CM

## 2019-05-12 DIAGNOSIS — F329 Major depressive disorder, single episode, unspecified: Secondary | ICD-10-CM | POA: Insufficient documentation

## 2019-05-12 DIAGNOSIS — Z803 Family history of malignant neoplasm of breast: Secondary | ICD-10-CM | POA: Diagnosis not present

## 2019-05-12 DIAGNOSIS — C7951 Secondary malignant neoplasm of bone: Secondary | ICD-10-CM | POA: Diagnosis present

## 2019-05-12 DIAGNOSIS — C50919 Malignant neoplasm of unspecified site of unspecified female breast: Secondary | ICD-10-CM

## 2019-05-12 DIAGNOSIS — J910 Malignant pleural effusion: Secondary | ICD-10-CM | POA: Insufficient documentation

## 2019-05-12 DIAGNOSIS — S8265XA Nondisplaced fracture of lateral malleolus of left fibula, initial encounter for closed fracture: Secondary | ICD-10-CM | POA: Diagnosis not present

## 2019-05-12 DIAGNOSIS — C50311 Malignant neoplasm of lower-inner quadrant of right female breast: Secondary | ICD-10-CM | POA: Diagnosis present

## 2019-05-12 DIAGNOSIS — Z923 Personal history of irradiation: Secondary | ICD-10-CM | POA: Insufficient documentation

## 2019-05-12 DIAGNOSIS — Z9221 Personal history of antineoplastic chemotherapy: Secondary | ICD-10-CM | POA: Diagnosis not present

## 2019-05-12 DIAGNOSIS — K589 Irritable bowel syndrome without diarrhea: Secondary | ICD-10-CM | POA: Insufficient documentation

## 2019-05-12 MED ORDER — ALPRAZOLAM 0.25 MG PO TABS: 0.2500 mg | ORAL_TABLET | Freq: Every evening | ORAL | 3 refills | Status: AC | PRN

## 2019-05-12 MED ORDER — SERTRALINE HCL 25 MG PO TABS: 25.0000 mg | ORAL_TABLET | Freq: Every day | ORAL | 3 refills | Status: DC

## 2019-05-12 NOTE — Progress Notes (Signed)
Patient Care Team: Nicholas Lose, MD as PCP - General (Hematology and Oncology) Nicholas Lose, MD as Consulting Physician (Hematology and Oncology) Kyung Rudd, MD as Consulting Physician (Radiation Oncology) Rolm Bookbinder, MD as Consulting Physician (General Surgery) Julianne Handler, NP as Nurse Practitioner [facility] and Palliative Medicine)  DIAGNOSIS:  Encounter Diagnoses  Name Primary?  . Metastatic breast cancer ([place]) Yes  . Malignant neoplasm of lower-inner quadrant of right breast of female, estrogen receptor positive ([place])     SUMMARY OF ONCOLOGIC HISTORY: Oncology History  Malignant neoplasm of lower-inner quadrant of right breast of female, estrogen receptor positive ([place])  08/16/2017 Initial Diagnosis   Right lumpectomy: 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  High risk   08/16/2017 Cancer Staging   Staging form: Breast, AJCC 8th Edition - Pathologic stage from 08/16/2017: Stage IA (pT1c, pN0, cM0, G2, ER+, PR+, HER2-, Oncotype DX score: 31) - Signed by Gardenia Phlegm, NP on 09/12/2018   09/25/2017 - 02/05/2018 Chemotherapy   Adjuvant chemotherapy with dose dense Adriamycin and Cytoxan x4 followed by Taxol weekly x12    03/11/2018 - 04/25/2018 Radiation Therapy   Adjuvant XRT   01/09/2019 PET scan   Pulmonary hypermetabolic and consistent with lymphangitic tumor spread, confluent right middle lobe consolidation, hypermetabolic abdominal, cervical and left axillary lymph nodes, diffuse liver hypermetabolic some nonspecific.  Diffuse bone marrow hypermetabolism worrisome for metastatic disease.  Prior pericardial effusion resolved.   01/16/2019 Relapse/Recurrence   Pleural effusion: Thoracentesis revealed metastatic adenocarcinoma  breast primary ER 75%, PR 50%, HER-2 -1+ Bone marrow biopsy positive for metastatic breast cancer     CHIEF COMPLIANT: Follow-up after recent fall and injury to the right ankle  INTERVAL HISTORY: Jasmine Allen is a 54 year old with above-mentioned for metastatic breast cancer with pleural effusions and ascites malignant effusions.  She urgently came to our office because she fell in her home and twisted her ankle.  X-ray done today showed fracture of the distal fibula.  She tells me that it is not swollen and the pain is also much improved.  She was started on dexamethasone last week and this has not remarkably increase her energy and appetite.  She just started back on Ibrance yesterday.    ALLERGIES:  is allergic to amoxicillin-pot clavulanate; erythromycin; metoclopramide hcl; and sulfonamide derivatives.  MEDICATIONS:  Current Outpatient Medications  Medication Sig Dispense Refill  . ALPRAZolam (XANAX) 0.25 MG tablet Take 1 tablet (0.25 mg total) by mouth at bedtime as needed for anxiety. Takes one dose at bedtime for insomnia 30 tablet 3  . Biotin 10000 MCG TABS Take 10,000 mcg by mouth daily.     . bumetanide (BUMEX) 1 MG tablet TAKE 1 TABLET(1 MG) BY MOUTH TWICE DAILY 60 tablet 2  . chlorpheniramine-HYDROcodone (TUSSIONEX PENNKINETIC ER) 10-8 MG/5ML SUER Take 5 mLs by mouth 2 (two) times daily. (Patient not taking: Reported on 05/10/2019) 140 mL 0  . cyclobenzaprine (FLEXERIL) 10 MG tablet Take 10 mg by mouth every 8 (eight) hours as needed for muscle spasms.  1  . dexamethasone (DECADRON) 1 MG tablet Take 1 tablet (1 mg total) by mouth daily with breakfast. 30 tablet 0  . diphenoxylate-atropine (LOMOTIL) 2.5-0.025 MG tablet Take 1 tablet by mouth 4 (  four) times daily as needed for diarrhea or loose stools. (Patient not taking: Reported on 05/10/2019) 30 tablet 1  . hydrOXYzine (ATARAX/VISTARIL) 10 MG tablet Take 10-30 mg by mouth at bedtime as needed for itching.   0  . letrozole  (FEMARA) 2.5 MG tablet Take 1 tablet (2.5 mg total) by mouth daily. 90 tablet 3  . LORazepam (ATIVAN) 0.5 MG tablet Take 1 tablet (0.5 mg total) by mouth every 8 (eight) hours. (Patient taking differently: Take 0.5 mg by mouth every 8 (eight) hours. Taking qhs for insomnia) 30 tablet 3  . morphine (MS CONTIN) 15 MG 12 hr tablet Take 1 tablet (15 mg total) by mouth every 12 (twelve) hours. 60 tablet 0  . morphine (MSIR) 15 MG tablet Take 1 tablet (15 mg total) by mouth every 6 (six) hours as needed for severe pain. 60 tablet 0  . Multiple Vitamin (MULITIVITAMIN WITH MINERALS) TABS Take 1 tablet by mouth daily.    . ondansetron (ZOFRAN ODT) 4 MG disintegrating tablet Take 1 tablet (4 mg total) by mouth every 8 (eight) hours as needed for nausea or vomiting. (Patient not taking: Reported on 05/10/2019) 20 tablet 0  . palbociclib (IBRANCE) 75 MG tablet Take 75 mg by mouth daily. Take for 21 days on, 7 days off, repeat every 28 days.    . pantoprazole (PROTONIX) 40 MG tablet Take 1 tablet (40 mg total) by mouth 2 (two) times daily before a meal. (Patient taking differently: Take 40 mg by mouth 2 (two) times daily. ) 60 tablet 6  . polyethylene glycol (MIRALAX / GLYCOLAX) 17 g packet Take 17 g by mouth 2 (two) times daily. Decrease to daily if having watery or multiple loose stools daily. (Patient not taking: Reported on 05/10/2019) 30 each 0  . potassium chloride SA (KLOR-CON) 20 MEQ tablet Take 2 tablets (40 mEq total) by mouth daily. TAKE 1 TABLET(20 MEQ) BY MOUTH DAILY (Patient taking differently: Take 40 mEq by mouth daily. Currently taking bid) 60 tablet 6  . prochlorperazine (COMPAZINE) 10 MG tablet Take 1 tablet (10 mg total) by mouth every 6 (six) hours as needed for nausea or vomiting. 30 tablet 0  . senna (SENOKOT) 8.6 MG tablet Take by mouth.    . sertraline (ZOLOFT) 25 MG tablet Take 1 tablet (25 mg total) by mouth daily. 30 tablet 3  . traZODone (DESYREL) 100 MG tablet Take 150 mg by mouth at  bedtime.     . valACYclovir (VALTREX) 1000 MG tablet Take by mouth. Prn fever blisters     No current facility-administered medications for this visit.    PHYSICAL EXAMINATION: ECOG PERFORMANCE STATUS: 3 - Symptomatic, >50% confined to bed  Vitals:   05/12/19 1109  BP: 129/67  Pulse: 86  Resp: 18  Temp: 98.5 F (36.9 C)  SpO2: 100%   Filed Weights   05/12/19 1109  Weight: 113 lb 12.8 oz (51.6 kg)       LABORATORY DATA:  I have reviewed the data as listed CMP Latest Ref Rng & Units 04/21/2019 04/08/2019 03/21/2019  Glucose 70 - 99 mg/dL 206(H) 128(H) 300(H)  BUN 6 - 20 mg/dL 13 5(L) 8  Creatinine 0.44 - 1.00 mg/dL 0.66 0.61 0.66  Sodium 135 - 145 mmol/L 137 137 136  Potassium 3.5 - 5.1 mmol/L 3.9 3.1(L) 3.4(L)  Chloride 98 - 111 mmol/L 105 101 101  CO2 22 - 32 mmol/L _0 Calcium 8.9 - 10.3 mg/dL 7.4(L) 7.5(L) 7.3(L)  Total Protein 6.5 - 8.1 g/dL 5.4(L) 5.5(L) 5.2(L)  Total Bilirubin 0.3 - 1.2 mg/dL 1.0 1.6(H) 1.1  Alkaline Phos 38 - 126 U/L 188(H) 213(H) 261(H)  AST 15 - 41 U/L 66(H) 67(H) 57(H)  ALT 0 - 44 U/L 32 35 33    Lab Results  Component Value Date   WBC 1.9 (L) 04/21/2019   HGB 10.0 (L) 04/21/2019   HCT 29.6 (L) 04/21/2019   MCV 97.4 04/21/2019   PLT 40 (L) 04/21/2019   NEUTROABS 1.1 (L) 04/21/2019    ASSESSMENT & PLAN:  Malignant neoplasm of lower-inner quadrant of right breast of female, estrogen receptor positive ([facility]) 08/16/2017:Right lumpectomy: Grade 2 IDC 1.5 cm, with DCIS and necrosis, 0/3 lymph nodes negative, ER 95%, PR 30%, HER-2 negative ratio 1.14, Ki-67 40%, T1CN0 stage Ia; resection of the margin 09/11/2017: Benign  01/09/2019: PET CT scan:Pulmonary hypermetabolic and consistent with lymphangitic tumor spread, confluent right middle lobe consolidation, hypermetabolic abdominal, cervical and left axillary lymph nodes, diffuse liver hypermetabolic some nonspecific. Diffuse bone marrow hypermetabolism worrisome for metastatic disease.  Prior pericardial effusion resolved.  01/16/2019:Pleural effusion: Thoracentesis revealed metastatic adenocarcinoma breast primary ER 75%, PR 50%, HER-2 -1+ Bone marrow biopsy positive for metastatic breast cancer  [facility] molecular testing: ER/PR positive HER-2 negative, ER positive, TMB low (1), BRCA 1 and 2 -, ESR 1 mutation not detected, PI K3 CA negative Guardant 360: T p53, no evidence of MSI high -------------------------------------------------------------------------------------------------------------------------------------------------------------- Current treatment: Letrozole 2.5 mg daily started 01/15/2019, Ibrance started 01/24/2019, Faslodex and Xgeva added 02/21/2019 Hospitalization: 02/03/19- 02/07/19 Malignant Pleural effusion S/P thoracentesisstatus post Pleurx catheter placement 03/11/2019  Plan: 1.Faslodex added to the treatment11/13/2020 2. Ibrance 75 mg, currently on 3 weeks on 2 weeks off regimen 3.Xgeva q 3 months  Toxicities: 1. Severe anemia:   : Does not require blood transfusion 2. Severe thrombocytopenia: Platelet count is will be rechecked in a few days. 3. Severe fatigue:  Improved with starting dexamethasone 4. Decreased appetite: We prescribed her dexamethasone.:  This is remarkably improved her appetite.  CT scan 04/08/2019: Positive response to treatment with regression of the lung changes and stable liver and bone lesions.  Ankle pain: X-ray of the right ankle 05/12/2019: Spiral fracture of the distal fibula. We are in contact with Dr. Randel Pigg office with orthopedics to see if they can see her for immobilization.     Orders Placed This Encounter  Procedures  . CT Abdomen Pelvis W Contrast    Standing Status:   Future    Standing Expiration Date:   05/11/2020    Order Specific Question:   If indicated for the ordered procedure, I authorize the administration of contrast media per Radiology protocol    Answer:   Yes    Order Specific  Question:   Is patient pregnant?    Answer:   No    Order Specific Question:   Preferred imaging location?    Answer:   [facility]    Order Specific Question:   Is Oral Contrast requested for this exam?    Answer:   Yes, Per Radiology protocol    Order Specific Question:   Radiology Contrast Protocol - do NOT remove file path    Answer:   _0 [email or web address]  . CT Chest W Contrast    Standing Status:   Future    Standing Expiration Date:   05/11/2020    Order Specific Question:   ** REASON FOR EXAM (FREE TEXT)    Answer:  Metastatic breast cancer restaging    Order Specific Question:   If indicated for the ordered procedure, I authorize the administration of contrast media per Radiology protocol    Answer:   Yes    Order Specific Question:   Is patient pregnant?    Answer:   No    Order Specific Question:   Preferred imaging location?    Answer:   [facility]    Order Specific Question:   Radiology Contrast Protocol - do NOT remove file path    Answer:   _0 [email or web address]   The patient has a good understanding of the overall plan. she agrees with it. she will call with any problems that may develop before the next visit here. Total time spent: 30 mins including face to face time and time spent for planning, charting and co-ordination of care   Harriette Ohara, MD 05/12/19

## 2019-05-12 NOTE — Progress Notes (Signed)
Pt called to report fall on 1/30 while stepping out of bed. "I was getting out of bed and just lost my balance."  Pt with pain to right leg, "above ankle."  Pt denies hitting head, or any other pain. Pt able to bear minimal weight to leg.   RN reviewed with MD, verbal orders given for x-ray of right Tib/Fib and follow up after.   Pt aware, verbalized understanding.  Orders placed.

## 2019-05-12 NOTE — Assessment & Plan Note (Signed)
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 Diffuse bone marrow hypermetabolism worrisome for metastatic disease. Prior pericardial effusion resolved.  01/16/2019:Pleural effusion: Thoracentesis revealed metastatic adenocarcinoma breast primary ER 75%, PR 50%, HER-2 -1+ Bone marrow biopsy positive for metastatic breast cancer  [facility] molecular testing: ER/PR positive HER-2 negative, ER positive, TMB low (1), BRCA 1 and 2 -, ESR 1 mutation not detected, PI K3 CA negative Guardant 360: T p53, no evidence of MSI high -------------------------------------------------------------------------------------------------------------------------------------------------------------- Current treatment: Letrozole 2.5 mg daily started 01/15/2019, Ibrance started 01/24/2019, Faslodex and Xgeva added 02/21/2019 Hospitalization: 02/03/19- 02/07/19 Malignant Pleural effusion S/P thoracentesisstatus post Pleurx catheter placement 03/11/2019  Plan: 1.Faslodex added to the treatment11/13/2020 2. Ibrance 75 mg, currently on 3 weeks on 2 weeks off regimen 3.Xgeva q 3 months  Toxicities: 1. Severe anemia: Hemoglobin is : Does not require blood transfusion 2. Severe thrombocytopenia: Platelet count is   3. Severe fatigue:  Continues to be a problem. 4. Decreased appetite: We prescribed her dexamethasone.  CT scan 04/08/2019: Positive response to treatment with regression of the lung changes and stable liver and bone lesions.  Ankle pain: X-ray of the right ankle 05/12/2019:

## 2019-05-12 NOTE — Telephone Encounter (Signed)
Rescheduled per provider. Called and left a msg. Mailing printout  

## 2019-05-12 NOTE — Telephone Encounter (Signed)
10:00am:   F/U TC to check in. Patient mentioned decreased pain to leg injured by fall though some swelling at site; planning f/u X ray to that area today. She has been utilizing her walker as needed.   We discussed she can cut her MSO4 IR 15mg  in half if she finds herself too somnolent/confused with the whole dose, and see if this gives her adequate pain relief with less side effects. She reports that actually she seems to be having less of that side effect, as time as gone on.   Laliah is bringing her MOST form with her to her doctor's appointment today. I mentioned to Rikia that her physician can sign, or she can call me to swing by the home and sign, if she wished.   Violeta Gelinas NP-C [phone]

## 2019-05-13 ENCOUNTER — Telehealth: Payer: Self-pay | Admitting: Internal Medicine

## 2019-05-13 NOTE — Telephone Encounter (Signed)
Spoke with the pt  She states that she is wanting to just let Dr Tamala Julian and Dr Carlis Abbott know that today she drained 120 ml of pleural fluid  She drained 175 on 1/31  She states that back in Dec she was getting around 400  She states her breathing seems to be doing well  Will forward to both MD's as Juluis Rainier

## 2019-05-13 NOTE — Telephone Encounter (Signed)
Great hopefully it keeps dropping over time

## 2019-05-14 NOTE — Telephone Encounter (Signed)
Agree, this is good news. If it ever stops entirely the plan would be to take the catheter out.  Arby Barrette

## 2019-05-15 ENCOUNTER — Telehealth: Payer: Self-pay | Admitting: Internal Medicine

## 2019-05-15 NOTE — Telephone Encounter (Signed)
Patient drainage continues to drop. Will do once a week drainage. Sees me in 2 weeks can consider removal at that date.  Erskine Emery MD PCCM

## 2019-05-18 ENCOUNTER — Encounter: Payer: Self-pay | Admitting: Orthopedic Surgery

## 2019-05-18 NOTE — Progress Notes (Signed)
Office Visit Note   Patient: Jasmine Allen           Date of Birth: 09/22/1965           MRN: [id number] Visit Date: 05/12/2019 Requested by: Nicholas Lose, MD [place],  [place] [address] PCP: Nicholas Lose, MD  Subjective: Chief Complaint  Patient presents with  . Right Leg - Pain    HPI: Jasmine Allen is a patient with breast cancer diagnosed in 2019.  She has metastatic breast cancer currently.  She sustained an injury and has lateral malleolus fracture based on radiographic analysis to 121.  This happened when she fell Saturday getting out of bed.  She has been doing little weightbearing in the wheelchair.  She is on morphine for her cancer.  Denies any pain in the knee or foot.  She is able to partial weight-bear.              ROS: All systems reviewed are negative as they relate to the chief complaint within the history of present illness.  Patient denies  fevers or chills.   Assessment & Plan: Visit Diagnoses:  1. Nondisplaced fracture of lateral malleolus of left fibula, initial encounter for closed fracture     Plan: Impression is nondisplaced fracture right distal fibula.  Plan is handicap placard plus weightbearing as tolerated in fracture boot.  Today return for clinical recheck on alignment and pain.  Follow-Up Instructions: Return in about 10 days (around 05/22/2019).   Orders:  No orders of the defined types were placed in this encounter.  No orders of the defined types were placed in this encounter.     Procedures: No procedures performed   Clinical Data: No additional findings.  Objective: Vital Signs: LMP 12/24/2010   Physical Exam:   Constitutional: Patient appears well-developed HEENT:  Head: Normocephalic Eyes:EOM are normal Neck: Normal range of motion Cardiovascular: Normal rate Pulmonary/chest: Effort normal Neurologic: Patient is alert Skin: Skin is warm Psychiatric: Patient has normal mood and  affect    Ortho Exam: Ortho exam demonstrates full active and passive range of motion of the right ankle.  Compartments are soft.  Ankle dorsiflexion plantarflexion intact.  No masses lymphadenopathy or skin changes noted in that right leg region.  Mild tenderness over that metaphyseal region of the malleolus no medial sided tenderness is present.  Mortise is symmetric and stable  Specialty Comments:  No specialty comments available.  Imaging: No results found.   PMFS History: Patient Active Problem List   Diagnosis Date Noted  . Palliative care by specialist   . Goals of care, counseling/discussion   . Atypical chest pain   . Metastatic breast cancer ([place])   . Cancer related pain   . Abnormal CT scan, lung 01/13/2019  . Pleural effusion 01/13/2019  . Dyspnea 01/13/2019  . Community acquired pneumonia 01/03/2019  . Port-A-Cath in place 10/23/2017  . Malignant neoplasm of lower-inner quadrant of right breast of female, estrogen receptor positive ([place]) 08/23/2017  . Pain of anterior chest wall with respiration 04/07/2014  . Smoking 04/07/2014  . ANEMIA, IRON DEFICIENCY 10/20/2009  . DEPRESSION 06/15/2009  . GERD 06/15/2009  . IRRITABLE BOWEL SYNDROME 06/15/2009  . RECTAL BLEEDING 06/15/2009   Past Medical History:  Diagnosis Date  . Alopecia, unspecified   . Anemia, unspecified    during her pregnancy  . Anxiety state, unspecified   . Breast cancer ([place]) 2019   Right Breast Cancer  . Bulging disc   .  Depressive disorder, not elsewhere classified   . Diverticulosis of colon (without mention of hemorrhage)    CT Scan  . Dysrhythmia    PVCs in the past  . Esophageal reflux   . Esophagitis 1998  . Hiatal hernia 1998  . History of kidney stones   . Hypercholesteremia   . Hypertension   . Migraine   . Opioid abuse, in remission [facility])     Family History  Problem Relation Age of Onset  . Heart disease Father        CABG  . Breast cancer Mother   . Breast cancer  Maternal Grandmother   . Breast cancer Cousin   . Colon cancer Neg Hx   . Stomach cancer Neg Hx     Past Surgical History:  Procedure Laterality Date  . ABLATION    . BREAST LUMPECTOMY Right 08/16/2017  . BREAST LUMPECTOMY WITH AXILLARY LYMPH NODE BIOPSY Right 08/16/2017   Procedure: RIGHT BREAST LUMPECTOMY WITH AXILLARY SENTINEL LYMPH NODE BIOPSY;  Surgeon: Rolm Bookbinder, MD;  Location: [place];  Service: General;  Laterality: Right;  . CARDIAC CATHETERIZATION  2011   normal coronaries  . KNEE SURGERY    . PORTACATH PLACEMENT Right 09/11/2017   Procedure: INSERTION PORT-A-CATH WITH ULTRASOUND;  Surgeon: Rolm Bookbinder, MD;  Location: [place];  Service: General;  Laterality: Right;  . RE-EXCISION OF BREAST LUMPECTOMY Right 09/11/2017   Procedure: RE-EXCISION OF BREAST LUMPECTOMY;  Surgeon: Rolm Bookbinder, MD;  Location: [place];  Service: General;  Laterality: Right;  . TONSILLECTOMY     Social History   Occupational History  . Not on file  Tobacco Use  . Smoking status: Former Smoker    Packs/day: 0.25    Quit date: 08/25/2016    Years since quitting: 2.7  . Smokeless tobacco: Never Used  Substance and Sexual Activity  . Alcohol use: Yes    Comment: "once a week"  . Drug use: No  . Sexual activity: Yes    Birth control/protection: None, Post-menopausal

## 2019-05-19 ENCOUNTER — Telehealth: Payer: Self-pay | Admitting: Cardiovascular Disease

## 2019-05-19 NOTE — Telephone Encounter (Signed)
New Message  Pt called to set an appt. Wanted an earlier appt with Dr. Johnsie Cancel due to him saving her life from breast cancer. Stated that she would like a nurse or Dr. Johnsie Cancel to give her a phone call   Please call

## 2019-05-19 NOTE — Telephone Encounter (Signed)
Scheduled patient on 06/09/19 to see Dr. Johnsie Cancel.

## 2019-05-21 ENCOUNTER — Inpatient Hospital Stay: Payer: 59

## 2019-05-21 ENCOUNTER — Inpatient Hospital Stay (HOSPITAL_BASED_OUTPATIENT_CLINIC_OR_DEPARTMENT_OTHER): Payer: 59 | Admitting: Adult Health

## 2019-05-21 ENCOUNTER — Other Ambulatory Visit: Payer: Self-pay

## 2019-05-21 ENCOUNTER — Ambulatory Visit: Payer: 59 | Admitting: Cardiovascular Disease

## 2019-05-21 VITALS — BP 129/89 | HR 69 | Temp 98.5°F | Resp 18 | Ht 61.0 in | Wt 115.0 lb

## 2019-05-21 VITALS — BP 131/65 | HR 64 | Resp 18

## 2019-05-21 DIAGNOSIS — Z95828 Presence of other vascular implants and grafts: Secondary | ICD-10-CM

## 2019-05-21 DIAGNOSIS — Z170 Estrogen receptor positive status [ER+]: Secondary | ICD-10-CM | POA: Diagnosis not present

## 2019-05-21 DIAGNOSIS — C50311 Malignant neoplasm of lower-inner quadrant of right female breast: Secondary | ICD-10-CM | POA: Diagnosis not present

## 2019-05-21 DIAGNOSIS — Z5111 Encounter for antineoplastic chemotherapy: Secondary | ICD-10-CM | POA: Diagnosis not present

## 2019-05-21 DIAGNOSIS — C50919 Malignant neoplasm of unspecified site of unspecified female breast: Secondary | ICD-10-CM

## 2019-05-21 LAB — CBC WITH DIFFERENTIAL (CANCER CENTER ONLY)
Abs Immature Granulocytes: 0.09 10*3/uL — ABNORMAL HIGH (ref 0.00–0.07)
Basophils Absolute: 0 10*3/uL (ref 0.0–0.1)
Basophils Relative: 1 %
Eosinophils Absolute: 0 10*3/uL (ref 0.0–0.5)
Eosinophils Relative: 1 %
HCT: 32.4 % — ABNORMAL LOW (ref 36.0–46.0)
Hemoglobin: 10.5 g/dL — ABNORMAL LOW (ref 12.0–15.0)
Immature Granulocytes: 2 %
Lymphocytes Relative: 15 %
Lymphs Abs: 0.8 10*3/uL (ref 0.7–4.0)
MCH: 35 pg — ABNORMAL HIGH (ref 26.0–34.0)
MCHC: 32.4 g/dL (ref 30.0–36.0)
MCV: 108 fL — ABNORMAL HIGH (ref 80.0–100.0)
Monocytes Absolute: 0.3 10*3/uL (ref 0.1–1.0)
Monocytes Relative: 6 %
Neutro Abs: 4.1 10*3/uL (ref 1.7–7.7)
Neutrophils Relative %: 75 %
Platelet Count: 74 10*3/uL — ABNORMAL LOW (ref 150–400)
RBC: 3 MIL/uL — ABNORMAL LOW (ref 3.87–5.11)
RDW: 25.4 % — ABNORMAL HIGH (ref 11.5–15.5)
WBC Count: 5.3 10*3/uL (ref 4.0–10.5)
nRBC: 3 % — ABNORMAL HIGH (ref 0.0–0.2)

## 2019-05-21 LAB — CMP (CANCER CENTER ONLY)
ALT: 67 U/L — ABNORMAL HIGH (ref 0–44)
AST: 73 U/L — ABNORMAL HIGH (ref 15–41)
Albumin: 3.1 g/dL — ABNORMAL LOW (ref 3.5–5.0)
Alkaline Phosphatase: 224 U/L — ABNORMAL HIGH (ref 38–126)
Anion gap: 10 (ref 5–15)
BUN: 15 mg/dL (ref 6–20)
CO2: 29 mmol/L (ref 22–32)
Calcium: 8.1 mg/dL — ABNORMAL LOW (ref 8.9–10.3)
Chloride: 99 mmol/L (ref 98–111)
Creatinine: 0.62 mg/dL (ref 0.44–1.00)
GFR, Est AFR Am: >60 mL/min (ref >60)
GFR, Estimated: >60 mL/min (ref >60)
Glucose, Bld: 131 mg/dL — ABNORMAL HIGH (ref 70–99)
Potassium: 3.5 mmol/L (ref 3.5–5.1)
Sodium: 138 mmol/L (ref 135–145)
Total Bilirubin: 1.3 mg/dL — ABNORMAL HIGH (ref 0.3–1.2)
Total Protein: 6.2 g/dL — ABNORMAL LOW (ref 6.5–8.1)

## 2019-05-21 LAB — SAMPLE TO BLOOD BANK

## 2019-05-21 MED ORDER — DENOSUMAB 120 MG/1.7ML ~~LOC~~ SOLN
SUBCUTANEOUS | Status: AC
  Filled 2019-05-21: qty 1.7

## 2019-05-21 MED ORDER — FULVESTRANT 250 MG/5ML IM SOLN
INTRAMUSCULAR | Status: AC
  Filled 2019-05-21: qty 5

## 2019-05-21 MED ORDER — DENOSUMAB 120 MG/1.7ML ~~LOC~~ SOLN
120.0000 mg | Freq: Once | SUBCUTANEOUS | Status: AC
  Administered 2019-05-21: 120 mg via SUBCUTANEOUS

## 2019-05-21 MED ORDER — FULVESTRANT 250 MG/5ML IM SOLN
500.0000 mg | Freq: Once | INTRAMUSCULAR | Status: AC
  Administered 2019-05-21: 500 mg via INTRAMUSCULAR

## 2019-05-21 NOTE — Patient Instructions (Signed)
Denosumab injection What is this medicine? DENOSUMAB (den oh sue mab) slows bone breakdown. Prolia is used to treat osteoporosis in women after menopause and in men, and in people who are taking corticosteroids for 6 months or more. Xgeva is used to treat a high calcium level due to cancer and to prevent bone fractures and other bone problems caused by multiple myeloma or cancer bone metastases. Xgeva is also used to treat giant cell tumor of the bone. This medicine may be used for other purposes; ask your health care provider or pharmacist if you have questions. COMMON BRAND NAME(S): Prolia, XGEVA What should I tell my health care provider before I take this medicine? They need to know if you have any of these conditions:  dental disease  having surgery or tooth extraction  infection  kidney disease  low levels of calcium or Vitamin D in the blood  malnutrition  on hemodialysis  skin conditions or sensitivity  thyroid or parathyroid disease  an unusual reaction to denosumab, other medicines, foods, dyes, or preservatives  pregnant or trying to get pregnant  breast-feeding How should I use this medicine? This medicine is for injection under the skin. It is given by a health care professional in a hospital or clinic setting. A special MedGuide will be given to you before each treatment. Be sure to read this information carefully each time. For Prolia, talk to your pediatrician regarding the use of this medicine in children. Special care may be needed. For Xgeva, talk to your pediatrician regarding the use of this medicine in children. While this drug may be prescribed for children as young as 13 years for selected conditions, precautions do apply. Overdosage: If you think you have taken too much of this medicine contact a poison control center or emergency room at once. NOTE: This medicine is only for you. Do not share this medicine with others. What if I miss a dose? It is  important not to miss your dose. Call your doctor or health care professional if you are unable to keep an appointment. What may interact with this medicine? Do not take this medicine with any of the following medications:  other medicines containing denosumab This medicine may also interact with the following medications:  medicines that lower your chance of fighting infection  steroid medicines like prednisone or cortisone This list may not describe all possible interactions. Give your health care provider a list of all the medicines, herbs, non-prescription drugs, or dietary supplements you use. Also tell them if you smoke, drink alcohol, or use illegal drugs. Some items may interact with your medicine. What should I watch for while using this medicine? Visit your doctor or health care professional for regular checks on your progress. Your doctor or health care professional may order blood tests and other tests to see how you are doing. Call your doctor or health care professional for advice if you get a fever, chills or sore throat, or other symptoms of a cold or flu. Do not treat yourself. This drug may decrease your body's ability to fight infection. Try to avoid being around people who are sick. You should make sure you get enough calcium and vitamin D while you are taking this medicine, unless your doctor tells you not to. Discuss the foods you eat and the vitamins you take with your health care professional. See your dentist regularly. Brush and floss your teeth as directed. Before you have any dental work done, tell your dentist you are   receiving this medicine. Do not become pregnant while taking this medicine or for 5 months after stopping it. Talk with your doctor or health care professional about your birth control options while taking this medicine. Women should inform their doctor if they wish to become pregnant or think they might be pregnant. There is a potential for serious side  effects to an unborn child. Talk to your health care professional or pharmacist for more information. What side effects may I notice from receiving this medicine? Side effects that you should report to your doctor or health care professional as soon as possible:  allergic reactions like skin rash, itching or hives, swelling of the face, lips, or tongue  bone pain  breathing problems  dizziness  jaw pain, especially after dental work  redness, blistering, peeling of the skin  signs and symptoms of infection like fever or chills; cough; sore throat; pain or trouble passing urine  signs of low calcium like fast heartbeat, muscle cramps or muscle pain; pain, tingling, numbness in the hands or feet; seizures  unusual bleeding or bruising  unusually weak or tired Side effects that usually do not require medical attention (report to your doctor or health care professional if they continue or are bothersome):  constipation  diarrhea  headache  joint pain  loss of appetite  muscle pain  runny nose  tiredness  upset stomach This list may not describe all possible side effects. Call your doctor for medical advice about side effects. You may report side effects to FDA at 1-800-FDA-1088. Where should I keep my medicine? This medicine is only given in a clinic, doctor's office, or other health care setting and will not be stored at home. NOTE: This sheet is a summary. It may not cover all possible information. If you have questions about this medicine, talk to your doctor, pharmacist, or health care provider.  2020 Elsevier/Gold Standard (2017-08-03 16:10:44) Fulvestrant injection What is this medicine? FULVESTRANT (ful VES trant) blocks the effects of estrogen. It is used to treat breast cancer. This medicine may be used for other purposes; ask your health care provider or pharmacist if you have questions. COMMON BRAND NAME(S): FASLODEX What should I tell my health care  provider before I take this medicine? They need to know if you have any of these conditions:  bleeding disorders  liver disease  low blood counts, like low white cell, platelet, or red cell counts  an unusual or allergic reaction to fulvestrant, other medicines, foods, dyes, or preservatives  pregnant or trying to get pregnant  breast-feeding How should I use this medicine? This medicine is for injection into a muscle. It is usually given by a health care professional in a hospital or clinic setting. Talk to your pediatrician regarding the use of this medicine in children. Special care may be needed. Overdosage: If you think you have taken too much of this medicine contact a poison control center or emergency room at once. NOTE: This medicine is only for you. Do not share this medicine with others. What if I miss a dose? It is important not to miss your dose. Call your doctor or health care professional if you are unable to keep an appointment. What may interact with this medicine?  medicines that treat or prevent blood clots like warfarin, enoxaparin, dalteparin, apixaban, dabigatran, and rivaroxaban This list may not describe all possible interactions. Give your health care provider a list of all the medicines, herbs, non-prescription drugs, or dietary supplements you use.   Also tell them if you smoke, drink alcohol, or use illegal drugs. Some items may interact with your medicine. What should I watch for while using this medicine? Your condition will be monitored carefully while you are receiving this medicine. You will need important blood work done while you are taking this medicine. Do not become pregnant while taking this medicine or for at least 1 year after stopping it. Women of child-bearing potential will need to have a negative pregnancy test before starting this medicine. Women should inform their doctor if they wish to become pregnant or think they might be pregnant. There is  a potential for serious side effects to an unborn child. Men should inform their doctors if they wish to father a child. This medicine may lower sperm counts. Talk to your health care professional or pharmacist for more information. Do not breast-feed an infant while taking this medicine or for 1 year after the last dose. What side effects may I notice from receiving this medicine? Side effects that you should report to your doctor or health care professional as soon as possible:  allergic reactions like skin rash, itching or hives, swelling of the face, lips, or tongue  feeling faint or lightheaded, falls  pain, tingling, numbness, or weakness in the legs  signs and symptoms of infection like fever or chills; cough; flu-like symptoms; sore throat  vaginal bleeding Side effects that usually do not require medical attention (report to your doctor or health care professional if they continue or are bothersome):  aches, pains  constipation  diarrhea  headache  hot flashes  nausea, vomiting  pain at site where injected  stomach pain This list may not describe all possible side effects. Call your doctor for medical advice about side effects. You may report side effects to FDA at 1-800-FDA-1088. Where should I keep my medicine? This drug is given in a hospital or clinic and will not be stored at home. NOTE: This sheet is a summary. It may not cover all possible information. If you have questions about this medicine, talk to your doctor, pharmacist, or health care provider.  2020 Elsevier/Gold Standard (2017-07-05 11:34:41)  

## 2019-05-21 NOTE — Progress Notes (Signed)
Per MD okay to treat with lab results

## 2019-05-21 NOTE — Assessment & Plan Note (Addendum)
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 Diffuse bone marrow hypermetabolism worrisome for metastatic disease. Prior pericardial effusion resolved.  01/16/2019:Pleural effusion: Thoracentesis revealed metastatic adenocarcinoma breast primary ER 75%, PR 50%, HER-2 -1+ Bone marrow biopsy positive for metastatic breast cancer  [facility] molecular testing: ER/PR positive HER-2 negative, ER positive, TMB low (1), BRCA 1 and 2 -, ESR 1 mutation not detected, PI K3 CA negative Guardant 360: T p53, no evidence of MSI high -------------------------------------------------------------------------------------------------------------------------------------------------------------- Current treatment: Letrozole 2.5 mg daily started 01/15/2019, Ibrance started 01/24/2019, Faslodex and Xgeva added 02/21/2019 Hospitalization: 02/03/19- 02/07/19 Malignant Pleural effusion S/P thoracentesisstatus post Pleurx catheter placement 03/11/2019  Plan: 1.Faslodex added to the treatment11/13/2020 2. Ibrance 75 mg, currently on 3 weeks on 2 weeks off regimen 3.Xgeva q 3 months  Her labs are improved today.  She is tolerating treatment well and will proceed with treatment today.  Her platelet count is improved.  She is very tearful and happy with how she is improving.  She understands to take extra calcium with the Xgeva.     She will return in 4 weeks for labs, f/u with Dr. Lindi Adie, and her next injection.

## 2019-05-21 NOTE — Progress Notes (Signed)
[facility] Follow up:    Jasmine Lose, MD [place] [id number]   DIAGNOSIS: Cancer Staging Malignant neoplasm of lower-inner quadrant of right breast of female, estrogen receptor positive ([place]) Staging form: Breast, AJCC 8th Edition - Pathologic stage from 08/16/2017: Stage IA (pT1c, pN0, cM0, G2, ER+, PR+, HER2-, Oncotype DX score: 31) - Signed by Gardenia Phlegm, NP on 09/12/2018   SUMMARY OF ONCOLOGIC HISTORY: Oncology History  Malignant neoplasm of lower-inner quadrant of right breast of female, estrogen receptor positive ([place])  08/16/2017 Initial Diagnosis   Right lumpectomy: 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  High risk   08/16/2017 Cancer Staging   Staging form: Breast, AJCC 8th Edition - Pathologic stage from 08/16/2017: Stage IA (pT1c, pN0, cM0, G2, ER+, PR+, HER2-, Oncotype DX score: 31) - Signed by Gardenia Phlegm, NP on 09/12/2018   09/25/2017 - 02/05/2018 Chemotherapy   Adjuvant chemotherapy with dose dense Adriamycin and Cytoxan x4 followed by Taxol weekly x12    03/11/2018 - 04/25/2018 Radiation Therapy   Adjuvant XRT   01/09/2019 PET scan   Pulmonary hypermetabolic and consistent with lymphangitic tumor spread, confluent right middle lobe consolidation, hypermetabolic abdominal, cervical and left axillary lymph nodes, diffuse liver hypermetabolic some nonspecific.  Diffuse bone marrow hypermetabolism worrisome for metastatic disease.  Prior pericardial effusion resolved.   01/16/2019 Relapse/Recurrence   Pleural effusion: Thoracentesis revealed metastatic adenocarcinoma breast primary ER 75%, PR 50%, HER-2 -1+ Bone marrow biopsy positive for metastatic breast cancer      CURRENT THERAPY: Letrozole/Palbociclib/Faslodex/Xgeva  INTERVAL HISTORY: Jasmine Allen 54 y.o. female returns for evaluation and f/u of her metastatic breast cancer.  She says that she is feeling the best she has felt.  She is taking Letrozole daily and tolerates this without difficulty.  She take Palbociclib 47m 3 weeks on 2 weeks off.  She is 1.5 weeks into the Palbociclib.  She also receives Faslodex every 4 weeks, and Xgeva every 12 weeks.    She has a pleurx and her drainage has continued to decrease and she is potentially going to be able to have this removed if it continues to remain low.     Patient Active Problem List   Diagnosis Date Noted  . Palliative care by specialist   . Goals of care, counseling/discussion   . Atypical chest pain   . [place]astatic breast cancer [place]   . Cancer related pain   . Abnormal CT scan, lung 01/13/2019  . Pleural effusion 01/13/2019  . Dyspnea 01/13/2019  . Community acquired pneumonia 01/03/2019  . Port-A-Cath in place 10/23/2017  . Malignant neoplasm of lower-inner quadrant of right breast of female, estrogen receptor positive ([place] 08/23/2017  . Pain of anterior chest wall with respiration 04/07/2014  . Smoking 04/07/2014  . ANEMIA, IRON DEFICIENCY 10/20/2009  . DEPRESSION 06/15/2009  . GERD 06/15/2009  . IRRITABLE BOWEL SYNDROME 06/15/2009  . RECTAL BLEEDING 06/15/2009    is allergic to amoxicillin-pot clavulanate; erythromycin; metoclopramide hcl; and sulfonamide derivatives.  MEDICAL HISTORY: Past Medical History:  Diagnosis Date  . Alopecia, unspecified   . Anemia, unspecified    during her pregnancy  . Anxiety state, unspecified   . Breast cancer ([place] 2019  Right Breast Cancer  . Bulging disc   . Depressive disorder, not elsewhere classified   . Diverticulosis of colon (without mention of hemorrhage)    CT Scan  . Dysrhythmia    PVCs in the past  . Esophageal reflux   . Esophagitis 1998  . Hiatal hernia 1998   . History of kidney stones   . Hypercholesteremia   . Hypertension   . Migraine   . Opioid abuse, in remission [facility])     SURGICAL HISTORY: Past Surgical History:  Procedure Laterality Date  . ABLATION    . BREAST LUMPECTOMY Right 08/16/2017  . BREAST LUMPECTOMY WITH AXILLARY LYMPH NODE BIOPSY Right 08/16/2017   Procedure: RIGHT BREAST LUMPECTOMY WITH AXILLARY SENTINEL LYMPH NODE BIOPSY;  Surgeon: Rolm Bookbinder, MD;  Location: [place];  Service: General;  Laterality: Right;  . CARDIAC CATHETERIZATION  2011   normal coronaries  . KNEE SURGERY    . PORTACATH PLACEMENT Right 09/11/2017   Procedure: INSERTION PORT-A-CATH WITH ULTRASOUND;  Surgeon: Rolm Bookbinder, MD;  Location: [place];  Service: General;  Laterality: Right;  . RE-EXCISION OF BREAST LUMPECTOMY Right 09/11/2017   Procedure: RE-EXCISION OF BREAST LUMPECTOMY;  Surgeon: Rolm Bookbinder, MD;  Location: [place];  Service: General;  Laterality: Right;  . TONSILLECTOMY      SOCIAL HISTORY: Social History   Socioeconomic History  . Marital status: Married    Spouse name: Not on file  . Number of children: Not on file  . Years of education: Not on file  . Highest education level: Not on file  Occupational History  . Not on file  Tobacco Use  . Smoking status: Former Smoker    Packs/day: 0.25    Quit date: 08/25/2016    Years since quitting: 2.7  . Smokeless tobacco: Never Used  Substance and Sexual Activity  . Alcohol use: Yes    Comment: "once a week"  . Drug use: No  . Sexual activity: Yes    Birth control/protection: None, Post-menopausal  Other Topics Concern  . Not on file  Social History Narrative  . Not on file   Social Determinants of Health   Financial Resource Strain:   . Difficulty of Paying Living Expenses: Not on file  Food Insecurity:   . Worried About Charity fundraiser in the Last Year: Not on file  . Ran Out of Food in the Last Year: Not on file   Transportation Needs:   . Lack of Transportation (Medical): Not on file  . Lack of Transportation (Non-Medical): Not on file  Physical Activity:   . Days of Exercise per Week: Not on file  . Minutes of Exercise per Session: Not on file  Stress:   . Feeling of Stress : Not on file  Social Connections:   . Frequency of Communication with Friends and Family: Not on file  . Frequency of Social Gatherings with Friends and Family: Not on file  . Attends Religious Services: Not on file  . Active Member of Clubs or Organizations: Not on file  . Attends Archivist Meetings: Not on file  . Marital Status: Not on file  Intimate Partner Violence:   . Fear of Current or Ex-Partner: Not on file  . Emotionally Abused: Not on file  . Physically Abused: Not on file  . Sexually Abused: Not on file    FAMILY HISTORY: Family History  Problem Relation Age of Onset  . Heart disease Father  CABG  . Breast cancer Mother   . Breast cancer Maternal Grandmother   . Breast cancer Cousin   . Colon cancer Neg Hx   . Stomach cancer Neg Hx     Review of Systems  Constitutional: Positive for fatigue. Negative for appetite change, chills, fever and unexpected weight change.  HENT:   Negative for hearing loss and lump/mass.   Eyes: Negative for eye problems and icterus.  Respiratory: Negative for chest tightness, cough and shortness of breath.   Cardiovascular: Negative for chest pain, leg swelling and palpitations.  Gastrointestinal: Negative for abdominal distention, abdominal pain, constipation, diarrhea, nausea and vomiting.  Endocrine: Negative for hot flashes.  Musculoskeletal: Negative for arthralgias.  Skin: Negative for itching and rash.  Neurological: Negative for dizziness, extremity weakness, headaches and numbness.  Hematological: Negative for adenopathy. Does not bruise/bleed easily.  Psychiatric/Behavioral: Negative for depression. The patient is not nervous/anxious.        PHYSICAL EXAMINATION  ECOG PERFORMANCE STATUS: 3 - Symptomatic, >50% confined to bed  Vitals:   05/21/19 1348  BP: 129/89  Pulse: 69  Resp: 18  Temp: 98.5 F (36.9 C)  SpO2: 99%    Physical Exam Constitutional:      General: She is not in acute distress.    Appearance: Normal appearance. She is not toxic-appearing.  HENT:     Head: Normocephalic and atraumatic.  Eyes:     General: No scleral icterus. Cardiovascular:     Rate and Rhythm: Normal rate and regular rhythm.     Pulses: Normal pulses.     Heart sounds: Normal heart sounds.  Pulmonary:     Effort: Pulmonary effort is normal.     Breath sounds: Normal breath sounds.  Abdominal:     General: Abdomen is flat. Bowel sounds are normal.     Palpations: Abdomen is soft.  Skin:    General: Skin is warm and dry.     Capillary Refill: Capillary refill takes less than 2 seconds.     Findings: No rash.  Neurological:     General: No focal deficit present.     Mental Status: She is alert.  Psychiatric:        Mood and Affect: Mood normal.        Behavior: Behavior normal.     LABORATORY DATA:  CBC    Component Value Date/Time   WBC 5.3 05/21/2019 1253   WBC 0.9 (LL) 02/06/2019 0524   RBC 3.00 (L) 05/21/2019 1253   HGB 10.5 (L) 05/21/2019 1253   HCT 32.4 (L) 05/21/2019 1253   PLT 74 (L) 05/21/2019 1253   MCV 108.0 (H) 05/21/2019 1253   MCH 35.0 (H) 05/21/2019 1253   MCHC 32.4 05/21/2019 1253   RDW 25.4 (H) 05/21/2019 1253   LYMPHSABS 0.8 05/21/2019 1253   MONOABS 0.3 05/21/2019 1253   EOSABS 0.0 05/21/2019 1253   BASOSABS 0.0 05/21/2019 1253    CMP     Component Value Date/Time   NA 138 05/21/2019 1253   K 3.5 05/21/2019 1253   CL 99 05/21/2019 1253   CO2 29 05/21/2019 1253   GLUCOSE 131 (H) 05/21/2019 1253   BUN 15 05/21/2019 1253   CREATININE 0.62 05/21/2019 1253   CALCIUM 8.1 (L) 05/21/2019 1253   PROT 6.2 (L) 05/21/2019 1253   ALBUMIN 3.1 (L) 05/21/2019 1253   AST 73 (H) 05/21/2019  1253   ALT 67 (H) 05/21/2019 1253   ALKPHOS 224 (H) 05/21/2019 1253   BILITOT  1.3 (H) 05/21/2019 1253   GFRNONAA >60 05/21/2019 1253   GFRAA >60 05/21/2019 1253       PENDING LABS:   RADIOGRAPHIC STUDIES:  No results found.   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 Diffuse bone marrow hypermetabolism worrisome for metastatic disease. Prior pericardial effusion resolved.  01/16/2019:Pleural effusion: Thoracentesis revealed metastatic adenocarcinoma breast primary ER 75%, PR 50%, HER-2 -1+ Bone marrow biopsy positive for metastatic breast cancer  [facility] molecular testing: ER/PR positive HER-2 negative, ER positive, TMB low (1), BRCA 1 and 2 -, ESR 1 mutation not detected, PI K3 CA negative Guardant 360: T p53, no evidence of MSI high -------------------------------------------------------------------------------------------------------------------------------------------------------------- Current treatment: Letrozole 2.5 mg daily started 01/15/2019, Ibrance started 01/24/2019, Faslodex and Xgeva added 02/21/2019 Hospitalization: 02/03/19- 02/07/19 Malignant Pleural effusion S/P thoracentesisstatus post Pleurx catheter placement 03/11/2019  Plan: 1.Faslodex added to the treatment11/13/2020 2. Ibrance 75 mg, currently on 3 weeks on 2 weeks off regimen 3.Xgeva q 3 months  Her labs are improved today.  She  is tolerating treatment well and will proceed with treatment today.  Her platelet count is improved.  She is very tearful and happy with how she is improving.  She understands to take extra calcium with the Xgeva.     She will return in 4 weeks for labs, f/u with Dr. Lindi Adie, and her next injection.      All questions were answered. The patient knows to call the clinic with any problems, questions or concerns. We can certainly see the patient much sooner if necessary.  Total encounter time: 30 minutes*  This note was electronically signed.  Wilber Bihari, NP 05/23/19 2:32 PM Medical Oncology and Hematology [facility] [place], [place] [address] Tel. [phone]    Fax. [phone]  *Total Encounter Time as defined by the Centers for Medicare and Medicaid Services includes, in addition to the face-to-face time of a patient visit (documented in the note above) non-face-to-face time: obtaining and reviewing outside history, ordering and reviewing medications, tests or procedures, care coordination (communications with other health care professionals or caregivers) and documentation in the medical record.

## 2019-05-22 ENCOUNTER — Ambulatory Visit: Payer: 59

## 2019-05-22 ENCOUNTER — Other Ambulatory Visit: Payer: 59

## 2019-05-22 ENCOUNTER — Ambulatory Visit: Payer: 59 | Admitting: Hematology and Oncology

## 2019-05-22 ENCOUNTER — Telehealth: Payer: Self-pay | Admitting: Hematology and Oncology

## 2019-05-22 ENCOUNTER — Ambulatory Visit (INDEPENDENT_AMBULATORY_CARE_PROVIDER_SITE_OTHER): Payer: 59

## 2019-05-22 ENCOUNTER — Ambulatory Visit (INDEPENDENT_AMBULATORY_CARE_PROVIDER_SITE_OTHER): Payer: 59 | Admitting: Orthopedic Surgery

## 2019-05-22 DIAGNOSIS — S82831D Other fracture of upper and lower end of right fibula, subsequent encounter for closed fracture with routine healing: Secondary | ICD-10-CM

## 2019-05-22 NOTE — Telephone Encounter (Signed)
Patient request to move to Thursday so she could be scheduled in the mornng

## 2019-05-23 ENCOUNTER — Encounter: Payer: Self-pay | Admitting: Adult Health

## 2019-05-24 ENCOUNTER — Encounter: Payer: Self-pay | Admitting: Orthopedic Surgery

## 2019-05-24 NOTE — Progress Notes (Signed)
Post-Op Visit Note   Patient: Jasmine Allen           Date of Birth: 04-Mar-1966           MRN: [id number] Visit Date: 05/22/2019 PCP: Nicholas Lose, MD   Assessment & Plan:  Chief Complaint:  Chief Complaint  Patient presents with  . Right Leg - Follow-up   Visit Diagnoses:  1. Closed fracture of distal end of right fibula with routine healing, unspecified fracture morphology, subsequent encounter     Plan: Saraii is a patient is now about 10 days out lateral ankle malleolar fracture on the right.  On exam more swelling is present today than prior clinic visit.  Minimal medial sided tenderness is present.  Radiographs show about 1 to 2 mm of lateral displacement but no shortening of that lateral malleolus fracture.  Mortise remains symmetric.  I Georgina Peer really try to decrease her weightbearing on that right ankle.  Continue the fracture boot at all times and then we will see her back in 10 days for repeat radiographs to make sure is not displacing anymore.  No calf tenderness and negative Homans today.  Follow-Up Instructions: Return in about 10 days (around 06/01/2019).   Orders:  Orders Placed This Encounter  Procedures  . XR Ankle Complete Right   No orders of the defined types were placed in this encounter.   Imaging: No results found.  PMFS History: Patient Active Problem List   Diagnosis Date Noted  . Palliative care by specialist   . Goals of care, counseling/discussion   . Atypical chest pain   . Metastatic breast cancer ([place])   . Cancer related pain   . Abnormal CT scan, lung 01/13/2019  . Pleural effusion 01/13/2019  . Dyspnea 01/13/2019  . Community acquired pneumonia 01/03/2019  . Port-A-Cath in place 10/23/2017  . Malignant neoplasm of lower-inner quadrant of right breast of female, estrogen receptor positive ([place]) 08/23/2017  . Pain of anterior chest wall with respiration 04/07/2014  . Smoking 04/07/2014  . ANEMIA, IRON DEFICIENCY 10/20/2009  .  DEPRESSION 06/15/2009  . GERD 06/15/2009  . IRRITABLE BOWEL SYNDROME 06/15/2009  . RECTAL BLEEDING 06/15/2009   Past Medical History:  Diagnosis Date  . Alopecia, unspecified   . Anemia, unspecified    during her pregnancy  . Anxiety state, unspecified   . Breast cancer ([place]) 2019   Right Breast Cancer  . Bulging disc   . Depressive disorder, not elsewhere classified   . Diverticulosis of colon (without mention of hemorrhage)    CT Scan  . Dysrhythmia    PVCs in the past  . Esophageal reflux   . Esophagitis 1998  . Hiatal hernia 1998  . History of kidney stones   . Hypercholesteremia   . Hypertension   . Migraine   . Opioid abuse, in remission [facility])     Family History  Problem Relation Age of Onset  . Heart disease Father        CABG  . Breast cancer Mother   . Breast cancer Maternal Grandmother   . Breast cancer Cousin   . Colon cancer Neg Hx   . Stomach cancer Neg Hx     Past Surgical History:  Procedure Laterality Date  . ABLATION    . BREAST LUMPECTOMY Right 08/16/2017  . BREAST LUMPECTOMY WITH AXILLARY LYMPH NODE BIOPSY Right 08/16/2017   Procedure: RIGHT BREAST LUMPECTOMY WITH AXILLARY SENTINEL LYMPH NODE BIOPSY;  Surgeon: Rolm Bookbinder, MD;  Location:  [facility];  Service: General;  Laterality: Right;  . CARDIAC CATHETERIZATION  2011   normal coronaries  . KNEE SURGERY    . PORTACATH PLACEMENT Right 09/11/2017   Procedure: INSERTION PORT-A-CATH WITH ULTRASOUND;  Surgeon: Rolm Bookbinder, MD;  Location: [facility];  Service: General;  Laterality: Right;  . RE-EXCISION OF BREAST LUMPECTOMY Right 09/11/2017   Procedure: RE-EXCISION OF BREAST LUMPECTOMY;  Surgeon: Rolm Bookbinder, MD;  Location: [facility];  Service: General;  Laterality: Right;  . TONSILLECTOMY     Social History   Occupational History  . Not on file  Tobacco Use  . Smoking status: Former Smoker    Packs/day: 0.25    Quit date: 08/25/2016    Years since  quitting: 2.7  . Smokeless tobacco: Never Used  Substance and Sexual Activity  . Alcohol use: Yes    Comment: "once a week"  . Drug use: No  . Sexual activity: Yes    Birth control/protection: None, Post-menopausal

## 2019-05-24 NOTE — Progress Notes (Signed)
Pulmonary Office Followup Note 05/24/19 Seen in f/u for malignant effusion  S: 54 year old woman with metastatic breast cancer with mets to bone, lungs, and R pleural space with recurrent malignant effusion.   S/P R pleurX 03/11/19 S/P L thoracentesis 04/29/19  Last visit: L thora + paracentesis which helped breathing.  Encouraged protein supplementation.  Interim: Draining less now.  Unfortunately, had traumatic nondisplaced R ankle fracture after fall on 05/12/19 managed with NWB and boot.  In much better spirits, labs look better, to get next round of ibrance this week. Keeping nutritional status up  O: Today's Vitals   05/27/19 1346  BP: 122/70  Pulse: 67  SpO2: 98%  Weight: 117 lb (53.1 kg)  Height: 5\' 1"  (1.549 m)   Body mass index is 22.11 kg/m. On 3L  GEN: middle aged woman in NAD HEENT: no thrush, RRR CV: RRR, ext warm PULM: Clear on R, diminished L base GI: Soft, +BS, less distended EXT: less LE edema NEURO: Moves all 4 ext to command PSYCH: AOx3, excellent insight  SKIN: shows me a photo of her site from today, looks great  Labs 2/10 reviewed: Albumin 3.1 from 2.6 WBC 5.3 from 1.9 Plts 74 from 40  A: # Recurrent malignant R effusion s/p PleurX 12/1 with entrapment by symptoms # Metastatic breast cancer on letrozole,Ibrance, and faslodex # Pain due to bone and nerve infiltration of tumor # Chemo induced pancytopenia # Malignant L effusion drained 04/29/19 # Ascites and anasarca due to protein calorie malnutrition, path neg; improved # Ankle fracture 05/12/19 nonoperatively managed  P: -Switch to Monday-Friday drainage -Continue protein supplementation -Touch base with [place] every 2 weeks by phone to let us know how much is draining -Once drops off < 100cc on 2-3 consecutive drains will remove pleurX  MDM . I reviewed prior external note(s) from NP Causey (Heme/Onc) on 05/21/19 . I reviewed the result(s) of pathology from 04/29/19 ascites (neg) and left  pleural fluid (positive)   Erskine Emery MD

## 2019-05-25 NOTE — Progress Notes (Addendum)
Feb 15th, 2021 [facility] Consult Note Telephone: [phone]  Fax: [phone]   PATIENT NAME: Jasmine Allen DOB: 1966-02-15 MRN: [id number] [address] [place] [address] [phone]   PRIMARY CARE PROVIDER:   Nicholas Lose, MD (LOV 04/21/19)  Dr. Candee Furbish [facility])   REFERRING PROVIDER:  Aletha Halim., PA-C [address]  [place] [address]   RESPONSIBLE PARTY:  Spouse [name]   ASSESSMENT / RECOMMENDATIONS:  1. Advance Care Planning:             A. Directives: DNR in home and in [facility] EMR. Patient has MOST form signed by Dr. Burr Medico. We reviewed and solidified her wishes:  DNR/DNI, Limited Scope of Medical Interventions, yes to IVFs and Antibiotics. No to Tube Feeding.              B. Goals of Care: Really hoping to have R chest PlurX tube removed soon. She has a f/u office visit with her pulmonologist tomorrow     2. Symptom Management:             A. Fatigue d/t illness/deconditioning/opioids: Notes improvement in endurance; less fatigue since our last visit. Seems to be building a tolerance to the sedating effects of her BID long acting opioids. She had fractured her R ankle (1-59mm lateral displacement) after falling out of bed about 2 weeks ago, which is being treated with fracture boot / current partial weight bearing. No further falls.                B. Decreased appetite of illness: Current weight 115 lbs, which is stable from her 118 lbs about 1 month ago. At a height of 5'1" her BMI is 23kg.m2. Continues on Decadron for appetite stimulation; she has titrated herself down to  tab qod. She has had no nausea for 3 weeks. She continues consuming daily protein drinks and bars.                                       C. Pain: Continues dull, moderately severe, concentrated R flank, but also occasional anterior chest and R upper abdominal under R rib. Managed with MS  Contin 15mg  bid, with MSO4 IR 15mg  bid q 6 hr prn breakthrough pain. She only takes the IR MSO4 at bedtime so that she isn't awoken with pain. Mentions that she does have some pain free times, and that she is able to do her ADLs and ambulate okay at this pain level. She is satisfied with this regimen. She is not having to supplement with Tylenol                                       D. Opioid induced constipation: She just started her 3rd cycle of Ibrance which can cause loose stool. LBM this am. Her current regimen is taking a Senokot q3d if no BM. She doesn't want to alter this regimen.               E. Dyspnea form lung cancer/malignant pleural effusion: R PleurX CT present since 03/12/2019, draining decreasing amounts fluid. She's hoping tube can be removed very soon. No LE edema. Only mild dyspnea with walking longer distances within the home, then quickly  recovers with rest. No longer needing oxygen.               G. Coping setting of life-threatening illness/situational and prior h/o depression: Patient is in good spirts today; feeling hopeful d/t improved lab results, and tolerance to chemo with minimal side effects, improved endurance, well managed pain, improvement in dyspnea, hope to have PleurX removed, and progressive healing of ankle fracture. She continues Zoloft.  Counselor from [employer] did reach out a few weeks ago, to offer supportive counseling for her two boys age 21 and 49. However, patient was at a doctor's appointment at the time of that contact. Patient mentions that she has the [facility] number and will call them when she feels it is the right time. One of her boys mentioned insomnia; patient is using a lavender spray and he says this has helped. I encouraged patient to reach out to her son's pediatricians for suggestions regarding supportive counseling / insomnia as well.  (form previous note) Patient's husband is super supportive, and they share their hopes and concerns. They  are trying to maintain a normal routine for the kids. Patient copes by talking with her close girlfriend network. She has a best / childhood friend [name] who visits regularly and is a huge source of support. Also, patient mom, who lives close by, is now visiting daily. Patient is gratified that she and her mom have reconnected from a previously strained relationship. Overall, patient is humbled and grateful for the support her family and friends have provided.               H. Long h/o insomnia treated with hs routine of Xanax 0.25mg , Ativan 0.5mg , Trazadone 100mg , and Vistaril 10mg  qhs.    3. Cognitive / Functional status: A & O x . Notes tendency as of late difficulty word finding, forgetfulness. Manage her own medications. Independent in ADLs.   4. Follow up Palliative Care Visit: Tues 06/24/2019 @ noon  I spent 60 minutes providing this consultation from 3:30pm to 4:30pm. More than 50% of the time in this consultation was spent coordinating communication.    HISTORY OF PRESENT ILLNESS:  Jasmine Allen is a 54 year old woman with metastatic L breast cancer with mets to bone, lungs, liver, and R pleural space with recurrent malignant effusion (R PleurX; recent L thoracentesis . Dx May 2019. (past XRT, current Letrozole, Ibrance, Faslodex). PleurX 03/11/2019 R. CT chest reveals positive response to chemo (regression of lung changes and stable liver and bone lesions. 05/10/2019 Fx or R lateral ankle Malleolar fx (1-19mm lateral displacement). Fracture boot.   Palliative Care was asked to help address goals of care.   05/21/2019 labs improved. Tolerating treatment well. Platelet count improved This is a f/u Palliative Care visit from 05/09/2019.    CODE STATUS: DNR   PPS: weak 60%   HOSPICE ELIGIBILITY/DIAGNOSIS: yes pending completion of aggressive treatment for cancer  PAST MEDICAL HISTORY:  Past Medical History:  Diagnosis Date  . Alopecia, unspecified   . Anemia, unspecified    during her  pregnancy  . Anxiety state, unspecified   . Breast cancer ([place]) 2019   Right Breast Cancer  . Bulging disc   . Depressive disorder, not elsewhere classified   . Diverticulosis of colon (without mention of hemorrhage)    CT Scan  . Dysrhythmia    PVCs in the past  . Esophageal reflux   . Esophagitis 1998  . Hiatal hernia 1998  . History  of kidney stones   . Hypercholesteremia   . Hypertension   . Migraine   . Opioid abuse, in remission [facility])     SOCIAL HX:  Social History   Tobacco Use  . Smoking status: Former Smoker    Packs/day: 0.25    Quit date: 08/25/2016    Years since quitting: 2.7  . Smokeless tobacco: Never Used  Substance Use Topics  . Alcohol use: Yes    Comment: "once a week"    ALLERGIES:  Allergies  Allergen Reactions  . Amoxicillin-Pot Clavulanate Other (See Comments)    Big doses cause diarrhea   . Erythromycin Rash  . Metoclopramide Hcl Other (See Comments)    Restless legs  . Sulfonamide Derivatives Rash     PERTINENT MEDICATIONS:  Outpatient Encounter Medications as of 05/26/2019  Medication Sig  . ALPRAZolam (XANAX) 0.25 MG tablet Take 1 tablet (0.25 mg total) by mouth at bedtime as needed for anxiety. Takes one dose at bedtime for insomnia  . Biotin 10000 MCG TABS Take 10,000 mcg by mouth daily.   . bumetanide (BUMEX) 1 MG tablet TAKE 1 TABLET(1 MG) BY MOUTH TWICE DAILY  . chlorpheniramine-HYDROcodone (TUSSIONEX PENNKINETIC ER) 10-8 MG/5ML SUER Take 5 mLs by mouth 2 (two) times daily.  . cyclobenzaprine (FLEXERIL) 10 MG tablet Take 10 mg by mouth every 8 (eight) hours as needed for muscle spasms.  Marland Kitchen dexamethasone (DECADRON) 1 MG tablet Take 1 tablet (1 mg total) by mouth daily with breakfast.  . diphenoxylate-atropine (LOMOTIL) 2.5-0.025 MG tablet Take 1 tablet by mouth 4 (four) times daily as needed for diarrhea or loose stools.  . hydrOXYzine (ATARAX/VISTARIL) 10 MG tablet Take 10-30 mg by mouth at bedtime as needed for itching.   . letrozole  (FEMARA) 2.5 MG tablet Take 1 tablet (2.5 mg total) by mouth daily.  Marland Kitchen LORazepam (ATIVAN) 0.5 MG tablet Take 1 tablet (0.5 mg total) by mouth every 8 (eight) hours. (Patient taking differently: Take 0.5 mg by mouth every 8 (eight) hours. Taking qhs for insomnia)  . morphine (MS CONTIN) 15 MG 12 hr tablet Take 1 tablet (15 mg total) by mouth every 12 (twelve) hours.  Marland Kitchen morphine (MSIR) 15 MG tablet Take 1 tablet (15 mg total) by mouth every 6 (six) hours as needed for severe pain.  . Multiple Vitamin (MULITIVITAMIN WITH MINERALS) TABS Take 1 tablet by mouth daily.  . ondansetron (ZOFRAN ODT) 4 MG disintegrating tablet Take 1 tablet (4 mg total) by mouth every 8 (eight) hours as needed for nausea or vomiting.  . palbociclib (IBRANCE) 75 MG tablet Take 75 mg by mouth daily. Take for 21 days on, 7 days off, repeat every 28 days.  . pantoprazole (PROTONIX) 40 MG tablet Take 1 tablet (40 mg total) by mouth 2 (two) times daily before a meal. (Patient taking differently: Take 40 mg by mouth 2 (two) times daily. )  . polyethylene glycol (MIRALAX / GLYCOLAX) 17 g packet Take 17 g by mouth 2 (two) times daily. Decrease to daily if having watery or multiple loose stools daily.  . potassium chloride SA (KLOR-CON) 20 MEQ tablet Take 2 tablets (40 mEq total) by mouth daily. TAKE 1 TABLET(20 MEQ) BY MOUTH DAILY (Patient taking differently: Take 40 mEq by mouth daily. Currently taking bid)  . prochlorperazine (COMPAZINE) 10 MG tablet Take 1 tablet (10 mg total) by mouth every 6 (six) hours as needed for nausea or vomiting.  . senna (SENOKOT) 8.6 MG tablet Take by mouth.  Marland Kitchen  sertraline (ZOLOFT) 25 MG tablet Take 1 tablet (25 mg total) by mouth daily.  . traZODone (DESYREL) 100 MG tablet Take 150 mg by mouth at bedtime.   . valACYclovir (VALTREX) 1000 MG tablet Take by mouth. Prn fever blisters   No facility-administered encounter medications on file as of 05/26/2019.    PHYSICAL EXAM:   General: NAD, frail  appearing, thin PE deferred to limit COVID exposure Extremities: R LE with scattered brusing about ankle  Skin: no rashes Neurological: Weakness but otherwise non-focal  Julianne Handler, NP

## 2019-05-26 ENCOUNTER — Other Ambulatory Visit: Payer: 59 | Admitting: Internal Medicine

## 2019-05-26 ENCOUNTER — Other Ambulatory Visit: Payer: Self-pay

## 2019-05-26 ENCOUNTER — Other Ambulatory Visit: Payer: Self-pay | Admitting: Adult Health

## 2019-05-26 ENCOUNTER — Encounter: Payer: Self-pay | Admitting: Internal Medicine

## 2019-05-26 DIAGNOSIS — Z515 Encounter for palliative care: Secondary | ICD-10-CM

## 2019-05-26 DIAGNOSIS — Z7189 Other specified counseling: Secondary | ICD-10-CM

## 2019-05-26 DIAGNOSIS — C50311 Malignant neoplasm of lower-inner quadrant of right female breast: Secondary | ICD-10-CM

## 2019-05-26 MED ORDER — MORPHINE SULFATE 15 MG PO TABS: 15.0000 mg | ORAL_TABLET | Freq: Four times a day (QID) | ORAL | 0 refills | Status: DC | PRN

## 2019-05-27 ENCOUNTER — Encounter: Payer: Self-pay | Admitting: Internal Medicine

## 2019-05-27 ENCOUNTER — Ambulatory Visit (INDEPENDENT_AMBULATORY_CARE_PROVIDER_SITE_OTHER): Payer: 59 | Admitting: Internal Medicine

## 2019-05-27 ENCOUNTER — Other Ambulatory Visit: Payer: Self-pay

## 2019-05-27 VITALS — BP 122/70 | HR 67 | Ht 61.0 in | Wt 117.0 lb

## 2019-05-27 DIAGNOSIS — J90 Pleural effusion, not elsewhere classified: Secondary | ICD-10-CM

## 2019-05-27 NOTE — Patient Instructions (Signed)
2 month f/u call if breathing worse or PleurX output drops off

## 2019-05-30 ENCOUNTER — Ambulatory Visit: Payer: 59

## 2019-06-02 ENCOUNTER — Ambulatory Visit (INDEPENDENT_AMBULATORY_CARE_PROVIDER_SITE_OTHER): Payer: 59 | Admitting: Orthopedic Surgery

## 2019-06-02 ENCOUNTER — Other Ambulatory Visit: Payer: Self-pay

## 2019-06-02 ENCOUNTER — Ambulatory Visit (INDEPENDENT_AMBULATORY_CARE_PROVIDER_SITE_OTHER): Payer: 59

## 2019-06-02 DIAGNOSIS — S82831D Other fracture of upper and lower end of right fibula, subsequent encounter for closed fracture with routine healing: Secondary | ICD-10-CM

## 2019-06-03 ENCOUNTER — Encounter: Payer: Self-pay | Admitting: Orthopedic Surgery

## 2019-06-03 NOTE — Progress Notes (Signed)
Post-Op Visit Note   Patient: Jasmine Allen           Date of Birth: Jul 23, 1965           MRN: [id number] Visit Date: 06/02/2019 PCP: Nicholas Lose, MD   Assessment & Plan:  Chief Complaint:  Chief Complaint  Patient presents with  . Right Ankle - Follow-up   Visit Diagnoses:  1. Closed fracture of distal end of right fibula with routine healing, unspecified fracture morphology, subsequent encounter     Plan: Nor[place] is a patient is now about 3 weeks out right lateral malleolus fracture.  On exam the swelling is decreased and she still only has mild tenderness.  Radiographs look good.  Negative Homans.  Plan is Ace wrap and okay to come out fracture boot in 1 week.  Okay to weight-bear on that ankle in 1 week in the fracture boot.  Come back in 3 weeks for clinical recheck repeat radiographs and likely release at that time.  Follow-Up Instructions: No follow-ups on file.   Orders:  Orders Placed This Encounter  Procedures  . XR Ankle Complete Right   No orders of the defined types were placed in this encounter.   Imaging: No results found.  PMFS History: Patient Active Problem List   Diagnosis Date Noted  . Palliative care by specialist   . Goals of care, counseling/discussion   . Atypical chest pain   . Metastatic breast cancer ([place])   . Cancer related pain   . Abnormal CT scan, lung 01/13/2019  . Pleural effusion 01/13/2019  . Dyspnea 01/13/2019  . Community acquired pneumonia 01/03/2019  . Port-A-Cath in place 10/23/2017  . Malignant neoplasm of lower-inner quadrant of right breast of female, estrogen receptor positive ([place]) 08/23/2017  . Pain of anterior chest wall with respiration 04/07/2014  . Smoking 04/07/2014  . ANEMIA, IRON DEFICIENCY 10/20/2009  . DEPRESSION 06/15/2009  . GERD 06/15/2009  . IRRITABLE BOWEL SYNDROME 06/15/2009  . RECTAL BLEEDING 06/15/2009   Past Medical History:  Diagnosis Date  . Alopecia, unspecified   . Anemia, unspecified     during her pregnancy  . Anxiety state, unspecified   . Breast cancer ([place]) 2019   Right Breast Cancer  . Bulging disc   . Depressive disorder, not elsewhere classified   . Diverticulosis of colon (without mention of hemorrhage)    CT Scan  . Dysrhythmia    PVCs in the past  . Esophageal reflux   . Esophagitis 1998  . Hiatal hernia 1998  . History of kidney stones   . Hypercholesteremia   . Hypertension   . Migraine   . Opioid abuse, in remission [place])     Family History  Problem Relation Age of Onset  . Heart disease Father        CABG  . Breast cancer Mother   . Breast cancer Maternal Grandmother   . Breast cancer Cousin   . Colon cancer Neg Hx   . Stomach cancer Neg Hx     Past Surgical History:  Procedure Laterality Date  . ABLATION    . BREAST LUMPECTOMY Right 08/16/2017  . BREAST LUMPECTOMY WITH AXILLARY LYMPH NODE BIOPSY Right 08/16/2017   Procedure: RIGHT BREAST LUMPECTOMY WITH AXILLARY SENTINEL LYMPH NODE BIOPSY;  Surgeon: Rolm Bookbinder, MD;  Location: [facility];  Service: General;  Laterality: Right;  . CARDIAC CATHETERIZATION  2011   normal coronaries  . KNEE SURGERY    . PORTACATH PLACEMENT Right 09/11/2017  Procedure: INSERTION PORT-A-CATH WITH ULTRASOUND;  Surgeon: Rolm Bookbinder, MD;  Location: [facility] [place];  Service: General;  Laterality: Right;  . RE-EXCISION OF BREAST LUMPECTOMY Right 09/11/2017   Procedure: RE-EXCISION OF BREAST LUMPECTOMY;  Surgeon: Rolm Bookbinder, MD;  Location: [place];  Service: General;  Laterality: Right;  . TONSILLECTOMY     Social History   Occupational History  . Not on file  Tobacco Use  . Smoking status: Former Smoker    Packs/day: 0.25    Quit date: 08/25/2016    Years since quitting: 2.7  . Smokeless tobacco: Never Used  Substance and Sexual Activity  . Alcohol use: Yes    Comment: "once a week"  . Drug use: No  . Sexual activity: Yes    Birth control/protection: None,  Post-menopausal

## 2019-06-04 NOTE — Progress Notes (Signed)
CARDIOLOGY CONSULT NOTE       Patient ID: Jasmine Allen MRN: [id number] DOB/AGE: 52-Jul-1967 53 y.o.  Admit date: (Not on file) Referring Physician: Lindi Adie Primary Physician: Nicholas Lose, MD Primary Cardiologist: New/Takirah Binford Reason for Consultation: Chest Pain   Active Problems:   * No active hospital problems. *   HPI:  54 y.o. seen by me in 2015 with atypical chest pain Had normal ETT at that time Seen in ER 12/16/18 with cough atypical chest pain and pneumonia Rx with one dose ceftriaxone in ER and 10 day course of doxycycline  Since Rx chest discomfort gradually improving but cough still bad at night and dyspnea. She quit smoking in 2018  Troponin's were minimally elevated 37/35 with no delta  ECG SR no acute changes ? Atrial enlargement CTA chest 12/09/18 no PE right middle and bilateral upper lobe pneumonia She has been Rx for breast cancer right lumpectomy 08/16/17 followed by systemic chemo including adriamycin , XRT and adjuvant antiestrogen Rx Echo 02/15/18 with EF 60-65% just mild MR and normal GLS -20  F/U CT chest ordered and uncovered newly diagnosed recurrent metastatic breast CA to lungs/bone and pelura Has had thoracentesis and Pleur X catheter December 2020. Getting oral chemo Palliative care has also seen    Feels much better Off oxygen Husband helps with catheter drainage has gone down   ROS All other systems reviewed and negative except as noted above  Past Medical History:  Diagnosis Date  . Alopecia, unspecified   . Anemia, unspecified    during her pregnancy  . Anxiety state, unspecified   . Breast cancer ([place]) 2019   Right Breast Cancer  . Bulging disc   . Depressive disorder, not elsewhere classified   . Diverticulosis of colon (without mention of hemorrhage)    CT Scan  . Dysrhythmia    PVCs in the past  . Esophageal reflux   . Esophagitis 1998  . Hiatal hernia 1998  . History of kidney stones   . Hypercholesteremia   . Hypertension   .  Migraine   . Opioid abuse, in remission [facility])     Family History  Problem Relation Age of Onset  . Heart disease Father        CABG  . Breast cancer Mother   . Breast cancer Maternal Grandmother   . Breast cancer Cousin   . Colon cancer Neg Hx   . Stomach cancer Neg Hx     Social History   Socioeconomic History  . Marital status: Married    Spouse name: Not on file  . Number of children: Not on file  . Years of education: Not on file  . Highest education level: Not on file  Occupational History  . Not on file  Tobacco Use  . Smoking status: Former Smoker    Packs/day: 0.25    Quit date: 08/25/2016    Years since quitting: 2.7  . Smokeless tobacco: Never Used  Substance and Sexual Activity  . Alcohol use: Yes    Comment: "once a week"  . Drug use: No  . Sexual activity: Yes    Birth control/protection: None, Post-menopausal  Other Topics Concern  . Not on file  Social History Narrative  . Not on file   Social Determinants of Health   Financial Resource Strain:   . Difficulty of Paying Living Expenses: Not on file  Food Insecurity:   . Worried About Charity fundraiser in the Last Year: Not on  file  . [place] in the Last Year: Not on file  Transportation Needs:   . Lack of Transportation (Medical): Not on file  . Lack of Transportation (Non-Medical): Not on file  Physical Activity:   . Days of Exercise per Week: Not on file  . Minutes of Exercise per Session: Not on file  Stress:   . Feeling of Stress : Not on file  Social Connections:   . Frequency of Communication with Friends and Family: Not on file  . Frequency of Social Gatherings with Friends and Family: Not on file  . Attends Religious Services: Not on file  . Active Member of Clubs or Organizations: Not on file  . Attends Archivist Meetings: Not on file  . Marital Status: Not on file  Intimate Partner Violence:   . Fear of Current or Ex-Partner: Not on file  . Emotionally Abused:  Not on file  . Physically Abused: Not on file  . Sexually Abused: Not on file    Past Surgical History:  Procedure Laterality Date  . ABLATION    . BREAST LUMPECTOMY Right 08/16/2017  . BREAST LUMPECTOMY WITH AXILLARY LYMPH NODE BIOPSY Right 08/16/2017   Procedure: RIGHT BREAST LUMPECTOMY WITH AXILLARY SENTINEL LYMPH NODE BIOPSY;  Surgeon: Rolm Bookbinder, MD;  Location: [place];  Service: General;  Laterality: Right;  . CARDIAC CATHETERIZATION  2011   normal coronaries  . KNEE SURGERY    . PORTACATH PLACEMENT Right 09/11/2017   Procedure: INSERTION PORT-A-CATH WITH ULTRASOUND;  Surgeon: Rolm Bookbinder, MD;  Location: [place];  Service: General;  Laterality: Right;  . RE-EXCISION OF BREAST LUMPECTOMY Right 09/11/2017   Procedure: RE-EXCISION OF BREAST LUMPECTOMY;  Surgeon: Rolm Bookbinder, MD;  Location: [place];  Service: General;  Laterality: Right;  . TONSILLECTOMY          Physical Exam: Blood pressure 132/64, pulse 77, height 5\' 1"  (1.549 m), weight 127 lb (57.6 kg), last menstrual period 12/24/2010, SpO2 96 %.   Affect appropriate Healthy:  appears stated age 33: normal Neck supple with no adenopathy JVP normal no bruits no thyromegaly Lungs abnormal RUL with Pleur X catheter in place  and good diaphragmatic motion Heart:  S1/S2 no murmur, no rub, gallop or click PMI normal Abdomen: benighn, BS positve, no tenderness, no AAA no bruit.  No HSM or HJR Distal pulses intact with no bruits No edema Neuro non-focal Skin warm and dry No muscular weakness   Labs:   Lab Results  Component Value Date   WBC 5.3 05/21/2019   HGB 10.5 (L) 05/21/2019   HCT 32.4 (L) 05/21/2019   MCV 108.0 (H) 05/21/2019   PLT 74 (L) 05/21/2019   No results for input(s): NA, K, CL, CO2, BUN, CREATININE, CALCIUM, PROT, BILITOT, ALKPHOS, ALT, AST, GLUCOSE in the last 168 hours.  Invalid input(s): LABALBU Lab Results  Component Value Date   CKTOTAL  65 02/16/2010   CKMB 1.1 02/16/2010   TROPONINI <0.03 04/05/2014    Lab Results  Component Value Date   CHOL  02/16/2010    162        ATP III CLASSIFICATION:  <200     mg/dL   Desirable  200-239  mg/dL   Borderline High  >=240    mg/dL   High          CHOL  02/16/2010    178        ATP III CLASSIFICATION:  <200  mg/dL   Desirable  200-239  mg/dL   Borderline High  >=240    mg/dL   High          Lab Results  Component Value Date   HDL 59 02/16/2010   HDL 64 02/16/2010   Lab Results  Component Value Date   LDLCALC  02/16/2010    86        Total Cholesterol/HDL:CHD Risk Coronary Heart Disease Risk Table                     Men   Women  1/2 Average Risk   3.4   3.3  Average Risk       5.0   4.4  2 X Average Risk   9.6   7.1  3 X Average Risk  23.4   11.0        Use the calculated Patient Ratio above and the CHD Risk Table to determine the patient's CHD Risk.        ATP III CLASSIFICATION (LDL):  <100     mg/dL   Optimal  100-129  mg/dL   Near or Above                    Optimal  130-159  mg/dL   Borderline  160-189  mg/dL   High  >190     mg/dL   Very High   LDLCALC (H) 02/16/2010    100        Total Cholesterol/HDL:CHD Risk Coronary Heart Disease Risk Table                     Men   Women  1/2 Average Risk   3.4   3.3  Average Risk       5.0   4.4  2 X Average Risk   9.6   7.1  3 X Average Risk  23.4   11.0        Use the calculated Patient Ratio above and the CHD Risk Table to determine the patient's CHD Risk.        ATP III CLASSIFICATION (LDL):  <100     mg/dL   Optimal  100-129  mg/dL   Near or Above                    Optimal  130-159  mg/dL   Borderline  160-189  mg/dL   High  >190     mg/dL   Very High   Lab Results  Component Value Date   TRIG 86 02/16/2010   TRIG 72 02/16/2010   Lab Results  Component Value Date   CHOLHDL 2.7 02/16/2010   CHOLHDL 2.8 02/16/2010   No results found for: LDLDIRECT    Radiology: DG Tibia/Fibula  Right  Result Date: 05/12/2019 CLINICAL DATA:  Right lower leg pain since a fall 2 days ago. EXAM: RIGHT TIBIA AND FIBULA - 2 VIEW COMPARISON:  Knee radiographs dated 10/22/2009 FINDINGS: There is a minimally displaced spiral fracture of the distal right fibular shaft. The tibia is intact. No dislocation. No appreciable joint effusions. No appreciable arthritic changes. IMPRESSION: Spiral fracture of the distal right fibular shaft. Electronically Signed   By: Lorriane Shire M.D.   On: 05/12/2019 11:04   XR Ankle Complete Right  Result Date: 06/03/2019 AP lateral mortise right ankle reviewed.  No change in fracture alignment of the lateral malleolus fracture.  Some interval healing  has occurred.  Mortise is symmetric.  XR Ankle Complete Right  Result Date: 05/24/2019 AP lateral merchant right ankle reviewed.  About 1 mm displacement has occurred since original radiographs from 10 days ago.  Mortise remains symmetric.  Lateral malleolar fracture has only minimal displacement seen on the AP and mortise view but not on the lateral view.  No significant shortening of the lateral malleolus relative to the medial malleolus.   EKG:  NSR normal 12/10/18   ASSESSMENT AND PLAN:   1. Chest pain atypical in setting of multilobar pneumonia normal ECG minimal elevation in troponin with no delta. Observe  2. Dyspnea:  CT 02/03/19 with malignant pleural effusion and likely lymphangitic spread cancer post thoracentesis see below 3. Breast Cancer :recurrent with mets to bone,lungs and pleura.  Pleur X catheter 03/11/19 on oral chemo f/u oncology   4. HLD  On statin labs with primary  5. Ortho:  Traumatic non displaced right ankle fracture 05/12/19 f/u Dr Marlou Sa in boot xray with spiral fracture of distal right fibular shaft   F/U with me in 6 months    Signed: Jenkins Rouge 06/09/2019, 11:43 AM

## 2019-06-06 ENCOUNTER — Other Ambulatory Visit: Payer: Self-pay | Admitting: Hematology and Oncology

## 2019-06-06 MED ORDER — MORPHINE SULFATE ER 15 MG PO TBCR: 15.0000 mg | EXTENDED_RELEASE_TABLET | Freq: Two times a day (BID) | ORAL | 0 refills | Status: DC

## 2019-06-09 ENCOUNTER — Ambulatory Visit (INDEPENDENT_AMBULATORY_CARE_PROVIDER_SITE_OTHER): Payer: 59 | Admitting: Cardiovascular Disease

## 2019-06-09 ENCOUNTER — Other Ambulatory Visit: Payer: Self-pay

## 2019-06-09 ENCOUNTER — Encounter: Payer: Self-pay | Admitting: Cardiovascular Disease

## 2019-06-09 VITALS — BP 132/64 | HR 77 | Ht 61.0 in | Wt 127.0 lb

## 2019-06-09 DIAGNOSIS — R0600 Dyspnea, unspecified: Secondary | ICD-10-CM

## 2019-06-09 DIAGNOSIS — R0609 Other forms of dyspnea: Secondary | ICD-10-CM

## 2019-06-09 NOTE — Patient Instructions (Addendum)

## 2019-06-13 ENCOUNTER — Other Ambulatory Visit: Payer: Self-pay | Admitting: Hematology and Oncology

## 2019-06-17 ENCOUNTER — Encounter: Payer: Self-pay | Admitting: Internal Medicine

## 2019-06-17 ENCOUNTER — Ambulatory Visit (INDEPENDENT_AMBULATORY_CARE_PROVIDER_SITE_OTHER): Payer: 59 | Admitting: Internal Medicine

## 2019-06-17 ENCOUNTER — Other Ambulatory Visit: Payer: Self-pay

## 2019-06-17 VITALS — BP 122/64 | HR 88 | Temp 97.0°F | Ht 60.0 in | Wt 122.0 lb

## 2019-06-17 DIAGNOSIS — J90 Pleural effusion, not elsewhere classified: Secondary | ICD-10-CM

## 2019-06-17 NOTE — Progress Notes (Signed)
Pulmonary Office Followup Note 05/24/19 Seen in f/u for malignant effusion  S: 54 year old woman with metastatic breast cancer with mets to bone, lungs, and R pleural space with recurrent malignant effusion.   S/P R pleurX 03/11/19 S/P L thoracentesis 04/29/19  Here with mother Doing well No issues with pleurX site Draining about 250cc twice weekly Broke ankle a month ago, slowly recovering from that Her stomach is swelling up a bit again Eating more, a little less active due to fatigue Due for repeat staging scans later this month  ROS + symptoms in bold Fevers, chills, weight loss Nausea, vomiting, diarrhea Shortness of breath, wheezing, cough Chest pain, palpitations, lower ext edema   O: Today's Vitals   06/17/19 1325  BP: 122/64  Pulse: 88  Temp: (!) 97 F (36.1 C)  TempSrc: Temporal  SpO2: 99%  Weight: 122 lb (55.3 kg)  Height: 5' (1.524 m)   Body mass index is 23.83 kg/m. On 3L  GEN: middle aged woman in NAD HEENT: no thrush, RRR CV: RRR, ext warm PULM: Diminished bilateral bases, very small effusions BL with [place] GI: Soft, +BS, more distended but minimal ascitic fluid on [place] mostly subcutaneous EXT: LE edema trivial NEURO: Moves all 4 ext to command PSYCH: AOx3, excellent insight  SKIN: PleurX site dressed   A: # Recurrent malignant R effusion s/p PleurX 12/1 with entrapment by symptoms # Metastatic breast cancer on letrozole,Ibrance, and faslodex # Pain due to bone and nerve infiltration of tumor # Chemo induced pancytopenia # Malignant L effusion drained 04/29/19 # Ascites and anasarca due to protein calorie malnutrition, path neg; improved # Ankle fracture 05/12/19 nonoperatively managed, unclear whether she should still be wearing boot  P: -Continue Monday-Friday drainage -Continue protein supplementation -Once drops off < 175cc on 2-3 consecutive drains, patient will call and we will remove pleurX  MDM . I reviewed prior external note(s) from  NP Causey (Heme/Onc) on 05/21/19 . I reviewed the result(s) of pathology from 04/29/19 ascites (neg) and left pleural fluid (positive)   Erskine Emery MD

## 2019-06-17 NOTE — Patient Instructions (Signed)
Call when drainage is less than 175cc's (each) on three consecutive drains then we can take out catheter.  Let me know if stomach gets too large and you think fluid building back up.

## 2019-06-18 ENCOUNTER — Ambulatory Visit: Payer: 59 | Admitting: Hematology and Oncology

## 2019-06-18 ENCOUNTER — Other Ambulatory Visit: Payer: 59

## 2019-06-18 ENCOUNTER — Telehealth: Payer: Self-pay | Admitting: Orthopedic Surgery

## 2019-06-18 ENCOUNTER — Ambulatory Visit: Payer: 59

## 2019-06-18 NOTE — Progress Notes (Signed)
Patient Care Team: Nicholas Lose, MD as PCP - General (Hematology and Oncology) Nicholas Lose, MD as Consulting Physician (Hematology and Oncology) Kyung Rudd, MD as Consulting Physician (Radiation Oncology) Rolm Bookbinder, MD as Consulting Physician (General Surgery) Julianne Handler, NP as Nurse Practitioner (Hospice and Palliative Medicine)  DIAGNOSIS:    ICD-10-CM   1. Malignant neoplasm of lower-inner quadrant of right breast of female, estrogen receptor positive ([place])  C50.311    Z17.0     SUMMARY OF ONCOLOGIC HISTORY: Oncology History  Malignant neoplasm of lower-inner quadrant of right breast of female, estrogen receptor positive ([place])  08/16/2017 Initial Diagnosis   Right lumpectomy: 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  High risk   08/16/2017 Cancer Staging   Staging form: Breast, AJCC 8th Edition - Pathologic stage from 08/16/2017: Stage IA (pT1c, pN0, cM0, G2, ER+, PR+, HER2-, Oncotype DX score: 31) - Signed by Gardenia Phlegm, NP on 09/12/2018   09/25/2017 - 02/05/2018 Chemotherapy   Adjuvant chemotherapy with dose dense Adriamycin and Cytoxan x4 followed by Taxol weekly x12    03/11/2018 - 04/25/2018 Radiation Therapy   Adjuvant XRT   01/09/2019 PET scan   Pulmonary hypermetabolic and consistent with lymphangitic tumor spread, confluent right middle lobe consolidation, hypermetabolic abdominal, cervical and left axillary lymph nodes, diffuse liver hypermetabolic some nonspecific.  Diffuse bone marrow hypermetabolism worrisome for metastatic disease.  Prior pericardial effusion resolved.   01/16/2019 Relapse/Recurrence   Pleural effusion: Thoracentesis revealed metastatic adenocarcinoma breast primary ER 75%, PR 50%, HER-2  -1+ Bone marrow biopsy positive for metastatic breast cancer     CHIEF COMPLIANT: Follow-up of metastatic breast cancer on Ibrance  INTERVAL HISTORY: Jasmine Allen is a 54 y.o. with above-mentioned history of metastatic breast cancer with pleural effusions currently on Ibrance and letrozole with Faslodex and Xgeva. She presents to the clinic today for a toxicity check.   Her breathing is much improved.  She is no longer using oxygen.  She wants to keep the oxygen as emergency backup in case she needs it.  The pleural effusion from the chest tube is not significant.  It might be removed very soon.  Energy levels are getting better.  She is eating better.  However she is also eating a lot of unnecessary carbohydrates.  She has gained some weight as well.  ALLERGIES:  is allergic to amoxicillin-pot clavulanate; erythromycin; metoclopramide hcl; and sulfonamide derivatives.  MEDICATIONS:  Current Outpatient Medications  Medication Sig Dispense Refill  . ALPRAZolam (XANAX) 0.25 MG tablet Take 1 tablet (0.25 mg total) by mouth at bedtime as needed for anxiety. Takes one dose at bedtime for insomnia 30 tablet 3  . Biotin 10000 MCG TABS Take 10,000 mcg by mouth daily.     . bumetanide (BUMEX) 1 MG tablet TAKE 1 TABLET(1 MG) BY MOUTH TWICE DAILY 60 tablet 2  . chlorpheniramine-HYDROcodone (TUSSIONEX PENNKINETIC ER) 10-8 MG/5ML SUER Take 5 mLs by mouth 2 (two) times daily. 140 mL 0  . cyclobenzaprine (FLEXERIL) 10 MG tablet Take 10 mg by mouth every 8 (eight) hours as needed for muscle spasms.  1  . dexamethasone (DECADRON) 1 MG tablet TAKE 1 TABLET(1 MG) BY MOUTH DAILY WITH BREAKFAST 30 tablet 0  . diphenoxylate-atropine (  LOMOTIL) 2.5-0.025 MG tablet Take 1 tablet by mouth 4 (four) times daily as needed for diarrhea or loose stools. 30 tablet 1  . hydrOXYzine (ATARAX/VISTARIL) 10 MG tablet Take 10-30 mg by mouth at bedtime as needed for itching.   0  . letrozole (FEMARA) 2.5 MG tablet Take 1 tablet  (2.5 mg total) by mouth daily. 90 tablet 3  . LORazepam (ATIVAN) 0.5 MG tablet Take 1 tablet (0.5 mg total) by mouth every 8 (eight) hours. (Patient taking differently: Take 0.5 mg by mouth every 8 (eight) hours. Taking qhs for insomnia) 30 tablet 3  . morphine (MS CONTIN) 15 MG 12 hr tablet Take 1 tablet (15 mg total) by mouth every 12 (twelve) hours. 60 tablet 0  . morphine (MSIR) 15 MG tablet Take 1 tablet (15 mg total) by mouth every 6 (six) hours as needed for severe pain. 60 tablet 0  . Multiple Vitamin (MULITIVITAMIN WITH MINERALS) TABS Take 1 tablet by mouth daily.    . ondansetron (ZOFRAN ODT) 4 MG disintegrating tablet Take 1 tablet (4 mg total) by mouth every 8 (eight) hours as needed for nausea or vomiting. 20 tablet 0  . palbociclib (IBRANCE) 75 MG tablet Take 75 mg by mouth daily. Take for 21 days on, 7 days off, repeat every 28 days.    . pantoprazole (PROTONIX) 40 MG tablet Take 1 tablet (40 mg total) by mouth 2 (two) times daily before a meal. (Patient taking differently: Take 40 mg by mouth 2 (two) times daily. ) 60 tablet 6  . polyethylene glycol (MIRALAX / GLYCOLAX) 17 g packet Take 17 g by mouth 2 (two) times daily. Decrease to daily if having watery or multiple loose stools daily. 30 each 0  . potassium chloride SA (KLOR-CON) 20 MEQ tablet Take 2 tablets (40 mEq total) by mouth daily. TAKE 1 TABLET(20 MEQ) BY MOUTH DAILY (Patient taking differently: Take 40 mEq by mouth daily. Currently taking bid) 60 tablet 6  . prochlorperazine (COMPAZINE) 10 MG tablet Take 1 tablet (10 mg total) by mouth every 6 (six) hours as needed for nausea or vomiting. 30 tablet 0  . senna (SENOKOT) 8.6 MG tablet Take by mouth.    . sertraline (ZOLOFT) 25 MG tablet Take 1 tablet (25 mg total) by mouth daily. 30 tablet 3  . traZODone (DESYREL) 100 MG tablet Take 150 mg by mouth at bedtime.     . valACYclovir (VALTREX) 1000 MG tablet Take by mouth. Prn fever blisters     No current facility-administered  medications for this visit.    PHYSICAL EXAMINATION: ECOG PERFORMANCE STATUS: 1 - Symptomatic but completely ambulatory  Vitals:   06/19/19 1048  BP: 138/85  Pulse: 77  Resp: 18  Temp: 98.2 F (36.8 C)  SpO2: 100%   Filed Weights   06/19/19 1048  Weight: 122 lb 6.4 oz (55.5 kg)    LABORATORY DATA:  I have reviewed the data as listed CMP Latest Ref Rng & Units 05/21/2019 04/21/2019 04/08/2019  Glucose 70 - 99 mg/dL 131(H) 206(H) 128(H)  BUN 6 - 20 mg/dL 15 13 5(L)  Creatinine 0.44 - 1.00 mg/dL 0.62 0.66 0.61  Sodium 135 - 145 mmol/L 138 137 137  Potassium 3.5 - 5.1 mmol/L 3.5 3.9 3.1(L)  Chloride 98 - 111 mmol/L 99 105 101  CO2 22 - 32 mmol/L 29 25 27   Calcium 8.9 - 10.3 mg/dL 8.1(L) 7.4(L) 7.5(L)  Total Protein 6.5 - 8.1 g/dL 6.2(L) 5.4(L) 5.5(L)  Total Bilirubin  0.3 - 1.2 mg/dL 1.3(H) 1.0 1.6(H)  Alkaline Phos 38 - 126 U/L 224(H) 188(H) 213(H)  AST 15 - 41 U/L 73(H) 66(H) 67(H)  ALT 0 - 44 U/L 67(H) 32 35    Lab Results  Component Value Date   WBC 5.3 06/19/2019   HGB 11.9 (L) 06/19/2019   HCT 36.1 06/19/2019   MCV 110.1 (H) 06/19/2019   PLT 81 (L) 06/19/2019   NEUTROABS 3.8 06/19/2019    ASSESSMENT & PLAN:  Malignant neoplasm of lower-inner quadrant of right breast of female, estrogen receptor positive ([facility]) 08/16/2017:Right lumpectomy: Grade 2 IDC 1.5 cm, with DCIS and necrosis, 0/3 lymph nodes negative, ER 95%, PR 30%, HER-2 negative ratio 1.14, Ki-67 40%, T1CN0 stage Ia; resection of the margin 09/11/2017: Benign  01/09/2019: PET CT scan:Pulmonary hypermetabolic and consistent with lymphangitic tumor spread, confluent right middle lobe consolidation, hypermetabolic abdominal, cervical and left axillary lymph nodes, diffuse liver hypermetabolic some nonspecific. Diffuse bone marrow hypermetabolism worrisome for metastatic disease. Prior pericardial effusion resolved.  01/16/2019:Pleural effusion: Thoracentesis revealed metastatic adenocarcinoma breast primary ER  75%, PR 50%, HER-2 -1+ Bone marrow biopsy positive for metastatic breast cancer  [facility] molecular testing: ER/PR positive HER-2 negative, ER positive, TMB low (1), BRCA 1 and 2 -, ESR 1 mutation not detected, PI K3 CA negative Guardant 360: T p53, no evidence of MSI high -------------------------------------------------------------------------------------------------------------------------------------------------------------- Current treatment: Letrozole 2.5 mg daily started 01/15/2019, Ibrance started 01/24/2019, Faslodex and Xgeva added 02/21/2019 Hospitalization: 02/03/19- 02/07/19 Malignant Pleural effusion S/P thoracentesisstatus post Pleurx catheter placement 03/11/2019  Plan: 1.Faslodex added to the treatment11/13/2020 2. Ibrance 75 mg, currently on 3 weeks on 2 weeks off regimen 3.Xgeva q 3 months   Patient had a remarkable turnaround in her performance status.  She is now able to walk without any assistance.  She can breathe without oxygen.  The chest tube is also likely to come out in the next month. The blood counts have markedly improved. Elevated blood sugars: I discussed with her to cut down the dexamethasone slowly and discontinue it.  She is set up for a CT scan on 07/04/2019. I will call her after that to go over the results. She will return in 4 weeks for labs, f/u with me, and her next injection    No orders of the defined types were placed in this encounter.  The patient has a good understanding of the overall plan. she agrees with it. she will call with any problems that may develop before the next visit here.  Total time spent: 30 mins including face to face time and time spent for planning, charting and coordination of care  Nicholas Lose, MD 06/19/2019  I, Cloyde Reams Dorshimer, am acting as scribe for Dr. Nicholas Lose.  I have reviewed the above documentation for accuracy and completeness, and I agree with the above.

## 2019-06-18 NOTE — Telephone Encounter (Signed)
Patient called.  Her cancer medicine messes with her memory and she wanted to check in advance to see if she could be escorted back to her appointment by one of her family members,  Call back: [phone]

## 2019-06-19 ENCOUNTER — Inpatient Hospital Stay (HOSPITAL_BASED_OUTPATIENT_CLINIC_OR_DEPARTMENT_OTHER): Payer: 59 | Admitting: Hematology and Oncology

## 2019-06-19 ENCOUNTER — Inpatient Hospital Stay: Payer: 59

## 2019-06-19 ENCOUNTER — Inpatient Hospital Stay: Payer: 59 | Attending: Adult Health

## 2019-06-19 ENCOUNTER — Other Ambulatory Visit: Payer: Self-pay

## 2019-06-19 VITALS — BP 140/80 | HR 77 | Temp 98.2°F | Resp 16

## 2019-06-19 DIAGNOSIS — C7951 Secondary malignant neoplasm of bone: Secondary | ICD-10-CM | POA: Diagnosis present

## 2019-06-19 DIAGNOSIS — Z170 Estrogen receptor positive status [ER+]: Secondary | ICD-10-CM

## 2019-06-19 DIAGNOSIS — Z79899 Other long term (current) drug therapy: Secondary | ICD-10-CM | POA: Diagnosis not present

## 2019-06-19 DIAGNOSIS — R739 Hyperglycemia, unspecified: Secondary | ICD-10-CM | POA: Insufficient documentation

## 2019-06-19 DIAGNOSIS — C773 Secondary and unspecified malignant neoplasm of axilla and upper limb lymph nodes: Secondary | ICD-10-CM | POA: Insufficient documentation

## 2019-06-19 DIAGNOSIS — Z95828 Presence of other vascular implants and grafts: Secondary | ICD-10-CM

## 2019-06-19 DIAGNOSIS — Z7952 Long term (current) use of systemic steroids: Secondary | ICD-10-CM | POA: Insufficient documentation

## 2019-06-19 DIAGNOSIS — Z9221 Personal history of antineoplastic chemotherapy: Secondary | ICD-10-CM | POA: Insufficient documentation

## 2019-06-19 DIAGNOSIS — C50311 Malignant neoplasm of lower-inner quadrant of right female breast: Secondary | ICD-10-CM

## 2019-06-19 DIAGNOSIS — Z79811 Long term (current) use of aromatase inhibitors: Secondary | ICD-10-CM | POA: Diagnosis not present

## 2019-06-19 DIAGNOSIS — Z923 Personal history of irradiation: Secondary | ICD-10-CM | POA: Insufficient documentation

## 2019-06-19 LAB — CMP (CANCER CENTER ONLY)
ALT: 46 U/L — ABNORMAL HIGH (ref 0–44)
AST: 68 U/L — ABNORMAL HIGH (ref 15–41)
Albumin: 2.6 g/dL — ABNORMAL LOW (ref 3.5–5.0)
Alkaline Phosphatase: 252 U/L — ABNORMAL HIGH (ref 38–126)
Anion gap: 7 (ref 5–15)
BUN: 9 mg/dL (ref 6–20)
CO2: 29 mmol/L (ref 22–32)
Calcium: 7.8 mg/dL — ABNORMAL LOW (ref 8.9–10.3)
Chloride: 103 mmol/L (ref 98–111)
Creatinine: 0.62 mg/dL (ref 0.44–1.00)
GFR, Est AFR Am: >60 mL/min (ref >60)
GFR, Estimated: >60 mL/min (ref >60)
Glucose, Bld: 190 mg/dL — ABNORMAL HIGH (ref 70–99)
Potassium: 3.6 mmol/L (ref 3.5–5.1)
Sodium: 139 mmol/L (ref 135–145)
Total Bilirubin: 0.9 mg/dL (ref 0.3–1.2)
Total Protein: 5.8 g/dL — ABNORMAL LOW (ref 6.5–8.1)

## 2019-06-19 LAB — CBC WITH DIFFERENTIAL (CANCER CENTER ONLY)
Abs Immature Granulocytes: 0.03 10*3/uL (ref 0.00–0.07)
Basophils Absolute: 0 10*3/uL (ref 0.0–0.1)
Basophils Relative: 1 %
Eosinophils Absolute: 0 10*3/uL (ref 0.0–0.5)
Eosinophils Relative: 1 %
HCT: 36.1 % (ref 36.0–46.0)
Hemoglobin: 11.9 g/dL — ABNORMAL LOW (ref 12.0–15.0)
Immature Granulocytes: 1 %
Lymphocytes Relative: 14 %
Lymphs Abs: 0.8 10*3/uL (ref 0.7–4.0)
MCH: 36.3 pg — ABNORMAL HIGH (ref 26.0–34.0)
MCHC: 33 g/dL (ref 30.0–36.0)
MCV: 110.1 fL — ABNORMAL HIGH (ref 80.0–100.0)
Monocytes Absolute: 0.6 10*3/uL (ref 0.1–1.0)
Monocytes Relative: 11 %
Neutro Abs: 3.8 10*3/uL (ref 1.7–7.7)
Neutrophils Relative %: 72 %
Platelet Count: 81 10*3/uL — ABNORMAL LOW (ref 150–400)
RBC: 3.28 MIL/uL — ABNORMAL LOW (ref 3.87–5.11)
RDW: 18.9 % — ABNORMAL HIGH (ref 11.5–15.5)
WBC Count: 5.3 10*3/uL (ref 4.0–10.5)
nRBC: 0.4 % — ABNORMAL HIGH (ref 0.0–0.2)

## 2019-06-19 LAB — SAMPLE TO BLOOD BANK

## 2019-06-19 MED ORDER — FULVESTRANT 250 MG/5ML IM SOLN
INTRAMUSCULAR | Status: AC
  Filled 2019-06-19: qty 10

## 2019-06-19 MED ORDER — FULVESTRANT 250 MG/5ML IM SOLN
500.0000 mg | Freq: Once | INTRAMUSCULAR | Status: AC
  Administered 2019-06-19: 500 mg via INTRAMUSCULAR

## 2019-06-19 NOTE — Assessment & Plan Note (Signed)
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 Diffuse bone marrow hypermetabolism worrisome for metastatic disease. Prior pericardial effusion resolved.  01/16/2019:Pleural effusion: Thoracentesis revealed metastatic adenocarcinoma breast primary ER 75%, PR 50%, HER-2 -1+ Bone marrow biopsy positive for metastatic breast cancer  [facility] molecular testing: ER/PR positive HER-2 negative, ER positive, TMB low (1), BRCA 1 and 2 -, ESR 1 mutation not detected, PI K3 CA negative Guardant 360: T p53, no evidence of MSI high -------------------------------------------------------------------------------------------------------------------------------------------------------------- Current treatment: Letrozole 2.5 mg daily started 01/15/2019, Ibrance started 01/24/2019, Faslodex and Xgeva added 02/21/2019 Hospitalization: 02/03/19- 02/07/19 Malignant Pleural effusion S/P thoracentesisstatus post Pleurx catheter placement 03/11/2019  Plan: 1.Faslodex added to the treatment11/13/2020 2. Ibrance 75 mg, currently on 3 weeks on 2 weeks off regimen 3.Xgeva q 3 months    She will return in 4 weeks for labs, f/u with me, and her next injection

## 2019-06-19 NOTE — Patient Instructions (Signed)
Fulvestrant injection What is this medicine? FULVESTRANT (ful VES trant) blocks the effects of estrogen. It is used to treat breast cancer. This medicine may be used for other purposes; ask your health care provider or pharmacist if you have questions. COMMON BRAND NAME(S): FASLODEX What should I tell my health care provider before I take this medicine? They need to know if you have any of these conditions:  bleeding disorders  liver disease  low blood counts, like low white cell, platelet, or red cell counts  an unusual or allergic reaction to fulvestrant, other medicines, foods, dyes, or preservatives  pregnant or trying to get pregnant  breast-feeding How should I use this medicine? This medicine is for injection into a muscle. It is usually given by a health care professional in a hospital or clinic setting. Talk to your pediatrician regarding the use of this medicine in children. Special care may be needed. Overdosage: If you think you have taken too much of this medicine contact a poison control center or emergency room at once. NOTE: This medicine is only for you. Do not share this medicine with others. What if I miss a dose? It is important not to miss your dose. Call your doctor or health care professional if you are unable to keep an appointment. What may interact with this medicine?  medicines that treat or prevent blood clots like warfarin, enoxaparin, dalteparin, apixaban, dabigatran, and rivaroxaban This list may not describe all possible interactions. Give your health care provider a list of all the medicines, herbs, non-prescription drugs, or dietary supplements you use. Also tell them if you smoke, drink alcohol, or use illegal drugs. Some items may interact with your medicine. What should I watch for while using this medicine? Your condition will be monitored carefully while you are receiving this medicine. You will need important blood work done while you are taking  this medicine. Do not become pregnant while taking this medicine or for at least 1 year after stopping it. Women of child-bearing potential will need to have a negative pregnancy test before starting this medicine. Women should inform their doctor if they wish to become pregnant or think they might be pregnant. There is a potential for serious side effects to an unborn child. Men should inform their doctors if they wish to father a child. This medicine may lower sperm counts. Talk to your health care professional or pharmacist for more information. Do not breast-feed an infant while taking this medicine or for 1 year after the last dose. What side effects may I notice from receiving this medicine? Side effects that you should report to your doctor or health care professional as soon as possible:  allergic reactions like skin rash, itching or hives, swelling of the face, lips, or tongue  feeling faint or lightheaded, falls  pain, tingling, numbness, or weakness in the legs  signs and symptoms of infection like fever or chills; cough; flu-like symptoms; sore throat  vaginal bleeding Side effects that usually do not require medical attention (report to your doctor or health care professional if they continue or are bothersome):  aches, pains  constipation  diarrhea  headache  hot flashes  nausea, vomiting  pain at site where injected  stomach pain This list may not describe all possible side effects. Call your doctor for medical advice about side effects. You may report side effects to FDA at 1-800-FDA-1088. Where should I keep my medicine? This drug is given in a hospital or clinic and will   not be stored at home. NOTE: This sheet is a summary. It may not cover all possible information. If you have questions about this medicine, talk to your doctor, pharmacist, or health care provider.  2020 Elsevier/Gold Standard (2017-07-05 11:34:41)  

## 2019-06-19 NOTE — Telephone Encounter (Signed)
IC advised ok per Dr Marlou Sa.

## 2019-06-23 ENCOUNTER — Other Ambulatory Visit: Payer: Self-pay

## 2019-06-23 ENCOUNTER — Ambulatory Visit (INDEPENDENT_AMBULATORY_CARE_PROVIDER_SITE_OTHER): Payer: 59 | Admitting: Orthopedic Surgery

## 2019-06-23 ENCOUNTER — Ambulatory Visit (INDEPENDENT_AMBULATORY_CARE_PROVIDER_SITE_OTHER): Payer: 59

## 2019-06-23 DIAGNOSIS — S82831D Other fracture of upper and lower end of right fibula, subsequent encounter for closed fracture with routine healing: Secondary | ICD-10-CM

## 2019-06-24 ENCOUNTER — Telehealth: Payer: Self-pay | Admitting: Internal Medicine

## 2019-06-24 ENCOUNTER — Other Ambulatory Visit: Payer: 59 | Admitting: Internal Medicine

## 2019-06-24 ENCOUNTER — Encounter: Payer: Self-pay | Admitting: Internal Medicine

## 2019-06-24 DIAGNOSIS — Z515 Encounter for palliative care: Secondary | ICD-10-CM

## 2019-06-24 DIAGNOSIS — Z7189 Other specified counseling: Secondary | ICD-10-CM

## 2019-06-24 NOTE — Telephone Encounter (Signed)
Agree with proceeding forward with home health nurse evaluation today.  If there is any acute abnormalities as by home health we need to be notified.  As always patient is always more than welcome to present to our office for an in person evaluation with an APP or Dr. Tamala Julian.I believe the best neck steps would be simply getting evaluated with home health.  If the patient is going to need management of pleuritic pain oncology can work on prescribing pain medications for her chronically.  Will route to Dr. Tamala Julian as Juluis Rainier. Wyn Quaker, FNP

## 2019-06-24 NOTE — Telephone Encounter (Signed)
Spoke with pt, aware of recs.  States home health nurse came by this afternoon and said that site looked ok, had advised to keep an eye on it over the next 1-2 days.  Pt states she will contact our office if site does not improve/worsen.  Nothing further needed at this time- will close encounter.

## 2019-06-24 NOTE — Progress Notes (Signed)
March 16th, 2021 [facility] Consult Note Telephone: [phone]  Fax: [phone]   PATIENT NAME: Jasmine Allen DOB: 05/08/1965 MRN: [id number] [address] [place] [address] *([phone]   PRIMARY CARE PROVIDER:   Nicholas Lose, MD (LOV 04/21/19)  Dr. Candee Furbish [facility])   REFERRING PROVIDER:  Aletha Halim., PA-C [address]  [place] [address]   RESPONSIBLE PARTY:  Spouse [name] ASSESSMENT / RECOMMENDATIONS:  1. Advance Care Planning:             A. Directives: DNR and MOST present in home/[facility] EMR. MOST details: DNR/DNI, Limited Scope of Medical Interventions, yes to IVFs and Antibiotics. No to Tube Feeding.              B. Goals of Care: Eventual R Chest tube removal. Feeling positive and hopeful. Celebrating family events (boys in [employer]; son accepted Surveyor, minerals).    2. Symptom Management:             A. Fatigue d/t illness/deconditioning/opioids: Today forgetful and mild, intermittent confusion. Taking a little more time to process her thoughts. She attributes this to expected side effects of chemo regimen. She's healed from her R ankle fracture and is transferring and ambulating up and about without use of assistive devices. She's thinking of starting a low impact exercise regimen when she a little further out from her ankle injury. Her energy level and endurance are much improved, and she had been taking shopping trips with her mom.                B. Prior decreased appetite: Her appetite is much improved, attributable to initiation of steroids a few months back. Dr. Madolyn Frieze has asked her to wean her Decadron to d/c (elevated blood sugars); she is currently on  tab/day. Her current weight is 122 lbs, which is up 7 lbs from a month ago. She is 5'1", with BMI 23.9kg/m2.  I'm thinking some of this gain is d/t fluid; she has L > R LLE (L pitting more so, to  mid calf), and some abdominal swelling. She's on Bumex 1 mg bid.   -I asked her to weigh herself qd; check with PCP if weight gain >3lbs/3days; may need to increase diuretic.              C. Pain: She has had resolution of her R flank, R upper abdominal, under R rib, and anterior chest pain, but yesterday developed new R mid axillary pain. Occurs when ambulating and if twists torso to R side. Continues MS Contin 15mg  bid, with MSO4 IR 15mg  which she takes (along with a Tylenol) at bedtime so that she isn't awoken with pain. She has a call to her 42 office to update. Her chest tube is on that side, and the site looks good without inflammation or redness (she showed me a pic that was taken yesterday). Discomfort seems muscular.                                       D. Potential for opioid induced constipation: she is having regular, normal bowel movements and has not has to utilize her Senokot.                E. Dyspnea form lung cancer/malignant pleural effusion: Continues R PleurX  CT present since 03/12/2019. She drains this herself on Mondays and Fridays; amounts vary from 62m to 324ml. Per patient, her pulmonologist would like to see drainage to be about 58ml before removing tube. She ambulates about the home without dyspnea. No longer needing oxygen.               F. Coping setting of life-threatening illness/situational and prior h/o depression: Patient is in good spirts today; she's both excited yet nervous regarding upcoming CT for staging, scheduled for March 26th. She's frustrated about her bouts of confusion and forgetfulness. She feels her family is coping with her illness fairly well. We've discussed [facility], during prior visits.   (form previous note) Patient's husband is super supportive, and they share their hopes and concerns. They are trying to maintain a normal routine for the kids. Patient copes by talking with her close girlfriend network. She has a best / childhood friend  Jasmine Allen who visits regularly and is a huge source of support. Also, patient mom, who lives close by, is now visiting daily. Patient is gratified that she and her mom have reconnected from a previously strained relationship. Overall, patient is humbled and grateful for the support her family and friends have provided.                G. Long h/o insomnia treated with hs routine of Xanax 0.25mg , Ativan 0.5mg , Trazadone 100mg , and Vistaril 10mg  qhs.     3. Follow up Palliative Care Visit: Fri May 7th at noon   I spent 60 minutes providing this consultation from noon to 1pm. More than 50% of the time in this consultation was spent coordinating communication.    HISTORY OF PRESENT ILLNESS:  Jasmine Allen is a 54 year old woman with metastatic L breast cancer with mets to bone, lungs, liver, and R pleural space with recurrent malignant effusion (R PleurX; recent L thoracentesis . Dx May 2019. (past XRT, current Letrozole, Ibrance, Faslodex). PleurX 03/11/2019 R. CT chest reveals positive response to chemo (regression of lung changes and stable liver and bone lesions. 05/10/2019 Fx or R lateral ankle Malleolar fx (1-58mm lateral displacement).   Palliative Care was asked to help address goals of care.    This is a f/u Palliative Care visit from 2/15//2021.    CODE STATUS: DNR   PPS: weak 60%   HOSPICE ELIGIBILITY/DIAGNOSIS: yes pending completion of aggressive treatment for cancer  PAST MEDICAL HISTORY:  Past Medical History:  Diagnosis Date  . Alopecia, unspecified   . Anemia, unspecified    during her pregnancy  . Anxiety state, unspecified   . Breast cancer ([place]) 2019   Right Breast Cancer  . Bulging disc   . Depressive disorder, not elsewhere classified   . Diverticulosis of colon (without mention of hemorrhage)    CT Scan  . Dysrhythmia    PVCs in the past  . Esophageal reflux   . Esophagitis 1998  . Hiatal hernia 1998  . History of kidney stones   . Hypercholesteremia   .  Hypertension   . Migraine   . Opioid abuse, in remission [facility]     SOCIAL HX:  Social History   Tobacco Use  . Smoking status: Former Smoker    Packs/day: 0.25    Quit date: 08/25/2016    Years since quitting: 2.8  . Smokeless tobacco: Never Used  Substance Use Topics  . Alcohol use: Yes    Comment: "once a week"  ALLERGIES:  Allergies  Allergen Reactions  . Amoxicillin-Pot Clavulanate Other (See Comments)    Big doses cause diarrhea   . Erythromycin Rash  . Metoclopramide Hcl Other (See Comments)    Restless legs  . Sulfonamide Derivatives Rash     PERTINENT MEDICATIONS:  Outpatient Encounter Medications as of 06/24/2019  Medication Sig  . ALPRAZolam (XANAX) 0.25 MG tablet Take 1 tablet (0.25 mg total) by mouth at bedtime as needed for anxiety. Takes one dose at bedtime for insomnia  . Biotin 10000 MCG TABS Take 10,000 mcg by mouth daily.   . bumetanide (BUMEX) 1 MG tablet TAKE 1 TABLET(1 MG) BY MOUTH TWICE DAILY  . chlorpheniramine-HYDROcodone (TUSSIONEX PENNKINETIC ER) 10-8 MG/5ML SUER Take 5 mLs by mouth 2 (two) times daily.  . cyclobenzaprine (FLEXERIL) 10 MG tablet Take 10 mg by mouth every 8 (eight) hours as needed for muscle spasms.  Marland Kitchen dexamethasone (DECADRON) 1 MG tablet TAKE 1 TABLET(1 MG) BY MOUTH DAILY WITH BREAKFAST  . diphenoxylate-atropine (LOMOTIL) 2.5-0.025 MG tablet Take 1 tablet by mouth 4 (four) times daily as needed for diarrhea or loose stools.  . hydrOXYzine (ATARAX/VISTARIL) 10 MG tablet Take 10-30 mg by mouth at bedtime as needed for itching.   . letrozole (FEMARA) 2.5 MG tablet Take 1 tablet (2.5 mg total) by mouth daily.  Marland Kitchen LORazepam (ATIVAN) 0.5 MG tablet Take 1 tablet (0.5 mg total) by mouth every 8 (eight) hours. (Patient taking differently: Take 0.5 mg by mouth every 8 (eight) hours. Taking qhs for insomnia)  . morphine (MS CONTIN) 15 MG 12 hr tablet Take 1 tablet (15 mg total) by mouth every 12 (twelve) hours.  Marland Kitchen morphine (MSIR) 15 MG tablet  Take 1 tablet (15 mg total) by mouth every 6 (six) hours as needed for severe pain.  . Multiple Vitamin (MULITIVITAMIN WITH MINERALS) TABS Take 1 tablet by mouth daily.  . ondansetron (ZOFRAN ODT) 4 MG disintegrating tablet Take 1 tablet (4 mg total) by mouth every 8 (eight) hours as needed for nausea or vomiting.  . palbociclib (IBRANCE) 75 MG tablet Take 75 mg by mouth daily. Take for 21 days on, 7 days off, repeat every 28 days.  . pantoprazole (PROTONIX) 40 MG tablet Take 1 tablet (40 mg total) by mouth 2 (two) times daily before a meal. (Patient taking differently: Take 40 mg by mouth 2 (two) times daily. )  . polyethylene glycol (MIRALAX / GLYCOLAX) 17 g packet Take 17 g by mouth 2 (two) times daily. Decrease to daily if having watery or multiple loose stools daily.  . potassium chloride SA (KLOR-CON) 20 MEQ tablet Take 2 tablets (40 mEq total) by mouth daily. TAKE 1 TABLET(20 MEQ) BY MOUTH DAILY (Patient taking differently: Take 40 mEq by mouth daily. Currently taking bid)  . prochlorperazine (COMPAZINE) 10 MG tablet Take 1 tablet (10 mg total) by mouth every 6 (six) hours as needed for nausea or vomiting.  . senna (SENOKOT) 8.6 MG tablet Take by mouth.  . sertraline (ZOLOFT) 25 MG tablet Take 1 tablet (25 mg total) by mouth daily.  . traZODone (DESYREL) 100 MG tablet Take 150 mg by mouth at bedtime.   . valACYclovir (VALTREX) 1000 MG tablet Take by mouth. Prn fever blisters   No facility-administered encounter medications on file as of 06/24/2019.    PHYSICAL EXAM:   BP 120/78, HR 88, RR 16  General: NAD, well nourished. Forgetful, mild confusion. Husband in and out.  Chest: R lower insp harsh site of  chest tube), otherwise clear Extremities: L>R LE edema to mid calf Abd: soft, distended, NABS, non tender  Skin: no rashes Neurological: Weakness but otherwise non-focal  Julianne Handler, NP

## 2019-06-24 NOTE — Telephone Encounter (Signed)
Called and spoke with Patient.  Patient stated she is having pain at her pleurx site. Patient stated yesterday when it was drained,some reddish tint was noticed.  Patient stated she has a nurse coming today to check on her, after 12.  Advised Patient to let nurse look at pleurx, and site. Patient stated she does not see any changes, redness, or swelling around site.  Patient described pain as off and on constant pain, rated "5" on 0-10 pain scale.  Patient stated she can come in tomorrow for appointment, or can send a picture if needed.  Patient is wanting to make sure everything is ok, and if she needs to do anything.  Pleurx  palced 03/11/19, by Dr.Smith LOV 06/17/19, with Dr. Tamala Julian  Instructions  Call when drainage is less than 175cc's (each) on three consecutive drains then we can take out catheter.  Let me know if stomach gets too large and you think fluid building back up.        Message routed to Tammy,NP to advise

## 2019-06-24 NOTE — Telephone Encounter (Signed)
Noted. Thanks Shanetta Nicolls 

## 2019-06-24 NOTE — Telephone Encounter (Signed)
Appreciate it everyone thanks.  Linna Hoff

## 2019-06-24 NOTE — Telephone Encounter (Signed)
Has Seen Warner Mccreedy NP in past . Will send to him for input

## 2019-06-25 ENCOUNTER — Telehealth: Payer: Self-pay | Admitting: Internal Medicine

## 2019-06-25 DIAGNOSIS — J90 Pleural effusion, not elsewhere classified: Secondary | ICD-10-CM

## 2019-06-25 NOTE — Telephone Encounter (Signed)
Spoke with patient. She stated that she needs a refill on her Pleurx supplies. Confirmed that she is still using [facility]. Advised her that I would ask Dr. Carlis Abbott since neither Aaron Edelman nor Dr. Tamala Julian were in office today to sign the order. She verbalized understanding.   Spoke with Dr. Carlis Abbott verbally. She stated that she was ok with signing the orders.   Order has been placed. As discussed during the initial phone call, she is ok with leaving a detailed message. Left her a detailed message.   Nothing further needed at time of call.

## 2019-06-25 NOTE — Telephone Encounter (Signed)
lmtcb for pt. There is a phone note from 06/24/19 regarding her pleurx pain.

## 2019-06-25 NOTE — Telephone Encounter (Signed)
Agreed thank you.   Aaron Edelman

## 2019-06-25 NOTE — Telephone Encounter (Signed)
Ok noted.   Deniss Wormley FNP

## 2019-06-25 NOTE — Telephone Encounter (Signed)
Wound recommend that she start draining MWF now. Will cc Dr. Tamala Julian.   Needs 2-4 week follow up with Dr. Tamala Julian or Appt.   Has she followed up with Oncology about the pain management?  Wyn Quaker FNP

## 2019-06-25 NOTE — Telephone Encounter (Signed)
Called and spoke to pt. Pt states she did drain 360cc from her pleural catheter. Pt states she does have symptom relief after draining. Pt states she thinks she might have noticed blood in the pleural fluid, pt is trying to send picture through [employer]. Pt was originally draining Monday and Friday.   Will forward to Wyn Quaker, NP, to advise. Thanks.

## 2019-06-25 NOTE — Telephone Encounter (Signed)
Called and spoke to pt. Informed her of the recs per Wyn Quaker, NP. She verbalized understanding. Appt made with Dr. Tamala Julian on 4/6. Pt also states she feels the catheter insertion site isnt painful enough today to reach out to oncology. Advised pt that if the pain intensifies to call oncology about pain management. Pt aware to call our office if her output increase substantially or if any s/s worsen.   Will forward to Wyn Quaker, NP, as Juluis Rainier.

## 2019-06-25 NOTE — Telephone Encounter (Signed)
Patient is returning phone call. Patient phone number is [phone].

## 2019-06-25 NOTE — Telephone Encounter (Signed)
Called and spoke to pt. Pt states she feels a slight increase in SOB and feels 'full'. Pt states her pleural fluid has slowly been trending upward over the last few weeks, draining more fluid. Pt states she last drained on Monday 3/15 and collected more than 300cc. Pt states she will drain again now and see if it helps her s/s and will call us back.   Will forward to Wyn Quaker, NP, as Juluis Rainier.

## 2019-06-27 ENCOUNTER — Encounter: Payer: Self-pay | Admitting: Orthopedic Surgery

## 2019-06-27 NOTE — Progress Notes (Signed)
Fracture Visit Note   Patient: Jasmine Allen           Date of Birth: 12/02/1965           MRN: [id number] Visit Date: 06/23/2019 PCP: Nicholas Lose, MD   Assessment & Plan:  Chief Complaint:  Chief Complaint  Patient presents with  . Right Ankle - Pain   Visit Diagnoses:  1. Closed fracture of distal end of right fibula with routine healing, unspecified fracture morphology, subsequent encounter     Plan: Patient is a 54 year old female who presents for follow-up of right ankle fracture with minimally displaced fracture of the distal fibula.  Date of injury was 05/12/2019.  Patient is currently full weightbearing out of the boot into regular shoe.  She notes that she is doing well with occasional swelling but no significant increase in pain.  Pain continues to improve.  She does have some mild tenderness to palpation over the lateral malleolus but she also has good passive dorsiflexion of the right ankle.  She is walking well in the clinic today.  Radiographs taken today show no displacement of the fibular fracture from previous radiographs with callus formation present.  Plan for patient to follow-up with the office as needed.  Follow-Up Instructions: No follow-ups on file.   Orders:  Orders Placed This Encounter  Procedures  . XR Ankle Complete Right   No orders of the defined types were placed in this encounter.   Imaging: No results found.  PMFS History: Patient Active Problem List   Diagnosis Date Noted  . Palliative care by specialist   . Goals of care, counseling/discussion   . Atypical chest pain   . Metastatic breast cancer ([place])   . Cancer related pain   . Abnormal CT scan, lung 01/13/2019  . Pleural effusion 01/13/2019  . Dyspnea 01/13/2019  . Community acquired pneumonia 01/03/2019  . Port-A-Cath in place 10/23/2017  . Malignant neoplasm of lower-inner quadrant of right breast of female, estrogen receptor positive ([place]) 08/23/2017  . Pain of anterior  chest wall with respiration 04/07/2014  . Smoking 04/07/2014  . ANEMIA, IRON DEFICIENCY 10/20/2009  . DEPRESSION 06/15/2009  . GERD 06/15/2009  . IRRITABLE BOWEL SYNDROME 06/15/2009  . RECTAL BLEEDING 06/15/2009   Past Medical History:  Diagnosis Date  . Alopecia, unspecified   . Anemia, unspecified    during her pregnancy  . Anxiety state, unspecified   . Breast cancer ([place]) 2019   Right Breast Cancer  . Bulging disc   . Depressive disorder, not elsewhere classified   . Diverticulosis of colon (without mention of hemorrhage)    CT Scan  . Dysrhythmia    PVCs in the past  . Esophageal reflux   . Esophagitis 1998  . Hiatal hernia 1998  . History of kidney stones   . Hypercholesteremia   . Hypertension   . Migraine   . Opioid abuse, in remission [place])     Family History  Problem Relation Age of Onset  . Heart disease Father        CABG  . Breast cancer Mother   . Breast cancer Maternal Grandmother   . Breast cancer Cousin   . Colon cancer Neg Hx   . Stomach cancer Neg Hx     Past Surgical History:  Procedure Laterality Date  . ABLATION    . BREAST LUMPECTOMY Right 08/16/2017  . BREAST LUMPECTOMY WITH AXILLARY LYMPH NODE BIOPSY Right 08/16/2017   Procedure: RIGHT BREAST LUMPECTOMY  WITH AXILLARY SENTINEL LYMPH NODE BIOPSY;  Surgeon: Rolm Bookbinder, MD;  Location: [place];  Service: General;  Laterality: Right;  . CARDIAC CATHETERIZATION  2011   normal coronaries  . KNEE SURGERY    . PORTACATH PLACEMENT Right 09/11/2017   Procedure: INSERTION PORT-A-CATH WITH ULTRASOUND;  Surgeon: Rolm Bookbinder, MD;  Location: [facility];  Service: General;  Laterality: Right;  . RE-EXCISION OF BREAST LUMPECTOMY Right 09/11/2017   Procedure: RE-EXCISION OF BREAST LUMPECTOMY;  Surgeon: Rolm Bookbinder, MD;  Location: [place];  Service: General;  Laterality: Right;  . TONSILLECTOMY     Social History   Occupational History  . Not on file    Tobacco Use  . Smoking status: Former Smoker    Packs/day: 0.25    Quit date: 08/25/2016    Years since quitting: 2.8  . Smokeless tobacco: Never Used  Substance and Sexual Activity  . Alcohol use: Yes    Comment: "once a week"  . Drug use: No  . Sexual activity: Yes    Birth control/protection: None, Post-menopausal

## 2019-06-29 ENCOUNTER — Other Ambulatory Visit: Payer: Self-pay | Admitting: Hematology and Oncology

## 2019-07-04 ENCOUNTER — Encounter (HOSPITAL_COMMUNITY): Payer: Self-pay

## 2019-07-04 ENCOUNTER — Other Ambulatory Visit: Payer: Self-pay

## 2019-07-04 ENCOUNTER — Ambulatory Visit: Payer: 59 | Admitting: Cardiovascular Disease

## 2019-07-04 ENCOUNTER — Ambulatory Visit (HOSPITAL_COMMUNITY)
Admission: RE | Admit: 2019-07-04 | Discharge: 2019-07-04 | Disposition: A | Discharge status: Home / Self Care | Payer: 59 | Source: Ambulatory Visit | Attending: Hematology and Oncology | Admitting: Hematology and Oncology

## 2019-07-04 DIAGNOSIS — C50311 Malignant neoplasm of lower-inner quadrant of right female breast: Secondary | ICD-10-CM | POA: Diagnosis present

## 2019-07-04 DIAGNOSIS — Z170 Estrogen receptor positive status [ER+]: Secondary | ICD-10-CM | POA: Diagnosis present

## 2019-07-04 DIAGNOSIS — C50919 Malignant neoplasm of unspecified site of unspecified female breast: Secondary | ICD-10-CM | POA: Insufficient documentation

## 2019-07-04 MED ORDER — IOHEXOL 300 MG/ML  SOLN
100.0000 mL | Freq: Once | INTRAMUSCULAR | Status: AC | PRN
  Administered 2019-07-04: 100 mL via INTRAVENOUS

## 2019-07-04 MED ORDER — SODIUM CHLORIDE (PF) 0.9 % IJ SOLN
INTRAMUSCULAR | Status: AC
  Filled 2019-07-04: qty 50

## 2019-07-04 NOTE — Progress Notes (Signed)
RN reviewed CT scans with Sandi Mealy, PA-C -  Lucianne Lei will contact patient to update on results.   RN will review with MD on Monday for recommendations.

## 2019-07-09 ENCOUNTER — Encounter (HOSPITAL_COMMUNITY): Payer: Self-pay

## 2019-07-09 ENCOUNTER — Ambulatory Visit: Payer: 59 | Admitting: Internal Medicine

## 2019-07-09 ENCOUNTER — Telehealth: Payer: Self-pay | Admitting: Internal Medicine

## 2019-07-09 ENCOUNTER — Other Ambulatory Visit: Payer: Self-pay

## 2019-07-09 ENCOUNTER — Emergency Department (HOSPITAL_COMMUNITY)
Admission: EM | Admit: 2019-07-09 | Discharge: 2019-07-09 | Disposition: A | Discharge status: Home / Self Care | Payer: 59 | Attending: Emergency Medicine | Admitting: Emergency Medicine

## 2019-07-09 DIAGNOSIS — Z79899 Other long term (current) drug therapy: Secondary | ICD-10-CM | POA: Diagnosis not present

## 2019-07-09 DIAGNOSIS — R180 Malignant ascites: Secondary | ICD-10-CM | POA: Diagnosis not present

## 2019-07-09 DIAGNOSIS — Z853 Personal history of malignant neoplasm of breast: Secondary | ICD-10-CM | POA: Insufficient documentation

## 2019-07-09 DIAGNOSIS — R140 Abdominal distension (gaseous): Secondary | ICD-10-CM | POA: Insufficient documentation

## 2019-07-09 DIAGNOSIS — I10 Essential (primary) hypertension: Secondary | ICD-10-CM | POA: Insufficient documentation

## 2019-07-09 DIAGNOSIS — Z87891 Personal history of nicotine dependence: Secondary | ICD-10-CM | POA: Insufficient documentation

## 2019-07-09 DIAGNOSIS — E7220 Disorder of urea cycle metabolism, unspecified: Secondary | ICD-10-CM

## 2019-07-09 LAB — COMPREHENSIVE METABOLIC PANEL
ALT: 40 U/L (ref 0–44)
AST: 76 U/L — ABNORMAL HIGH (ref 15–41)
Albumin: 2.6 g/dL — ABNORMAL LOW (ref 3.5–5.0)
Alkaline Phosphatase: 211 U/L — ABNORMAL HIGH (ref 38–126)
Anion gap: 8 (ref 5–15)
BUN: 10 mg/dL (ref 6–20)
CO2: 26 mmol/L (ref 22–32)
Calcium: 7.7 mg/dL — ABNORMAL LOW (ref 8.9–10.3)
Chloride: 102 mmol/L (ref 98–111)
Creatinine, Ser: 0.48 mg/dL (ref 0.44–1.00)
GFR calc Af Amer: >60 mL/min (ref >60)
GFR calc non Af Amer: >60 mL/min (ref >60)
Glucose, Bld: 115 mg/dL — ABNORMAL HIGH (ref 70–99)
Potassium: 3.4 mmol/L — ABNORMAL LOW (ref 3.5–5.1)
Sodium: 136 mmol/L (ref 135–145)
Total Bilirubin: 0.9 mg/dL (ref 0.3–1.2)
Total Protein: 5.7 g/dL — ABNORMAL LOW (ref 6.5–8.1)

## 2019-07-09 LAB — CBC WITH DIFFERENTIAL/PLATELET
Abs Immature Granulocytes: 0.01 10*3/uL (ref 0.00–0.07)
Basophils Absolute: 0 10*3/uL (ref 0.0–0.1)
Basophils Relative: 1 %
Eosinophils Absolute: 0 10*3/uL (ref 0.0–0.5)
Eosinophils Relative: 1 %
HCT: 37.6 % (ref 36.0–46.0)
Hemoglobin: 12.5 g/dL (ref 12.0–15.0)
Immature Granulocytes: 0 %
Lymphocytes Relative: 30 %
Lymphs Abs: 0.9 10*3/uL (ref 0.7–4.0)
MCH: 36.2 pg — ABNORMAL HIGH (ref 26.0–34.0)
MCHC: 33.2 g/dL (ref 30.0–36.0)
MCV: 109 fL — ABNORMAL HIGH (ref 80.0–100.0)
Monocytes Absolute: 0.3 10*3/uL (ref 0.1–1.0)
Monocytes Relative: 9 %
Neutro Abs: 1.8 10*3/uL (ref 1.7–7.7)
Neutrophils Relative %: 59 %
Platelets: 45 10*3/uL — ABNORMAL LOW (ref 150–400)
RBC: 3.45 MIL/uL — ABNORMAL LOW (ref 3.87–5.11)
RDW: 17.2 % — ABNORMAL HIGH (ref 11.5–15.5)
WBC: 3.1 10*3/uL — ABNORMAL LOW (ref 4.0–10.5)
nRBC: 0 % (ref 0.0–0.2)

## 2019-07-09 LAB — AMMONIA: Ammonia: 104 umol/L — ABNORMAL HIGH (ref 9–35)

## 2019-07-09 LAB — LIPASE, BLOOD: Lipase: 25 U/L (ref 11–51)

## 2019-07-09 MED ORDER — LORAZEPAM 2 MG/ML IJ SOLN
0.5000 mg | Freq: Once | INTRAMUSCULAR | Status: AC
  Administered 2019-07-09: 0.5 mg via INTRAVENOUS
  Filled 2019-07-09: qty 1

## 2019-07-09 MED ORDER — LIDOCAINE-EPINEPHRINE 2 %-1:100000 IJ SOLN
20.0000 mL | Freq: Once | INTRAMUSCULAR | Status: AC
  Administered 2019-07-09: 16:00:00 20 mL via INTRADERMAL
  Filled 2019-07-09: qty 1

## 2019-07-09 NOTE — Telephone Encounter (Signed)
Called and spoke to pt. Informed her of recs per Dr. Shearon Stalls and advised pt to go to the ED for eval. Pt verbalized understanding and states she will speak with her husband about going to the ED and call back to let us know if she will go today or tomorrow. Pt knows she should seek emergency care today as her abdomen is firm. Will keep message open in case pt calls back.

## 2019-07-09 NOTE — Telephone Encounter (Signed)
Spoke with the pt and advised that I will let Dr Carlis Abbott know that she is going to [facility].  Will route msg to her marked urgent so she is aware.

## 2019-07-09 NOTE — Discharge Instructions (Addendum)
Contact a health care provider if: You have redness, swelling, or pain at your puncture site. You have more fluid or blood coming from your puncture site. Your puncture site feels warm to the touch. You have pus or a bad smell coming from your puncture site. You have a fever. Get help right away if: You have chest pain or shortness of breath. You develop increasing pain, discomfort, or swelling in your abdomen. You feel dizzy or light-headed or you faint.

## 2019-07-09 NOTE — ED Notes (Signed)
PA at bedside for paracentesis.

## 2019-07-09 NOTE — Telephone Encounter (Signed)
Pt on her way to [facility] to drain fluid.  Wanted to make sure Dr. Carlis Abbott would be there and if someone would make them aware she is on her way.  [phone]

## 2019-07-09 NOTE — ED Provider Notes (Signed)
[facility] DEPT Provider Note   CSN: [id number] Arrival date & time: 07/09/19  1404     History Chief Complaint  Patient presents with  . Abd Distension    Jasmine Allen is a 54 y.o. female.  With a past medical history of metastatic breast cancer.  Patient has recurrent pleural effusions and has a Pleurx catheter in the right chest wall.  Patient's belly has been becoming more and more distended over the past 2 weeks.  Is now tight, firm, painful and causing her difficulty breathing and a lot of discomfort.  She called her pulmonologist Dr. Shearon Stalls who referred her to the emergency department for paracentesis.  Patient has had to have 1 of these in the past secondary to malignant pleural effusions.  She denies fevers, chills, nausea, vomiting.  She denies abdominal pain other than pain from distention.  HPI     Past Medical History:  Diagnosis Date  . Alopecia, unspecified   . Anemia, unspecified    during her pregnancy  . Anxiety state, unspecified   . Breast cancer ([place]) 2019   Right Breast Cancer  . Bulging disc   . Depressive disorder, not elsewhere classified   . Diverticulosis of colon (without mention of hemorrhage)    CT Scan  . Dysrhythmia    PVCs in the past  . Esophageal reflux   . Esophagitis 1998  . Hiatal hernia 1998  . History of kidney stones   . Hypercholesteremia   . Hypertension   . Migraine   . Opioid abuse, in remission [facility])     Patient Active Problem List   Diagnosis Date Noted  . Palliative care by specialist   . Goals of care, counseling/discussion   . Atypical chest pain   . Metastatic breast cancer ([place])   . Cancer related pain   . Abnormal CT scan, lung 01/13/2019  . Pleural effusion 01/13/2019  . Dyspnea 01/13/2019  . Community acquired pneumonia 01/03/2019  . Port-A-Cath in place 10/23/2017  . Malignant neoplasm of lower-inner quadrant of right breast of female, estrogen receptor positive ([place])  08/23/2017  . Pain of anterior chest wall with respiration 04/07/2014  . Smoking 04/07/2014  . ANEMIA, IRON DEFICIENCY 10/20/2009  . DEPRESSION 06/15/2009  . GERD 06/15/2009  . IRRITABLE BOWEL SYNDROME 06/15/2009  . RECTAL BLEEDING 06/15/2009    Past Surgical History:  Procedure Laterality Date  . ABLATION    . BREAST LUMPECTOMY Right 08/16/2017  . BREAST LUMPECTOMY WITH AXILLARY LYMPH NODE BIOPSY Right 08/16/2017   Procedure: RIGHT BREAST LUMPECTOMY WITH AXILLARY SENTINEL LYMPH NODE BIOPSY;  Surgeon: Rolm Bookbinder, MD;  Location: [place];  Service: General;  Laterality: Right;  . CARDIAC CATHETERIZATION  2011   normal coronaries  . KNEE SURGERY    . PORTACATH PLACEMENT Right 09/11/2017   Procedure: INSERTION PORT-A-CATH WITH ULTRASOUND;  Surgeon: Rolm Bookbinder, MD;  Location: [place];  Service: General;  Laterality: Right;  . RE-EXCISION OF BREAST LUMPECTOMY Right 09/11/2017   Procedure: RE-EXCISION OF BREAST LUMPECTOMY;  Surgeon: Rolm Bookbinder, MD;  Location: [place];  Service: General;  Laterality: Right;  . TONSILLECTOMY       OB History   No obstetric history on file.     Family History  Problem Relation Age of Onset  . Heart disease Father        CABG  . Breast cancer Mother   . Breast cancer Maternal Grandmother   . Breast cancer  Cousin   . Colon cancer Neg Hx   . Stomach cancer Neg Hx     Social History   Tobacco Use  . Smoking status: Former Smoker    Packs/day: 0.25    Quit date: 08/25/2016    Years since quitting: 2.8  . Smokeless tobacco: Never Used  Substance Use Topics  . Alcohol use: Yes    Comment: "once a week"  . Drug use: No    Home Medications Prior to Admission medications   Medication Sig Start Date End Date Taking? Authorizing Provider  ALPRAZolam (XANAX) 0.25 MG tablet Take 1 tablet (0.25 mg total) by mouth at bedtime as needed for anxiety. Takes one dose at bedtime for insomnia 05/12/19    Nicholas Lose, MD  Biotin 10000 MCG TABS Take 10,000 mcg by mouth daily.     [provider]  bumetanide (BUMEX) 1 MG tablet TAKE 1 TABLET(1 MG) BY MOUTH TWICE DAILY 04/18/19   Nicholas Lose, MD  chlorpheniramine-HYDROcodone (TUSSIONEX PENNKINETIC ER) 10-8 MG/5ML SUER Take 5 mLs by mouth 2 (two) times daily. 03/07/19   Nicholas Lose, MD  cyclobenzaprine (FLEXERIL) 10 MG tablet Take 10 mg by mouth every 8 (eight) hours as needed for muscle spasms. 07/16/17   [provider]  dexamethasone (DECADRON) 1 MG tablet TAKE 1 TABLET(1 MG) BY MOUTH DAILY WITH BREAKFAST 06/13/19   Nicholas Lose, MD  diphenoxylate-atropine (LOMOTIL) 2.5-0.025 MG tablet Take 1 tablet by mouth 4 (four) times daily as needed for diarrhea or loose stools. 03/07/19   Nicholas Lose, MD  hydrOXYzine (ATARAX/VISTARIL) 10 MG tablet Take 10-30 mg by mouth at bedtime as needed for itching.  10/07/15   [provider]  letrozole (FEMARA) 2.5 MG tablet Take 1 tablet (2.5 mg total) by mouth daily. 01/15/19   Nicholas Lose, MD  LORazepam (ATIVAN) 0.5 MG tablet TAKE 1 TABLET(0.5 MG) BY MOUTH EVERY 8 HOURS 06/30/19   Nicholas Lose, MD  morphine (MS CONTIN) 15 MG 12 hr tablet Take 1 tablet (15 mg total) by mouth every 12 (twelve) hours. 06/06/19   Nicholas Lose, MD  morphine (MSIR) 15 MG tablet Take 1 tablet (15 mg total) by mouth every 6 (six) hours as needed for severe pain. 05/26/19   Gardenia Phlegm, NP  Multiple Vitamin (MULITIVITAMIN WITH MINERALS) TABS Take 1 tablet by mouth daily.    [provider]  ondansetron (ZOFRAN ODT) 4 MG disintegrating tablet Take 1 tablet (4 mg total) by mouth every 8 (eight) hours as needed for nausea or vomiting. 05/08/19   Nicholas Lose, MD  palbociclib (IBRANCE) 75 MG tablet Take 75 mg by mouth daily. Take for 21 days on, 7 days off, repeat every 28 days.    [provider]  pantoprazole (PROTONIX) 40 MG tablet Take 1 tablet (40 mg total) by mouth 2 (two) times daily  before a meal. Patient taking differently: Take 40 mg by mouth 2 (two) times daily.  06/28/16   Mauri Pole, MD  polyethylene glycol (MIRALAX / GLYCOLAX) 17 g packet Take 17 g by mouth 2 (two) times daily. Decrease to daily if having watery or multiple loose stools daily. 02/06/19   Mariel Aloe, MD  potassium chloride SA (KLOR-CON) 20 MEQ tablet Take 2 tablets (40 mEq total) by mouth daily. TAKE 1 TABLET(20 MEQ) BY MOUTH DAILY Patient taking differently: Take 40 mEq by mouth daily. Currently taking bid 04/21/19   Nicholas Lose, MD  prochlorperazine (COMPAZINE) 10 MG tablet Take 1 tablet (10  mg total) by mouth every 6 (six) hours as needed for nausea or vomiting. 05/08/19   Nicholas Lose, MD  senna (SENOKOT) 8.6 MG tablet Take by mouth.    [provider]  sertraline (ZOLOFT) 25 MG tablet Take 1 tablet (25 mg total) by mouth daily. 05/12/19   Nicholas Lose, MD  traZODone (DESYREL) 100 MG tablet Take 150 mg by mouth at bedtime.  10/17/12   [provider]  valACYclovir (VALTREX) 1000 MG tablet Take by mouth. Prn fever blisters    [provider]    Allergies    Amoxicillin-pot clavulanate, Erythromycin, Metoclopramide hcl, and Sulfonamide derivatives  Review of Systems   Review of Systems Ten systems reviewed and are negative for acute change, except as noted in the HPI.   Physical Exam Updated Vital Signs BP 138/84 (BP Location: Left Arm)   Pulse 93   Temp 98.7 F (37.1 C) (Oral)   Resp 16   LMP 12/24/2010   SpO2 98%   Physical Exam Vitals and nursing note reviewed.  Constitutional:      General: She is not in acute distress.    Appearance: She is well-developed. She is not diaphoretic.  HENT:     Head: Normocephalic and atraumatic.  Eyes:     General: No scleral icterus.    Conjunctiva/sclera: Conjunctivae normal.  Cardiovascular:     Rate and Rhythm: Normal rate and regular rhythm.     Heart sounds: Normal heart sounds. No murmur. No friction  rub. No gallop.   Pulmonary:     Effort: Pulmonary effort is normal. No respiratory distress.     Breath sounds: Normal breath sounds.  Abdominal:     General: Bowel sounds are normal. There is distension.     Palpations: Abdomen is soft. There is no mass.     Tenderness: There is no abdominal tenderness. There is no guarding.     Comments: Tight fluid filled abdomen  Musculoskeletal:     Cervical back: Normal range of motion.  Skin:    General: Skin is warm and dry.  Neurological:     Mental Status: She is alert and oriented to person, place, and time.  Psychiatric:        Behavior: Behavior normal.     ED Results / Procedures / Treatments   Labs (all labs ordered are listed, but only abnormal results are displayed) Labs Reviewed  COMPREHENSIVE METABOLIC PANEL  CBC WITH DIFFERENTIAL/PLATELET  LIPASE, BLOOD  URINALYSIS, ROUTINE W REFLEX MICROSCOPIC    EKG None  Radiology No results found.  Procedures .Paracentesis  Date/Time: 07/09/2019 5:57 PM Performed by: Margarita Mail, PA-C Authorized by: Margarita Mail, PA-C   Consent:    Consent obtained:  Verbal   Risks discussed:  Bleeding, bowel perforation, infection and pain (hypotension)   Alternatives discussed:  No treatment and delayed treatment Pre-procedure details:    Procedure purpose:  Therapeutic   Preparation: Patient was prepped and draped in usual sterile fashion   Anesthesia (see MAR for exact dosages):    Anesthesia method:  Local infiltration   Local anesthetic:  Lidocaine 1% WITH epi Procedure details:    Needle gauge:  20   Ultrasound guidance: yes     Puncture site:  L lower quadrant   Fluid removed amount:  2300   Fluid appearance:  Yellow and clear   Dressing: dermabond. Post-procedure details:    Patient tolerance of procedure:  Tolerated with difficulty   (including critical care time)  Medications  Ordered in ED Medications - No data to display  ED Course  I have reviewed the  triage vital signs and the nursing notes.  Pertinent labs & imaging results that were available during my care of the patient were reviewed by me and considered in my medical decision making (see chart for details).    MDM Rules/Calculators/A&P                       This is a 54 year old female with metastatic breast cancer, recurrent malignant pleural effusion with right thoracic Pleurx catheter in place and occasional malignant ascites requiring paracentesis.  I attempted the procedure twice with visualized catheter in place however during the first attempt either the catheter became clotted or was looped up against bowel however readjustment did not allow for drainage of the fluid.  I did use needle aspiration with a 60 cc syringe and was able to remove about 300 mL of fluid prior to reinserting a catheter at which time I removed another 2 L of fluid.  Patient had significant improvement in girth of her abdomen, discomfort and palpable softness that was not present prior to the procedure.  She was able to breathe better.  I have reviewed the patient's labs which show mild leukopenia, macrocytosis without anemia.  She is thrombocytopenic at 45,000 on her platelets however patient had no active bleeding at the site of her catheter placement and leakage of peritoneal fluid was resolved easily using Dermabond.  The patient's CMP does show mildly elevated blood glucose, mildly low potassium both of insignificant value.  Protein levels are low.  Patient ammonia level is elevated however she does not appear to have any confusion and is following closely with her pulmonology and critical care doctor in the next 2 days.  I have discussed return precautions with the patient.   Final Clinical Impression(s) / ED Diagnoses Final diagnoses:  None    Rx / DC Orders ED Discharge Orders    None       Margarita Mail, PA-C 07/10/19 [place], [place], DO 07/10/19 1639

## 2019-07-09 NOTE — ED Triage Notes (Signed)
Pt arrived POV to ED CC abd swelling/distension X2 weeks. Pt denies urinary or bowel changes. Pt reports " discomfort and just cant take it anymore I need it fixed it is putting pressure on everything else". Pt is not receiving active chemo at this time.     Hx breast cancer, right "plurex"

## 2019-07-09 NOTE — Telephone Encounter (Signed)
Actually I took a closer look at her chart - if she has a tense abdomen that is hard to the touch - fluid could be building up in her abdomen and she may need to be seen in the ED for evaluation. She may need a paracentesis to drain fluid from her abdomen.

## 2019-07-09 NOTE — Telephone Encounter (Signed)
Spoke with the pt's spouse.  He reports pt is having increased swelling in her abdomen- started 2 wks ago and progressively getting worse. He states that her stomach feels hard to the touch.  She is feeling more SOB also.  Wants to come in today and be seen. Dr Shearon Stalls- you had an 15 min opening at 2 pm that I blocked in case we needed to use for acute visit today with limited providers in the office. Can we use this slot to work her in? Please advise thanks!

## 2019-07-09 NOTE — Telephone Encounter (Signed)
That is fine 

## 2019-07-10 MED ORDER — LACTULOSE 20 G PO PACK: 20.0000 g | PACK | Freq: Three times a day (TID) | ORAL | 1 refills | Status: DC

## 2019-07-11 ENCOUNTER — Other Ambulatory Visit: Payer: Self-pay | Admitting: Oncology

## 2019-07-11 ENCOUNTER — Telehealth: Payer: Self-pay | Admitting: Internal Medicine

## 2019-07-11 ENCOUNTER — Telehealth: Payer: Self-pay | Admitting: *Deleted

## 2019-07-11 MED ORDER — MORPHINE SULFATE ER 15 MG PO TBCR: 15.0000 mg | EXTENDED_RELEASE_TABLET | Freq: Two times a day (BID) | ORAL | 0 refills | Status: DC

## 2019-07-11 NOTE — Telephone Encounter (Signed)
Called patient, still feels miserable.  Discussed with her and husband to try lactulose.  Will move her appt to Monday.

## 2019-07-11 NOTE — Telephone Encounter (Signed)
-----   Message from Candee Furbish, MD sent at 07/10/2019 10:54 AM EDT ----- Regarding: FW: Lactulose  ----- Message ----- From: Candee Furbish, MD Sent: 07/10/2019   8:35 AM EDT To: Candee Furbish, MD Subject: Lactulose                                      Call her and make sure taking

## 2019-07-11 NOTE — Telephone Encounter (Signed)
Received call from pt stating she is experiencing body shaking x2 weeks which occurs 85-90% of the time.  Per Sandi Mealy, PA pt to stop taking Zoloft over the weekend to see if symptoms improve.  Pt verbalized understanding and appreciative of the advice.

## 2019-07-14 ENCOUNTER — Other Ambulatory Visit: Payer: Self-pay | Admitting: Internal Medicine

## 2019-07-14 ENCOUNTER — Encounter: Payer: Self-pay | Admitting: Internal Medicine

## 2019-07-14 ENCOUNTER — Ambulatory Visit (HOSPITAL_COMMUNITY)
Admission: RE | Admit: 2019-07-14 | Discharge: 2019-07-14 | Disposition: A | Discharge status: Home / Self Care | Payer: 59 | Source: Ambulatory Visit | Attending: Internal Medicine | Admitting: Internal Medicine

## 2019-07-14 ENCOUNTER — Other Ambulatory Visit: Payer: Self-pay

## 2019-07-14 ENCOUNTER — Ambulatory Visit (INDEPENDENT_AMBULATORY_CARE_PROVIDER_SITE_OTHER): Payer: 59 | Admitting: Internal Medicine

## 2019-07-14 VITALS — BP 122/62 | HR 101 | Temp 98.1°F | Ht 60.0 in | Wt 134.2 lb

## 2019-07-14 DIAGNOSIS — R160 Hepatomegaly, not elsewhere classified: Secondary | ICD-10-CM

## 2019-07-14 NOTE — Patient Instructions (Addendum)
-   Increase bumex - See [place] Thursday - Stat US abdomen - IR referral for fluid drainage and send it for cytology - Keep up activity and nutrition as able - Continue lactulose titrated to 2-3 loose bowel movements daily - Continue pleurx drainage 3x/week - Return in 1 month

## 2019-07-14 NOTE — Progress Notes (Signed)
Pulmonary Office Followup Note 07/14/19  Seen in f/u for malignant effusion  S: 54 year old woman with metastatic breast cancer with mets to bone, lungs, and R pleural space with recurrent malignant effusion.   S/P pleurX 03/11/19 Draining 200cc every MWF to good effect Abdomen now more distended Had to go to ER for paracentesis last week, no studies sent New CT showing new hepatic lesion Labs showing elevated ammonia Started her on lactulose on Friday which has helped a bit  O: Today's Vitals   07/14/19 1152  BP: 122/62  Pulse: (!) 101  Temp: 98.1 F (36.7 C)  TempSrc: Temporal  SpO2: 99%  Weight: 134 lb 3.2 oz (60.9 kg)  Height: 5' (1.524 m)   Body mass index is 26.21 kg/m. On 3L  GEN: middle aged woman in NAD HEENT: no thrush, RRR CV: RRR, ext warm PULM: clear, no wheezing GI: Distended, +small ascites on [place] EXT: Trace edema NEURO: Moves all 4 ext to command PSYCH: AOx3, excellent insight  SKIN: deferred site check today  CT Abd/Pelvis IMPRESSION: 1. Similar to slightly diminished small volume pleural fluid on the right. 2. Slightly diminished pleural fluid in the right chest with stable findings of lymphangitic carcinomatosis and right middle lobe nodule. 3. Improved aeration at the left lung base. 4. New lesion, presumed metastatic, in the right hepatic lobe with worsening hepatic nodularity. 5. Liver displays cirrhotic morphology with evidence of portosystemic collaterals and increasing ascites. Ascites may be be due to medical liver disease or peritoneal disease. Diffuse bony metastatic disease as before.  A: # Recurrent malignant R effusion s/p PleurX 12/1 # Metastatic breast cancer on letrozole,Ibrance, and faslodex # Pain due to bone and nerve infiltration of tumor # Worsening ascites and hepatic encephalopathy related to presumed tumor infiltration of liver vs. ?cirrhosis as side effect of chemo.  Rapidity of this is worrisome.  This has affected  her breathing due to reduced FRC.   P: - Drain R pleurx MWF until starts feeling entrapment/ pain, monitor output - Drainage of 2.5L ascitic fluid last week did not improve symptoms much; I do not think she has more than a liter or two presently so do not think in-office drainage would help much symptomatically.  I would like her to go to IR and get another tap for cytology at some point but non-urgent - Send for portal vein evaluation to make sure no thrombosis; if it is all tumor, will need to discuss whether targeted radiation is even an option; could consider TIPS but already has hepatic encephalopathy and this can make it worse. - Will send a message to Dr. Lindi Adie regarding latest developments.  A total of 65 minutes spent caring for patient more than half of which was spent face to face.  Erskine Emery MD

## 2019-07-15 ENCOUNTER — Ambulatory Visit: Payer: 59 | Admitting: Internal Medicine

## 2019-07-15 NOTE — Addendum Note (Signed)
Addended by: Ina Homes on: 07/15/2019 11:47 AM   Modules accepted: Orders

## 2019-07-15 NOTE — Progress Notes (Signed)
Patient Care Team: Nicholas Lose, MD as PCP - General (Hematology and Oncology) Nicholas Lose, MD as Consulting Physician (Hematology and Oncology) Kyung Rudd, MD as Consulting Physician (Radiation Oncology) Rolm Bookbinder, MD as Consulting Physician (General Surgery) Julianne Handler, NP as Nurse Practitioner (Hospice and Palliative Medicine)  DIAGNOSIS:    ICD-10-CM   1. Metastatic breast cancer ([facility])  C50.919   2. Malignant neoplasm of lower-inner quadrant of right breast of female, estrogen receptor positive (HCC)  C50.311 morphine (MSIR) 15 MG tablet   Z17.0   3. Fatty (change of) liver, not elsewhere classified  K76.0 MR LIVER W WO CONTRAST    SUMMARY OF ONCOLOGIC HISTORY: Oncology History  Malignant neoplasm of lower-inner quadrant of right breast of female, estrogen receptor positive ([facility])  08/16/2017 Initial Diagnosis   Right lumpectomy: 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  High risk   08/16/2017 Cancer Staging   Staging form: Breast, AJCC 8th Edition - Pathologic stage from 08/16/2017: Stage IA (pT1c, pN0, cM0, G2, ER+, PR+, HER2-, Oncotype DX score: 31) - Signed by Gardenia Phlegm, NP on 09/12/2018   09/25/2017 - 02/05/2018 Chemotherapy   Adjuvant chemotherapy with dose dense Adriamycin and Cytoxan x4 followed by Taxol weekly x12    03/11/2018 - 04/25/2018 Radiation Therapy   Adjuvant XRT   01/09/2019 PET scan   Pulmonary hypermetabolic and consistent with lymphangitic tumor spread, confluent right middle lobe consolidation, hypermetabolic abdominal, cervical and left axillary lymph nodes, diffuse liver hypermetabolic some nonspecific.  Diffuse bone marrow hypermetabolism worrisome for metastatic disease.  Prior pericardial  effusion resolved.   01/16/2019 Relapse/Recurrence   Pleural effusion: Thoracentesis revealed metastatic adenocarcinoma breast primary ER 75%, PR 50%, HER-2 -1+ Bone marrow biopsy positive for metastatic breast cancer     CHIEF COMPLIANT: Follow-up of metastatic breast cancer on Ibrance, review scans   INTERVAL HISTORY: Jasmine Allen is a 54 y.o. with above-mentioned history of metastatic breast cancer with pleural effusions currently on Ibrance and letrozole with Faslodex and Xgeva. CT CAP on 07/04/19 showed slightly diminished right pleural effusion, a new hepatic lesion with worsening hepatic nodularity, and diffuse osseous metastatic disease. Abdominal US on 07/14/19 showed cirrhosis and was unable to evaluate the hepatic lesion seen on the CT. She presents to the clinic today to review her scans. She has profound abdominal distention.  Couple of months ago she had a paracentesis and thoracentesis.  Both of the fluid cytologies were positive for malignant cells.  A week ago she went to the emergency room for the paracentesis and they took her 2 L but she is once again filled out back again.  She has seen Dr. Tamala Julian who set her up for a IR paracentesis that is going to be done tomorrow.  She has profound hypoalbuminemia.  Staging CT scans suggestive of cirrhosis of the liver and a new liver metastases.  ALLERGIES:  is allergic to amoxicillin-pot clavulanate; erythromycin; metoclopramide hcl; and sulfonamide derivatives.  MEDICATIONS:  Current Outpatient Medications  Medication Sig Dispense Refill  . ALPRAZolam (XANAX) 0.25 MG tablet Take 1 tablet (0.25 mg total) by mouth at bedtime as needed for anxiety. Takes one dose at bedtime for insomnia 30 tablet 3  . Biotin 10000 MCG TABS Take  10,000 mcg by mouth daily.     . bumetanide (BUMEX) 1 MG tablet TAKE 1 TABLET(1 MG) BY MOUTH TWICE DAILY 60 tablet 2  . cyclobenzaprine (FLEXERIL) 10 MG tablet Take 10 mg by mouth every 8 (eight) hours as needed  for muscle spasms.  1  . hydrOXYzine (ATARAX/VISTARIL) 10 MG tablet Take 10-30 mg by mouth at bedtime as needed for itching.   0  . lactulose (CEPHULAC) 20 g packet Take 1 packet (20 g total) by mouth 3 (three) times daily. 90 each 1  . letrozole (FEMARA) 2.5 MG tablet Take 1 tablet (2.5 mg total) by mouth daily. 90 tablet 3  . LORazepam (ATIVAN) 0.5 MG tablet TAKE 1 TABLET(0.5 MG) BY MOUTH EVERY 8 HOURS 30 tablet 0  . morphine (MS CONTIN) 15 MG 12 hr tablet Take 1 tablet (15 mg total) by mouth every 12 (twelve) hours. 60 tablet 0  . morphine (MSIR) 15 MG tablet Take 1 tablet (15 mg total) by mouth every 6 (six) hours as needed for severe pain. 60 tablet 0  . Multiple Vitamin (MULITIVITAMIN WITH MINERALS) TABS Take 1 tablet by mouth daily.    . palbociclib (IBRANCE) 75 MG tablet Take 75 mg by mouth daily. Take for 21 days on, 7 days off, repeat every 28 days.    . pantoprazole (PROTONIX) 40 MG tablet Take 1 tablet (40 mg total) by mouth 2 (two) times daily before a meal. (Patient taking differently: Take 40 mg by mouth 2 (two) times daily. ) 60 tablet 6  . potassium chloride SA (KLOR-CON) 20 MEQ tablet Take 2 tablets (40 mEq total) by mouth daily. TAKE 1 TABLET(20 MEQ) BY MOUTH DAILY (Patient taking differently: Take 40 mEq by mouth daily. Currently taking bid) 60 tablet 6  . prochlorperazine (COMPAZINE) 10 MG tablet Take 1 tablet (10 mg total) by mouth every 6 (six) hours as needed for nausea or vomiting. 30 tablet 0  . sertraline (ZOLOFT) 50 MG tablet Take 1 tablet (50 mg total) by mouth daily. 90 tablet 3  . traZODone (DESYREL) 100 MG tablet Take 150 mg by mouth at bedtime.     . valACYclovir (VALTREX) 1000 MG tablet Take by mouth. Prn fever blisters     No current facility-administered medications for this visit.    PHYSICAL EXAMINATION: ECOG PERFORMANCE STATUS: 1 - Symptomatic but completely ambulatory  Vitals:   07/16/19 1437  BP: 121/72  Pulse: 91  Resp: 18  Temp: 98.9 F (37.2 C)    SpO2: 98%   Filed Weights   07/16/19 1437  Weight: 135 lb 1.6 oz (61.3 kg)    LABORATORY DATA:  I have reviewed the data as listed CMP Latest Ref Rng & Units 07/16/2019 07/09/2019 06/19/2019  Glucose 70 - 99 mg/dL 195(H) 115(H) 190(H)  BUN 6 - 20 mg/dL _0 Creatinine 0.44 - 1.00 mg/dL 0.62 0.48 0.62  Sodium 135 - 145 mmol/L 133(L) 136 139  Potassium 3.5 - 5.1 mmol/L 4.2 3.4(L) 3.6  Chloride 98 - 111 mmol/L 102 102 103  CO2 22 - 32 mmol/L _1 Calcium 8.9 - 10.3 mg/dL 7.8(L) 7.7(L) 7.8(L)  Total Protein 6.5 - 8.1 g/dL 5.7(L) 5.7(L) 5.8(L)  Total Bilirubin 0.3 - 1.2 mg/dL 1.2 0.9 0.9  Alkaline Phos 38 - 126 U/L 312(H) 211(H) 252(H)  AST 15 - 41 U/L 89(H) 76(H) 68(H)  ALT 0 - 44 U/L 43 40 46(H)    Lab Results  Component Value Date  WBC 4.0 07/16/2019   HGB 11.6 (L) 07/16/2019   HCT 34.8 (L) 07/16/2019   MCV 109.4 (H) 07/16/2019   PLT 73 (L) 07/16/2019   NEUTROABS 2.1 07/16/2019    ASSESSMENT & PLAN:  Malignant neoplasm of lower-inner quadrant of right breast of female, estrogen receptor positive (HCC) 08/16/2017:Right lumpectomy: Grade 2 IDC 1.5 cm, with DCIS and necrosis, 0/3 lymph nodes negative, ER 95%, PR 30%, HER-2 negative ratio 1.14, Ki-67 40%, T1CN0 stage Ia; resection of the margin 09/11/2017: Benign  01/09/2019: PET CT scan:Pulmonary hypermetabolic and consistent with lymphangitic tumor spread, confluent right middle lobe consolidation, hypermetabolic abdominal, cervical and left axillary lymph nodes, diffuse liver hypermetabolic some nonspecific. Diffuse bone marrow hypermetabolism worrisome for metastatic disease. Prior pericardial effusion resolved.  01/16/2019:Pleural effusion: Thoracentesis revealed metastatic adenocarcinoma breast primary ER 75%, PR 50%, HER-2 -1+ Bone marrow biopsy positive for metastatic breast cancer  [facility] molecular testing: ER/PR positive HER-2 negative, ER positive, TMB low (1), BRCA 1 and 2 -, ESR 1 mutation not detected, PI K3  CA negative Guardant 360: T p53, no evidence of MSI high -------------------------------------------------------------------------------------------------------------------------------------------------------------- Current treatment: Letrozole 2.5 mg daily started 01/15/2019, Ibrance started 01/24/2019, Faslodex and Xgeva added 02/21/2019 Hospitalization: 02/03/19- 02/07/19 Malignant Pleural effusion S/P thoracentesisstatus post Pleurx catheter placement 03/11/2019  Plan: 1.Faslodex added to the treatment11/13/2020 2. Ibrance 75 mg, currently on 3 weeks on 2 weeks off regimen 3.Xgeva q 3 months  CT CAP 07/04/2019: Diminished pleural fluid, stable lymphangitic carcinomatosis, new lesion right hepatic lobe with worsening hepatic nodularity, liver displays cirrhotic morphology, bony metastases same as before.  We spent a lot of time discussing the results of the CT scans.  I also called and discussed the case with Dr. Laurence Ferrari. 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  We will obtain a liver MRI for further evaluation. Unfortunately because of malignant cells in the pleural and peritoneal fluids, liver directed therapies may not be very effective and can cause more liver damage according to Dr. Laurence Ferrari.  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  Therefore TIPS procedure is not indicated.  If she has recurrent ascites then we can consider a peritoneal drainage catheter.  After tomorrow's paracentesis we will administer IV albumin.  Liver met: Because this is a new lesion it would indicate progression of disease.  I discussed with her about different treatment options and we all felt that currently we will continue with the same treatment because the bone marrow appears to be recovering and the bone metastases are stable.  We can keep an eye on the liver metastases.  I will call her with the results of the liver MRI. We will continue monthly injections and I  will see her back in 1 month with labs.    Orders Placed This Encounter  Procedures  . MR LIVER W WO CONTRAST    Standing Status:   Future    Standing Expiration Date:   09/14/2020    Order Specific Question:   ** REASON FOR EXAM (FREE TEXT)    Answer:   Evaluate the liver changes, HCC versus metastatic breast cancer versus pseudocirrhosis    Order Specific Question:   If indicated for the ordered procedure, I authorize the administration of contrast media per Radiology protocol    Answer:   Yes    Order Specific Question:   What is the patient's sedation requirement?    Answer:   No Sedation    Order Specific Question:   Does the patient have  a pacemaker or implanted devices?    Answer:   No    Order Specific Question:   Release to patient    Answer:   Immediate    Order Specific Question:   Radiology Contrast Protocol - do NOT remove file path    Answer:   [email or web address]    Order Specific Question:   Preferred imaging location?    Answer:   [facility] (table limit-350 lbs)   The patient has a good understanding of the overall plan. she agrees with it. she will call with any problems that may develop before the next visit here.  Total time spent: 30 mins including face to face time and time spent for planning, charting and coordination of care  Nicholas Lose, MD 07/16/2019  I, Cloyde Reams Dorshimer, am acting as scribe for Dr. Nicholas Lose.  I have reviewed the above documentation for accuracy and completeness, and I agree with the above.

## 2019-07-15 NOTE — Addendum Note (Signed)
Addended by: Elton Sin on: 07/15/2019 09:10 AM   Modules accepted: Orders

## 2019-07-16 ENCOUNTER — Ambulatory Visit: Payer: 59

## 2019-07-16 ENCOUNTER — Telehealth: Payer: Self-pay | Admitting: Internal Medicine

## 2019-07-16 ENCOUNTER — Other Ambulatory Visit: Payer: Self-pay

## 2019-07-16 ENCOUNTER — Ambulatory Visit: Payer: 59 | Admitting: Hematology and Oncology

## 2019-07-16 ENCOUNTER — Inpatient Hospital Stay: Payer: 59 | Attending: Adult Health

## 2019-07-16 ENCOUNTER — Inpatient Hospital Stay (HOSPITAL_BASED_OUTPATIENT_CLINIC_OR_DEPARTMENT_OTHER): Payer: 59 | Admitting: Hematology and Oncology

## 2019-07-16 ENCOUNTER — Inpatient Hospital Stay: Payer: 59

## 2019-07-16 ENCOUNTER — Other Ambulatory Visit: Payer: 59

## 2019-07-16 VITALS — BP 121/72 | HR 91 | Temp 98.9°F | Resp 18 | Ht 60.0 in | Wt 135.1 lb

## 2019-07-16 DIAGNOSIS — Z923 Personal history of irradiation: Secondary | ICD-10-CM | POA: Insufficient documentation

## 2019-07-16 DIAGNOSIS — C7951 Secondary malignant neoplasm of bone: Secondary | ICD-10-CM | POA: Insufficient documentation

## 2019-07-16 DIAGNOSIS — Z9221 Personal history of antineoplastic chemotherapy: Secondary | ICD-10-CM | POA: Insufficient documentation

## 2019-07-16 DIAGNOSIS — Z170 Estrogen receptor positive status [ER+]: Secondary | ICD-10-CM | POA: Diagnosis not present

## 2019-07-16 DIAGNOSIS — Z79811 Long term (current) use of aromatase inhibitors: Secondary | ICD-10-CM | POA: Diagnosis not present

## 2019-07-16 DIAGNOSIS — C50311 Malignant neoplasm of lower-inner quadrant of right female breast: Secondary | ICD-10-CM | POA: Insufficient documentation

## 2019-07-16 DIAGNOSIS — R180 Malignant ascites: Secondary | ICD-10-CM | POA: Insufficient documentation

## 2019-07-16 DIAGNOSIS — C50919 Malignant neoplasm of unspecified site of unspecified female breast: Secondary | ICD-10-CM | POA: Diagnosis not present

## 2019-07-16 DIAGNOSIS — K760 Fatty (change of) liver, not elsewhere classified: Secondary | ICD-10-CM | POA: Diagnosis not present

## 2019-07-16 DIAGNOSIS — J910 Malignant pleural effusion: Secondary | ICD-10-CM | POA: Diagnosis not present

## 2019-07-16 DIAGNOSIS — Z79899 Other long term (current) drug therapy: Secondary | ICD-10-CM | POA: Insufficient documentation

## 2019-07-16 DIAGNOSIS — C787 Secondary malignant neoplasm of liver and intrahepatic bile duct: Secondary | ICD-10-CM | POA: Insufficient documentation

## 2019-07-16 LAB — CBC WITH DIFFERENTIAL (CANCER CENTER ONLY)
Abs Immature Granulocytes: 0.04 10*3/uL (ref 0.00–0.07)
Basophils Absolute: 0.1 10*3/uL (ref 0.0–0.1)
Basophils Relative: 2 %
Eosinophils Absolute: 0 10*3/uL (ref 0.0–0.5)
Eosinophils Relative: 1 %
HCT: 34.8 % — ABNORMAL LOW (ref 36.0–46.0)
Hemoglobin: 11.6 g/dL — ABNORMAL LOW (ref 12.0–15.0)
Immature Granulocytes: 1 %
Lymphocytes Relative: 24 %
Lymphs Abs: 1 10*3/uL (ref 0.7–4.0)
MCH: 36.5 pg — ABNORMAL HIGH (ref 26.0–34.0)
MCHC: 33.3 g/dL (ref 30.0–36.0)
MCV: 109.4 fL — ABNORMAL HIGH (ref 80.0–100.0)
Monocytes Absolute: 0.8 10*3/uL (ref 0.1–1.0)
Monocytes Relative: 20 %
Neutro Abs: 2.1 10*3/uL (ref 1.7–7.7)
Neutrophils Relative %: 52 %
Platelet Count: 73 10*3/uL — ABNORMAL LOW (ref 150–400)
RBC: 3.18 MIL/uL — ABNORMAL LOW (ref 3.87–5.11)
RDW: 17.1 % — ABNORMAL HIGH (ref 11.5–15.5)
WBC Count: 4 10*3/uL (ref 4.0–10.5)
nRBC: 0.7 % — ABNORMAL HIGH (ref 0.0–0.2)

## 2019-07-16 LAB — CMP (CANCER CENTER ONLY)
ALT: 43 U/L (ref 0–44)
AST: 89 U/L — ABNORMAL HIGH (ref 15–41)
Albumin: 2.1 g/dL — ABNORMAL LOW (ref 3.5–5.0)
Alkaline Phosphatase: 312 U/L — ABNORMAL HIGH (ref 38–126)
Anion gap: 8 (ref 5–15)
BUN: 7 mg/dL (ref 6–20)
CO2: 23 mmol/L (ref 22–32)
Calcium: 7.8 mg/dL — ABNORMAL LOW (ref 8.9–10.3)
Chloride: 102 mmol/L (ref 98–111)
Creatinine: 0.62 mg/dL (ref 0.44–1.00)
GFR, Est AFR Am: >60 mL/min (ref >60)
GFR, Estimated: >60 mL/min (ref >60)
Glucose, Bld: 195 mg/dL — ABNORMAL HIGH (ref 70–99)
Potassium: 4.2 mmol/L (ref 3.5–5.1)
Sodium: 133 mmol/L — ABNORMAL LOW (ref 135–145)
Total Bilirubin: 1.2 mg/dL (ref 0.3–1.2)
Total Protein: 5.7 g/dL — ABNORMAL LOW (ref 6.5–8.1)

## 2019-07-16 LAB — SAMPLE TO BLOOD BANK

## 2019-07-16 MED ORDER — SERTRALINE HCL 50 MG PO TABS: 50.0000 mg | ORAL_TABLET | Freq: Every day | ORAL | 3 refills | Status: AC

## 2019-07-16 MED ORDER — MORPHINE SULFATE 15 MG PO TABS: 15.0000 mg | ORAL_TABLET | Freq: Four times a day (QID) | ORAL | 0 refills | Status: AC | PRN

## 2019-07-16 MED ORDER — FULVESTRANT 250 MG/5ML IM SOLN
500.0000 mg | Freq: Once | INTRAMUSCULAR | Status: AC
  Administered 2019-07-16: 500 mg via INTRAMUSCULAR

## 2019-07-16 NOTE — Telephone Encounter (Signed)
This order was placed by Lattie Haw T on 07/15/2019, confirmation was received by our [facility] on 07/15/19. Pt is aware of this. She is going to contact the supplier and check the status of the order. Nothing further was needed.

## 2019-07-16 NOTE — Assessment & Plan Note (Signed)
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 Diffuse bone marrow hypermetabolism worrisome for metastatic disease. Prior pericardial effusion resolved.  01/16/2019:Pleural effusion: Thoracentesis revealed metastatic adenocarcinoma breast primary ER 75%, PR 50%, HER-2 -1+ Bone marrow biopsy positive for metastatic breast cancer  [facility] molecular testing: ER/PR positive HER-2 negative, ER positive, TMB low (1), BRCA 1 and 2 -, ESR 1 mutation not detected, PI K3 CA negative Guardant 360: T p53, no evidence of MSI high -------------------------------------------------------------------------------------------------------------------------------------------------------------- Current treatment: Letrozole 2.5 mg daily started 01/15/2019, Ibrance started 01/24/2019, Faslodex and Xgeva added 02/21/2019 Hospitalization: 02/03/19- 02/07/19 Malignant Pleural effusion S/P thoracentesisstatus post Pleurx catheter placement 03/11/2019  Plan: 1.Faslodex added to the treatment11/13/2020 2. Ibrance 75 mg, currently on 3 weeks on 2 weeks off regimen 3.Xgeva q 3 months  CT CAP 07/04/2019: Diminished pleural fluid, stable lymphangitic carcinomatosis, new lesion right hepatic lobe with worsening hepatic nodularity, liver displays cirrhotic morphology, bony metastases same as before.  Based on clinical improvement, I recommended continuation of the current treatment.  Although overall the CT scans show stable disease given the improvement in her blood counts I suspect that the bone marrow involvement  by her metastatic breast cancer is improving.  Recheck with scans again in 3 months.

## 2019-07-16 NOTE — Patient Instructions (Signed)
Fulvestrant injection What is this medicine? FULVESTRANT (ful VES trant) blocks the effects of estrogen. It is used to treat breast cancer. This medicine may be used for other purposes; ask your health care provider or pharmacist if you have questions. COMMON BRAND NAME(S): FASLODEX What should I tell my health care provider before I take this medicine? They need to know if you have any of these conditions:  bleeding disorders  liver disease  low blood counts, like low white cell, platelet, or red cell counts  an unusual or allergic reaction to fulvestrant, other medicines, foods, dyes, or preservatives  pregnant or trying to get pregnant  breast-feeding How should I use this medicine? This medicine is for injection into a muscle. It is usually given by a health care professional in a hospital or clinic setting. Talk to your pediatrician regarding the use of this medicine in children. Special care may be needed. Overdosage: If you think you have taken too much of this medicine contact a poison control center or emergency room at once. NOTE: This medicine is only for you. Do not share this medicine with others. What if I miss a dose? It is important not to miss your dose. Call your doctor or health care professional if you are unable to keep an appointment. What may interact with this medicine?  medicines that treat or prevent blood clots like warfarin, enoxaparin, dalteparin, apixaban, dabigatran, and rivaroxaban This list may not describe all possible interactions. Give your health care provider a list of all the medicines, herbs, non-prescription drugs, or dietary supplements you use. Also tell them if you smoke, drink alcohol, or use illegal drugs. Some items may interact with your medicine. What should I watch for while using this medicine? Your condition will be monitored carefully while you are receiving this medicine. You will need important blood work done while you are taking  this medicine. Do not become pregnant while taking this medicine or for at least 1 year after stopping it. Women of child-bearing potential will need to have a negative pregnancy test before starting this medicine. Women should inform their doctor if they wish to become pregnant or think they might be pregnant. There is a potential for serious side effects to an unborn child. Men should inform their doctors if they wish to father a child. This medicine may lower sperm counts. Talk to your health care professional or pharmacist for more information. Do not breast-feed an infant while taking this medicine or for 1 year after the last dose. What side effects may I notice from receiving this medicine? Side effects that you should report to your doctor or health care professional as soon as possible:  allergic reactions like skin rash, itching or hives, swelling of the face, lips, or tongue  feeling faint or lightheaded, falls  pain, tingling, numbness, or weakness in the legs  signs and symptoms of infection like fever or chills; cough; flu-like symptoms; sore throat  vaginal bleeding Side effects that usually do not require medical attention (report to your doctor or health care professional if they continue or are bothersome):  aches, pains  constipation  diarrhea  headache  hot flashes  nausea, vomiting  pain at site where injected  stomach pain This list may not describe all possible side effects. Call your doctor for medical advice about side effects. You may report side effects to FDA at 1-800-FDA-1088. Where should I keep my medicine? This drug is given in a hospital or clinic and will   not be stored at home. NOTE: This sheet is a summary. It may not cover all possible information. If you have questions about this medicine, talk to your doctor, pharmacist, or health care provider.  2020 Elsevier/Gold Standard (2017-07-05 11:34:41)  

## 2019-07-17 ENCOUNTER — Inpatient Hospital Stay: Payer: 59

## 2019-07-17 ENCOUNTER — Other Ambulatory Visit: Payer: Self-pay

## 2019-07-17 ENCOUNTER — Other Ambulatory Visit: Payer: 59

## 2019-07-17 ENCOUNTER — Ambulatory Visit: Payer: 59

## 2019-07-17 ENCOUNTER — Ambulatory Visit: Payer: 59 | Admitting: Hematology and Oncology

## 2019-07-17 ENCOUNTER — Ambulatory Visit (HOSPITAL_COMMUNITY)
Admission: RE | Admit: 2019-07-17 | Discharge: 2019-07-17 | Disposition: A | Discharge status: Home / Self Care | Payer: 59 | Source: Ambulatory Visit | Attending: Internal Medicine | Admitting: Internal Medicine

## 2019-07-17 DIAGNOSIS — R160 Hepatomegaly, not elsewhere classified: Secondary | ICD-10-CM | POA: Diagnosis present

## 2019-07-17 DIAGNOSIS — C50919 Malignant neoplasm of unspecified site of unspecified female breast: Secondary | ICD-10-CM

## 2019-07-17 DIAGNOSIS — R188 Other ascites: Secondary | ICD-10-CM | POA: Insufficient documentation

## 2019-07-17 DIAGNOSIS — C50311 Malignant neoplasm of lower-inner quadrant of right female breast: Secondary | ICD-10-CM | POA: Diagnosis not present

## 2019-07-17 MED ORDER — SODIUM CHLORIDE 0.9 % IV SOLN
INTRAVENOUS | Status: DC
  Filled 2019-07-17: qty 250

## 2019-07-17 MED ORDER — ALBUMIN HUMAN 25 % IV SOLN: 20.0000 g | Freq: Once | INTRAVENOUS | Status: DC

## 2019-07-17 MED ORDER — ALBUMIN HUMAN 25 % IV SOLN
25.0000 g | Freq: Once | INTRAVENOUS | Status: AC
  Administered 2019-07-17: 17:00:00 25 g via INTRAVENOUS

## 2019-07-17 MED ORDER — ALBUMIN HUMAN 25 % IV SOLN
INTRAVENOUS | Status: AC
  Filled 2019-07-17: qty 100

## 2019-07-17 MED ORDER — LIDOCAINE HCL 1 % IJ SOLN
INTRAMUSCULAR | Status: AC
  Filled 2019-07-17: qty 20

## 2019-07-17 NOTE — Patient Instructions (Addendum)
COVID-19 Vaccine Information can be found at: [email or web address].co.uk For questions related to vaccine distribution or appointments, please email [email or web address] or call [phone].   Albumin injection What is this medicine? ALBUMIN (al BYOO min) is used to treat or prevent shock following serious injury, bleeding, surgery, or burns by increasing the volume of blood plasma. This medicine can also replace low blood protein. This medicine may be used for other purposes; ask your health care provider or pharmacist if you have questions. COMMON BRAND NAME(S): Albuked, Albumarc, Albuminar, Albuminex, AlbuRx, Albutein, Buminate, Flexbumin, Kedbumin, Macrotec, Plasbumin What should I tell my health care provider before I take this medicine? They need to know if you have any of the following conditions:  anemia  heart disease  kidney disease  an unusual or allergic reaction to albumin, other medicines, foods, dyes, or preservatives  pregnant or trying to get pregnant  breast-feeding How should I use this medicine? This medicine is for infusion into a vein. It is given by a health-care professional in a hospital or clinic. Talk to your pediatrician regarding the use of this medicine in children. While this drug may be prescribed for selected conditions, precautions do apply. Overdosage: If you think you have taken too much of this medicine contact a poison control center or emergency room at once. NOTE: This medicine is only for you. Do not share this medicine with others. What if I miss a dose? This does not apply. What may interact with this medicine? Interactions are not expected. This list may not describe all possible interactions. Give your health care provider a list of all the medicines, herbs, non-prescription drugs, or dietary supplements you use. Also tell them if you smoke, drink alcohol, or use illegal drugs. Some items  may interact with your medicine. What should I watch for while using this medicine? Your condition will be closely monitored while you receive this medicine. Some products are derived from human plasma, and there is a small risk that these products may contain certain types of virus or bacteria. All products are processed to kill most viruses and bacteria. If you have questions concerning the risk of infections, discuss them with your doctor or health care professional. What side effects may I notice from receiving this medicine? Side effects that you should report to your doctor or health care professional as soon as possible:  allergic reactions like skin rash, itching or hives, swelling of the face, lips, or tongue  breathing problems  changes in heartbeat  fever, chills  pain, redness or swelling at the injection site  signs of viral infection including fever, drowsiness, chills, runny nose followed in about 2 weeks by a rash and joint pain  tightness in the chest Side effects that usually do not require medical attention (report to your doctor or health care professional if they continue or are bothersome):  increased salivation  nausea, vomiting This list may not describe all possible side effects. Call your doctor for medical advice about side effects. You may report side effects to FDA at 1-800-FDA-1088. Where should I keep my medicine? This does not apply. You will not be given this medicine to store at home. NOTE: This sheet is a summary. It may not cover all possible information. If you have questions about this medicine, talk to your doctor, pharmacist, or health care provider.  2020 Elsevier/Gold Standard (2007-06-20 10:18:55)  Coronavirus (COVID-19) Are you at risk?  Are you at risk for the Coronavirus (COVID-19)?  To be considered HIGH RISK  for Coronavirus (COVID-19), you have to meet the following criteria:  . Traveled to [place], [place], [place], [place] or [place];  or in the [place] to [place], [place], [place], or [place]; and have fever, cough, and shortness of breath within the last 2 weeks of travel OR . Been in close contact with a person diagnosed with COVID-19 within the last 2 weeks and have fever, cough, and shortness of breath . IF YOU DO NOT MEET THESE CRITERIA, YOU ARE CONSIDERED LOW RISK FOR COVID-19.  What to do if you are HIGH RISK for COVID-19?  Marland Kitchen If you are having a medical emergency, call 911. . Seek medical care right away. Before you go to a doctor's office, urgent care or emergency department, call ahead and tell them about your recent travel, contact with someone diagnosed with COVID-19, and your symptoms. You should receive instructions from your physician's office regarding next steps of care.  . When you arrive at healthcare provider, tell the healthcare staff immediately you have returned from visiting [place], [place], [place], [place] or [place]; or traveled in the [place] to [place], [place], [place], or [place]; in the last two weeks or you have been in close contact with a person diagnosed with COVID-19 in the last 2 weeks.   . Tell the health care staff about your symptoms: fever, cough and shortness of breath. . After you have been seen by a medical provider, you will be either: o Tested for (COVID-19) and discharged home on quarantine except to seek medical care if symptoms worsen, and asked to  - Stay home and avoid contact with others until you get your results (4-5 days)  - Avoid travel on public transportation if possible (such as bus, train, or airplane) or o Sent to the Emergency Department by EMS for evaluation, COVID-19 testing, and possible admission depending on your condition and test results.  What to do if you are LOW RISK for COVID-19?  Reduce your risk of any infection by using the same precautions used for avoiding the common cold or flu:  Marland Kitchen Wash your hands often with soap and  warm water for at least 20 seconds.  If soap and water are not readily available, use an alcohol-based hand sanitizer with at least 60% alcohol.  . If coughing or sneezing, cover your mouth and nose by coughing or sneezing into the elbow areas of your shirt or coat, into a tissue or into your sleeve (not your hands). . Avoid shaking hands with others and consider head nods or verbal greetings only. . Avoid touching your eyes, nose, or mouth with unwashed hands.  . Avoid close contact with people who are sick. . Avoid places or events with large numbers of people in one location, like concerts or sporting events. . Carefully consider travel plans you have or are making. . If you are planning any travel outside or inside the [place], visit the CDC's Travelers' Health webpage for the latest health notices. . If you have some symptoms but not all symptoms, continue to monitor at home and seek medical attention if your symptoms worsen. . If you are having a medical emergency, call 911.   [facility] / e-Visit: [email or web address]         [facility]: [facility]: [phone]  [facility]: [phone]

## 2019-07-18 ENCOUNTER — Other Ambulatory Visit: Payer: Self-pay | Admitting: Hematology and Oncology

## 2019-07-18 DIAGNOSIS — C50919 Malignant neoplasm of unspecified site of unspecified female breast: Secondary | ICD-10-CM

## 2019-07-18 LAB — CYTOLOGY - NON PAP

## 2019-07-21 ENCOUNTER — Telehealth: Payer: Self-pay | Admitting: Hematology and Oncology

## 2019-07-21 NOTE — Telephone Encounter (Signed)
Scheduled per los, called patient regarding upcoming appointments and patient is notified.

## 2019-07-23 ENCOUNTER — Telehealth: Payer: Self-pay | Admitting: Internal Medicine

## 2019-07-23 NOTE — Telephone Encounter (Signed)
Change to Monday/Friday Call when draining less than 150 each time on this schedule and we can schedule removal.

## 2019-07-23 NOTE — Telephone Encounter (Signed)
While on the phone with the patient about a different matter, she stated that she needed to speak with [facility]. When I asked for more information, she stated that it was for her O2 that we have been trying to get her for the past 6 months and [facility] knows all about it. She is requesting to speak with [facility] only.   Libby, please advise. Thanks!

## 2019-07-23 NOTE — Telephone Encounter (Signed)
Spoke to [name]  she is checking on this small 02 system for this pt and will call me back pt is aware Jasmine Allen

## 2019-07-23 NOTE — Telephone Encounter (Signed)
Spoke with patient. She verbalized understanding but wanted to know many times should she pull 128mL on Mondays and Fridays before she calls our office.   Dr. Tamala Julian, please advise. Thanks!

## 2019-07-23 NOTE — Telephone Encounter (Signed)
Spoke with patient. She gave me the following drainage readings from her catheter:  4/4-200mL 4/5-400mL 4/7-210mL 4/9-150mL 4/10-150mL 4/12-110mL 4/14-100mL  She stated that when she pulled the fluid off today, she noticed blood than usual. She did not notice any redness or bleeding at the actual site. She did not have any pain. She did notice that she was getting a lot more resistance today. She is wondering if it is time to change the frequency of her drainings or if it is time for the catheter to be removed.   I advised her that I would send a message over to Dr. Tamala Julian for her, she verbalized understanding.   Dr. Tamala Julian, please advise. Thanks!

## 2019-07-23 NOTE — Telephone Encounter (Signed)
Spoke with patient. She is aware of the 3 times and verbalized understanding. Nothing further needed at time of call.

## 2019-07-23 NOTE — Telephone Encounter (Signed)
3 times thanks

## 2019-07-25 ENCOUNTER — Other Ambulatory Visit: Payer: Self-pay | Admitting: Internal Medicine

## 2019-07-25 ENCOUNTER — Telehealth: Payer: Self-pay | Admitting: Internal Medicine

## 2019-07-25 DIAGNOSIS — R180 Malignant ascites: Secondary | ICD-10-CM

## 2019-07-25 NOTE — Telephone Encounter (Signed)
Pt calling back concerning O2 tank

## 2019-07-25 NOTE — Telephone Encounter (Signed)
Called patient Fluid building back up in stomach. 1 week since last paracentesis. Will set up for drainage qweek. May need abdominal pleurX, defer to Dr. Lindi Adie.

## 2019-07-25 NOTE — Telephone Encounter (Signed)
Spoke with patient. She stated that she was calling back to follow up on her O2 request that [facility] was working on 2 days ago. I advised her that I spoke with [facility] this morning and she is still awaiting a call back from [place]. Patient is aware of this.   While on the phone, she stated that the fluid has started to return since she decreased her draining 2 days ago. Increased chest pressure. She denied any increased coughing. She is still using her O2. She sounded very tearful on the phone and stated that she does not want to go back to the hospital. I asked if her if something else was going on, she stated that she just does not feel well today.   Dr. Tamala Julian, please advise. Thanks!

## 2019-07-28 ENCOUNTER — Other Ambulatory Visit: Payer: Self-pay

## 2019-07-28 ENCOUNTER — Telehealth: Payer: Self-pay | Admitting: Internal Medicine

## 2019-07-28 ENCOUNTER — Ambulatory Visit (HOSPITAL_COMMUNITY)
Admission: RE | Admit: 2019-07-28 | Discharge: 2019-07-28 | Disposition: A | Discharge status: Home / Self Care | Payer: 59 | Source: Ambulatory Visit | Attending: Hematology and Oncology | Admitting: Hematology and Oncology

## 2019-07-28 ENCOUNTER — Ambulatory Visit (HOSPITAL_COMMUNITY)
Admission: RE | Admit: 2019-07-28 | Discharge: 2019-07-28 | Disposition: A | Discharge status: Home / Self Care | Payer: 59 | Source: Ambulatory Visit | Attending: Internal Medicine | Admitting: Internal Medicine

## 2019-07-28 DIAGNOSIS — C801 Malignant (primary) neoplasm, unspecified: Secondary | ICD-10-CM | POA: Diagnosis not present

## 2019-07-28 DIAGNOSIS — R180 Malignant ascites: Secondary | ICD-10-CM | POA: Insufficient documentation

## 2019-07-28 DIAGNOSIS — K760 Fatty (change of) liver, not elsewhere classified: Secondary | ICD-10-CM

## 2019-07-28 HISTORY — PX: IR PARACENTESIS: IMG2679

## 2019-07-28 MED ORDER — LIDOCAINE HCL 1 % IJ SOLN
INTRAMUSCULAR | Status: AC
  Filled 2019-07-28: qty 20

## 2019-07-28 MED ORDER — LIDOCAINE HCL (PF) 1 % IJ SOLN
INTRAMUSCULAR | Status: DC | PRN
  Administered 2019-07-28: 10 mL

## 2019-07-28 NOTE — Procedures (Signed)
PROCEDURE SUMMARY:  Successful image-guided paracentesis from the left lower abdomen.  Yielded 4.0 liters of clear, pale yellow fluid.  No immediate complications.  EBL: zero Patient tolerated well.   Specimen was not sent for labs.  Please see imaging section of Epic for full dictation.  Joaquim Nam PA-C 07/28/2019 1:22 PM

## 2019-07-28 NOTE — Telephone Encounter (Signed)
I spoke with the pt  She states has o2 already and just needs appt for walk to qualify for POC  Pt just seen recently, so scheduled her for 08/01/19 at 2 pm per her request for this walk

## 2019-07-29 ENCOUNTER — Other Ambulatory Visit: Payer: Self-pay | Admitting: Hematology and Oncology

## 2019-07-29 ENCOUNTER — Ambulatory Visit (HOSPITAL_COMMUNITY)
Admission: RE | Admit: 2019-07-29 | Discharge: 2019-07-29 | Disposition: A | Discharge status: Home / Self Care | Payer: 59 | Source: Ambulatory Visit | Attending: Hematology and Oncology | Admitting: Hematology and Oncology

## 2019-07-29 DIAGNOSIS — K760 Fatty (change of) liver, not elsewhere classified: Secondary | ICD-10-CM | POA: Diagnosis not present

## 2019-07-29 MED ORDER — BUMETANIDE 2 MG PO TABS: 2.0000 mg | ORAL_TABLET | Freq: Every day | ORAL | 3 refills | Status: AC

## 2019-07-29 MED ORDER — PROPRANOLOL HCL ER 80 MG PO CP24: 80.0000 mg | ORAL_CAPSULE | Freq: Every day | ORAL | 3 refills | Status: AC

## 2019-07-29 MED ORDER — GADOBUTROL 1 MMOL/ML IV SOLN
6.0000 mL | Freq: Once | INTRAVENOUS | Status: AC | PRN
  Administered 2019-07-29: 6 mL via INTRAVENOUS

## 2019-07-30 ENCOUNTER — Ambulatory Visit (HOSPITAL_COMMUNITY): Payer: 59

## 2019-07-31 ENCOUNTER — Other Ambulatory Visit: Payer: Self-pay

## 2019-07-31 ENCOUNTER — Telehealth: Payer: Self-pay

## 2019-07-31 ENCOUNTER — Inpatient Hospital Stay: Payer: 59

## 2019-07-31 ENCOUNTER — Inpatient Hospital Stay (HOSPITAL_BASED_OUTPATIENT_CLINIC_OR_DEPARTMENT_OTHER): Payer: 59 | Admitting: Hematology and Oncology

## 2019-07-31 DIAGNOSIS — E7220 Disorder of urea cycle metabolism, unspecified: Secondary | ICD-10-CM | POA: Diagnosis not present

## 2019-07-31 DIAGNOSIS — C50919 Malignant neoplasm of unspecified site of unspecified female breast: Secondary | ICD-10-CM

## 2019-07-31 DIAGNOSIS — Z170 Estrogen receptor positive status [ER+]: Secondary | ICD-10-CM

## 2019-07-31 DIAGNOSIS — C50311 Malignant neoplasm of lower-inner quadrant of right female breast: Secondary | ICD-10-CM

## 2019-07-31 LAB — CBC WITH DIFFERENTIAL (CANCER CENTER ONLY)
Abs Immature Granulocytes: 0.01 10*3/uL (ref 0.00–0.07)
Basophils Absolute: 0 10*3/uL (ref 0.0–0.1)
Basophils Relative: 0 %
Eosinophils Absolute: 0 10*3/uL (ref 0.0–0.5)
Eosinophils Relative: 1 %
HCT: 38.4 % (ref 36.0–46.0)
Hemoglobin: 12.7 g/dL (ref 12.0–15.0)
Immature Granulocytes: 0 %
Lymphocytes Relative: 23 %
Lymphs Abs: 1.1 10*3/uL (ref 0.7–4.0)
MCH: 35.5 pg — ABNORMAL HIGH (ref 26.0–34.0)
MCHC: 33.1 g/dL (ref 30.0–36.0)
MCV: 107.3 fL — ABNORMAL HIGH (ref 80.0–100.0)
Monocytes Absolute: 0.3 10*3/uL (ref 0.1–1.0)
Monocytes Relative: 5 %
Neutro Abs: 3.6 10*3/uL (ref 1.7–7.7)
Neutrophils Relative %: 71 %
Platelet Count: 48 10*3/uL — ABNORMAL LOW (ref 150–400)
RBC: 3.58 MIL/uL — ABNORMAL LOW (ref 3.87–5.11)
RDW: 17.3 % — ABNORMAL HIGH (ref 11.5–15.5)
WBC Count: 5 10*3/uL (ref 4.0–10.5)
nRBC: 0 % (ref 0.0–0.2)

## 2019-07-31 LAB — CMP (CANCER CENTER ONLY)
ALT: 67 U/L — ABNORMAL HIGH (ref 0–44)
AST: 77 U/L — ABNORMAL HIGH (ref 15–41)
Albumin: 2.1 g/dL — ABNORMAL LOW (ref 3.5–5.0)
Alkaline Phosphatase: 323 U/L — ABNORMAL HIGH (ref 38–126)
Anion gap: 9 (ref 5–15)
BUN: 10 mg/dL (ref 6–20)
CO2: 31 mmol/L (ref 22–32)
Calcium: 7.2 mg/dL — ABNORMAL LOW (ref 8.9–10.3)
Chloride: 98 mmol/L (ref 98–111)
Creatinine: 0.7 mg/dL (ref 0.44–1.00)
GFR, Est AFR Am: >60 mL/min (ref >60)
GFR, Estimated: >60 mL/min (ref >60)
Glucose, Bld: 242 mg/dL — ABNORMAL HIGH (ref 70–99)
Potassium: 3.3 mmol/L — ABNORMAL LOW (ref 3.5–5.1)
Sodium: 138 mmol/L (ref 135–145)
Total Bilirubin: 1.2 mg/dL (ref 0.3–1.2)
Total Protein: 5.4 g/dL — ABNORMAL LOW (ref 6.5–8.1)

## 2019-07-31 LAB — AMMONIA: Ammonia: 46 umol/L — ABNORMAL HIGH (ref 9–35)

## 2019-07-31 LAB — SAMPLE TO BLOOD BANK

## 2019-07-31 MED ORDER — LACTULOSE 20 G PO PACK: 20.0000 g | PACK | Freq: Three times a day (TID) | ORAL | 5 refills | Status: AC

## 2019-07-31 NOTE — Progress Notes (Signed)
Hospice referral initiated with [facility].  Amber notified, will contact patient/patient's spouse to start process.

## 2019-07-31 NOTE — Telephone Encounter (Signed)
Pt's husband called to report changes with patient.  Per husband patient is showing increased confusion, experiencing muscle spasms, and swelling to abdominal area X 3 days.   No shortness of breath.    Pt had paracentesis on 4/19, 4L removed.    MD recommendations for pt to have CBC, CMP, and Ammonia levels check and follow up.   RN notified husband, appointments made, verbalized understanding and agreement.

## 2019-07-31 NOTE — Assessment & Plan Note (Signed)
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 Diffuse bone marrow hypermetabolism worrisome for metastatic disease. Prior pericardial effusion resolved.  01/16/2019:Pleural effusion: Thoracentesis revealed metastatic adenocarcinoma breast primary ER 75%, PR 50%, HER-2 -1+ Bone marrow biopsy positive for metastatic breast cancer  [facility] molecular testing: ER/PR positive HER-2 negative, ER positive, TMB low (1), BRCA 1 and 2 -, ESR 1 mutation not detected, PI K3 CA negative Guardant 360: T p53, no evidence of MSI high -------------------------------------------------------------------------------------------------------------------------------------------------------------- Current treatment: Letrozole 2.5 mg daily started 01/15/2019, Ibrance started 01/24/2019, Faslodex and Xgeva added 02/21/2019 Hospitalization: 02/03/19- 02/07/19 Malignant Pleural effusion S/P thoracentesisstatus post Pleurx catheter placement 03/11/2019  Plan: 1.Faslodex added to the treatment11/13/2020 2. Ibrance 75 mg, currently on 3 weeks on 2 weeks off regimen 3.Xgeva q 3 months  CT CAP 07/04/2019: Diminished pleural fluid, stable lymphangitic carcinomatosis, new lesion right hepatic lobe with worsening hepatic nodularity, liver displays cirrhotic morphology, bony metastases same as before. ------------------------------------------------------------------------------------------------------------------------------------------------- MRI abdomen 07/29/2019: Wedge shaped abnormality right hepatic lobe could  be ischemic, diffuse bone involvement, pseudocirrhosis of the liver, anasarca with ascites  Acute confusion: I am worried about hepatic encephalopathy.  We will obtain ammonia levels today.  She is already on lactulose.  I started her on propranolol for portal hypertension. Recurrent ascites: Requiring frequent paracentesis.  I discussed with the patient and her husband that continued problems with liver dysfunction could lead to more episodes of acute confusion and hepatic encephalopathy.  Unfortunately there is no cure for her situation.  We can try to increase the lactulose with the hope of decreasing the serum ammonia levels.  Palliative care

## 2019-07-31 NOTE — Progress Notes (Signed)
Patient Care Team: Nicholas Lose, MD as PCP - General (Hematology and Oncology) Nicholas Lose, MD as Consulting Physician (Hematology and Oncology) Kyung Rudd, MD as Consulting Physician (Radiation Oncology) Rolm Bookbinder, MD as Consulting Physician (General Surgery) Julianne Handler, NP as Nurse Practitioner [facility] and Palliative Medicine)  DIAGNOSIS:  Encounter Diagnoses  Name Primary?  . Malignant neoplasm of lower-inner quadrant of right breast of female, estrogen receptor positive ([place])   . Hyperammonemia ([place])     SUMMARY OF ONCOLOGIC HISTORY: Oncology History  Malignant neoplasm of lower-inner quadrant of right breast of female, estrogen receptor positive ([place])  08/16/2017 Initial Diagnosis   Right lumpectomy: 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  High risk   08/16/2017 Cancer Staging   Staging form: Breast, AJCC 8th Edition - Pathologic stage from 08/16/2017: Stage IA (pT1c, pN0, cM0, G2, ER+, PR+, HER2-, Oncotype DX score: 31) - Signed by Gardenia Phlegm, NP on 09/12/2018   09/25/2017 - 02/05/2018 Chemotherapy   Adjuvant chemotherapy with dose dense Adriamycin and Cytoxan x4 followed by Taxol weekly x12    03/11/2018 - 04/25/2018 Radiation Therapy   Adjuvant XRT   01/09/2019 PET scan   Pulmonary hypermetabolic and consistent with lymphangitic tumor spread, confluent right middle lobe consolidation, hypermetabolic abdominal, cervical and left axillary lymph nodes, diffuse liver hypermetabolic some nonspecific.  Diffuse bone marrow hypermetabolism worrisome for metastatic disease.  Prior pericardial effusion resolved.   01/16/2019 Relapse/Recurrence   Pleural effusion: Thoracentesis revealed metastatic adenocarcinoma breast  primary ER 75%, PR 50%, HER-2 -1+ Bone marrow biopsy positive for metastatic breast cancer     CHIEF COMPLIANT: Progressive deterioration of performance status, acute confusion  INTERVAL HISTORY: Jasmine Allen is a 54 year old with above-mentioned for metastatic breast cancer and pseudocirrhosis of the liver, who is been progressing steadily and her performance status has been declining rapidly over the past several weeks.  She has had recurrent ascites for which she had paracentesis done.  Lately she has been becoming more more confused and sleeping a lot.  Her husband brought her in urgently to see [place] today to discuss her prognosis and the next steps in her treatment.  Patient is awake but not completely oriented to time or place.  She answers some questions appropriately.  She is in a wheelchair today.   ALLERGIES:  is allergic to amoxicillin-pot clavulanate; erythromycin; metoclopramide hcl; and sulfonamide derivatives.  MEDICATIONS:  Current Outpatient Medications  Medication Sig Dispense Refill  . ALPRAZolam (XANAX) 0.25 MG tablet Take 1 tablet (0.25 mg total) by mouth at bedtime as needed for anxiety. Takes one dose at bedtime for insomnia 30 tablet 3  . bumetanide (BUMEX) 2 MG tablet Take 1 tablet (2 mg total) by mouth daily. 60 tablet 3  . cyclobenzaprine (FLEXERIL) 10 MG tablet Take 10 mg by mouth every 8 (eight) hours as needed for muscle spasms.  1  . hydrOXYzine (ATARAX/VISTARIL) 10 MG tablet Take 10-30 mg by mouth at bedtime as needed for itching.   0  . lactulose (CEPHULAC) 20 g packet Take 1 packet (20 g total) by mouth 3 (three) times daily. 90 each 5  . LORazepam (ATIVAN) 0.5 MG tablet TAKE 1 TABLET(0.5 MG) BY MOUTH EVERY 8 HOURS 30 tablet 0  .  morphine (MS CONTIN) 15 MG 12 hr tablet Take 1 tablet (15 mg total) by mouth every 12 (twelve) hours. 60 tablet 0  . morphine (MSIR) 15 MG tablet Take 1 tablet (15 mg total) by mouth every 6 (six) hours as needed for severe pain. 60  tablet 0  . pantoprazole (PROTONIX) 40 MG tablet Take 1 tablet (40 mg total) by mouth 2 (two) times daily before a meal. (Patient taking differently: Take 40 mg by mouth 2 (two) times daily. ) 60 tablet 6  . potassium chloride SA (KLOR-CON) 20 MEQ tablet Take 2 tablets (40 mEq total) by mouth daily. TAKE 1 TABLET(20 MEQ) BY MOUTH DAILY (Patient taking differently: Take 40 mEq by mouth daily. Currently taking bid) 60 tablet 6  . prochlorperazine (COMPAZINE) 10 MG tablet Take 1 tablet (10 mg total) by mouth every 6 (six) hours as needed for nausea or vomiting. 30 tablet 0  . propranolol ER (INDERAL LA) 80 MG 24 hr capsule Take 1 capsule (80 mg total) by mouth daily. 90 capsule 3  . sertraline (ZOLOFT) 50 MG tablet Take 1 tablet (50 mg total) by mouth daily. 90 tablet 3  . traZODone (DESYREL) 100 MG tablet Take 150 mg by mouth at bedtime.     . valACYclovir (VALTREX) 1000 MG tablet Take by mouth. Prn fever blisters     No current facility-administered medications for this visit.    PHYSICAL EXAMINATION: ECOG PERFORMANCE STATUS: 3 - Symptomatic, >50% confined to bed  Vitals:   07/31/19 1306  BP: 134/71  Pulse: 63  Resp: 16  Temp: 98.7 F (37.1 C)  SpO2: 99%   There were no vitals filed for this visit.    LABORATORY DATA:  I have reviewed the data as listed CMP Latest Ref Rng & Units 07/31/2019 07/16/2019 07/09/2019  Glucose 70 - 99 mg/dL 242(H) 195(H) 115(H)  BUN 6 - 20 mg/dL _0 Creatinine 0.44 - 1.00 mg/dL 0.70 0.62 0.48  Sodium 135 - 145 mmol/L 138 133(L) 136  Potassium 3.5 - 5.1 mmol/L 3.3(L) 4.2 3.4(L)  Chloride 98 - 111 mmol/L 98 102 102  CO2 22 - 32 mmol/L _1 Calcium 8.9 - 10.3 mg/dL 7.2(L) 7.8(L) 7.7(L)  Total Protein 6.5 - 8.1 g/dL 5.4(L) 5.7(L) 5.7(L)  Total Bilirubin 0.3 - 1.2 mg/dL 1.2 1.2 0.9  Alkaline Phos 38 - 126 U/L 323(H) 312(H) 211(H)  AST 15 - 41 U/L 77(H) 89(H) 76(H)  ALT 0 - 44 U/L 67(H) 43 40    Lab Results  Component Value Date   WBC 5.0  07/31/2019   HGB 12.7 07/31/2019   HCT 38.4 07/31/2019   MCV 107.3 (H) 07/31/2019   PLT 48 (L) 07/31/2019   NEUTROABS 3.6 07/31/2019    ASSESSMENT & PLAN:  Malignant neoplasm of lower-inner quadrant of right breast of female, estrogen receptor positive ([facility]) 08/16/2017:Right lumpectomy: Grade 2 IDC 1.5 cm, with DCIS and necrosis, 0/3 lymph nodes negative, ER 95%, PR 30%, HER-2 negative ratio 1.14, Ki-67 40%, T1CN0 stage Ia; resection of the margin 09/11/2017: Benign  01/09/2019: PET CT scan:Pulmonary hypermetabolic and consistent with lymphangitic tumor spread, confluent right middle lobe consolidation, hypermetabolic abdominal, cervical and left axillary lymph nodes, diffuse liver hypermetabolic some nonspecific. Diffuse bone marrow hypermetabolism worrisome for metastatic disease. Prior pericardial effusion resolved.  01/16/2019:Pleural effusion: Thoracentesis revealed metastatic adenocarcinoma breast primary ER 75%, PR 50%, HER-2 -1+ Bone marrow biopsy positive for metastatic breast cancer  [facility] molecular testing: ER/PR positive HER-2  negative, ER positive, TMB low (1), BRCA 1 and 2 -, ESR 1 mutation not detected, PI K3 CA negative Guardant 360: T p53, no evidence of MSI high -------------------------------------------------------------------------------------------------------------------------------------------------------------- Current treatment: Letrozole 2.5 mg daily started 01/15/2019, Ibrance started 01/24/2019, Faslodex and Xgeva added 02/21/2019 Hospitalization: 02/03/19- 02/07/19 Malignant Pleural effusion S/P thoracentesisstatus post Pleurx catheter placement 03/11/2019  Plan: 1.Faslodex added to the treatment11/13/2020 2. Ibrance 75 mg, currently on 3 weeks on 2 weeks off regimen 3.Xgeva q 3 months  CT CAP 07/04/2019: Diminished pleural fluid, stable lymphangitic carcinomatosis, new lesion right hepatic lobe with worsening hepatic nodularity, liver displays  cirrhotic morphology, bony metastases same as before. ------------------------------------------------------------------------------------------------------------------------------------------------- MRI abdomen 07/29/2019: Wedge shaped abnormality right hepatic lobe could be ischemic, diffuse bone involvement, pseudocirrhosis of the liver, anasarca with ascites  Acute confusion: I am worried about hepatic encephalopathy.  We will obtain ammonia levels today.  She is already on lactulose.  I started her on propranolol for portal hypertension.  I instructed her husband to increase the lactulose to 4 times a day. Recurrent ascites: Requiring frequent paracentesis.  I discussed with the patient and her husband that continued problems with liver dysfunction could lead to more episodes of acute confusion and hepatic encephalopathy.  Unfortunately there is no cure for her situation.  We can try to increase the lactulose with the hope of decreasing the serum ammonia levels.  Goals of care counseling: I informed the family that she is going to die very soon because of her liver failure.  Given the rapid decline in performance status, I recommended hospice care.  I provided support and counseling to the family towards end-of-life care.  I instructed them to discontinue Ibrance and letrozole and any other medication that is not providing comfort care.   No orders of the defined types were placed in this encounter.  The patient has a good understanding of the overall plan. she agrees with it. she will call with any problems that may develop before the next visit here. Total time spent: 30 mins including face to face time and time spent for planning, charting and co-ordination of care   Harriette Ohara, MD 07/31/19

## 2019-08-01 ENCOUNTER — Ambulatory Visit: Payer: 59

## 2019-08-01 ENCOUNTER — Other Ambulatory Visit: Payer: Self-pay | Admitting: Hematology and Oncology

## 2019-08-04 ENCOUNTER — Other Ambulatory Visit: Payer: Self-pay

## 2019-08-04 ENCOUNTER — Telehealth: Payer: Self-pay | Admitting: Internal Medicine

## 2019-08-04 MED ORDER — LORAZEPAM 0.5 MG PO TABS: ORAL_TABLET | ORAL | 0 refills | Status: AC

## 2019-08-04 NOTE — Telephone Encounter (Signed)
Attempted to call pt's husband Grayland Ormond but unable to reach. Left message for him to return call.

## 2019-08-04 NOTE — Telephone Encounter (Signed)
Called and spoke with pt's husband Grayland Ormond who stated that pt is being moved to hospice. Grayland Ormond wants to know if they need to continue to drain pt's cath.  Dr. Tamala Julian, please advise on this for pt and husband Grayland Ormond if they need to continue to drain it or if you think that hospice will probably take over doing this?  Grayland Ormond stated that if you are able to,, they would like for you to give them a call.

## 2019-08-04 NOTE — Telephone Encounter (Signed)
If it helps her breathing then yes, if there is no change with her breathing than she does not need to drain it.

## 2019-08-05 ENCOUNTER — Ambulatory Visit: Payer: 59

## 2019-08-06 NOTE — Telephone Encounter (Signed)
Spoke with pt's husband, Grayland Ormond. He is aware of Dr. Thompson Caul response. Grayland Ormond states that they went to see her oncologist last week and they told them that her liver is failing and that she probably only has 4-5 weeks to live.

## 2019-08-11 ENCOUNTER — Other Ambulatory Visit: Payer: Self-pay | Admitting: Hematology and Oncology

## 2019-08-11 MED ORDER — MORPHINE SULFATE ER 15 MG PO TBCR: 15.0000 mg | EXTENDED_RELEASE_TABLET | Freq: Two times a day (BID) | ORAL | 0 refills | Status: AC

## 2019-08-13 ENCOUNTER — Ambulatory Visit (HOSPITAL_COMMUNITY)
Admission: RE | Admit: 2019-08-13 | Discharge: 2019-08-13 | Disposition: A | Discharge status: Home / Self Care | Payer: 59 | Source: Ambulatory Visit | Attending: Hematology and Oncology | Admitting: Hematology and Oncology

## 2019-08-13 ENCOUNTER — Other Ambulatory Visit: Payer: Self-pay

## 2019-08-13 ENCOUNTER — Encounter (HOSPITAL_COMMUNITY): Payer: Self-pay

## 2019-08-13 ENCOUNTER — Other Ambulatory Visit: Payer: Self-pay | Admitting: Hematology and Oncology

## 2019-08-13 DIAGNOSIS — R188 Other ascites: Secondary | ICD-10-CM

## 2019-08-13 DIAGNOSIS — C50919 Malignant neoplasm of unspecified site of unspecified female breast: Secondary | ICD-10-CM

## 2019-08-13 NOTE — Progress Notes (Signed)
Orders entered per MD recommendations for paracentesis

## 2019-08-14 ENCOUNTER — Other Ambulatory Visit: Payer: 59

## 2019-08-14 ENCOUNTER — Inpatient Hospital Stay: Payer: 59 | Admitting: Hematology and Oncology

## 2019-08-14 ENCOUNTER — Ambulatory Visit: Payer: 59

## 2019-08-15 ENCOUNTER — Other Ambulatory Visit: Payer: 59 | Admitting: Internal Medicine

## 2019-08-15 ENCOUNTER — Other Ambulatory Visit: Payer: Self-pay

## 2019-08-19 ENCOUNTER — Other Ambulatory Visit: Payer: Self-pay | Admitting: *Deleted

## 2019-08-19 DIAGNOSIS — C50919 Malignant neoplasm of unspecified site of unspecified female breast: Secondary | ICD-10-CM

## 2019-08-19 NOTE — Progress Notes (Signed)
Received call from pt stating she is experiencing abdominal distention and requesting paracentesis to be preformed.  Per MD okay to schedule pt for procedure.  Procedure scheduled and pt verbalized understanding of apt date and time.

## 2019-08-21 ENCOUNTER — Ambulatory Visit (HOSPITAL_COMMUNITY)
Admission: RE | Admit: 2019-08-21 | Discharge: 2019-08-21 | Disposition: A | Discharge status: Home / Self Care | Payer: 59 | Source: Ambulatory Visit | Attending: Hematology and Oncology | Admitting: Hematology and Oncology

## 2019-08-21 ENCOUNTER — Other Ambulatory Visit: Payer: Self-pay

## 2019-08-21 ENCOUNTER — Encounter (HOSPITAL_COMMUNITY): Payer: Self-pay

## 2019-08-21 DIAGNOSIS — R188 Other ascites: Secondary | ICD-10-CM | POA: Insufficient documentation

## 2019-08-21 DIAGNOSIS — C50919 Malignant neoplasm of unspecified site of unspecified female breast: Secondary | ICD-10-CM

## 2019-08-21 NOTE — Procedures (Signed)
PreOperative Dx: Metastatic breast cancer, ascites,  Postoperative Dx: Metastatic breast cancer, ascites Procedure:   US guided paracentesis Radiologist:  Thornton Papas Anesthesia:  10 ml of1% lidocaine Specimen:  2.0 L of yellow ascitic fluid EBL:   < 1 ml Complications: None

## 2019-08-21 NOTE — Progress Notes (Signed)
Paracentesis complete no signs of distress.  

## 2019-08-22 ENCOUNTER — Other Ambulatory Visit (HOSPITAL_COMMUNITY): Payer: 59

## 2019-08-27 ENCOUNTER — Ambulatory Visit: Payer: 59 | Admitting: Internal Medicine

## 2019-08-27 NOTE — Progress Notes (Deleted)
Pulmonary Office Followup Note 08/27/19  Seen in f/u for malignant effusion  S: 54 year old woman with metastatic breast cancer with mets to bone, lungs, liver, and bilateral pleural space with recurrent malignant effusion.   S/P pleurX 03/11/19 ***  O: There were no vitals filed for this visit. There is no height or weight on file to calculate BMI. On 3L  GEN: middle aged woman in NAD HEENT: no thrush, RRR CV: RRR, ext warm PULM: clear, no wheezing GI: Distended, +small ascites on [place] EXT: Trace edema NEURO: Moves all 4 ext to command PSYCH: AOx3, excellent insight  SKIN: deferred site check today  A: # Recurrent malignant R effusion s/p PleurX 12/1 # Progressive metastatic breast cancer on letrozole,Ibrance, and faslodex now on hospice # Pain due to bone and nerve infiltration of tumor   P: - ***  Erskine Emery MD

## 2019-09-04 ENCOUNTER — Other Ambulatory Visit: Payer: 59

## 2019-09-09 DEATH — deceased

## 2019-11-17 ENCOUNTER — Ambulatory Visit: Payer: 59 | Admitting: Hematology and Oncology

## 2020-08-31 IMAGING — DX DG CHEST 1V PORT
1 series · 1 of 1 positions shown · non-contrast
Comparison: Same day.

CLINICAL DATA: Chest pain after thoracentesis.

EXAM:
PORTABLE CHEST 1 VIEW

[chest ap]
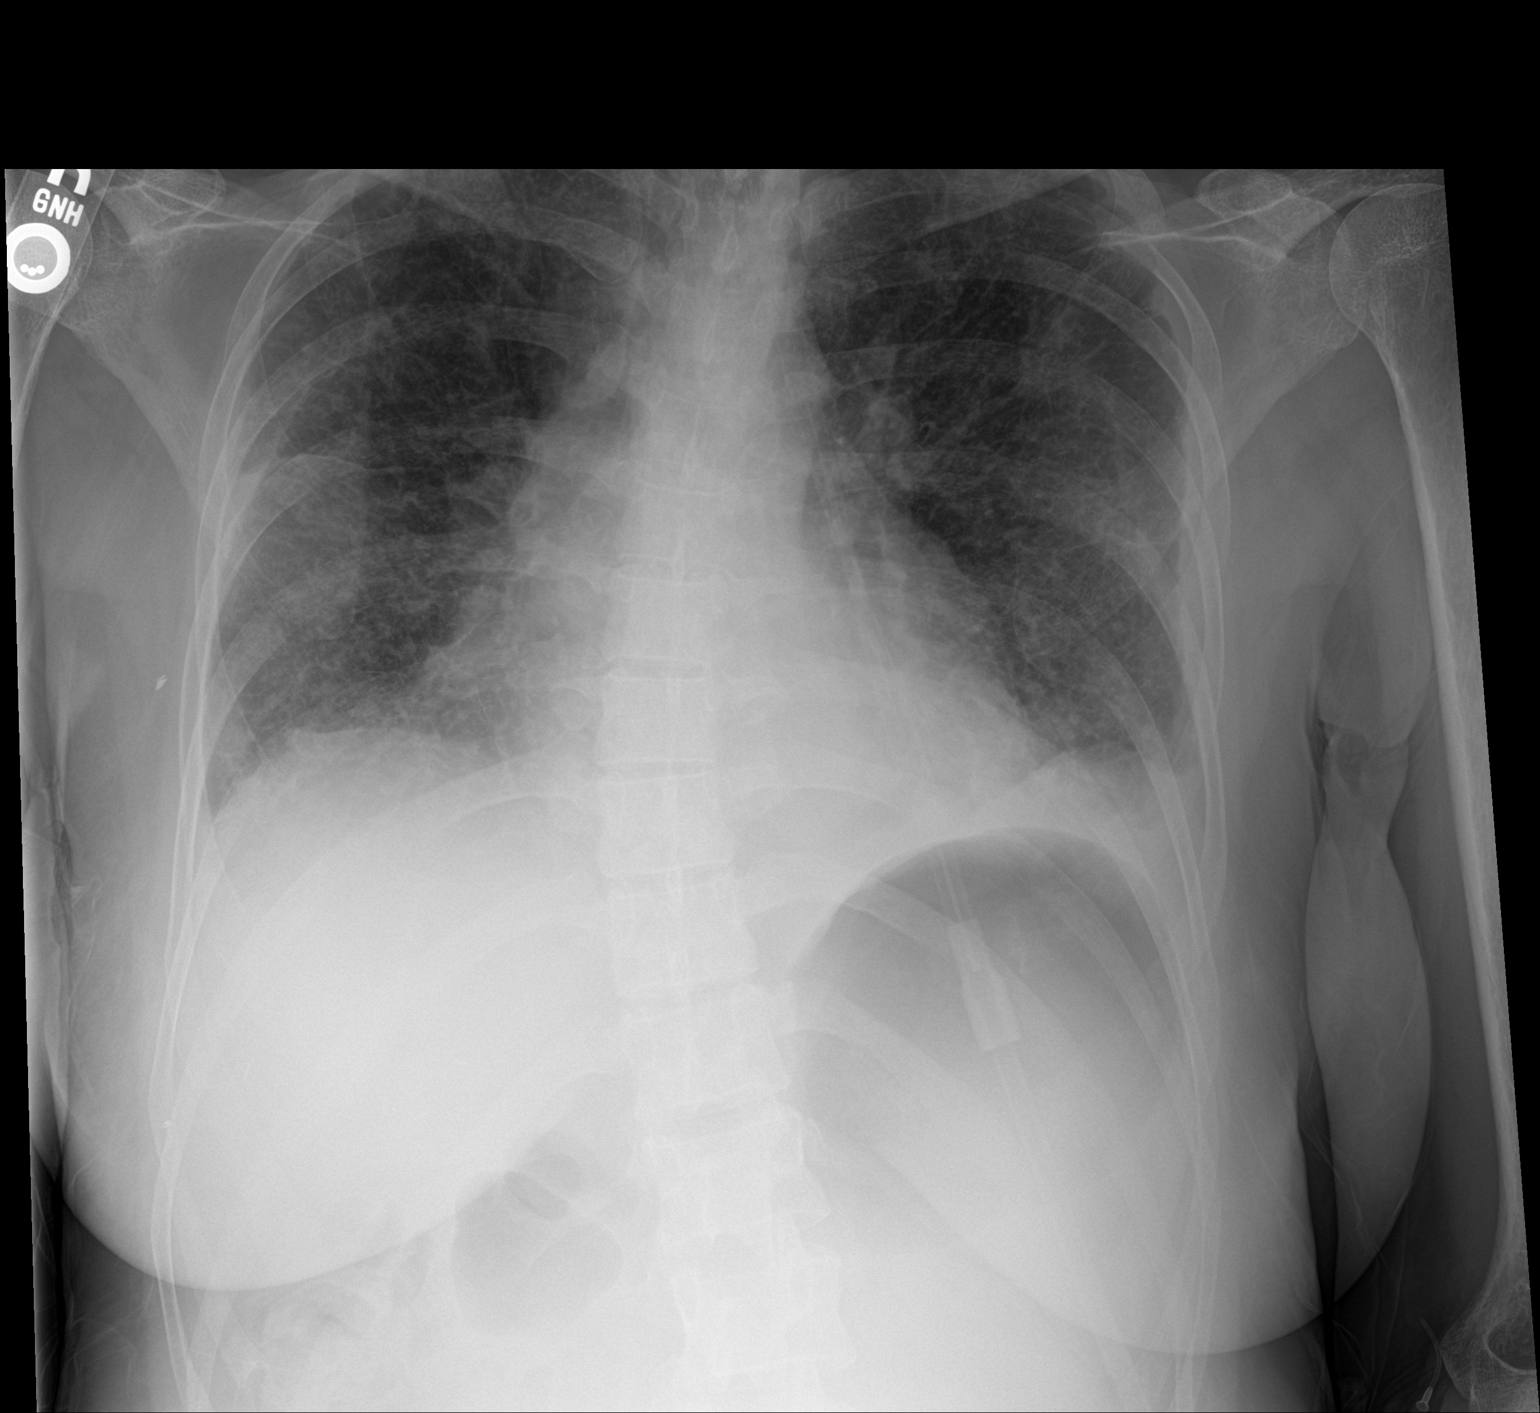

[1 of 1 positions shown; findings below may reference images not displayed]

FINDINGS: Stable mild cardiomegaly. No pneumothorax is noted. Stable diffuse
interstitial densities are noted throughout both lungs. Small
pleural effusions are noted. Stable thoracic scoliosis.
IMPRESSION: Stable mild diffuse interstitial densities throughout both lungs.
Small bilateral pleural effusions are noted. No pneumothorax is
noted.

## 2020-08-31 IMAGING — US US THORACENTESIS ASP PLEURAL SPACE W/IMG GUIDE
1 series · 6 of 6 positions shown · non-contrast
Comparison: none

INDICATION: History of breast cancer. Recurrent right pleural effusion. Request
for therapeutic thoracentesis.

[Series 1: us thoracentesis asp pleural space w/img guide · 6 of 6 slices shown]
[im 1/6]
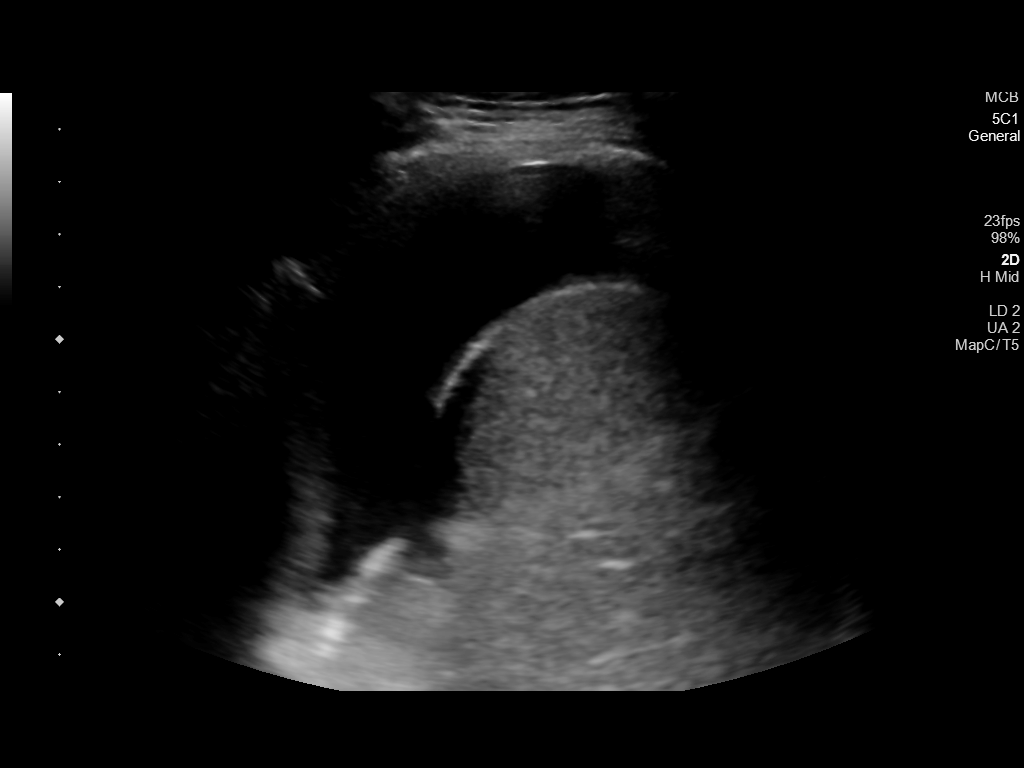
[im 2/6]
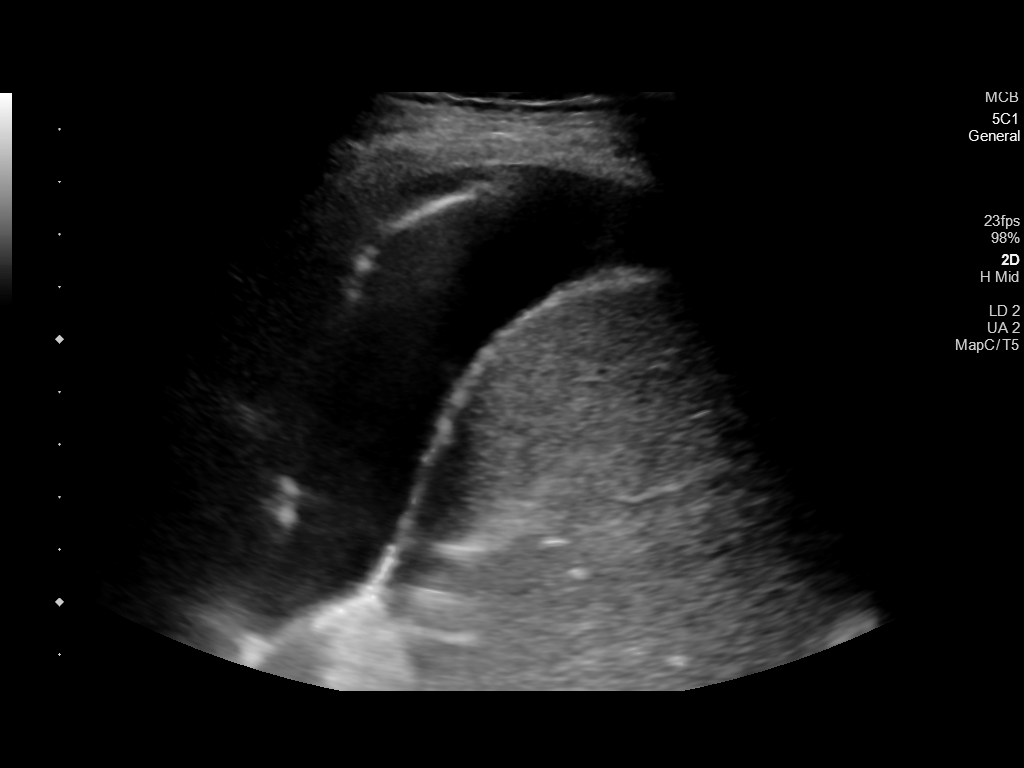
[im 3/6]
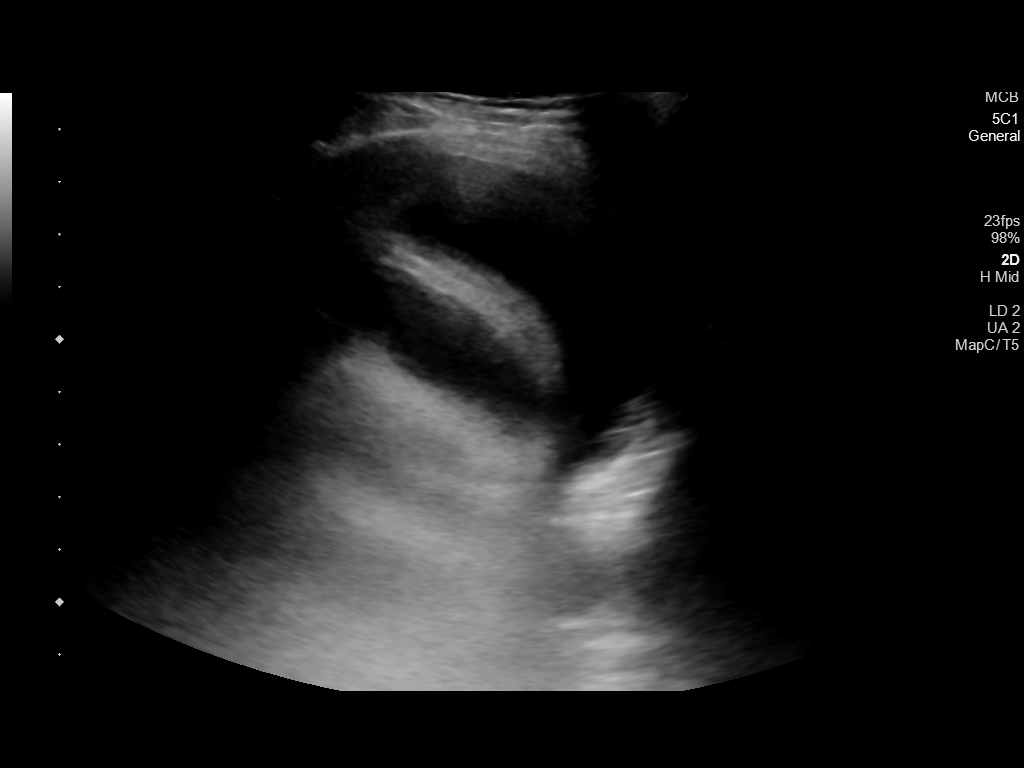
[im 4/6]
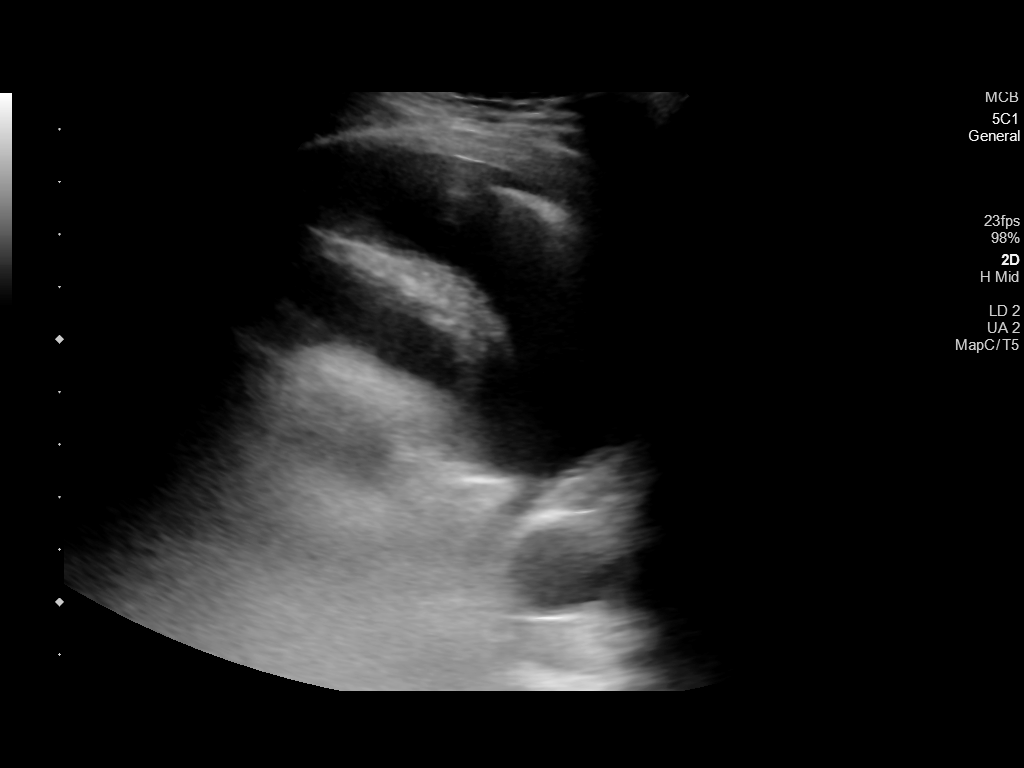
[im 5/6]
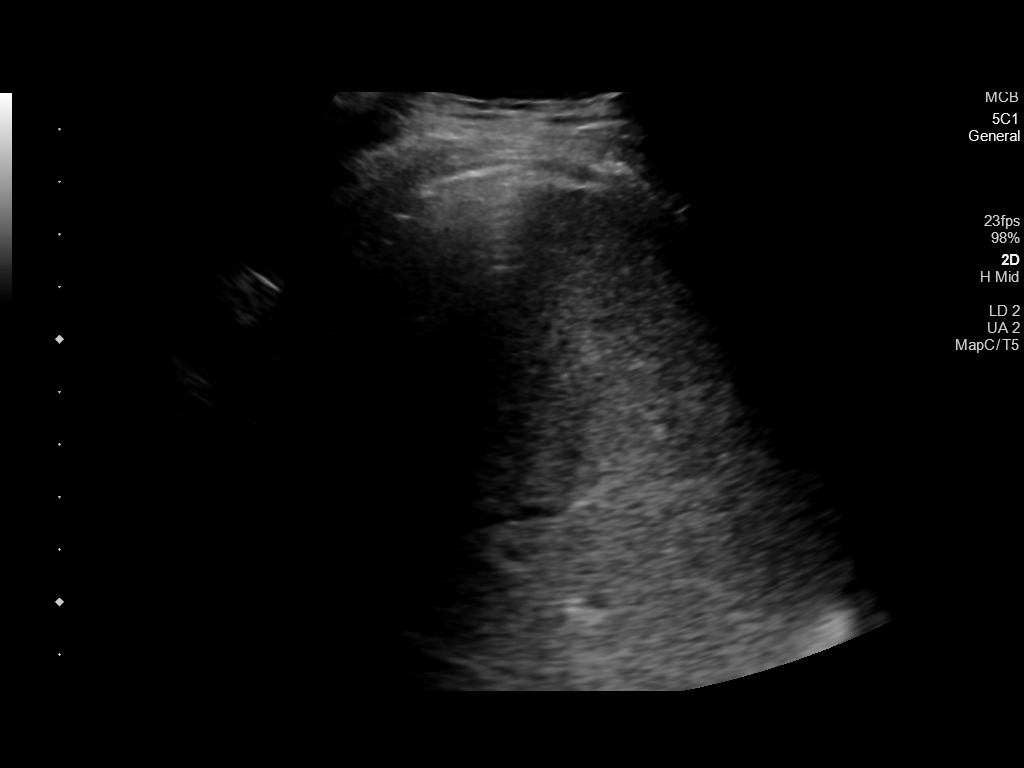
[im 6/6]
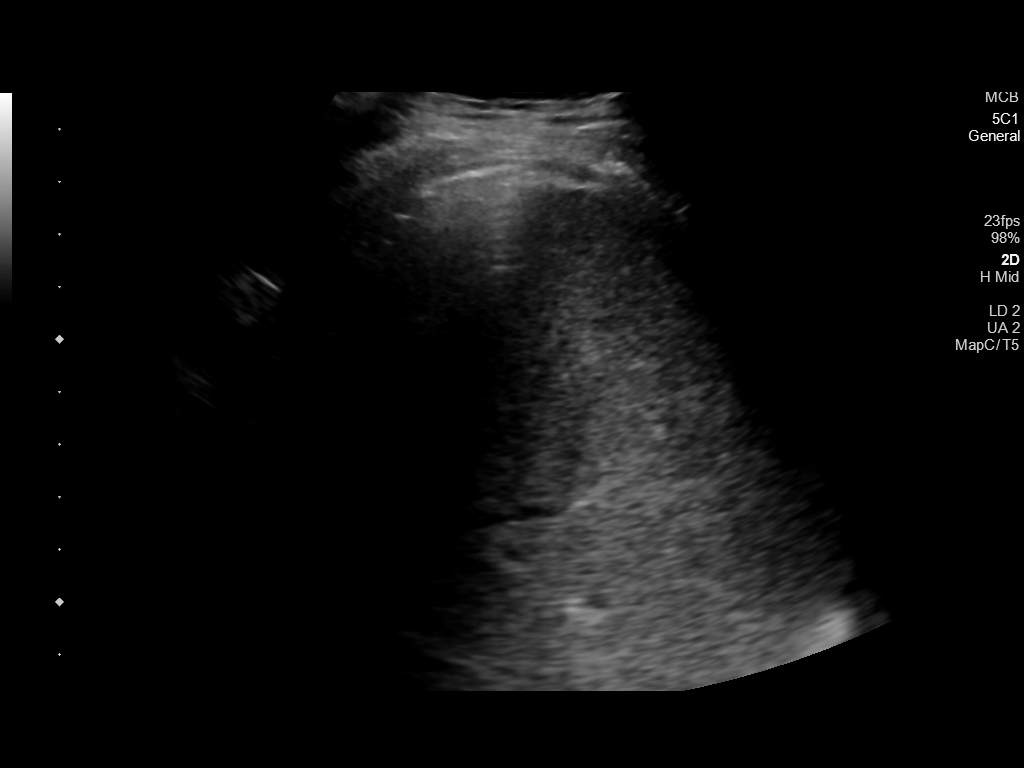

[6 of 6 positions shown; findings below may reference images not displayed]

EXAM:
ULTRASOUND GUIDED RIGHT THORACENTESIS

MEDICATIONS:
None.

COMPLICATIONS:
None immediate. Postprocedural chest x-ray negative for
pneumothorax.

PROCEDURE:
An ultrasound guided thoracentesis was thoroughly discussed with the
patient and questions answered. The benefits, risks, alternatives
and complications were also discussed. The patient understands and
wishes to proceed with the procedure. Written consent was obtained.

Ultrasound was performed to localize and mark an adequate pocket of
fluid in the right chest. The area was then prepped and draped in
the normal sterile fashion. 1% Lidocaine was used for local
anesthesia. Under ultrasound guidance a 6 Fr Safe-T-Centesis
catheter was introduced. Thoracentesis was performed. The catheter
was removed and a dressing applied.
FINDINGS: A total of approximately 500 mL of clear, amber colored fluid was
removed.
IMPRESSION: Successful ultrasound guided right thoracentesis yielding 500 mL of
pleural fluid.

## 2020-10-03 IMAGING — DX DG CHEST 1V PORT
1 series · 1 of 1 positions shown · non-contrast
Comparison: Chest x-rays dated 03/03/2019 and 02/20/2019. Chest CT
dated 02/06/2019 [place]

CLINICAL DATA: Status post chest tube placement.

EXAM:
PORTABLE CHEST 1 VIEW

[chest ap]
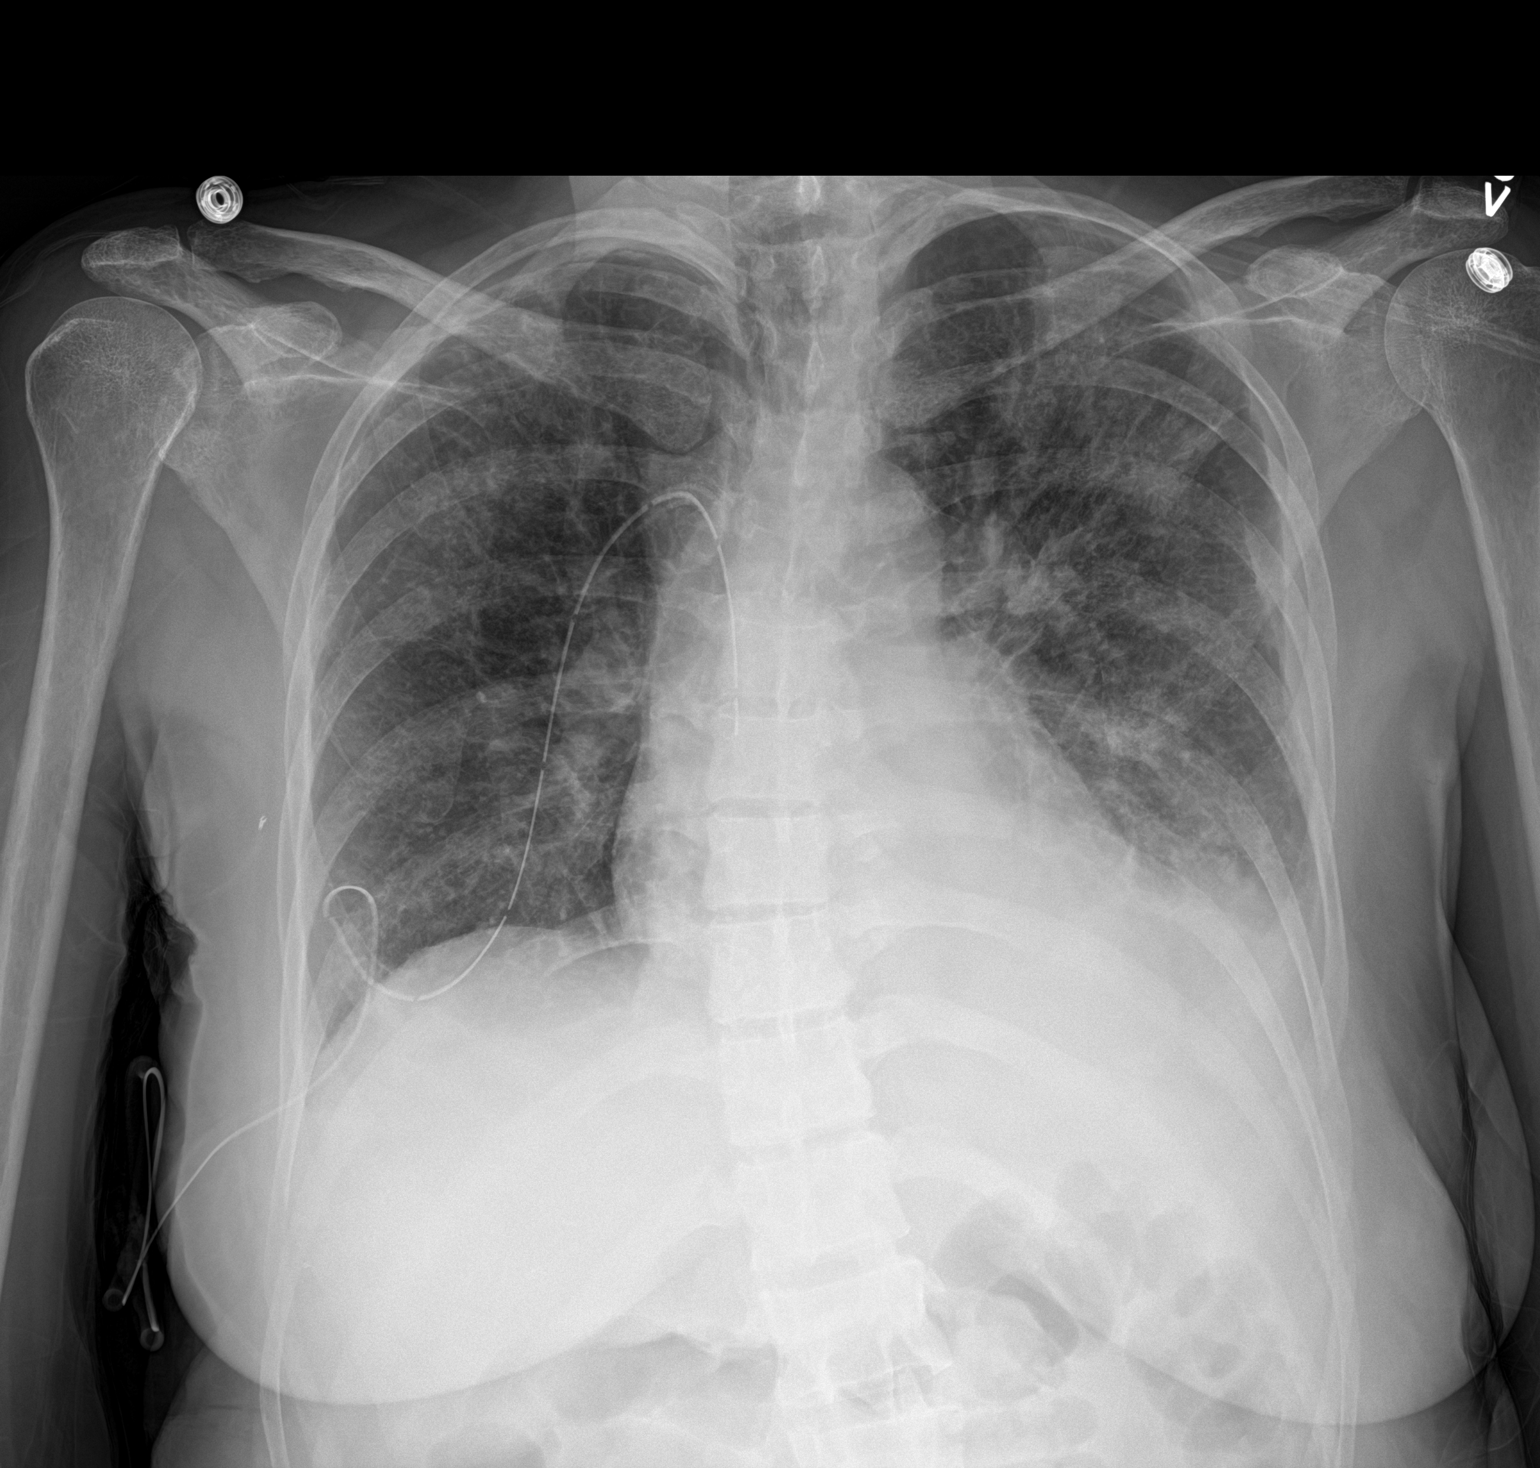

[1 of 1 positions shown; findings below may reference images not displayed]

FINDINGS: RIGHT-sided chest tube in place with tip directed towards the RIGHT
hilum. Patchy bilateral airspace opacities, LEFT slightly greater
than RIGHT. Probable small LEFT pleural effusion. No pneumothorax is
seen.

Heart size and mediastinal contours are within normal limits.
Osseous structures about the chest are unremarkable.
IMPRESSION: 1. RIGHT chest tube in place with tip directed towards the RIGHT
hilum. No pneumothorax seen.
2. Patchy bilateral airspace opacities, LEFT slightly greater than
RIGHT, not significantly changed compared to previous exams,
suspected lymphangitic spread of tumor as previously suggested.
3. Probable small LEFT pleural effusion.

## 2020-10-31 IMAGING — CT CT CHEST W/ CM
2 of 5 series · 12 of 36 positions shown, 15 images · IV contrast (APPLIED)
Comparison: Chest CTA 02/03/2019. CT the abdomen and pelvis
11/19/2016.

CLINICAL DATA: 53-year-old female with history of invasive breast
cancer. Evaluate for metastatic disease.

EXAM:
CT CHEST, ABDOMEN, AND PELVIS WITH CONTRAST
TECHNIQUE: Multidetector CT imaging of the chest, abdomen and pelvis was
performed following the standard protocol during bolus
administration of intravenous contrast.
CONTRAST:  100mL OMNIPAQUE IOHEXOL 300 MG/ML  SOLN

[Series 2: cap with · axial · 0.64mm/px · z∈[-572,-82]mm · 9 of 120 slices shown, 12 images]
[im 11/120  mediastinal]
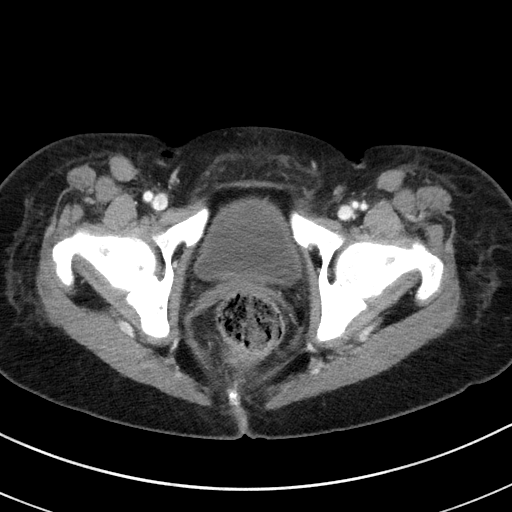
[im 11/120  lung]
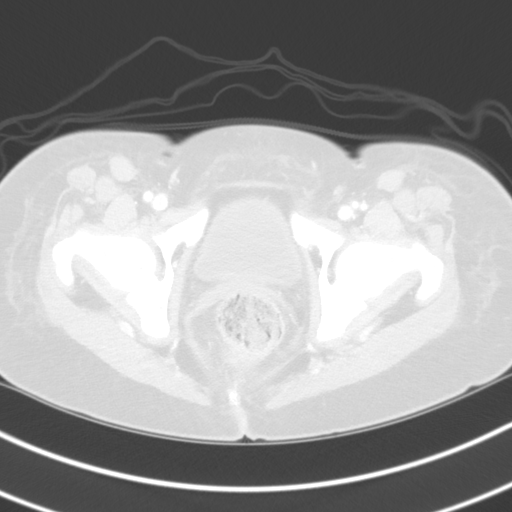
[im 22/120  lung]
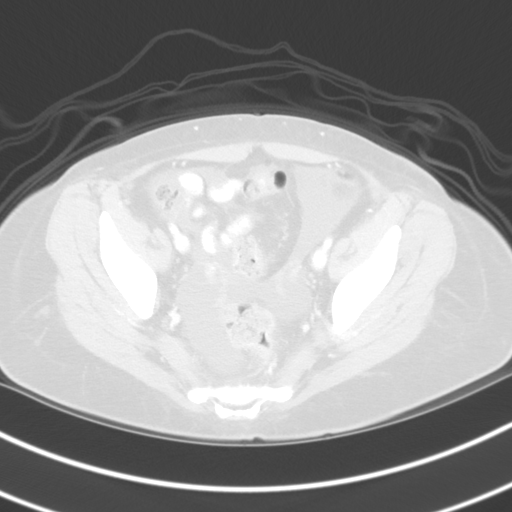
[im 33/120  lung]
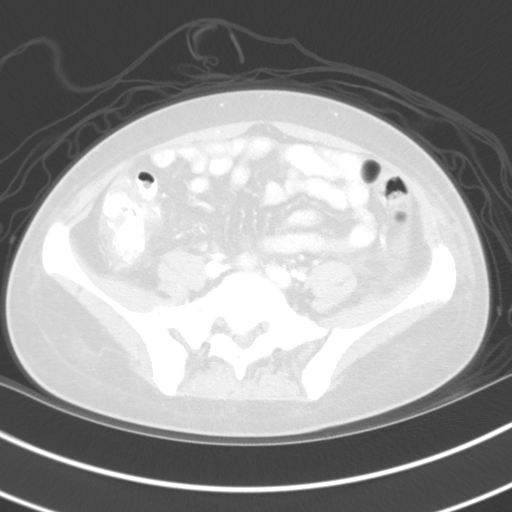
[im 44/120  lung]
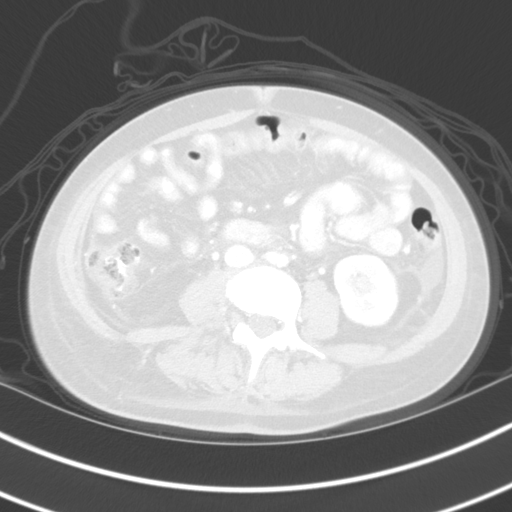
[im 65/120  mediastinal]
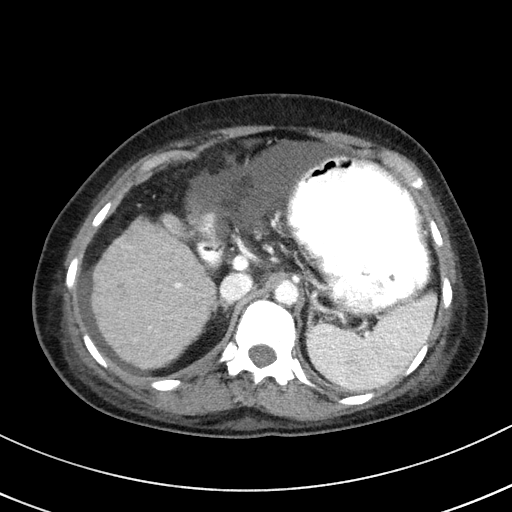
[im 65/120  lung]
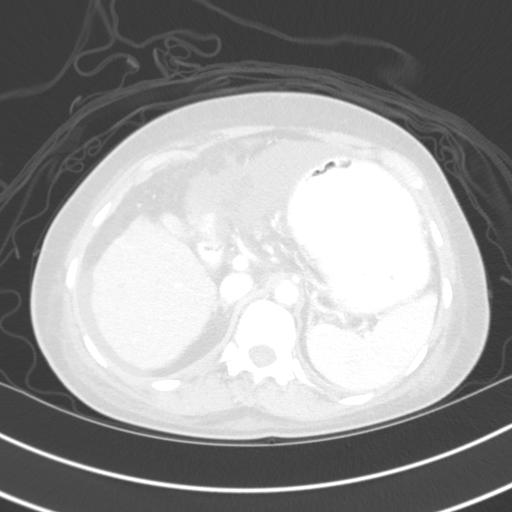
[im 76/120  lung]
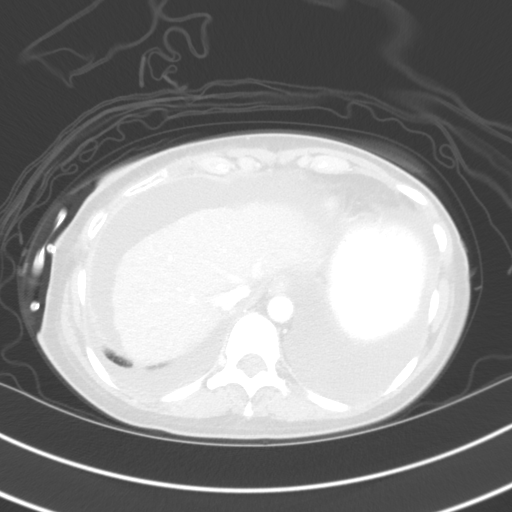
[im 87/120  lung]
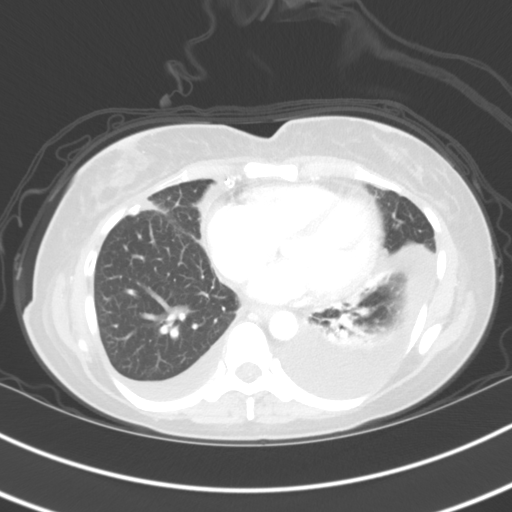
[im 98/120  lung]
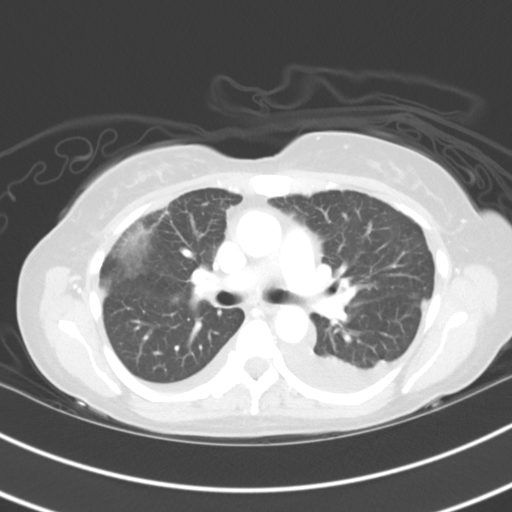
[im 109/120  mediastinal]
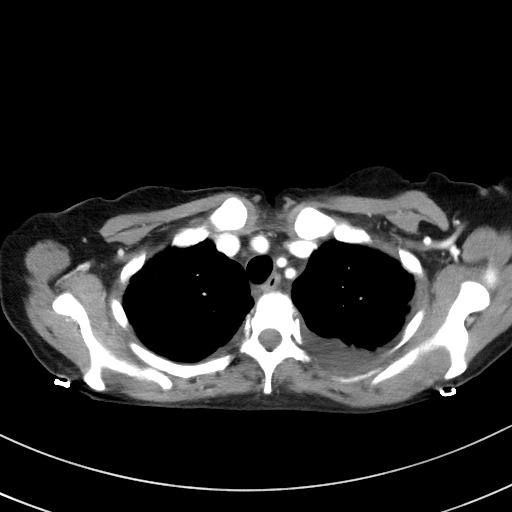
[im 109/120  lung]
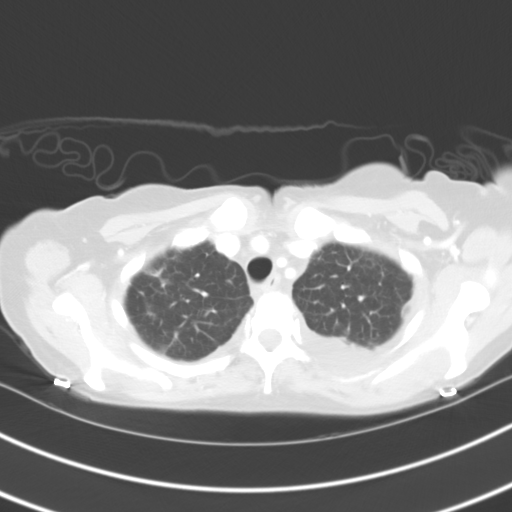

[Series 5: coronals · coronal · 0.71mm/px · 3 of 114 slices shown]
[im 23/114  lung]
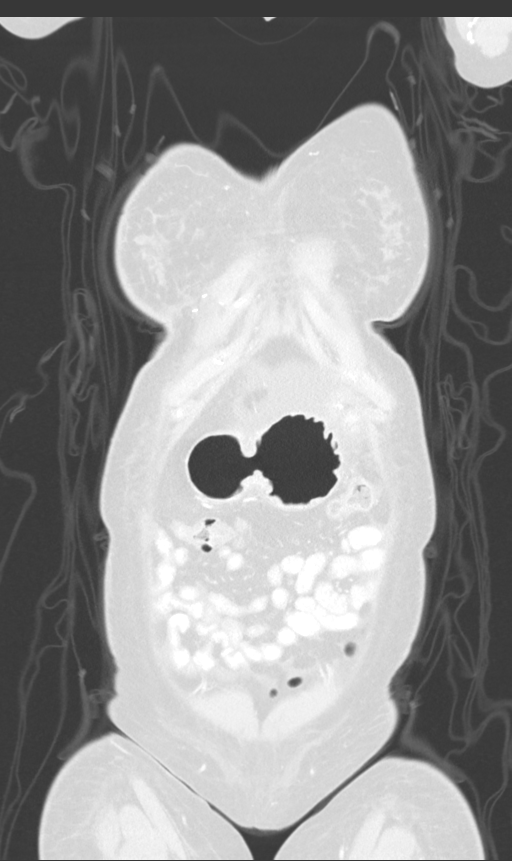
[im 46/114  lung]
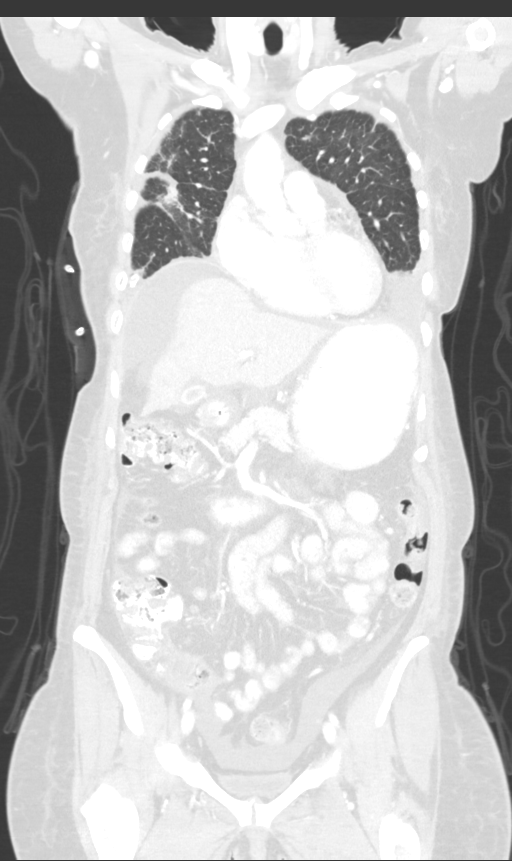
[im 68/114  lung]
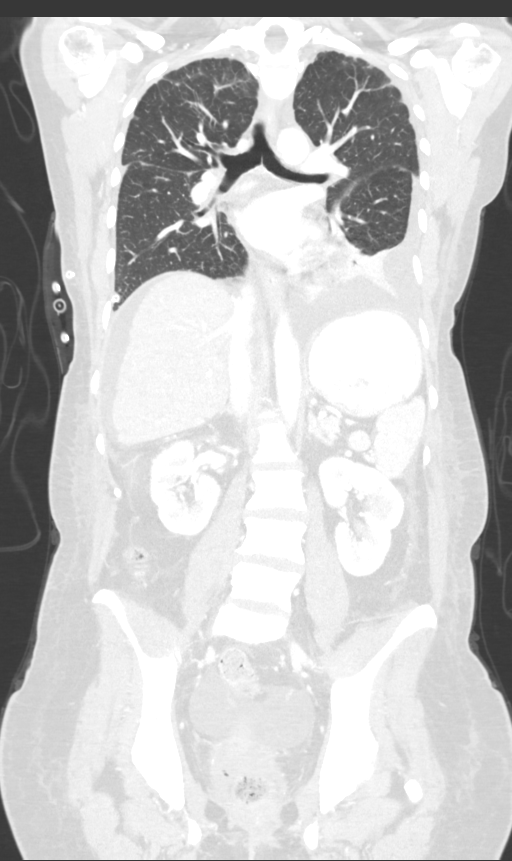

[12 of 36 positions shown; findings below may reference images not displayed]

FINDINGS: CT CHEST FINDINGS

Cardiovascular: Heart size is normal. There is no significant
pericardial fluid, thickening or pericardial calcification. No
atherosclerotic calcifications in the thoracic aorta or the coronary
arteries.

Mediastinum/Nodes: No pathologically enlarged mediastinal, internal
mammary or hilar lymph nodes. Esophagus is unremarkable in
appearance. No axillary lymphadenopathy. Surgical clips in the right
axilla, presumably from prior lymph node dissection.

Lungs/Pleura: Right-sided tunneled pleural drainage catheter in
position with tip in the medial aspect of the lower right
hemithorax. Small right pleural effusion lying dependently. Small to
moderate left pleural effusion predominantly lying dependently.
x 1.2 cm right middle lobe nodule (axial image 67 of series 4).
There continues to be areas of septal thickening and micro
nodularity predominantly in the upper lobes of the lungs bilaterally
near the apices, which previously demonstrated some hypermetabolism
on prior PET-CT. The extent of reticulonodular disease in these
regions appears slightly improved compared to the prior PET-CT.

Musculoskeletal: Diffuse predominantly sclerotic lesions throughout
all aspects of the visualized axial and appendicular skeleton,
compatible with widespread metastatic disease to the bones.

CT ABDOMEN PELVIS FINDINGS

Hepatobiliary: Innumerable ill-defined subcentimeter hypovascular
nodular lesions throughout the liver, too small to characterize.
Liver has a shrunken appearance and nodular contour. No intra or
extrahepatic biliary ductal dilatation. Gallbladder is nearly
completely decompressed.

Pancreas: No pancreatic mass. No pancreatic ductal dilatation. No
pancreatic or peripancreatic fluid collections or inflammatory
changes.

Spleen: Unremarkable.

Adrenals/Urinary Tract: 3 mm nonobstructive calculus in the
interpolar collecting system of the left kidney. No suspicious renal
lesions. No hydroureteronephrosis. Bilateral adrenal glands are
normal in appearance. Urinary bladder is nearly decompressed, but
otherwise unremarkable in appearance.

Stomach/Bowel: Normal appearance of the stomach. No pathologic
dilatation of small bowel or colon. Normal appendix.

Vascular/Lymphatic: Aortic atherosclerosis, without evidence of
aneurysm or dissection in the abdominal or pelvic vasculature. No
lymphadenopathy noted in the abdomen or pelvis.

Reproductive: Uterus and ovaries are unremarkable in appearance.

Other: Small volume of ascites.  No pneumoperitoneum.

Musculoskeletal: Diffuse predominantly sclerotic lesions noted
throughout the visualized axial and appendicular skeleton,
compatible with widespread metastatic disease to the bones.
IMPRESSION: 1. Overall, today's study demonstrates probable positive response to
therapy. Specifically, there has been regression of the
hypermetabolic mass-like area in the right middle lobe which is
currently a smaller nodule, slight regression of apparent
lymphangitic spread of tumor in the upper lungs bilaterally, the
liver now has a shrunken appearance and nodular contour in the
presence of innumerable subcentimeter poorly defined hypovascular
lesions which likely reflects evolution of treated metastatic
disease to the liver, and widespread metastatic disease to the bones
is similar to the prior examination. No new metastatic sites are
confidently identified.
2. Small right and small to moderate left pleural effusions with
right-sided tunneled pleural drainage catheter in position.
3. Aortic atherosclerosis.
4. Small volume of ascites.
5. 3 mm nonobstructive calculus in the interpolar collecting system
of the left kidney.
